# Patient Record
Sex: Female | Born: 1965 | Race: Black or African American | Hispanic: No | Marital: Single | Smoking: Never smoker
Health system: Southern US, Community
[De-identification: ages and names within clinical notes are randomized; demographics above are authoritative.]

## PROBLEM LIST (undated history)

## (undated) DIAGNOSIS — J80 Acute respiratory distress syndrome: Secondary | ICD-10-CM

## (undated) DIAGNOSIS — L8990 Pressure ulcer of unspecified site, unspecified stage: Secondary | ICD-10-CM

## (undated) DIAGNOSIS — Z930 Tracheostomy status: Secondary | ICD-10-CM

## (undated) DIAGNOSIS — Z789 Other specified health status: Secondary | ICD-10-CM

## (undated) DIAGNOSIS — J9601 Acute respiratory failure with hypoxia: Secondary | ICD-10-CM

## (undated) HISTORY — DX: Other specified health status: Z78.9

## (undated) HISTORY — DX: Tracheostomy status: Z93.0

## (undated) HISTORY — DX: Acute respiratory failure with hypoxia: J96.01

## (undated) HISTORY — PX: TRACHEOSTOMY: SUR1362

## (undated) HISTORY — DX: Acute respiratory distress syndrome: J80

---
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
## 2018-10-22 ENCOUNTER — Emergency Department (HOSPITAL_BASED_OUTPATIENT_CLINIC_OR_DEPARTMENT_OTHER): Payer: HRSA Program

## 2018-10-22 ENCOUNTER — Other Ambulatory Visit: Payer: Self-pay

## 2018-10-22 ENCOUNTER — Inpatient Hospital Stay (HOSPITAL_BASED_OUTPATIENT_CLINIC_OR_DEPARTMENT_OTHER)
Admission: EM | Admit: 2018-10-22 | Discharge: 2018-12-09 | DRG: 004 | Disposition: A | Discharge status: Home Health | Payer: HRSA Program | Attending: Internal Medicine | Admitting: Internal Medicine

## 2018-10-22 ENCOUNTER — Encounter (HOSPITAL_BASED_OUTPATIENT_CLINIC_OR_DEPARTMENT_OTHER): Payer: Self-pay

## 2018-10-22 ENCOUNTER — Inpatient Hospital Stay (HOSPITAL_BASED_OUTPATIENT_CLINIC_OR_DEPARTMENT_OTHER): Payer: HRSA Program

## 2018-10-22 DIAGNOSIS — E876 Hypokalemia: Secondary | ICD-10-CM | POA: Diagnosis not present

## 2018-10-22 DIAGNOSIS — E870 Hyperosmolality and hypernatremia: Secondary | ICD-10-CM | POA: Diagnosis not present

## 2018-10-22 DIAGNOSIS — R7989 Other specified abnormal findings of blood chemistry: Secondary | ICD-10-CM | POA: Diagnosis present

## 2018-10-22 DIAGNOSIS — Z95828 Presence of other vascular implants and grafts: Secondary | ICD-10-CM

## 2018-10-22 DIAGNOSIS — R680 Hypothermia, not associated with low environmental temperature: Secondary | ICD-10-CM | POA: Diagnosis not present

## 2018-10-22 DIAGNOSIS — R509 Fever, unspecified: Secondary | ICD-10-CM | POA: Diagnosis not present

## 2018-10-22 DIAGNOSIS — Z930 Tracheostomy status: Secondary | ICD-10-CM | POA: Diagnosis not present

## 2018-10-22 DIAGNOSIS — Z6834 Body mass index (BMI) 34.0-34.9, adult: Secondary | ICD-10-CM

## 2018-10-22 DIAGNOSIS — J80 Acute respiratory distress syndrome: Secondary | ICD-10-CM | POA: Diagnosis not present

## 2018-10-22 DIAGNOSIS — T380X5A Adverse effect of glucocorticoids and synthetic analogues, initial encounter: Secondary | ICD-10-CM | POA: Diagnosis not present

## 2018-10-22 DIAGNOSIS — A419 Sepsis, unspecified organism: Secondary | ICD-10-CM | POA: Diagnosis not present

## 2018-10-22 DIAGNOSIS — G92 Toxic encephalopathy: Secondary | ICD-10-CM | POA: Diagnosis not present

## 2018-10-22 DIAGNOSIS — R451 Restlessness and agitation: Secondary | ICD-10-CM | POA: Diagnosis not present

## 2018-10-22 DIAGNOSIS — Z430 Encounter for attention to tracheostomy: Secondary | ICD-10-CM | POA: Diagnosis not present

## 2018-10-22 DIAGNOSIS — Y92238 Other place in hospital as the place of occurrence of the external cause: Secondary | ICD-10-CM | POA: Diagnosis not present

## 2018-10-22 DIAGNOSIS — D72829 Elevated white blood cell count, unspecified: Secondary | ICD-10-CM | POA: Diagnosis not present

## 2018-10-22 DIAGNOSIS — U071 COVID-19: Secondary | ICD-10-CM

## 2018-10-22 DIAGNOSIS — R140 Abdominal distension (gaseous): Secondary | ICD-10-CM | POA: Diagnosis not present

## 2018-10-22 DIAGNOSIS — Y848 Other medical procedures as the cause of abnormal reaction of the patient, or of later complication, without mention of misadventure at the time of the procedure: Secondary | ICD-10-CM | POA: Diagnosis not present

## 2018-10-22 DIAGNOSIS — J9600 Acute respiratory failure, unspecified whether with hypoxia or hypercapnia: Secondary | ICD-10-CM

## 2018-10-22 DIAGNOSIS — R1110 Vomiting, unspecified: Secondary | ICD-10-CM

## 2018-10-22 DIAGNOSIS — A4189 Other specified sepsis: Secondary | ICD-10-CM | POA: Diagnosis present

## 2018-10-22 DIAGNOSIS — G9340 Encephalopathy, unspecified: Secondary | ICD-10-CM | POA: Diagnosis not present

## 2018-10-22 DIAGNOSIS — K5900 Constipation, unspecified: Secondary | ICD-10-CM | POA: Diagnosis not present

## 2018-10-22 DIAGNOSIS — Z9289 Personal history of other medical treatment: Secondary | ICD-10-CM

## 2018-10-22 DIAGNOSIS — J9601 Acute respiratory failure with hypoxia: Secondary | ICD-10-CM

## 2018-10-22 DIAGNOSIS — R739 Hyperglycemia, unspecified: Secondary | ICD-10-CM | POA: Diagnosis not present

## 2018-10-22 DIAGNOSIS — R1312 Dysphagia, oropharyngeal phase: Secondary | ICD-10-CM | POA: Diagnosis not present

## 2018-10-22 DIAGNOSIS — J069 Acute upper respiratory infection, unspecified: Secondary | ICD-10-CM

## 2018-10-22 DIAGNOSIS — I9589 Other hypotension: Secondary | ICD-10-CM | POA: Diagnosis not present

## 2018-10-22 DIAGNOSIS — I952 Hypotension due to drugs: Secondary | ICD-10-CM | POA: Diagnosis not present

## 2018-10-22 DIAGNOSIS — R5381 Other malaise: Secondary | ICD-10-CM | POA: Diagnosis not present

## 2018-10-22 DIAGNOSIS — Z978 Presence of other specified devices: Secondary | ICD-10-CM

## 2018-10-22 DIAGNOSIS — R1310 Dysphagia, unspecified: Secondary | ICD-10-CM

## 2018-10-22 DIAGNOSIS — I480 Paroxysmal atrial fibrillation: Secondary | ICD-10-CM | POA: Diagnosis not present

## 2018-10-22 DIAGNOSIS — J1289 Other viral pneumonia: Secondary | ICD-10-CM | POA: Diagnosis present

## 2018-10-22 DIAGNOSIS — J9501 Hemorrhage from tracheostomy stoma: Secondary | ICD-10-CM | POA: Diagnosis not present

## 2018-10-22 DIAGNOSIS — D696 Thrombocytopenia, unspecified: Secondary | ICD-10-CM | POA: Diagnosis not present

## 2018-10-22 DIAGNOSIS — I959 Hypotension, unspecified: Secondary | ICD-10-CM | POA: Diagnosis not present

## 2018-10-22 DIAGNOSIS — R0902 Hypoxemia: Secondary | ICD-10-CM

## 2018-10-22 DIAGNOSIS — J1282 Pneumonia due to coronavirus disease 2019: Secondary | ICD-10-CM

## 2018-10-22 DIAGNOSIS — R945 Abnormal results of liver function studies: Secondary | ICD-10-CM | POA: Diagnosis not present

## 2018-10-22 DIAGNOSIS — Z4659 Encounter for fitting and adjustment of other gastrointestinal appliance and device: Secondary | ICD-10-CM

## 2018-10-22 DIAGNOSIS — S01512A Laceration without foreign body of oral cavity, initial encounter: Secondary | ICD-10-CM | POA: Diagnosis not present

## 2018-10-22 DIAGNOSIS — L8990 Pressure ulcer of unspecified site, unspecified stage: Secondary | ICD-10-CM

## 2018-10-22 DIAGNOSIS — G9341 Metabolic encephalopathy: Secondary | ICD-10-CM | POA: Diagnosis not present

## 2018-10-22 DIAGNOSIS — E669 Obesity, unspecified: Secondary | ICD-10-CM | POA: Diagnosis present

## 2018-10-22 DIAGNOSIS — R6520 Severe sepsis without septic shock: Secondary | ICD-10-CM | POA: Diagnosis not present

## 2018-10-22 DIAGNOSIS — E871 Hypo-osmolality and hyponatremia: Secondary | ICD-10-CM | POA: Diagnosis not present

## 2018-10-22 DIAGNOSIS — J988 Other specified respiratory disorders: Secondary | ICD-10-CM | POA: Diagnosis not present

## 2018-10-22 DIAGNOSIS — T4275XA Adverse effect of unspecified antiepileptic and sedative-hypnotic drugs, initial encounter: Secondary | ICD-10-CM | POA: Diagnosis not present

## 2018-10-22 DIAGNOSIS — R9431 Abnormal electrocardiogram [ECG] [EKG]: Secondary | ICD-10-CM | POA: Diagnosis not present

## 2018-10-22 DIAGNOSIS — Z452 Encounter for adjustment and management of vascular access device: Secondary | ICD-10-CM

## 2018-10-22 DIAGNOSIS — J151 Pneumonia due to Pseudomonas: Secondary | ICD-10-CM | POA: Diagnosis not present

## 2018-10-22 DIAGNOSIS — T17908A Unspecified foreign body in respiratory tract, part unspecified causing other injury, initial encounter: Secondary | ICD-10-CM

## 2018-10-22 HISTORY — DX: COVID-19: U07.1

## 2018-10-22 LAB — CBC WITH DIFFERENTIAL/PLATELET
Abs Immature Granulocytes: 0.25 10*3/uL — ABNORMAL HIGH (ref 0.00–0.07)
Basophils Absolute: 0 10*3/uL (ref 0.0–0.1)
Basophils Relative: 0 %
Eosinophils Absolute: 0 10*3/uL (ref 0.0–0.5)
Eosinophils Relative: 0 %
HCT: 45.5 % (ref 36.0–46.0)
Hemoglobin: 13.7 g/dL (ref 12.0–15.0)
Immature Granulocytes: 2 %
Lymphocytes Relative: 7 %
Lymphs Abs: 1.2 10*3/uL (ref 0.7–4.0)
MCH: 27.4 pg (ref 26.0–34.0)
MCHC: 30.1 g/dL (ref 30.0–36.0)
MCV: 91 fL (ref 80.0–100.0)
Monocytes Absolute: 0.8 10*3/uL (ref 0.1–1.0)
Monocytes Relative: 5 %
Neutro Abs: 14.2 10*3/uL — ABNORMAL HIGH (ref 1.7–7.7)
Neutrophils Relative %: 86 %
Platelets: 256 10*3/uL (ref 150–400)
RBC: 5 MIL/uL (ref 3.87–5.11)
RDW: 15.3 % (ref 11.5–15.5)
WBC: 16.4 10*3/uL — ABNORMAL HIGH (ref 4.0–10.5)
nRBC: 0 % (ref 0.0–0.2)

## 2018-10-22 LAB — POCT I-STAT 7, (LYTES, BLD GAS, ICA,H+H)
Acid-Base Excess: 4 mmol/L — ABNORMAL HIGH (ref 0.0–2.0)
Bicarbonate: 27 mmol/L (ref 20.0–28.0)
Calcium, Ion: 1.11 mmol/L — ABNORMAL LOW (ref 1.15–1.40)
HCT: 40 % (ref 36.0–46.0)
Hemoglobin: 13.6 g/dL (ref 12.0–15.0)
O2 Saturation: 100 %
Potassium: 3.5 mmol/L (ref 3.5–5.1)
Sodium: 137 mmol/L (ref 135–145)
TCO2: 28 mmol/L (ref 22–32)
pCO2 arterial: 36.1 mmHg (ref 32.0–48.0)
pH, Arterial: 7.482 — ABNORMAL HIGH (ref 7.350–7.450)
pO2, Arterial: 246 mmHg — ABNORMAL HIGH (ref 83.0–108.0)

## 2018-10-22 LAB — URINALYSIS, ROUTINE W REFLEX MICROSCOPIC
Glucose, UA: NEGATIVE mg/dL
Ketones, ur: NEGATIVE mg/dL
Leukocytes,Ua: NEGATIVE
Nitrite: NEGATIVE
Protein, ur: 100 mg/dL — AB
Specific Gravity, Urine: >1.03 — ABNORMAL HIGH (ref 1.005–1.030)
pH: 5.5 (ref 5.0–8.0)

## 2018-10-22 LAB — URINALYSIS, MICROSCOPIC (REFLEX)

## 2018-10-22 LAB — COMPREHENSIVE METABOLIC PANEL
ALT: 58 U/L — ABNORMAL HIGH (ref 0–44)
AST: 112 U/L — ABNORMAL HIGH (ref 15–41)
Albumin: 3.2 g/dL — ABNORMAL LOW (ref 3.5–5.0)
Alkaline Phosphatase: 203 U/L — ABNORMAL HIGH (ref 38–126)
Anion gap: 11 (ref 5–15)
BUN: 23 mg/dL — ABNORMAL HIGH (ref 6–20)
CO2: 26 mmol/L (ref 22–32)
Calcium: 8.7 mg/dL — ABNORMAL LOW (ref 8.9–10.3)
Chloride: 98 mmol/L (ref 98–111)
Creatinine, Ser: 0.8 mg/dL (ref 0.44–1.00)
GFR calc Af Amer: >60 mL/min (ref >60)
GFR calc non Af Amer: >60 mL/min (ref >60)
Glucose, Bld: 156 mg/dL — ABNORMAL HIGH (ref 70–99)
Potassium: 3.8 mmol/L (ref 3.5–5.1)
Sodium: 135 mmol/L (ref 135–145)
Total Bilirubin: 0.5 mg/dL (ref 0.3–1.2)
Total Protein: 8.4 g/dL — ABNORMAL HIGH (ref 6.5–8.1)

## 2018-10-22 LAB — SARS CORONAVIRUS 2 AG (30 MIN TAT): SARS Coronavirus 2 Ag: POSITIVE — AB

## 2018-10-22 LAB — PREGNANCY, URINE: Preg Test, Ur: NEGATIVE

## 2018-10-22 LAB — TROPONIN I: Troponin I: 0.04 ng/mL (ref <0.03)

## 2018-10-22 LAB — LIPASE, BLOOD: Lipase: 26 U/L (ref 11–51)

## 2018-10-22 MED ORDER — ASPIRIN 300 MG RE SUPP
300.0000 mg | Freq: Every day | RECTAL | Status: DC
  Administered 2018-10-22: 300 mg via RECTAL
  Filled 2018-10-22: qty 1

## 2018-10-22 MED ORDER — MIDAZOLAM HCL 5 MG/5ML IJ SOLN
2.0000 mg | INTRAMUSCULAR | Status: DC | PRN
  Administered 2018-10-22 (×2): 2 mg via INTRAVENOUS

## 2018-10-22 MED ORDER — ETOMIDATE 2 MG/ML IV SOLN
20.0000 mg | Freq: Once | INTRAVENOUS | Status: DC
  Filled 2018-10-22: qty 10

## 2018-10-22 MED ORDER — VITAMIN C 500 MG PO TABS
500.0000 mg | ORAL_TABLET | Freq: Every day | ORAL | Status: DC
  Filled 2018-10-22: qty 1

## 2018-10-22 MED ORDER — FAMOTIDINE 40 MG/5ML PO SUSR
20.0000 mg | Freq: Two times a day (BID) | ORAL | Status: DC
  Administered 2018-10-23 – 2018-11-20 (×58): 20 mg
  Filled 2018-10-22 (×60): qty 2.5

## 2018-10-22 MED ORDER — PROPOFOL 1000 MG/100ML IV EMUL
5.0000 ug/kg/min | INTRAVENOUS | Status: DC
  Administered 2018-10-22: 15 ug/kg/min via INTRAVENOUS
  Administered 2018-10-22: 50 ug/kg/min via INTRAVENOUS
  Administered 2018-10-22: 45 ug/kg/min via INTRAVENOUS
  Filled 2018-10-22: qty 100
  Filled 2018-10-22: qty 200
  Filled 2018-10-22 (×2): qty 100

## 2018-10-22 MED ORDER — SODIUM CHLORIDE 0.9 % IV BOLUS
500.0000 mL | Freq: Once | INTRAVENOUS | Status: AC
  Administered 2018-10-22: 500 mL via INTRAVENOUS

## 2018-10-22 MED ORDER — SODIUM CHLORIDE 0.9% FLUSH
3.0000 mL | Freq: Two times a day (BID) | INTRAVENOUS | Status: DC
  Administered 2018-10-22 – 2018-11-15 (×31): 3 mL via INTRAVENOUS
  Administered 2018-11-15: 10 mL via INTRAVENOUS
  Administered 2018-11-16 – 2018-11-24 (×7): 3 mL via INTRAVENOUS

## 2018-10-22 MED ORDER — ZINC SULFATE 220 (50 ZN) MG PO CAPS
220.0000 mg | ORAL_CAPSULE | Freq: Every day | ORAL | Status: DC
  Filled 2018-10-22: qty 1

## 2018-10-22 MED ORDER — MIDAZOLAM HCL 5 MG/5ML IJ SOLN
INTRAMUSCULAR | Status: AC
  Administered 2018-10-22: 2 mg via INTRAVENOUS
  Filled 2018-10-22: qty 5

## 2018-10-22 MED ORDER — SODIUM CHLORIDE 0.9% FLUSH
3.0000 mL | Freq: Two times a day (BID) | INTRAVENOUS | Status: DC
  Administered 2018-10-22: 3 mL via INTRAVENOUS

## 2018-10-22 MED ORDER — CHLORHEXIDINE GLUCONATE 0.12% ORAL RINSE (MEDLINE KIT)
15.0000 mL | Freq: Two times a day (BID) | OROMUCOSAL | Status: DC
  Administered 2018-10-23 – 2018-10-27 (×10): 15 mL via OROMUCOSAL

## 2018-10-22 MED ORDER — ENOXAPARIN SODIUM 40 MG/0.4ML ~~LOC~~ SOLN
40.0000 mg | Freq: Two times a day (BID) | SUBCUTANEOUS | Status: DC
  Administered 2018-10-22 – 2018-11-12 (×42): 40 mg via SUBCUTANEOUS
  Filled 2018-10-22 (×41): qty 0.4

## 2018-10-22 MED ORDER — ETOMIDATE 2 MG/ML IV SOLN
30.0000 mg | Freq: Once | INTRAVENOUS | Status: AC
  Administered 2018-10-22: 30 mg via INTRAVENOUS

## 2018-10-22 MED ORDER — DOCUSATE SODIUM 50 MG/5ML PO LIQD: 100.0000 mg | Freq: Two times a day (BID) | ORAL | Status: DC | PRN

## 2018-10-22 MED ORDER — SODIUM CHLORIDE 0.9 % IV SOLN
500.0000 mg | INTRAVENOUS | Status: AC
  Administered 2018-10-23 – 2018-10-26 (×5): 500 mg via INTRAVENOUS
  Filled 2018-10-22 (×6): qty 500

## 2018-10-22 MED ORDER — SODIUM CHLORIDE 0.9 % IV SOLN
1.0000 g | INTRAVENOUS | Status: DC
  Administered 2018-10-22 – 2018-10-26 (×5): 1 g via INTRAVENOUS
  Filled 2018-10-22 (×3): qty 1
  Filled 2018-10-22 (×2): qty 10

## 2018-10-22 MED ORDER — BISACODYL 10 MG RE SUPP
10.0000 mg | Freq: Every day | RECTAL | Status: DC | PRN
  Filled 2018-10-22: qty 1

## 2018-10-22 MED ORDER — FENTANYL CITRATE (PF) 100 MCG/2ML IJ SOLN
50.0000 ug | INTRAMUSCULAR | Status: DC | PRN
  Administered 2018-10-26: 10:00:00 50 ug via INTRAVENOUS
  Administered 2018-10-30 – 2018-11-08 (×12): 100 ug via INTRAVENOUS
  Filled 2018-10-22 (×12): qty 2

## 2018-10-22 MED ORDER — PROPOFOL 1000 MG/100ML IV EMUL
0.0000 ug/kg/min | INTRAVENOUS | Status: DC
  Administered 2018-10-23 – 2018-10-24 (×7): 50 ug/kg/min via INTRAVENOUS
  Administered 2018-10-24 – 2018-10-25 (×3): 25 ug/kg/min via INTRAVENOUS
  Administered 2018-10-26: 10 ug/kg/min via INTRAVENOUS
  Administered 2018-10-27: 15 ug/kg/min via INTRAVENOUS
  Administered 2018-10-28: 7.331 ug/kg/min via INTRAVENOUS
  Administered 2018-10-28: 20 ug/kg/min via INTRAVENOUS
  Administered 2018-10-28: 14.663 ug/kg/min via INTRAVENOUS
  Administered 2018-10-29: 5 ug/kg/min via INTRAVENOUS
  Administered 2018-10-29: 23.38 ug/kg/min via INTRAVENOUS
  Administered 2018-10-30: 18:00:00 24.215 ug/kg/min via INTRAVENOUS
  Administered 2018-10-30 – 2018-10-31 (×2): 30 ug/kg/min via INTRAVENOUS
  Filled 2018-10-22 (×22): qty 100

## 2018-10-22 MED ORDER — SODIUM CHLORIDE 0.9% FLUSH: 3.0000 mL | INTRAVENOUS | Status: DC | PRN

## 2018-10-22 MED ORDER — TOCILIZUMAB 400 MG/20ML IV SOLN
800.0000 mg | Freq: Once | INTRAVENOUS | Status: AC
  Administered 2018-10-23: 800 mg via INTRAVENOUS
  Filled 2018-10-22: qty 40

## 2018-10-22 MED ORDER — MIDAZOLAM HCL 2 MG/2ML IJ SOLN: 2.0000 mg | INTRAMUSCULAR | Status: DC | PRN

## 2018-10-22 MED ORDER — ROCURONIUM BROMIDE 50 MG/5ML IV SOLN
100.0000 mg | Freq: Once | INTRAVENOUS | Status: AC
  Administered 2018-10-22: 100 mg via INTRAVENOUS

## 2018-10-22 MED ORDER — ORAL CARE MOUTH RINSE
15.0000 mL | OROMUCOSAL | Status: DC
  Administered 2018-10-23 – 2018-11-10 (×178): 15 mL via OROMUCOSAL

## 2018-10-22 MED ORDER — NOREPINEPHRINE BITARTRATE 1 MG/ML IV SOLN
0.0000 ug/min | INTRAVENOUS | Status: DC
  Filled 2018-10-22: qty 4

## 2018-10-22 MED ORDER — VITAMIN C 500 MG PO TABS
500.0000 mg | ORAL_TABLET | Freq: Every day | ORAL | Status: DC
  Administered 2018-10-23 – 2018-11-03 (×13): 500 mg
  Filled 2018-10-22 (×14): qty 1

## 2018-10-22 MED ORDER — SODIUM CHLORIDE 0.9 % IV SOLN: 250.0000 mL | INTRAVENOUS | Status: DC | PRN

## 2018-10-22 MED ORDER — ORAL CARE MOUTH RINSE
15.0000 mL | OROMUCOSAL | Status: DC
  Administered 2018-10-22: 15 mL via OROMUCOSAL

## 2018-10-22 MED ORDER — MIDAZOLAM HCL 2 MG/2ML IJ SOLN
2.0000 mg | INTRAMUSCULAR | Status: DC | PRN
  Administered 2018-10-23: 2 mg via INTRAVENOUS
  Administered 2018-10-23: 1 mg via INTRAVENOUS
  Administered 2018-10-23 – 2018-11-10 (×18): 2 mg via INTRAVENOUS
  Filled 2018-10-22 (×21): qty 2

## 2018-10-22 MED ORDER — ZINC SULFATE 220 (50 ZN) MG PO CAPS
220.0000 mg | ORAL_CAPSULE | Freq: Every day | ORAL | Status: DC
  Administered 2018-10-23 – 2018-11-03 (×13): 220 mg
  Filled 2018-10-22 (×14): qty 1

## 2018-10-22 MED ORDER — NOREPINEPHRINE 4 MG/250ML-% IV SOLN
0.0000 ug/min | INTRAVENOUS | Status: DC
  Administered 2018-10-24 – 2018-10-30 (×3): 2 ug/min via INTRAVENOUS
  Administered 2018-10-31: 4.5 ug/min via INTRAVENOUS
  Administered 2018-11-01 – 2018-11-02 (×2): 3 ug/min via INTRAVENOUS
  Administered 2018-11-03: 1 ug/min via INTRAVENOUS
  Administered 2018-11-06: 01:00:00 2 ug/min via INTRAVENOUS
  Administered 2018-11-06: 1 ug/min via INTRAVENOUS
  Administered 2018-11-11: 17:00:00 6 ug/min via INTRAVENOUS
  Administered 2018-11-11: 5 ug/min via INTRAVENOUS
  Filled 2018-10-22 (×10): qty 250

## 2018-10-22 MED ORDER — CHLORHEXIDINE GLUCONATE 0.12% ORAL RINSE (MEDLINE KIT)
15.0000 mL | Freq: Two times a day (BID) | OROMUCOSAL | Status: DC
  Administered 2018-10-22: 15 mL via OROMUCOSAL

## 2018-10-22 MED ORDER — FAMOTIDINE IN NACL 20-0.9 MG/50ML-% IV SOLN
20.0000 mg | Freq: Two times a day (BID) | INTRAVENOUS | Status: DC
  Administered 2018-10-23: 20 mg via INTRAVENOUS
  Filled 2018-10-22: qty 50

## 2018-10-22 NOTE — ED Notes (Signed)
Pt 02 sat 60 on RA-started on NRB-RT and EDP at BS-sats up to mid 80s

## 2018-10-22 NOTE — ED Notes (Signed)
A [facility] RN spoke with daughter-she states pt has no hx no allergies no meds-EDP out to ED WR to speak to daughter

## 2018-10-22 NOTE — ED Notes (Signed)
Attempted IV without success. Pt has poor venous access. EDP notified and verbal order received to place EJ.

## 2018-10-22 NOTE — ED Triage Notes (Signed)
Pt with c/o weakness, fever, slight cough, SOB-sx started 5/15-pt appears DOE with any exertion-assisted from car to w/c to tx area

## 2018-10-22 NOTE — H&P (Addendum)
History and Physical    Shannon Meyer AVW:[id number] DOB: [birth year] DOA: 10/22/2018  PCP: Patient, No Pcp Per  Patient coming from: Home  Chief Complaint: Shortness of breath not eating well  HPI: Shannon Meyer is a 53 y.o. female with medical history significant of nothing brought to [facility] urgent care with complaints of fever coughing shortness of breath symptoms since 515 with dyspnea on exertion.  Patient has not been eating well.  Patient was found to be tachypneic and hypoxic and was emergently intubated at the urgent care.  Her COVID-19 testing is positive.  Patient is currently sedated and intubated and is arrived to [facility] campus for acute respiratory failure secondary to COVID infection.  All history obtained from records from ED staff.  Does not appear patient sought any medical attention prior to today.   Review of Systems: Unobtainable secondary to intubation  History reviewed. No pertinent past medical history.  None noted  History reviewed. No pertinent surgical history.  Noted   reports that she has never smoked. She has never used smokeless tobacco. She reports previous alcohol use. She reports previous drug use.  None noted  No Known Allergies  No family history on file.  Unknown secondary to sedation  Prior to Admission medications   Not on File  Unknown  Physical Exam: Vitals:   10/22/18 2000 10/22/18 2005 10/22/18 2010 10/22/18 2015  BP: 109/68 (!) 118/106 121/75 126/78  Pulse: 90 86 89 84  Resp: (!) 24 19 (!) 24 19  Temp:      TempSrc:      SpO2: 100% 98% 100% 100%  Weight:      Height:          Constitutional: NAD, calm, comfortable sedated and intubated Vitals:   10/22/18 2000 10/22/18 2005 10/22/18 2010 10/22/18 2015  BP: 109/68 (!) 118/106 121/75 126/78  Pulse: 90 86 89 84  Resp: (!) 24 19 (!) 24 19  Temp:      TempSrc:      SpO2: 100% 98% 100% 100%  Weight:      Height:       Eyes: PERRL, lids and conjunctivae  normal ENMT: Mucous membranes are moist. Posterior pharynx clear of any exudate or lesions.Normal dentition.  Neck: normal, supple, no masses, no thyromegaly Respiratory: clear to auscultation bilaterally, no wheezing, no crackles. Normal respiratory effort. No accessory muscle use.  Cardiovascular: Regular rate and rhythm, no murmurs / rubs / gallops. No extremity edema. 2+ pedal pulses. No carotid bruits.  Abdomen: no tenderness, no masses palpated. No hepatosplenomegaly. Bowel sounds positive.  Musculoskeletal: no clubbing / cyanosis. No joint deformity upper and lower extremities. Good ROM, no contractures. Normal muscle tone.  Skin: no rashes, lesions, ulcers. No induration Neurologic: Moves all extremities.  Comfortable on ventilator.  Sedated. Psychiatric: Comfortable on vent sedated and intubated   Labs on Admission: I have personally reviewed following labs and imaging studies  CBC: Recent Labs  Lab 10/22/18 1611 10/22/18 1744  WBC 16.4*  --   NEUTROABS 14.2*  --   HGB 13.7 13.6  HCT 45.5 40.0  MCV 91.0  --   PLT 256  --    Basic Metabolic Panel: Recent Labs  Lab 10/22/18 1612 10/22/18 1744  NA 135 137  K 3.8 3.5  CL 98  --   CO2 26  --   GLUCOSE 156*  --   BUN 23*  --   CREATININE 0.80  --   CALCIUM  8.7*  --    GFR: Estimated Creatinine Clearance: 91.2 mL/min (by C-G formula based on SCr of 0.8 mg/dL). Liver Function Tests: Recent Labs  Lab 10/22/18 1612  AST 112*  ALT 58*  ALKPHOS 203*  BILITOT 0.5  PROT 8.4*  ALBUMIN 3.2*   Recent Labs  Lab 10/22/18 1612  LIPASE 26   No results for input(s): AMMONIA in the last 168 hours. Coagulation Profile: No results for input(s): INR, PROTIME in the last 168 hours. Cardiac Enzymes: Recent Labs  Lab 10/22/18 1625  TROPONINI 0.04*   BNP (last 3 results) No results for input(s): PROBNP in the last 8760 hours. HbA1C: No results for input(s): HGBA1C in the last 72 hours. CBG: No results for input(s):  GLUCAP in the last 168 hours. Lipid Profile: No results for input(s): CHOL, HDL, LDLCALC, TRIG, CHOLHDL, LDLDIRECT in the last 72 hours. Thyroid Function Tests: No results for input(s): TSH, T4TOTAL, FREET4, T3FREE, THYROIDAB in the last 72 hours. Anemia Panel: No results for input(s): VITAMINB12, FOLATE, FERRITIN, TIBC, IRON, RETICCTPCT in the last 72 hours. Urine analysis:    Component Value Date/Time   COLORURINE AMBER (A) 10/22/2018 1715   APPEARANCEUR CLEAR 10/22/2018 1715   LABSPEC >1.030 (H) 10/22/2018 1715   PHURINE 5.5 10/22/2018 1715   GLUCOSEU NEGATIVE 10/22/2018 1715   HGBUR LARGE (A) 10/22/2018 1715   BILIRUBINUR SMALL (A) 10/22/2018 1715   KETONESUR NEGATIVE 10/22/2018 1715   PROTEINUR 100 (A) 10/22/2018 1715   NITRITE NEGATIVE 10/22/2018 1715   LEUKOCYTESUR NEGATIVE 10/22/2018 1715   Sepsis Labs: !!!!!!!!!!!!!!!!!!!!!!!!!!!!!!!!!!!!!!!!!!!! @LABRCNTIP (procalcitonin:4,lacticidven:4) ) Recent Results (from the past 240 hour(s))  SARS Coronavirus 2 (Hosp order,Performed in [facility] lab via [employer] ID)     Status: Abnormal   Collection Time: 10/22/18  4:16 PM  Result Value Ref Range Status   SARS Coronavirus 2 ([employer] ID Now) POSITIVE (A) NEGATIVE Final    Comment: RESULT CALLED TO, READ BACK BY AND VERIFIED WITH: CALLED TO S.GOUGE RN AT 1650 BY SROY ON [id number] (NOTE) Interpretive Result Comment(s): COVID 19 Positive SARS CoV 2 target nucleic acids are DETECTED. The SARS CoV 2 RNA is generally detectable in upper and lower respiratory specimens during the acute phase of infection.  Positive results are indicative of active infection with SARS CoV 2.  Clinical correlation with patient history and other diagnostic information is necessary to determine patient infection status.  Positive results do not rule out bacterial infection or coinfection with other viruses. The expected result is Negative. COVID 19 Negative SARS CoV 2 target nucleic acids are NOT DETECTED.  The SARS CoV 2 RNA is generally detectable in upper and lower respiratory specimens during the acute phase of infection.  Negative results do not preclude SARS CoV 2 infection, do not rule out coinfections with other pathogens, and should not be used as the sole basis for  treatment or other patient management decisions.  Negative results must be combined with clinical observations, patient history, and epidemiological information. The expected result is Negative. Invalid Presence or absence of SARS CoV 2 nucleic acids cannot be determined. Repeat testing was performed on the submitted specimen and repeated Invalid results were obtained.  If clinically indicated, additional testing on a new specimen with an alternate test methodology [phone] is advised.  The SARS CoV 2 RNA is generally detectable in upper and lower respiratory specimens during the acute phase of infection. The expected result is Negative. Fact Sheet for Patients:  http://www.graves-ford.org/https://www.fda.gov/media/136524/download Fact Sheet for Healthcare Providers: EnviroConcern.sihttps://www.fda.gov/media/136523/download This  test is not yet approved or cleared by the [place] and has been authorized for detection and/or diagnosis of SARS CoV 2 by FDA under an Emergency Use Authorization (EUA).   This EUA will remain in effect (meaning this test can be used) for the duration of the COVID19 declaration under Section 564(b)(1) of the Act, 21 U.S.C. section 605 068 5002 3(b)(1), unless the authorization is terminated or revoked sooner. Performed at [facility], [address] [place], [place] [address]      Radiological Exams on Admission: Dg Chest Portable 1 View  Result Date: 10/22/2018 CLINICAL DATA:  Tube placement EXAM: PORTABLE CHEST 1 VIEW COMPARISON:  10/22/2018 FINDINGS: The endotracheal tube terminates above the carina by approximately 2.7 cm. The enteric tube extends below the left hemidiaphragm. The cardiac silhouette is  mildly enlarged. There are increasing perihilar airspace opacities and prominent interstitial lung markings. There is a worsening retrocardiac/left basilar airspace opacity. IMPRESSION: 1. Lines and tubes as above. 2. Worsening perihilar airspace opacities and prominent interstitial lung markings can be seen in patients with atypical pneumonia. 3. Worsening retrocardiac/left basilar airspace opacity concerning for aspiration or developing consolidation. Electronically Signed   By: Katherine Mantle M.D.   On: 10/22/2018 19:02   Dg Chest Portable 1 View  Result Date: 10/22/2018 CLINICAL DATA:  Fever, cough. EXAM: PORTABLE CHEST 1 VIEW COMPARISON:  None. FINDINGS: The heart size and mediastinal contours are within normal limits. Both lungs are clear. The visualized skeletal structures are unremarkable. IMPRESSION: No active disease. Electronically Signed   By: Lupita Raider M.D.   On: 10/22/2018 16:46    EKG: Independently reviewed.  Sinus tachycardia with prolonged QTC no acute changes Chart reviewed Chest x-ray reviewed initial shows no active disease repeat chest x-ray after intubation shows beginnings of peri-healer bilateral opacities  Assessment/Plan 53 year old female presents with acute hypoxic respiratory failure secondary to COVID-19 infection Principal Problem:   Acute respiratory disease due to COVID-19 virus-ventilatory support.  Currently on 100% FiO2 with a PEEP of 10 respiratory rate of 24 tidal volume on 330.  Critical care consulted.  Due to leukocytosis will cover empirically with azithromycin and Rocephin obtain blood cultures.  Will check procalcitonin level and other inflammatory markers including lactic acid level.  Also give one-time loading dose of Actemra.  Placed on contact and airborne precautions.  Will alert critical care patient is here for consultation.  Further recommendation being on over hospital course and response to treatment for COVID-19 and pulmonary  recommendations.  Placed on Lovenox prophylactic dosing twice daily until d-dimer back.  Monitor d-dimer closely.  Also placed on vitamin C zinc and aspirin.  Active Problems:   Sepsis (HCC)-patient has some soft blood pressures at urgent care Levophed was ordered but on think it was actually started.  Order has been placed in case she does needed to maintain map above 65.  Again covering empirically for bacterial component until cultures negative.  Likely all secondary to viral infection.    Leukocytosis-as above.  Check pro-Cal.    Elevated LFTs-mildly bumped.  Continue to monitor.  Abdominal exam is soft.    QT prolongation-monitor closely and try to minimize QT prolonging agents.     DVT prophylaxis: Lovenox Code Status: Full Family Communication: None Disposition Plan: Days Consults called: PCCM Admission status: Admission   DAVID,RACHAL A MD [facility]  If 7PM-7AM, please contact night-coverage [email or web address] Password [facility]  10/22/2018, 9:52 PM   Critical care time 45 minutes

## 2018-10-22 NOTE — ED Notes (Signed)
Care turned over to [facility] at this time.  

## 2018-10-22 NOTE — ED Notes (Addendum)
ET tube 7.5 with 23 cm at the R lip. Lung sounds absent over epigastrium, present, clear, and equal over lungs.

## 2018-10-22 NOTE — ED Notes (Signed)
EDP notified of pt's V/S see new orders.

## 2018-10-22 NOTE — Progress Notes (Addendum)
NAME:  Shannon Meyer, MRN:  [id number], DOB:  1965/06/14, LOS: 0 ADMISSION DATE:  10/22/2018, CONSULTATION DATE:  10/22/2018 REFERRING MD:  Dr. Pilar Plate, ER, CHIEF COMPLAINT:  Short of breath   Brief History   53 yo female presented to [facility] ER with weakness, fever, cough, and dyspnea for 5 days prior to admission.  Noted to have hypoxia with b/l perihilar/interstitial pulmonary infiltrates on CXR.  COVID positive.  Required intubation prior to transfer to [facility].  No significant past medical history.  Significant Hospital Events   5/20 Admit, start actemra  Consults:    Procedures:    Significant Diagnostic Tests:    Micro Data:  COVID 5/20 >> Positive Blood 5/20 >>  Antimicrobials:  Zithromax 5/20 >>  Rocephin 5/20 >>   Interim history/subjective:    Objective   Blood pressure 112/69, pulse 81, temperature 98.1 F (36.7 C), temperature source Oral, resp. rate (!) 24, height 5\' 4"  (1.626 m), weight 99.8 kg, SpO2 98 %.    Vent Mode: PRVC FiO2 (%):  [70 %-100 %] 70 % Set Rate:  [20 bmp-24 bmp] 24 bmp Vt Set:  [330 mL-450 mL] 330 mL PEEP:  [10 cmH20] 10 cmH20 Plateau Pressure:  [24 cmH20-30 cmH20] 24 cmH20   Intake/Output Summary (Last 24 hours) at 10/22/2018 2311 Last data filed at 10/22/2018 2018 Gross per 24 hour  Intake 952.43 ml  Output 400 ml  Net 552.43 ml   Filed Weights   10/22/18 1609  Weight: 99.8 kg    Examination:  General - sedated Eyes - pupils reactive ENT - ETT in place Cardiac - regular rate/rhythm, no murmur Chest - b/l rhonchi Abdomen - soft, non tender, + bowel sounds Extremities - no cyanosis, clubbing, or edema Skin - no rashes Neuro - RASS -2 Lymphatics - no lymphadenopathy GU - no lesions noted  CXR (reviewed by me) - perihilar and interstitial infiltrates b/l    Resolved Hospital Problem list     Assessment & Plan:   Acute hypoxic respiratory failure with ARDS from COVID pneumonia. Plan - full vent support - goal SpO2 88 to  95% - continue rocephin, zithromax - continue actemira - vitamin C, zinc  Elevated LFTs. Plan - f/u labs   Best practice:  Diet: NPO DVT prophylaxis: Lovenox GI prophylaxis: Pepcid Mobility: Bed rest Code Status: Full Disposition: ICU  Labs   CBC: Recent Labs  Lab 10/22/18 1611 10/22/18 1744  WBC 16.4*  --   NEUTROABS 14.2*  --   HGB 13.7 13.6  HCT 45.5 40.0  MCV 91.0  --   PLT 256  --     Basic Metabolic Panel: Recent Labs  Lab 10/22/18 1612 10/22/18 1744  NA 135 137  K 3.8 3.5  CL 98  --   CO2 26  --   GLUCOSE 156*  --   BUN 23*  --   CREATININE 0.80  --   CALCIUM 8.7*  --    GFR: Estimated Creatinine Clearance: 91.2 mL/min (by C-G formula based on SCr of 0.8 mg/dL). Recent Labs  Lab 10/22/18 1611  WBC 16.4*    Liver Function Tests: Recent Labs  Lab 10/22/18 1612  AST 112*  ALT 58*  ALKPHOS 203*  BILITOT 0.5  PROT 8.4*  ALBUMIN 3.2*   Recent Labs  Lab 10/22/18 1612  LIPASE 26   No results for input(s): AMMONIA in the last 168 hours.  ABG    Component Value Date/Time   PHART 7.482 (H) 10/22/2018  1744   PCO2ART 36.1 10/22/2018 1744   PO2ART 246.0 (H) 10/22/2018 1744   HCO3 27.0 10/22/2018 1744   TCO2 28 10/22/2018 1744   O2SAT 100.0 10/22/2018 1744     Coagulation Profile: No results for input(s): INR, PROTIME in the last 168 hours.  Cardiac Enzymes: Recent Labs  Lab 10/22/18 1625  TROPONINI 0.04*    HbA1C: No results found for: HGBA1C  CBG: No results for input(s): GLUCAP in the last 168 hours.  Review of Systems:   Unable to obtain.  Past Medical History  She,  has no past medical history on file.   Surgical History   History reviewed. No pertinent surgical history.   Social History   reports that she has never smoked. She has never used smokeless tobacco. She reports previous alcohol use. She reports previous drug use.   Family History   Unable to obtain.   Allergies No Known Allergies   Home  Medications  Prior to Admission medications   Not on File     Critical care time: 33 minutes    Coralyn HellingVineet Ivory Bail, MD [facility] 10/22/2018, 11:53 PM

## 2018-10-22 NOTE — ED Provider Notes (Signed)
[facility] Emergency Department Provider Note MRN:  [id number]  Arrival date & time: 10/22/18     Chief Complaint   Weakness   History of Present Illness   Shannon Meyer is a 53 y.o. year-old female with no pertinent past medical history presenting to the ED with chief complaint of weakness.  5 days of progressive weakness, mild cough, report of fever, decreased energy level, staying in bed, not interested in eating according to family.  Profoundly hypoxic and decreased responsiveness on arrival.  Review of Systems  Positive for weakness, cough, fever, hypoxia.  Patient's Health History   History reviewed. No pertinent past medical history.  History reviewed. No pertinent surgical history.  No family history on file.  Social History   Socioeconomic History  . Marital status: Single    Spouse name: Not on file  . Number of children: Not on file  . Years of education: Not on file  . Highest education level: Not on file  Occupational History  . Not on file  Social Needs  . Financial resource strain: Not on file  . Food insecurity:    Worry: Not on file    Inability: Not on file  . Transportation needs:    Medical: Not on file    Non-medical: Not on file  Tobacco Use  . Smoking status: Never Smoker  . Smokeless tobacco: Never Used  Substance and Sexual Activity  . Alcohol use: Not Currently  . Drug use: Not Currently  . Sexual activity: Not on file  Lifestyle  . Physical activity:    Days per week: Not on file    Minutes per session: Not on file  . Stress: Not on file  Relationships  . Social connections:    Talks on phone: Not on file    Gets together: Not on file    Attends religious service: Not on file    Active member of club or organization: Not on file    Attends meetings of clubs or organizations: Not on file    Relationship status: Not on file  . Intimate partner violence:    Fear of current or ex partner: Not on file   Emotionally abused: Not on file    Physically abused: Not on file    Forced sexual activity: Not on file  Other Topics Concern  . Not on file  Social History Narrative  . Not on file     Physical Exam  Vital Signs and Nursing Notes reviewed Vitals:   10/22/18 1915 10/22/18 1920  BP: 103/65 107/69  Pulse: 88 92  Resp: (!) 24 (!) 24  Temp:    SpO2: 100% 100%    CONSTITUTIONAL: Ill-appearing, in significant respiratory distress NEURO: Somnolent but awakes and converses, oriented to name, moving all extremities EYES:  eyes equal and reactive ENT/NECK:  no LAD, no JVD CARDIO: Tachycardic rate, well-perfused, normal S1 and S2 PULM:  CTAB no wheezing or rhonchi, tachypneic GI/GU:  normal bowel sounds, non-distended, non-tender MSK/SPINE:  No gross deformities, no edema SKIN:  no rash, atraumatic PSYCH:  Appropriate speech and behavior  Diagnostic and Interventional Summary    EKG Interpretation  Date/Time:  Wednesday Oct 22 2018 16:19:34 EDT Ventricular Rate:  103 PR Interval:    QRS Duration: 91 QT Interval:  391 QTC Calculation: 512 R Axis:   72 Text Interpretation:  Sinus tachycardia Probable anteroseptal infarct, old Borderline repolarization abnormality Prolonged QT interval Confirmed by Kennis CarinaBero, Nakeshia Waldeck [id number]) on 10/22/2018 5:24:36  PM      Labs Reviewed  SARS CORONAVIRUS 2 (HOSP ORDER, PERFORMED IN [facility] VIA [employer] ID) - Abnormal; Notable for the following components:      Result Value   SARS Coronavirus 2 ([employer] ID Now) POSITIVE (*)    All other components within normal limits  CBC WITH DIFFERENTIAL/PLATELET - Abnormal; Notable for the following components:   WBC 16.4 (*)    Neutro Abs 14.2 (*)    Abs Immature Granulocytes 0.25 (*)    All other components within normal limits  URINALYSIS, ROUTINE W REFLEX MICROSCOPIC - Abnormal; Notable for the following components:   Color, Urine AMBER (*)    Specific Gravity, Urine >1.030 (*)    Hgb urine dipstick  LARGE (*)    Bilirubin Urine SMALL (*)    Protein, ur 100 (*)    All other components within normal limits  COMPREHENSIVE METABOLIC PANEL - Abnormal; Notable for the following components:   Glucose, Bld 156 (*)    BUN 23 (*)    Calcium 8.7 (*)    Total Protein 8.4 (*)    Albumin 3.2 (*)    AST 112 (*)    ALT 58 (*)    Alkaline Phosphatase 203 (*)    All other components within normal limits  TROPONIN I - Abnormal; Notable for the following components:   Troponin I 0.04 (*)    All other components within normal limits  URINALYSIS, MICROSCOPIC (REFLEX) - Abnormal; Notable for the following components:   Bacteria, UA FEW (*)    All other components within normal limits  POCT I-STAT 7, (LYTES, BLD GAS, ICA,H+H) - Abnormal; Notable for the following components:   pH, Arterial 7.482 (*)    pO2, Arterial 246.0 (*)    Acid-Base Excess 4.0 (*)    Calcium, Ion 1.11 (*)    All other components within normal limits  PREGNANCY, URINE  LIPASE, BLOOD  BLOOD GAS, ARTERIAL  I-STAT ARTERIAL BLOOD GAS, ED    DG Chest Portable 1 View  Final Result    DG Chest Portable 1 View  Final Result      Medications  propofol (DIPRIVAN) 1000 MG/100ML infusion (45 mcg/kg/min  99.8 kg Intravenous Rate/Dose Verify 10/22/18 1923)  midazolam (VERSED) 5 MG/5ML injection 2 mg (has no administration in time range)  rocuronium (ZEMURON) injection 100 mg (100 mg Intravenous Given 10/22/18 1648)  sodium chloride 0.9 % bolus 500 mL (0 mLs Intravenous Stopped 10/22/18 1710)  etomidate (AMIDATE) injection 30 mg (30 mg Intravenous Given 10/22/18 1648)     Procedure Name: Intubation Date/Time: 10/22/2018 7:30 PM Performed by: Sabas Sous, MD Pre-anesthesia Checklist: Patient identified, Patient being monitored, Emergency Drugs available, Timeout performed and Suction available Oxygen Delivery Method: Non-rebreather mask Preoxygenation: Pre-oxygenation with 100% oxygen Induction Type: Rapid sequence  Ventilation: Mask ventilation without difficulty Laryngoscope Size: Glidescope and 3 Grade View: Grade I Tube size: 7.5 mm Number of attempts: 1 Airway Equipment and Method: Rigid stylet Placement Confirmation: ETT inserted through vocal cords under direct vision,  CO2 detector and Breath sounds checked- equal and bilateral Secured at: 23 cm Tube secured with: ETT holder Comments: Uncomplicated intubation using 30 mg etomidate and 100 mg rocuronium for RSI.      Critical Care Critical Care Documentation Critical care time provided by me (excluding procedures): 44 minutes  Condition necessitating critical care: Acute hypoxic respiratory failure in the setting of novel coronavirus infection  Components of critical care management: reviewing of prior  records, laboratory and imaging interpretation, frequent re-examination and reassessment of vital signs, administration of judicious IV fluids, ventilatory management, management of post intubation sedation, discussion with consulting services    ED Course and Medical Decision Making  I have reviewed the triage vital signs and the nursing notes.  Pertinent labs & imaging results that were available during my care of the patient were reviewed by me and considered in my medical decision making (see below for details).  Profound hypoxia on arrival in the 60s on room air, improved to the low 90s on nonrebreather but still breathing 30-40 times per minute, patient's mental status improved with the improved oxygenation.  Initial concern for COVID-19, confirmed on nasal swab.  Discussed severity of situation with patient and daughter, discussed the need for intubation, performed as described above.  No complications, hemodynamically stable, will transfer to [facility].  Elmer Sow. Pilar Plate, MD [facility] Emergency Medicine [facility] [email or web address]  Final Clinical Impressions(s) / ED Diagnoses     ICD-10-CM   1.  COVID-19 U07.1    J98.8   2. Acute respiratory failure with hypoxia [facility]) J96.01     ED Discharge Orders    None         Sabas Sous, MD 10/22/18 1932

## 2018-10-22 NOTE — ED Notes (Signed)
Pt started to bite on tube and breath against vent. EDP notified. See new orders.

## 2018-10-23 DIAGNOSIS — J9601 Acute respiratory failure with hypoxia: Secondary | ICD-10-CM

## 2018-10-23 LAB — COMPREHENSIVE METABOLIC PANEL
ALT: 51 U/L — ABNORMAL HIGH (ref 0–44)
AST: 101 U/L — ABNORMAL HIGH (ref 15–41)
Albumin: 2.6 g/dL — ABNORMAL LOW (ref 3.5–5.0)
Alkaline Phosphatase: 161 U/L — ABNORMAL HIGH (ref 38–126)
Anion gap: 12 (ref 5–15)
BUN: 22 mg/dL — ABNORMAL HIGH (ref 6–20)
CO2: 21 mmol/L — ABNORMAL LOW (ref 22–32)
Calcium: 8.4 mg/dL — ABNORMAL LOW (ref 8.9–10.3)
Chloride: 105 mmol/L (ref 98–111)
Creatinine, Ser: 0.8 mg/dL (ref 0.44–1.00)
GFR calc Af Amer: >60 mL/min (ref >60)
GFR calc non Af Amer: >60 mL/min (ref >60)
Glucose, Bld: 103 mg/dL — ABNORMAL HIGH (ref 70–99)
Potassium: 4.6 mmol/L (ref 3.5–5.1)
Sodium: 138 mmol/L (ref 135–145)
Total Bilirubin: 1.1 mg/dL (ref 0.3–1.2)
Total Protein: 6.8 g/dL (ref 6.5–8.1)

## 2018-10-23 LAB — TRIGLYCERIDES: Triglycerides: 353 mg/dL — ABNORMAL HIGH (ref <150)

## 2018-10-23 LAB — CBC WITH DIFFERENTIAL/PLATELET
Abs Immature Granulocytes: 0.13 10*3/uL — ABNORMAL HIGH (ref 0.00–0.07)
Basophils Absolute: 0 10*3/uL (ref 0.0–0.1)
Basophils Relative: 0 %
Eosinophils Absolute: 0 10*3/uL (ref 0.0–0.5)
Eosinophils Relative: 0 %
HCT: 39.8 % (ref 36.0–46.0)
Hemoglobin: 12 g/dL (ref 12.0–15.0)
Immature Granulocytes: 1 %
Lymphocytes Relative: 12 %
Lymphs Abs: 1.6 10*3/uL (ref 0.7–4.0)
MCH: 27.1 pg (ref 26.0–34.0)
MCHC: 30.2 g/dL (ref 30.0–36.0)
MCV: 90 fL (ref 80.0–100.0)
Monocytes Absolute: 0.4 10*3/uL (ref 0.1–1.0)
Monocytes Relative: 3 %
Neutro Abs: 11.3 10*3/uL — ABNORMAL HIGH (ref 1.7–7.7)
Neutrophils Relative %: 84 %
Platelets: 219 10*3/uL (ref 150–400)
RBC: 4.42 MIL/uL (ref 3.87–5.11)
RDW: 15.1 % (ref 11.5–15.5)
WBC: 13.5 10*3/uL — ABNORMAL HIGH (ref 4.0–10.5)
nRBC: 0 % (ref 0.0–0.2)

## 2018-10-23 LAB — PHOSPHORUS
Phosphorus: 2.9 mg/dL (ref 2.5–4.6)
Phosphorus: 2.6 mg/dL (ref 2.5–4.6)

## 2018-10-23 LAB — TROPONIN I: Troponin I: 0.03 ng/mL (ref <0.03)

## 2018-10-23 LAB — D-DIMER, QUANTITATIVE
D-Dimer, Quant: 2 ug/mL-FEU — ABNORMAL HIGH (ref 0.00–0.50)
D-Dimer, Quant: 1.57 ug/mL-FEU — ABNORMAL HIGH (ref 0.00–0.50)

## 2018-10-23 LAB — ABO/RH: ABO/RH(D): B POS

## 2018-10-23 LAB — GLUCOSE, CAPILLARY
Glucose-Capillary: 137 mg/dL — ABNORMAL HIGH (ref 70–99)
Glucose-Capillary: 118 mg/dL — ABNORMAL HIGH (ref 70–99)

## 2018-10-23 LAB — FERRITIN: Ferritin: 703 ng/mL — ABNORMAL HIGH (ref 11–307)

## 2018-10-23 LAB — BRAIN NATRIURETIC PEPTIDE: B Natriuretic Peptide: 85.9 pg/mL (ref 0.0–100.0)

## 2018-10-23 LAB — C-REACTIVE PROTEIN: CRP: 26.5 mg/dL — ABNORMAL HIGH (ref <1.0)

## 2018-10-23 LAB — PROCALCITONIN: Procalcitonin: 0.37 ng/mL

## 2018-10-23 LAB — LACTIC ACID, PLASMA: Lactic Acid, Venous: 1.8 mmol/L (ref 0.5–1.9)

## 2018-10-23 LAB — MAGNESIUM
Magnesium: 2.7 mg/dL — ABNORMAL HIGH (ref 1.7–2.4)
Magnesium: 2.4 mg/dL (ref 1.7–2.4)

## 2018-10-23 MED ORDER — VITAL HIGH PROTEIN PO LIQD: 1000.0000 mL | ORAL | Status: DC

## 2018-10-23 MED ORDER — TOCILIZUMAB 400 MG/20ML IV SOLN
800.0000 mg | Freq: Once | INTRAVENOUS | Status: AC
  Administered 2018-10-23: 800 mg via INTRAVENOUS
  Filled 2018-10-23: qty 40

## 2018-10-23 MED ORDER — VITAL HIGH PROTEIN PO LIQD
1000.0000 mL | ORAL | Status: DC
  Administered 2018-10-23 – 2018-11-04 (×11): 1000 mL
  Administered 2018-11-04: 07:00:00
  Administered 2018-11-05 – 2018-11-10 (×6): 1000 mL

## 2018-10-23 MED ORDER — SODIUM CHLORIDE 0.9 % IV BOLUS
500.0000 mL | Freq: Once | INTRAVENOUS | Status: AC
  Administered 2018-10-23: 500 mL via INTRAVENOUS

## 2018-10-23 MED ORDER — SODIUM CHLORIDE 0.9 % IV SOLN
INTRAVENOUS | Status: DC
  Administered 2018-10-23 – 2018-10-24 (×2): via INTRAVENOUS

## 2018-10-23 MED ORDER — SODIUM CHLORIDE 0.9 % IV SOLN
100.0000 mg | INTRAVENOUS | Status: DC
  Filled 2018-10-23: qty 20

## 2018-10-23 MED ORDER — PRO-STAT SUGAR FREE PO LIQD
60.0000 mL | Freq: Every day | ORAL | Status: DC
  Administered 2018-10-23 – 2018-11-10 (×19): 60 mL
  Filled 2018-10-23 (×18): qty 60

## 2018-10-23 MED ORDER — ADULT MULTIVITAMIN W/MINERALS CH
1.0000 | ORAL_TABLET | Freq: Every day | ORAL | Status: DC
  Administered 2018-10-23 – 2018-11-05 (×14): 1
  Filled 2018-10-23 (×16): qty 1

## 2018-10-23 MED ORDER — PRO-STAT SUGAR FREE PO LIQD: 60.0000 mL | Freq: Two times a day (BID) | ORAL | Status: DC

## 2018-10-23 MED ORDER — FUROSEMIDE 10 MG/ML IJ SOLN
40.0000 mg | Freq: Once | INTRAMUSCULAR | Status: AC
  Administered 2018-10-23: 40 mg via INTRAVENOUS
  Filled 2018-10-23: qty 4

## 2018-10-23 MED ORDER — CHLORHEXIDINE GLUCONATE CLOTH 2 % EX PADS
6.0000 | MEDICATED_PAD | Freq: Every day | CUTANEOUS | Status: DC
  Administered 2018-10-23 – 2018-10-27 (×4): 6 via TOPICAL

## 2018-10-23 MED ORDER — SODIUM CHLORIDE 0.9 % IV SOLN
200.0000 mg | Freq: Once | INTRAVENOUS | Status: AC
  Administered 2018-10-23: 200 mg via INTRAVENOUS
  Filled 2018-10-23: qty 40

## 2018-10-23 MED ORDER — METHYLPREDNISOLONE SODIUM SUCC 125 MG IJ SOLR
40.0000 mg | Freq: Three times a day (TID) | INTRAMUSCULAR | Status: DC
  Administered 2018-10-23 – 2018-10-24 (×6): 40 mg via INTRAVENOUS
  Filled 2018-10-23 (×5): qty 2

## 2018-10-23 NOTE — Progress Notes (Addendum)
Initial Nutrition Assessment RD working remotely.  DOCUMENTATION CODES:   Obesity unspecified  INTERVENTION:    Vital High Protein at 40 ml/h (960 ml per day)   Pro-stat 60 ml once daily   Provides 1160 kcal (1794 kcal total with lipids from Propofol), 114 gm protein, 803 ml free water daily    Multivitamin daily via tube  NUTRITION DIAGNOSIS:   Inadequate oral intake related to inability to eat as evidenced by NPO status.  GOAL:   Provide needs based on ASPEN/SCCM guidelines  MONITOR:   Vent status, TF tolerance, Labs, Skin  REASON FOR ASSESSMENT:   Ventilator, Consult Enteral/tube feeding initiation and management  ASSESSMENT:   53 yo female with no significant PMH who was admitted with COVID-19.  Received MD Consult for TF initiation and management. OGT in place.  Patient is currently intubated on ventilator support MV: 8.1 L/min Temp (24hrs), Avg:98.1 F (36.7 C), Min:98.1 F (36.7 C), Max:98.3 F (36.8 C)  Propofol: 24 ml/hr providing 634 kcal from lipid.   Labs reviewed. Triglycerides 353 (H)  Medications reviewed and include Solu-medrol, vitamin C, zinc, propofol.    NUTRITION - FOCUSED PHYSICAL EXAM:  unable to complete  Diet Order:   Diet Order            Diet NPO time specified  Diet effective now              EDUCATION NEEDS:   No education needs have been identified at this time  Skin:  Skin Assessment: Reviewed RN Assessment  Last BM:  no BM documented  Height:   Ht Readings from Last 1 Encounters:  10/22/18 5\' 4"  (1.626 m)    Weight:   Wt Readings from Last 1 Encounters:  10/23/18 94.7 kg    Ideal Body Weight:  54.5 kg  BMI:  Body mass index is 35.84 kg/m.  Estimated Nutritional Needs:   Kcal:  5170-0174  Protein:  109 gm  Fluid:  >/= 1.6 L    Joaquin Courts, RD, LDN, CNSC Pager [phone] After Hours Pager [phone]

## 2018-10-23 NOTE — Progress Notes (Signed)
NAME:  Shannon Meyer, MRN:  [id number], DOB:  1965-06-15, LOS: 1 ADMISSION DATE:  10/22/2018, CONSULTATION DATE: Oct 22, 2018 REFERRING MD: Dr. Pilar Plate, ER physician, CHIEF COMPLAINT: Dyspnea  Brief History   53 year old female presented to [facility] emergency room with fever cough and shortness of breath.  Required intubation for severe hypoxemic respiratory failure.  Noted to be COVID positive.   Past Medical History  No known past medical history  Significant Hospital Events   May 20 admission to [facility]  Consults:  Pulmonary critical care  Procedures:  May 20 ET tube  Significant Diagnostic Tests:    Micro Data:  SARS-CoV-2 May 20> positive Blood culture May 20 >  Antimicrobials:  Zithromax 5/20 >>  Rocephin 5/20 >>    Interim history/subjective:  Admitted overnight, hypoxemia slightly improved, no acute events, not on tube feeding, remains on propofol alone  Objective   Blood pressure 94/65, pulse 65, temperature 98.1 F (36.7 C), resp. rate (!) 24, height 5\' 4"  (1.626 m), weight 94.7 kg, SpO2 99 %.    Vent Mode: PRVC FiO2 (%):  [50 %-100 %] 50 % Set Rate:  [20 bmp-24 bmp] 24 bmp Vt Set:  [330 mL-450 mL] 330 mL PEEP:  [10 cmH20] 10 cmH20 Plateau Pressure:  [24 cmH20-30 cmH20] 27 cmH20   Intake/Output Summary (Last 24 hours) at 10/23/2018 1456 Last data filed at 10/23/2018 1020 Gross per 24 hour  Intake 1824.28 ml  Output 695 ml  Net 1129.28 ml   Filed Weights   10/22/18 1609 10/23/18 0500  Weight: 99.8 kg 94.7 kg    Examination:  General:  In bed on vent HENT: NCAT ETT in place PULM: CTA B, vent supported breathing CV: RRR, no mgr GI: BS+, soft, nontender MSK: normal bulk and tone Neuro: sedated on vent   Resolved Hospital Problem list     Assessment & Plan:  Acute respiratory failure with hypoxemia due to COVID 19, fortunately minimal infiltrate, oxygen needs reasonable today Continue Solu-Medrol  Continue Actemra Start using Remdesivir today Continue full mechanical ventilatory support Diurese as able, start dose of Lasix today Ventilator associated pneumonia prevention Maintain tidal volume 6 to 8 cc/kg of ideal body weight, monitoring plateau pressure with goal less than 30 cm of water    Best practice:  Diet: start tube feeding Pain/Anxiety/Delirium protocol (if indicated): yes, RASS goal -1 to -2 VAP protocol (if indicated): yes DVT prophylaxis: lovenox bid GI prophylaxis: famotidine Glucose control: SSI Mobility: bed rest Code Status: full Family Communication: per [facility] Disposition: remain in ICU  Labs   CBC: Recent Labs  Lab 10/22/18 1611 10/22/18 1744 10/23/18 0652  WBC 16.4*  --  13.5*  NEUTROABS 14.2*  --  11.3*  HGB 13.7 13.6 12.0  HCT 45.5 40.0 39.8  MCV 91.0  --  90.0  PLT 256  --  219    Basic Metabolic Panel: Recent Labs  Lab 10/22/18 1612 10/22/18 1744 10/23/18 0652  NA 135 137 138  K 3.8 3.5 4.6  CL 98  --  105  CO2 26  --  21*  GLUCOSE 156*  --  103*  BUN 23*  --  22*  CREATININE 0.80  --  0.80  CALCIUM 8.7*  --  8.4*  MG  --   --  2.7*  PHOS  --   --  2.9   GFR: Estimated Creatinine Clearance: 88.7 mL/min (by C-G formula based on SCr of 0.8  mg/dL). Recent Labs  Lab 10/22/18 1611 10/23/18 0649 10/23/18 0651 10/23/18 0652  PROCALCITON  --  0.37  --   --   WBC 16.4*  --   --  13.5*  LATICACIDVEN  --   --  1.8  --     Liver Function Tests: Recent Labs  Lab 10/22/18 1612 10/23/18 0652  AST 112* 101*  ALT 58* 51*  ALKPHOS 203* 161*  BILITOT 0.5 1.1  PROT 8.4* 6.8  ALBUMIN 3.2* 2.6*   Recent Labs  Lab 10/22/18 1612  LIPASE 26   No results for input(s): AMMONIA in the last 168 hours.  ABG    Component Value Date/Time   PHART 7.482 (H) 10/22/2018 1744   PCO2ART 36.1 10/22/2018 1744   PO2ART 246.0 (H) 10/22/2018 1744   HCO3 27.0 10/22/2018 1744   TCO2 28 10/22/2018 1744   O2SAT 100.0 10/22/2018 1744      Coagulation Profile: No results for input(s): INR, PROTIME in the last 168 hours.  Cardiac Enzymes: Recent Labs  Lab 10/22/18 1625 10/23/18 0652  TROPONINI 0.04* 0.03*    HbA1C: No results found for: HGBA1C  CBG: No results for input(s): GLUCAP in the last 168 hours.   Critical care time: 35 minutes       Heber CarolinaBrent , MD [place] [facility] Pager: [phone] Cell: [phone] If no response, call [phone]

## 2018-10-23 NOTE — Plan of Care (Signed)

## 2018-10-23 NOTE — Progress Notes (Signed)
PROGRESS NOTE  Shannon Meyer DPO:[id number] DOB: 04-Jun-1966 DOA: 10/22/2018  PCP: No primary care provider on file.  Brief History/Interval Summary: 53 year old female with unknown past medical history presented with 5-day history of weakness cough shortness of breath.  She was found to be significantly hypoxic when she was evaluated in the emergency department.  She was positive for COVID-19.  She was intubated and then transferred to the hospital for further management.  Reason for Visit: Acute respiratory failure with hypoxia secondary to COVID-19  Consultants: Pulmonology  Procedures: Intubation  Antibiotics: Anti-infectives (From admission, onward)   Start     Dose/Rate Route Frequency Ordered Stop   10/24/18 1400  remdesivir 100 mg in sodium chloride 0.9 % 250 mL IVPB     100 mg over 30 Minutes Intravenous Every 24 hours 10/23/18 1153 11/02/18 1359   10/23/18 1400  remdesivir 200 mg in sodium chloride 0.9 % 250 mL IVPB     200 mg over 30 Minutes Intravenous Once 10/23/18 1153     10/22/18 2200  azithromycin (ZITHROMAX) 500 mg in sodium chloride 0.9 % 250 mL IVPB     500 mg 250 mL/hr over 60 Minutes Intravenous Every 24 hours 10/22/18 2148     10/22/18 2200  cefTRIAXone (ROCEPHIN) 1 g in sodium chloride 0.9 % 100 mL IVPB     1 g 200 mL/hr over 30 Minutes Intravenous Every 24 hours 10/22/18 2148        Subjective/Interval History: Patient intubated and sedated.    Assessment/Plan:  Acute Hypoxic Resp. Failure due to Acute Covid 19 Viral Illness  Vent Mode: PRVC FiO2 (%):  [50 %-100 %] 50 % Set Rate:  [20 bmp-24 bmp] 24 bmp Vt Set:  [330 mL-450 mL] 330 mL PEEP:  [10 cmH20] 10 cmH20 Plateau Pressure:  [24 cmH20-30 cmH20] 27 cmH20     Component Value Date/Time   PHART 7.482 (H) 10/22/2018 1744   PCO2ART 36.1 10/22/2018 1744   PO2ART 246.0 (H) 10/22/2018 1744   HCO3 27.0 10/22/2018 1744   TCO2 28 10/22/2018 1744   O2SAT 100.0 10/22/2018 1744    COVID-19  Labs  Recent Labs    10/23/18 0100 10/23/18 0650 10/23/18 0651 10/23/18 0652  DDIMER 2.00*  --   --  1.57*  FERRITIN  --  703*  --   --   CRP  --   --  26.5*  --     Lab Results  Component Value Date   SARSCOV2NAA POSITIVE (A) 10/22/2018     Fever: Afebrile since she has been here Oxygen requirements: On mechanical ventilation.  50% FiO2.  Saturating in the 90s. Antibiotics: Ceftriaxone and azithromycin Steroids: Solu-Medrol 40 mg every 8 hours Diuretics: Not on scheduled diuretics Actemra: 1 dose given on 5/21 at 1:26 AM.  Second dose to be given this afternoon Remdesivir: Will be initiated Convalescent Plasma: Not yet Vitamin C and Zinc: Continue  Patient remains on mechanical ventilation.  Pulmonology has been consulted.  D-dimer 1.57.  CRP 26.5.  Ferritin 703.  Patient is on steroids.  Procalcitonin was noted to be 0.37.  Patient had elevated WBC.  Started on antibiotics.  Patient given Actemra earlier this morning.  Another dose will be repeated this afternoon.  Patient has been started on Remdesivir.  Continue to monitor closely.   The treatment plan and use of medications and known side effects were discussed with patient/family. It was clearly explained that there is no proven definitive treatment for COVID-19 infection yet. Any  medications used here are based on case reports/anecdotal data which are not peer-reviewed and has not been studied using randomized control trials.  Complete risks and long-term side effects are unknown, however in the best clinical judgment they seem to be of some clinical benefit rather than medical risks.  Patient/family agree with the treatment plan and want to receive these treatments as indicated.  Patient was given Actemra and steroids.  Sepsis present on admission Soft blood pressures were noted in the emergency department.  Could be due to propofol.  Levophed was ordered but has not required it yet.  Patient empirically on ceftriaxone and  azithromycin.  We will recheck procalcitonin tomorrow.  Lactic acid level 1.8.  Leukocytosis As above.  Follow procalcitonin trends.  Transaminitis Mildly elevated LFTs noted even at admission.  Apparently a previous history of alcohol use is mentioned.  Continue to trend LFTs.  QT prolongation Noted to be 490 ms.  Avoid QT prolonging agents as much as possible.  FEN Due to soft blood pressures we will started on IV fluids with normal saline.  Monitor electrolytes.  Tube feedings to be initiated.   DVT Prophylaxis:  Lovenox 40 mg every 12 hours  Lab Results  Component Value Date   PLT 219 10/23/2018     PUD Prophylaxis: Famotidine Code Status: Full code Family Communication: Will discuss with family members today Disposition Plan: Will remain in ICU   Medications:  Scheduled: . chlorhexidine gluconate (MEDLINE KIT)  15 mL Mouth Rinse BID  . Chlorhexidine Gluconate Cloth  6 each Topical Daily  . enoxaparin (LOVENOX) injection  40 mg Subcutaneous Q12H  . famotidine  20 mg Per Tube Q12H  . feeding supplement (PRO-STAT SUGAR FREE 64)  60 mL Per Tube Daily  . feeding supplement (VITAL HIGH PROTEIN)  1,000 mL Per Tube Q24H  . mouth rinse  15 mL Mouth Rinse 10 times per day  . methylPREDNISolone (SOLU-MEDROL) injection  40 mg Intravenous Q8H  . multivitamin with minerals  1 tablet Per Tube Daily  . sodium chloride flush  3 mL Intravenous Q12H  . vitamin C  500 mg Per Tube Daily  . zinc sulfate  220 mg Per Tube Daily   Continuous: . azithromycin Stopped (10/23/18 1020)  . cefTRIAXone (ROCEPHIN)  IV Stopped (10/23/18 0008)  . norepinephrine (LEVOPHED) Adult infusion Stopped (10/22/18 2115)  . propofol (DIPRIVAN) infusion 50 mcg/kg/min (10/23/18 1020)  . remdesivir 200 mg in NS 250 mL     Followed by  . [START ON 10/24/2018] remdesivir 100 mg in NS 250 mL    . tocilizumab (ACTEMRA) IV     NOB:SJGGEZMOQ, docusate, fentaNYL (SUBLIMAZE) injection,  midazolam   Objective:  Vital Signs  Vitals:   10/23/18 0727 10/23/18 0800 10/23/18 0900 10/23/18 1000  BP: (!) 86/62 (!) 85/61 (!) 137/109 94/65  Pulse: 72 73 81 65  Resp: (!) 24 (!) 24 (!) 23 (!) 24  Temp:  98.1 F (36.7 C)    TempSrc:      SpO2: 99% 99% 98% 99%  Weight:      Height:        Intake/Output Summary (Last 24 hours) at 10/23/2018 1229 Last data filed at 10/23/2018 1020 Gross per 24 hour  Intake 1824.28 ml  Output 695 ml  Net 1129.28 ml   Filed Weights   10/22/18 1609 10/23/18 0500  Weight: 99.8 kg 94.7 kg    General appearance: Patient intubated and sedated Resp: Coarse breath sounds bilaterally.  No wheezing  appreciated.  Coarse crackles. Cardio: S1-S2 is normal regular.  No S3-S4.  No rubs murmurs or bruit GI: Abdomen is soft.  Nontender nondistended.  Bowel sounds are present normal.  No masses organomegaly Extremities: No edema.   Neurologic: Intubated and sedated   Lab Results:  Data Reviewed: I have personally reviewed following labs and imaging studies  CBC: Recent Labs  Lab 10/22/18 1611 10/22/18 1744 10/23/18 0652  WBC 16.4*  --  13.5*  NEUTROABS 14.2*  --  11.3*  HGB 13.7 13.6 12.0  HCT 45.5 40.0 39.8  MCV 91.0  --  90.0  PLT 256  --  852    Basic Metabolic Panel: Recent Labs  Lab 10/22/18 1612 10/22/18 1744 10/23/18 0652  NA 135 137 138  K 3.8 3.5 4.6  CL 98  --  105  CO2 26  --  21*  GLUCOSE 156*  --  103*  BUN 23*  --  22*  CREATININE 0.80  --  0.80  CALCIUM 8.7*  --  8.4*  MG  --   --  2.7*  PHOS  --   --  2.9    GFR: Estimated Creatinine Clearance: 88.7 mL/min (by C-G formula based on SCr of 0.8 mg/dL).  Liver Function Tests: Recent Labs  Lab 10/22/18 1612 10/23/18 0652  AST 112* 101*  ALT 58* 51*  ALKPHOS 203* 161*  BILITOT 0.5 1.1  PROT 8.4* 6.8  ALBUMIN 3.2* 2.6*    Recent Labs  Lab 10/22/18 1612  LIPASE 26    Cardiac Enzymes: Recent Labs  Lab 10/22/18 1625 10/23/18 0652  TROPONINI  0.04* 0.03*     Lipid Profile: Recent Labs    10/23/18 0652  TRIG 353*    Anemia Panel: Recent Labs    10/23/18 0650  FERRITIN 703*    Recent Results (from the past 240 hour(s))  SARS Coronavirus 2 (Hosp order,Performed in [place] lab via [employer] ID)     Status: Abnormal   Collection Time: 10/22/18  4:16 PM  Result Value Ref Range Status   SARS Coronavirus 2 ([employer] Now) POSITIVE (A) NEGATIVE Final    Comment: RESULT CALLED TO, READ BACK BY AND VERIFIED WITH: CALLED TO Brule RN AT 1650 BY SROY ON [phone] (NOTE) Interpretive Result Comment(s): COVID 19 Positive SARS CoV 2 target nucleic acids are DETECTED. The SARS CoV 2 RNA is generally detectable in upper and lower respiratory specimens during the acute phase of infection.  Positive results are indicative of active infection with SARS CoV 2.  Clinical correlation with patient history and other diagnostic information is necessary to determine patient infection status.  Positive results do not rule out bacterial infection or coinfection with other viruses. The expected result is Negative. COVID 19 Negative SARS CoV 2 target nucleic acids are NOT DETECTED. The SARS CoV 2 RNA is generally detectable in upper and lower respiratory specimens during the acute phase of infection.  Negative results do not preclude SARS CoV 2 infection, do not rule out coinfections with other pathogens, and should not be used as the sole basis for  treatment or other patient management decisions.  Negative results must be combined with clinical observations, patient history, and epidemiological information. The expected result is Negative. Invalid Presence or absence of SARS CoV 2 nucleic acids cannot be determined. Repeat testing was performed on the submitted specimen and repeated Invalid results were obtained.  If clinically indicated, additional testing on a new specimen with an alternate test methodology (  [id number]) is advised.   The SARS CoV 2 RNA is generally detectable in upper and lower respiratory specimens during the acute phase of infection. The expected result is Negative. Fact Sheet for Patients:  GolfingFamily.no Fact Sheet for Healthcare Providers: https://www.hernandez-brewer.com/ This test is not yet approved or cleared by the [place] FDA and has been authorized for detection and/or diagnosis of SARS CoV 2 by FDA under an Emergency Use Authorization (EUA).   This EUA will remain in effect (meaning this test can be used) for the duration of the COVID19 declaration under Section 564(b)(1) of the Act, 21 U.S.C. section [phone] 3(b)(1), unless the authorization is terminated or revoked sooner. Performed at [facility], [address] [place], [place] [address]       Radiology Studies: Dg Chest Portable 1 View  Result Date: 10/22/2018 CLINICAL DATA:  Tube placement EXAM: PORTABLE CHEST 1 VIEW COMPARISON:  10/22/2018 FINDINGS: The endotracheal tube terminates above the carina by approximately 2.7 cm. The enteric tube extends below the left hemidiaphragm. The cardiac silhouette is mildly enlarged. There are increasing perihilar airspace opacities and prominent interstitial lung markings. There is a worsening retrocardiac/left basilar airspace opacity. IMPRESSION: 1. Lines and tubes as above. 2. Worsening perihilar airspace opacities and prominent interstitial lung markings can be seen in patients with atypical pneumonia. 3. Worsening retrocardiac/left basilar airspace opacity concerning for aspiration or developing consolidation. Electronically Signed   By: Constance Holster M.D.   On: 10/22/2018 19:02   Dg Chest Portable 1 View  Result Date: 10/22/2018 CLINICAL DATA:  Fever, cough. EXAM: PORTABLE CHEST 1 VIEW COMPARISON:  None. FINDINGS: The heart size and mediastinal contours are within normal limits. Both lungs are clear. The visualized skeletal  structures are unremarkable. IMPRESSION: No active disease. Electronically Signed   By: Marijo Conception M.D.   On: 10/22/2018 16:46       LOS: 1 day   [facility] Pager on [email or web address]  10/23/2018, 12:29 PM

## 2018-10-24 ENCOUNTER — Inpatient Hospital Stay (HOSPITAL_COMMUNITY): Payer: HRSA Program

## 2018-10-24 DIAGNOSIS — U071 COVID-19: Principal | ICD-10-CM

## 2018-10-24 DIAGNOSIS — J9601 Acute respiratory failure with hypoxia: Secondary | ICD-10-CM

## 2018-10-24 DIAGNOSIS — J988 Other specified respiratory disorders: Secondary | ICD-10-CM

## 2018-10-24 LAB — BLOOD CULTURE ID PANEL (REFLEXED)

## 2018-10-24 LAB — CBC WITH DIFFERENTIAL/PLATELET
Abs Immature Granulocytes: 0.29 10*3/uL — ABNORMAL HIGH (ref 0.00–0.07)
Basophils Absolute: 0 10*3/uL (ref 0.0–0.1)
Basophils Relative: 0 %
Eosinophils Absolute: 0 10*3/uL (ref 0.0–0.5)
Eosinophils Relative: 0 %
HCT: 40.7 % (ref 36.0–46.0)
Hemoglobin: 12.5 g/dL (ref 12.0–15.0)
Immature Granulocytes: 1 %
Lymphocytes Relative: 8 %
Lymphs Abs: 1.7 10*3/uL (ref 0.7–4.0)
MCH: 28 pg (ref 26.0–34.0)
MCHC: 30.7 g/dL (ref 30.0–36.0)
MCV: 91.1 fL (ref 80.0–100.0)
Monocytes Absolute: 0.4 10*3/uL (ref 0.1–1.0)
Monocytes Relative: 2 %
Neutro Abs: 19.9 10*3/uL — ABNORMAL HIGH (ref 1.7–7.7)
Neutrophils Relative %: 89 %
Platelets: 299 10*3/uL (ref 150–400)
RBC: 4.47 MIL/uL (ref 3.87–5.11)
RDW: 15.3 % (ref 11.5–15.5)
WBC: 22.4 10*3/uL — ABNORMAL HIGH (ref 4.0–10.5)
nRBC: 0 % (ref 0.0–0.2)

## 2018-10-24 LAB — COMPREHENSIVE METABOLIC PANEL
ALT: 48 U/L — ABNORMAL HIGH (ref 0–44)
AST: 76 U/L — ABNORMAL HIGH (ref 15–41)
Albumin: 2.4 g/dL — ABNORMAL LOW (ref 3.5–5.0)
Alkaline Phosphatase: 162 U/L — ABNORMAL HIGH (ref 38–126)
Anion gap: 11 (ref 5–15)
BUN: 29 mg/dL — ABNORMAL HIGH (ref 6–20)
CO2: 22 mmol/L (ref 22–32)
Calcium: 8.4 mg/dL — ABNORMAL LOW (ref 8.9–10.3)
Chloride: 110 mmol/L (ref 98–111)
Creatinine, Ser: 0.78 mg/dL (ref 0.44–1.00)
GFR calc Af Amer: >60 mL/min (ref >60)
GFR calc non Af Amer: >60 mL/min (ref >60)
Glucose, Bld: 140 mg/dL — ABNORMAL HIGH (ref 70–99)
Potassium: 3 mmol/L — ABNORMAL LOW (ref 3.5–5.1)
Sodium: 143 mmol/L (ref 135–145)
Total Bilirubin: 0.2 mg/dL — ABNORMAL LOW (ref 0.3–1.2)
Total Protein: 6.8 g/dL (ref 6.5–8.1)

## 2018-10-24 LAB — MAGNESIUM
Magnesium: 2.4 mg/dL (ref 1.7–2.4)
Magnesium: 2.4 mg/dL (ref 1.7–2.4)

## 2018-10-24 LAB — POCT I-STAT 7, (LYTES, BLD GAS, ICA,H+H)
Acid-base deficit: 1 mmol/L (ref 0.0–2.0)
Bicarbonate: 23.6 mmol/L (ref 20.0–28.0)
Calcium, Ion: 1.21 mmol/L (ref 1.15–1.40)
HCT: 38 % (ref 36.0–46.0)
Hemoglobin: 12.9 g/dL (ref 12.0–15.0)
O2 Saturation: 92 %
Patient temperature: 97.9
Potassium: 3.2 mmol/L — ABNORMAL LOW (ref 3.5–5.1)
Sodium: 144 mmol/L (ref 135–145)
TCO2: 25 mmol/L (ref 22–32)
pCO2 arterial: 39.2 mmHg (ref 32.0–48.0)
pH, Arterial: 7.386 (ref 7.350–7.450)
pO2, Arterial: 63 mmHg — ABNORMAL LOW (ref 83.0–108.0)

## 2018-10-24 LAB — PHOSPHORUS
Phosphorus: 2.8 mg/dL (ref 2.5–4.6)
Phosphorus: 3.1 mg/dL (ref 2.5–4.6)

## 2018-10-24 LAB — C-REACTIVE PROTEIN: CRP: 21.9 mg/dL — ABNORMAL HIGH (ref <1.0)

## 2018-10-24 LAB — TRIGLYCERIDES: Triglycerides: 317 mg/dL — ABNORMAL HIGH (ref <150)

## 2018-10-24 LAB — GLUCOSE, CAPILLARY
Glucose-Capillary: 122 mg/dL — ABNORMAL HIGH (ref 70–99)
Glucose-Capillary: 144 mg/dL — ABNORMAL HIGH (ref 70–99)
Glucose-Capillary: 127 mg/dL — ABNORMAL HIGH (ref 70–99)
Glucose-Capillary: 142 mg/dL — ABNORMAL HIGH (ref 70–99)
Glucose-Capillary: 152 mg/dL — ABNORMAL HIGH (ref 70–99)

## 2018-10-24 LAB — PROCALCITONIN: Procalcitonin: 0.34 ng/mL

## 2018-10-24 LAB — D-DIMER, QUANTITATIVE: D-Dimer, Quant: 1.13 ug/mL-FEU — ABNORMAL HIGH (ref 0.00–0.50)

## 2018-10-24 MED ORDER — SODIUM CHLORIDE 0.9 % IV SOLN: 100.0000 mg | INTRAVENOUS | Status: DC

## 2018-10-24 MED ORDER — FENTANYL BOLUS VIA INFUSION
50.0000 ug | INTRAVENOUS | Status: DC | PRN
  Administered 2018-10-24 – 2018-10-31 (×9): 50 ug via INTRAVENOUS
  Filled 2018-10-24: qty 50

## 2018-10-24 MED ORDER — FENTANYL 2500MCG IN NS 250ML (10MCG/ML) PREMIX INFUSION
25.0000 ug/h | INTRAVENOUS | Status: DC
  Administered 2018-10-24: 11:00:00 75 ug/h via INTRAVENOUS
  Administered 2018-10-25 – 2018-10-30 (×4): 100 ug/h via INTRAVENOUS
  Administered 2018-11-01: 04:00:00 75 ug/h via INTRAVENOUS
  Filled 2018-10-24 (×8): qty 250

## 2018-10-24 MED ORDER — POTASSIUM CHLORIDE 20 MEQ/15ML (10%) PO SOLN
40.0000 meq | ORAL | Status: AC
  Administered 2018-10-24 (×2): 40 meq
  Filled 2018-10-24 (×2): qty 30

## 2018-10-24 MED ORDER — SODIUM CHLORIDE 0.9 % IV SOLN
INTRAVENOUS | Status: DC
  Administered 2018-10-24 – 2018-11-24 (×11): via INTRAVENOUS

## 2018-10-24 MED ORDER — SODIUM CHLORIDE 0.9 % IV SOLN
100.0000 mg | INTRAVENOUS | Status: DC
  Filled 2018-10-24: qty 20

## 2018-10-24 MED ORDER — ACETAMINOPHEN 160 MG/5ML PO SOLN
650.0000 mg | Freq: Four times a day (QID) | ORAL | Status: DC | PRN
  Administered 2018-11-02 – 2018-11-19 (×3): 650 mg
  Filled 2018-10-24 (×4): qty 20.3

## 2018-10-24 MED ORDER — SODIUM CHLORIDE 0.9 % IV SOLN
100.0000 mg | INTRAVENOUS | Status: AC
  Administered 2018-10-24 – 2018-11-01 (×9): 100 mg via INTRAVENOUS
  Filled 2018-10-24 (×9): qty 20

## 2018-10-24 MED ORDER — ACETAMINOPHEN 160 MG/5ML PO SOLN: 650.0000 mg | Freq: Four times a day (QID) | ORAL | Status: DC | PRN

## 2018-10-24 NOTE — Progress Notes (Signed)
Patient's sister called from [place] to get update on patient. Sister asking questions about chest xray results and lab results concerning patient. Unable to provide sister with any patient info due to confidentiality and policy of only giving updated information to point of contact only. Sister informed to follow-up with primary point of contact (daughter) to get update.

## 2018-10-24 NOTE — Progress Notes (Signed)
NAME:  Shannon BradfordDeborah Gaetano, MRN:  [id number], DOB:  05/26/1966, LOS: 2 ADMISSION DATE:  10/22/2018, CONSULTATION DATE: Oct 22, 2018 REFERRING MD: Dr. Pilar PlateBero, ER physician, CHIEF COMPLAINT: Dyspnea  Brief History   53 [facility] with fever cough and shortness of breath.  Required intubation for severe hypoxemic respiratory failure.  Noted to be COVID positive.   Past Medical History  No known past medical history  Significant Hospital Events   [facility]  Consults:  Pulmonary critical care  Procedures:  May 20 ET tube  Significant Diagnostic Tests:    Micro Data:  SARS-CoV-2 May 20> positive Blood culture May 20 >   Antimicrobials:  Zithromax 5/20 >>  Rocephin 5/20 >> 1 of 2 GPC (staph) >>    Interim history/subjective:  Some progress with PEEP, FiO2 weaning, currently on 0.50+8 PEEP A bit more awake, some vent dyssynchrony  Objective   Blood pressure 111/69, pulse 67, temperature 97.7 F (36.5 C), temperature source Axillary, resp. rate (!) 29, height 5\' 4"  (1.626 m), weight 94.9 kg, SpO2 (!) 88 %.    Vent Mode: PRVC FiO2 (%):  [40 %-50 %] 50 % Set Rate:  [24 bmp] 24 bmp Vt Set:  [330 mL] 330 mL PEEP:  [5 cmH20-8 cmH20] 8 cmH20 Plateau Pressure:  [21 cmH20-23 cmH20] 23 cmH20   Intake/Output Summary (Last 24 hours) at 10/24/2018 1034 Last data filed at 10/24/2018 0800 Gross per 24 hour  Intake 3747.73 ml  Output 2680 ml  Net 1067.73 ml   Filed Weights   10/22/18 1609 10/23/18 0500 10/24/18 0442  Weight: 99.8 kg 94.7 kg 94.9 kg    Examination:  General: Ill-appearing woman, lying in bed, ventilated HENT: ET tube, OG tube in place.  Oropharynx otherwise clear PULM: Coarse bilateral breath sounds.  No wheezing, few inspiratory crackles basal laterally CV: Regular, no murmur GI: Soft, nondistended, hypoactive bowel sounds MSK: No deformities Neuro: Tried to open eyes  with stimulation, did not follow commands, did not tenuously   Resolved Hospital Problem list     Assessment & Plan:  Acute respiratory failure with hypoxemia due to COVID 19, fortunately minimal infiltrate, oxygen needs stable PRVC 6 cc/kg.  Continue to wean FiO2 and PEEP as able.  Suspect that she will need slightly increased sedation to maintain our current gains.  Would favor adding low-dose fentanyl infusion, weaning propofol as able Push for SBT soon Received Actemra, currently on corticosteroids Remdesivir day 2 Received single dose furosemide 5/21, net +2.2 L for the hospitalization.  Await labs before committing to more diuresis 5/22 VAP prevention order set   Best practice:  Diet: TF Pain/Anxiety/Delirium protocol (if indicated): yes, RASS goal -1 to -2 VAP protocol (if indicated): yes DVT prophylaxis: lovenox bid GI prophylaxis: famotidine Glucose control: SSI Mobility: bed rest Code Status: full Family Communication: by Dr Rito EhrlichKrishnan 5/22 Disposition: ICU  Labs   CBC: Recent Labs  Lab 10/22/18 1611 10/22/18 1744 10/23/18 0652 10/24/18 0419  WBC 16.4*  --  13.5*  --   NEUTROABS 14.2*  --  11.3*  --   HGB 13.7 13.6 12.0 12.9  HCT 45.5 40.0 39.8 38.0  MCV 91.0  --  90.0  --   PLT 256  --  219  --     Basic Metabolic Panel: Recent Labs  Lab 10/22/18 1612 10/22/18 1744 10/23/18 0652 10/23/18 1443 10/24/18 0419  NA 135 137 138  --  144  K 3.8 3.5 4.6  --  3.2*  CL 98  --  105  --   --   CO2 26  --  21*  --   --   GLUCOSE 156*  --  103*  --   --   BUN 23*  --  22*  --   --   CREATININE 0.80  --  0.80  --   --   CALCIUM 8.7*  --  8.4*  --   --   MG  --   --  2.7* 2.4  --   PHOS  --   --  2.9 2.6  --    GFR: Estimated Creatinine Clearance: 88.8 mL/min (by C-G formula based on SCr of 0.8 mg/dL). Recent Labs  Lab 10/22/18 1611 10/23/18 0649 10/23/18 0651 10/23/18 0652  PROCALCITON  --  0.37  --   --   WBC 16.4*  --   --  13.5*  LATICACIDVEN  --    --  1.8  --     Liver Function Tests: Recent Labs  Lab 10/22/18 1612 10/23/18 0652  AST 112* 101*  ALT 58* 51*  ALKPHOS 203* 161*  BILITOT 0.5 1.1  PROT 8.4* 6.8  ALBUMIN 3.2* 2.6*   Recent Labs  Lab 10/22/18 1612  LIPASE 26   No results for input(s): AMMONIA in the last 168 hours.  ABG    Component Value Date/Time   PHART 7.386 10/24/2018 0419   PCO2ART 39.2 10/24/2018 0419   PO2ART 63.0 (L) 10/24/2018 0419   HCO3 23.6 10/24/2018 0419   TCO2 25 10/24/2018 0419   ACIDBASEDEF 1.0 10/24/2018 0419   O2SAT 92.0 10/24/2018 0419     Coagulation Profile: No results for input(s): INR, PROTIME in the last 168 hours.  Cardiac Enzymes: Recent Labs  Lab 10/22/18 1625 10/23/18 0652  TROPONINI 0.04* 0.03*    HbA1C: No results found for: HGBA1C  CBG: Recent Labs  Lab 10/23/18 2006 10/23/18 2315 10/24/18 0347 10/24/18 0823  GLUCAP 137* 118* 122* 144*     Critical care time: 33 minutes    Levy Pupa, MD, PhD 10/24/2018, 10:46 AM [facility] [phone] or if no answer [phone]

## 2018-10-24 NOTE — Progress Notes (Signed)
Made contact with patient daughter Shannon Meyer to provide support via telephone call.  Shannon Meyer shared that the staff has been doing a great job keeping her updated and that she has been calling frequently because of nerves not because she did not feel supported.  She shared challenges in her home with COVID testing and many people living in the home. We discussed commitment from our team to touch base with her once a shift to provide update and that is she needed additional support she could request a call back from myself.    Jacqulyn Cane RN, BSN, CCRN

## 2018-10-24 NOTE — Progress Notes (Signed)
Patient axillary temp 96.1 so bear hugger was applied.

## 2018-10-24 NOTE — Progress Notes (Signed)
PROGRESS NOTE  Shannon Meyer ZCH:[id number] DOB: 1965/06/09 DOA: 10/22/2018  PCP: No primary care provider on file.  Brief History/Interval Summary: 53 year old female with unknown past medical history presented with 5-day history of weakness cough shortness of breath.  She was found to be significantly hypoxic when she was evaluated in the emergency department.  She was positive for COVID-19.  She was intubated and then transferred to the hospital for further management.  Reason for Visit: Acute respiratory failure with hypoxia secondary to COVID-19  Consultants: Pulmonology  Procedures: Intubation  Antibiotics: Anti-infectives (From admission, onward)   Start     Dose/Rate Route Frequency Ordered Stop   10/24/18 1400  remdesivir 100 mg in sodium chloride 0.9 % 250 mL IVPB     100 mg over 30 Minutes Intravenous Every 24 hours 10/23/18 1153 11/02/18 1359   10/23/18 1400  remdesivir 200 mg in sodium chloride 0.9 % 250 mL IVPB     200 mg over 30 Minutes Intravenous Once 10/23/18 1153 10/23/18 1738   10/22/18 2200  azithromycin (ZITHROMAX) 500 mg in sodium chloride 0.9 % 250 mL IVPB     500 mg 250 mL/hr over 60 Minutes Intravenous Every 24 hours 10/22/18 2148     10/22/18 2200  cefTRIAXone (ROCEPHIN) 1 g in sodium chloride 0.9 % 100 mL IVPB     1 g 200 mL/hr over 30 Minutes Intravenous Every 24 hours 10/22/18 2148        Subjective/Interval History: Patient remains intubated sedated.    Assessment/Plan:  Acute Hypoxic Resp. Failure due to Acute Covid 19 Viral Illness  Vent Mode: PRVC FiO2 (%):  [40 %-50 %] 50 % Set Rate:  [24 bmp] 24 bmp Vt Set:  [330 mL] 330 mL PEEP:  [5 cmH20-8 cmH20] 8 cmH20 Plateau Pressure:  [21 cmH20-23 cmH20] 23 cmH20     Component Value Date/Time   PHART 7.386 10/24/2018 0419   PCO2ART 39.2 10/24/2018 0419   PO2ART 63.0 (L) 10/24/2018 0419   HCO3 23.6 10/24/2018 0419   TCO2 25 10/24/2018 0419   ACIDBASEDEF 1.0 10/24/2018 0419   O2SAT 92.0  10/24/2018 0419    COVID-19 Labs  Recent Labs    10/23/18 0100 10/23/18 0650 10/23/18 0651 10/23/18 0652  DDIMER 2.00*  --   --  1.57*  FERRITIN  --  703*  --   --   CRP  --   --  26.5*  --     Lab Results  Component Value Date   SARSCOV2NAA POSITIVE (A) 10/22/2018     Fever: Afebrile since she has been in the hospital. Oxygen requirements: Remains on mechanical ventilation.  50% FiO2.  Saturating in the 90s for the most part.   Antibiotics: Ceftriaxone and azithromycin Steroids: Solu-Medrol 40 mg every 8 hours Diuretics: Not on scheduled diuretics Actemra: 2 doses have been given, on 5/21. Remdesivir: Initiated on 5/21 Convalescent Plasma: Not yet Vitamin C and Zinc: Continue  Patient remains on mechanical ventilation.  Pulmonology is following.  WBC noted to be significantly elevated today probably due to steroids.  Other labs are still pending.  Inflammatory markers as above.  Patient has received 2 doses of Actemra.  She is on Solu-Medrol.  Continue Remdesivir.  Chest x-ray to be repeated this morning.   The treatment plan and use of medications and known side effects were discussed with patient/family. It was clearly explained that there is no proven definitive treatment for COVID-19 infection yet. Any medications used here are based on case  reports/anecdotal data which are not peer-reviewed and has not been studied using randomized control trials.  Complete risks and long-term side effects are unknown, however in the best clinical judgment they seem to be of some clinical benefit rather than medical risks.  Patient/family agree with the treatment plan and want to receive these treatments as indicated.  Patient was given Actemra and steroids.  Sepsis present on admission Soft blood pressures were noted in the emergency department.  Could be due to propofol.  Levophed was ordered but has not required it.  Continue empiric ceftriaxone and azithromycin.  Follow-up cultures.   Follow-up procalcitonin level.  Blood pressures have improved.  Lactic acid level 1.8.  Leukocytosis Patient noted to have leukocytosis even at admission.  Increases most likely due to steroids.  Procalcitonin level was 0.37 yesterday.  Follow-up level pending.    Transaminitis Mildly elevated LFTs noted.  Monitor on a daily basis.  Apparently a previous history of alcohol use is mentioned.  Continue to trend LFTs.  QT prolongation Noted to be 490 ms.  Avoid QT prolonging agents as much as possible.  FEN Currently on maintenance IV fluids.  Continue tube feedings.  Monitor electrolytes.   DVT Prophylaxis:  Lovenox 40 mg every 12 hours  Lab Results  Component Value Date   PLT 299 10/24/2018     PUD Prophylaxis: Famotidine Code Status: Full code Family Communication: Discussed with her daughter yesterday Disposition Plan: Will remain in ICU   Medications:  Scheduled: . chlorhexidine gluconate (MEDLINE KIT)  15 mL Mouth Rinse BID  . Chlorhexidine Gluconate Cloth  6 each Topical Daily  . enoxaparin (LOVENOX) injection  40 mg Subcutaneous Q12H  . famotidine  20 mg Per Tube Q12H  . feeding supplement (PRO-STAT SUGAR FREE 64)  60 mL Per Tube Daily  . feeding supplement (VITAL HIGH PROTEIN)  1,000 mL Per Tube Q24H  . mouth rinse  15 mL Mouth Rinse 10 times per day  . methylPREDNISolone (SOLU-MEDROL) injection  40 mg Intravenous Q8H  . multivitamin with minerals  1 tablet Per Tube Daily  . sodium chloride flush  3 mL Intravenous Q12H  . vitamin C  500 mg Per Tube Daily  . zinc sulfate  220 mg Per Tube Daily   Continuous: . sodium chloride 75 mL/hr at 10/24/18 0700  . azithromycin Stopped (10/23/18 2222)  . cefTRIAXone (ROCEPHIN)  IV Stopped (10/23/18 2323)  . fentaNYL infusion INTRAVENOUS    . norepinephrine (LEVOPHED) Adult infusion Stopped (10/22/18 2115)  . propofol (DIPRIVAN) infusion 50 mcg/kg/min (10/24/18 0819)  . remdesivir 100 mg in NS 250 mL     WLK:HVFMBBUYZ,  docusate, fentaNYL, fentaNYL (SUBLIMAZE) injection, midazolam   Objective:  Vital Signs  Vitals:   10/24/18 0725 10/24/18 0751 10/24/18 0800 10/24/18 0820  BP: 101/65  111/69   Pulse: 70  67   Resp: (!) 23  (!) 29   Temp:  97.7 F (36.5 C)    TempSrc:  Axillary    SpO2: 91%  (!) 88% (!) 88%  Weight:      Height:        Intake/Output Summary (Last 24 hours) at 10/24/2018 1122 Last data filed at 10/24/2018 0800 Gross per 24 hour  Intake 3747.73 ml  Output 2680 ml  Net 1067.73 ml   Filed Weights   10/22/18 1609 10/23/18 0500 10/24/18 0442  Weight: 99.8 kg 94.7 kg 94.9 kg   General appearance: Patient intubated and sedated. Resp: Coarse breath sounds bilaterally.  No  wheezing rales or rhonchi.   Cardio: S1-S2 is normal regular.  No S3-S4.  No rubs murmurs or bruit GI: Abdomen is soft.  Nontender nondistended.  Bowel sounds are present normal.  No masses organomegaly Extremities: No edema.   Neurologic: Intubated and sedated   Lab Results:  Data Reviewed: I have personally reviewed following labs and imaging studies  CBC: Recent Labs  Lab 10/22/18 1611 10/22/18 1744 10/23/18 0652 10/24/18 0419 10/24/18 0920  WBC 16.4*  --  13.5*  --  22.4*  NEUTROABS 14.2*  --  11.3*  --  19.9*  HGB 13.7 13.6 12.0 12.9 12.5  HCT 45.5 40.0 39.8 38.0 40.7  MCV 91.0  --  90.0  --  91.1  PLT 256  --  219  --  983    Basic Metabolic Panel: Recent Labs  Lab 10/22/18 1612 10/22/18 1744 10/23/18 0652 10/23/18 1443 10/24/18 0419  NA 135 137 138  --  144  K 3.8 3.5 4.6  --  3.2*  CL 98  --  105  --   --   CO2 26  --  21*  --   --   GLUCOSE 156*  --  103*  --   --   BUN 23*  --  22*  --   --   CREATININE 0.80  --  0.80  --   --   CALCIUM 8.7*  --  8.4*  --   --   MG  --   --  2.7* 2.4  --   PHOS  --   --  2.9 2.6  --     GFR: Estimated Creatinine Clearance: 88.8 mL/min (by C-G formula based on SCr of 0.8 mg/dL).  Liver Function Tests: Recent Labs  Lab 10/22/18 1612  10/23/18 0652  AST 112* 101*  ALT 58* 51*  ALKPHOS 203* 161*  BILITOT 0.5 1.1  PROT 8.4* 6.8  ALBUMIN 3.2* 2.6*    Recent Labs  Lab 10/22/18 1612  LIPASE 26    Cardiac Enzymes: Recent Labs  Lab 10/22/18 1625 10/23/18 0652  TROPONINI 0.04* 0.03*     Lipid Profile: Recent Labs    10/23/18 0652  TRIG 353*    Anemia Panel: Recent Labs    10/23/18 0650  FERRITIN 703*    Recent Results (from the past 240 hour(s))  SARS Coronavirus 2 (Hosp order,Performed in [place] lab via [employer] [facility])     Status: Abnormal   Collection Time: 10/22/18  4:16 PM  Result Value Ref Range Status   SARS Coronavirus 2 ([facility] [facility] Now) POSITIVE (A) NEGATIVE Final    Comment: RESULT CALLED TO, READ BACK BY AND VERIFIED WITH: CALLED TO Gordon RN AT 1650 BY SROY ON [id number] (NOTE) Interpretive Result Comment(s): COVID 19 Positive SARS CoV 2 target nucleic acids are DETECTED. The SARS CoV 2 RNA is generally detectable in upper and lower respiratory specimens during the acute phase of infection.  Positive results are indicative of active infection with SARS CoV 2.  Clinical correlation with patient history and other diagnostic information is necessary to determine patient infection status.  Positive results do not rule out bacterial infection or coinfection with other viruses. The expected result is Negative. COVID 19 Negative SARS CoV 2 target nucleic acids are NOT DETECTED. The SARS CoV 2 RNA is generally detectable in upper and lower respiratory specimens during the acute phase of infection.  Negative results do not preclude SARS CoV 2 infection, do not rule out  coinfections with other pathogens, and should not be used as the sole basis for  treatment or other patient management decisions.  Negative results must be combined with clinical observations, patient history, and epidemiological information. The expected result is Negative. Invalid Presence or absence of SARS CoV 2 nucleic  acids cannot be determined. Repeat testing was performed on the submitted specimen and repeated Invalid results were obtained.  If clinically indicated, additional testing on a new specimen with an alternate test methodology [phone]) is advised.  The SARS CoV 2 RNA is generally detectable in upper and lower respiratory specimens during the acute phase of infection. The expected result is Negative. Fact Sheet for Patients:  GolfingFamily.no Fact Sheet for Healthcare Providers: https://www.hernandez-brewer.com/ This test is not yet approved or cleared by the [place] FDA and has been authorized for detection and/or diagnosis of SARS CoV 2 by FDA under an Emergency Use Authorization (EUA).   This EUA will remain in effect (meaning this test can be used) for the duration of the COVID19 declaration under Section 564(b)(1) of the Act, 21 U.S.C. section [phone] 3(b)(1), unless the authorization is terminated or revoked sooner. Performed at [facility], [address] [place], [place] [address]   Culture, blood (routine x 2)     Status: None (Preliminary result)   Collection Time: 10/22/18  9:49 PM  Result Value Ref Range Status   Specimen Description BLOOD LEFT HAND  Final   Special Requests   Final    BOTTLES DRAWN AEROBIC ONLY Blood Culture adequate volume   Culture   Final    NO GROWTH 1 DAY Performed at [facility], [place] [address], [place], [place] [address]    Report Status PENDING  Incomplete  Culture, blood (routine x 2)     Status: None (Preliminary result)   Collection Time: 10/22/18  9:54 PM  Result Value Ref Range Status   Specimen Description   Final    BLOOD Performed at [facility] [address], [place], [place] [address]    Special Requests   Final    NONE Performed at [facility], [place] [address], [place], [place] [address]    Culture  Setup Time   Final    GRAM  POSITIVE COCCI IN CLUSTERS AEROBIC BOTTLE ONLY CRITICAL RESULT CALLED TO, READ BACK BY AND VERIFIED WITH: Sandria Bales [id number] 10/24/2018 Mena Goes Performed at [facility], [place] [address], [place], [place] [address]    Culture GRAM POSITIVE COCCI  Final   Report Status PENDING  Incomplete  Blood Culture ID Panel (Reflexed)     Status: Abnormal   Collection Time: 10/22/18  9:54 PM  Result Value Ref Range Status   Enterococcus species NOT DETECTED NOT DETECTED Final   Listeria monocytogenes NOT DETECTED NOT DETECTED Final   Staphylococcus species DETECTED (A) NOT DETECTED Final    Comment: Methicillin (oxacillin) susceptible coagulase negative staphylococcus. Possible blood culture contaminant (unless isolated from more than one blood culture draw or clinical case suggests pathogenicity). No antibiotic treatment is indicated for blood  culture contaminants. CRITICAL RESULT CALLED TO, READ BACK BY AND VERIFIED WITH: N. BATCHELDER,PHARMD [id number] 10/24/2018 T. TYSOR    Staphylococcus aureus (BCID) NOT DETECTED NOT DETECTED Final   Methicillin resistance NOT DETECTED NOT DETECTED Final   Streptococcus species NOT DETECTED NOT DETECTED Final   Streptococcus agalactiae NOT DETECTED NOT DETECTED Final   Streptococcus pneumoniae NOT DETECTED NOT DETECTED Final  Streptococcus pyogenes NOT DETECTED NOT DETECTED Final   Acinetobacter baumannii NOT DETECTED NOT DETECTED Final   Enterobacteriaceae species NOT DETECTED NOT DETECTED Final   Enterobacter cloacae complex NOT DETECTED NOT DETECTED Final   Escherichia coli NOT DETECTED NOT DETECTED Final   Klebsiella oxytoca NOT DETECTED NOT DETECTED Final   Klebsiella pneumoniae NOT DETECTED NOT DETECTED Final   Proteus species NOT DETECTED NOT DETECTED Final   Serratia marcescens NOT DETECTED NOT DETECTED Final   Haemophilus influenzae NOT DETECTED NOT DETECTED Final   Neisseria meningitidis NOT DETECTED NOT DETECTED Final   Pseudomonas  aeruginosa NOT DETECTED NOT DETECTED Final   Candida albicans NOT DETECTED NOT DETECTED Final   Candida glabrata NOT DETECTED NOT DETECTED Final   Candida krusei NOT DETECTED NOT DETECTED Final   Candida parapsilosis NOT DETECTED NOT DETECTED Final   Candida tropicalis NOT DETECTED NOT DETECTED Final    Comment: Performed at [facility], [place] [address], [place], [place] [address]      Radiology Studies: Dg Chest Portable 1 View  Result Date: 10/22/2018 CLINICAL DATA:  Tube placement EXAM: PORTABLE CHEST 1 VIEW COMPARISON:  10/22/2018 FINDINGS: The endotracheal tube terminates above the carina by approximately 2.7 cm. The enteric tube extends below the left hemidiaphragm. The cardiac silhouette is mildly enlarged. There are increasing perihilar airspace opacities and prominent interstitial lung markings. There is a worsening retrocardiac/left basilar airspace opacity. IMPRESSION: 1. Lines and tubes as above. 2. Worsening perihilar airspace opacities and prominent interstitial lung markings can be seen in patients with atypical pneumonia. 3. Worsening retrocardiac/left basilar airspace opacity concerning for aspiration or developing consolidation. Electronically Signed   By: Constance Holster M.D.   On: 10/22/2018 19:02   Dg Chest Portable 1 View  Result Date: 10/22/2018 CLINICAL DATA:  Fever, cough. EXAM: PORTABLE CHEST 1 VIEW COMPARISON:  None. FINDINGS: The heart size and mediastinal contours are within normal limits. Both lungs are clear. The visualized skeletal structures are unremarkable. IMPRESSION: No active disease. Electronically Signed   By: Marijo Conception M.D.   On: 10/22/2018 16:46       LOS: 2 days   [facility] Pager on [email or web address]  10/24/2018, 11:22 AM

## 2018-10-24 NOTE — Progress Notes (Signed)
PHARMACY - PHYSICIAN COMMUNICATION CRITICAL VALUE ALERT - BLOOD CULTURE IDENTIFICATION (BCID)  Shannon Meyer is an 53 y.o. female who presented to [facility] on 10/22/2018 with a chief complaint of SOB and not eating well.  Assessment:   53 yo F found to be COVID-19 positive. 1/2 showing staph species. Probable contaminant.  Name of physician (or Provider) Contacted: Vania Rea  Current antibiotics: Rocephin and azithromycin  Changes to prescribed antibiotics recommended:  No changes needed Continue Rocephin and azithromycin Monitor clinical progress  PHARMACY - PHYSICIAN COMMUNICATION CRITICAL VALUE ALERT - BLOOD CULTURE IDENTIFICATION (BCID)  Results for orders placed or performed during the hospital encounter of 10/22/18  Blood Culture ID Panel (Reflexed) (Collected: 10/22/2018  9:54 PM)  Result Value Ref Range   Enterococcus species NOT DETECTED NOT DETECTED   Listeria monocytogenes NOT DETECTED NOT DETECTED   Staphylococcus species DETECTED (A) NOT DETECTED   Staphylococcus aureus (BCID) NOT DETECTED NOT DETECTED   Methicillin resistance NOT DETECTED NOT DETECTED   Streptococcus species NOT DETECTED NOT DETECTED   Streptococcus agalactiae NOT DETECTED NOT DETECTED   Streptococcus pneumoniae NOT DETECTED NOT DETECTED   Streptococcus pyogenes NOT DETECTED NOT DETECTED   Acinetobacter baumannii NOT DETECTED NOT DETECTED   Enterobacteriaceae species NOT DETECTED NOT DETECTED   Enterobacter cloacae complex NOT DETECTED NOT DETECTED   Escherichia coli NOT DETECTED NOT DETECTED   Klebsiella oxytoca NOT DETECTED NOT DETECTED   Klebsiella pneumoniae NOT DETECTED NOT DETECTED   Proteus species NOT DETECTED NOT DETECTED   Serratia marcescens NOT DETECTED NOT DETECTED   Haemophilus influenzae NOT DETECTED NOT DETECTED   Neisseria meningitidis NOT DETECTED NOT DETECTED   Pseudomonas aeruginosa NOT DETECTED NOT DETECTED   Candida albicans NOT DETECTED NOT DETECTED   Candida glabrata  NOT DETECTED NOT DETECTED   Candida krusei NOT DETECTED NOT DETECTED   Candida parapsilosis NOT DETECTED NOT DETECTED   Candida tropicalis NOT DETECTED NOT DETECTED   Enzo Bi, PharmD, BCPS, BCIDP Clinical Pharmacist 10/24/2018 5:48 AM

## 2018-10-24 NOTE — Progress Notes (Signed)
Patient's daughter [name]) called to get update on patient. Update given and daughter also reminded about policy of only speaking to designated point of contact daily for patient status. Daughter wants to setup facetime tomorrow morning at 0930 to see patient. All questions were answered.

## 2018-10-24 NOTE — Progress Notes (Addendum)
PROGRESS NOTE  Shannon Meyer LDJ:[id number] DOB: March 27, 1966 DOA: 10/22/2018  PCP: No primary care provider on file.  Brief History/Interval Summary: 53 year old female with unknown past medical history presented with 5-day history of weakness cough shortness of breath.  She was found to be significantly hypoxic when she was evaluated in the emergency department.  She was positive for COVID-19.  She was intubated and then transferred to the hospital for further management.  Reason for Visit: Acute respiratory failure with hypoxia secondary to COVID-19  Consultants: Pulmonology  Procedures: Intubation  Antibiotics: Anti-infectives (From admission, onward)   Start     Dose/Rate Route Frequency Ordered Stop   10/24/18 1400  remdesivir 100 mg in sodium chloride 0.9 % 250 mL IVPB     100 mg over 30 Minutes Intravenous Every 24 hours 10/23/18 1153 11/02/18 1359   10/23/18 1400  remdesivir 200 mg in sodium chloride 0.9 % 250 mL IVPB     200 mg over 30 Minutes Intravenous Once 10/23/18 1153 10/23/18 1738   10/22/18 2200  azithromycin (ZITHROMAX) 500 mg in sodium chloride 0.9 % 250 mL IVPB     500 mg 250 mL/hr over 60 Minutes Intravenous Every 24 hours 10/22/18 2148     10/22/18 2200  cefTRIAXone (ROCEPHIN) 1 g in sodium chloride 0.9 % 100 mL IVPB     1 g 200 mL/hr over 30 Minutes Intravenous Every 24 hours 10/22/18 2148        Subjective/Interval History: Patient remains intubated sedated.    Assessment/Plan:  Acute Hypoxic Resp. Failure due to Acute Covid 19 Viral Illness  Vent Mode: PRVC FiO2 (%):  [40 %-50 %] 50 % Set Rate:  [24 bmp] 24 bmp Vt Set:  [330 mL] 330 mL PEEP:  [5 cmH20-8 cmH20] 8 cmH20 Plateau Pressure:  [21 cmH20-23 cmH20] 21 cmH20     Component Value Date/Time   PHART 7.386 10/24/2018 0419   PCO2ART 39.2 10/24/2018 0419   PO2ART 63.0 (L) 10/24/2018 0419   HCO3 23.6 10/24/2018 0419   TCO2 25 10/24/2018 0419   ACIDBASEDEF 1.0 10/24/2018 0419   O2SAT 92.0  10/24/2018 0419    COVID-19 Labs  Recent Labs    10/23/18 0100 10/23/18 0650 10/23/18 0651 10/23/18 0652 10/24/18 0920  DDIMER 2.00*  --   --  1.57* 1.13*  FERRITIN  --  703*  --   --   --   CRP  --   --  26.5*  --  21.9*    Lab Results  Component Value Date   SARSCOV2NAA POSITIVE (A) 10/22/2018     Fever: Afebrile since she has been in the hospital. Oxygen requirements: Remains on mechanical ventilation.  50% FiO2.  Saturating in the 90s for the most part.   Antibiotics: Ceftriaxone and azithromycin Steroids: Solu-Medrol 40 mg every 8 hours Diuretics: Lasix 68m x 1 on 5/21 Actemra: 2 doses have been given, on 5/21. Remdesivir: Initiated on 5/21 Convalescent Plasma: Not yet Vitamin C and Zinc: Continue  Patient remains on mechanical ventilation.  Pulmonology is following.  Received Lasix x1 yesterday.  WBC noted to be significantly elevated today probably due to steroids.  Other labs are still pending.  Inflammatory markers as above.  Patient has received 2 doses of Actemra.  She is on Solu-Medrol.  Continue Remdesivir.  Chest x-ray to be repeated this morning.  One set of blood cultures growing coag negative staph.  This is most likely contaminant.   The treatment plan and use of medications and  known side effects were discussed with patient/family. It was clearly explained that there is no proven definitive treatment for COVID-19 infection yet. Any medications used here are based on case reports/anecdotal data which are not peer-reviewed and has not been studied using randomized control trials.  Complete risks and long-term side effects are unknown, however in the best clinical judgment they seem to be of some clinical benefit rather than medical risks.  Patient/family agree with the treatment plan and want to receive these treatments as indicated.  Patient was given Actemra and steroids.  Sepsis present on admission Soft blood pressures were noted in the emergency  department.  Could be due to propofol.  Levophed was ordered but has not required it.  Continue empiric ceftriaxone and azithromycin.  Follow-up cultures.  Follow-up procalcitonin level.  Blood pressures have improved.  Lactic acid level 1.8.  Leukocytosis Patient noted to have leukocytosis even at admission.  Increases most likely due to steroids.  Procalcitonin level was 0.37 yesterday.  Follow-up level pending.    Transaminitis Mildly elevated LFTs noted.  Monitor on a daily basis.  Apparently a previous history of alcohol use is mentioned.  Continue to trend LFTs.  QT prolongation Noted to be 490 ms.  Avoid QT prolonging agents as much as possible.  FEN Currently on maintenance IV fluids.  Continue tube feedings.  Monitor electrolytes.   DVT Prophylaxis:  Lovenox 40 mg every 12 hours  Lab Results  Component Value Date   PLT 299 10/24/2018     PUD Prophylaxis: Famotidine Code Status: Full code Family Communication: Discussed with her daughter. Disposition Plan: Will remain in ICU   Medications:  Scheduled: . chlorhexidine gluconate (MEDLINE KIT)  15 mL Mouth Rinse BID  . Chlorhexidine Gluconate Cloth  6 each Topical Daily  . enoxaparin (LOVENOX) injection  40 mg Subcutaneous Q12H  . famotidine  20 mg Per Tube Q12H  . feeding supplement (PRO-STAT SUGAR FREE 64)  60 mL Per Tube Daily  . feeding supplement (VITAL HIGH PROTEIN)  1,000 mL Per Tube Q24H  . mouth rinse  15 mL Mouth Rinse 10 times per day  . methylPREDNISolone (SOLU-MEDROL) injection  40 mg Intravenous Q8H  . multivitamin with minerals  1 tablet Per Tube Daily  . sodium chloride flush  3 mL Intravenous Q12H  . vitamin C  500 mg Per Tube Daily  . zinc sulfate  220 mg Per Tube Daily   Continuous: . sodium chloride 10 mL/hr at 10/24/18 1306  . azithromycin Stopped (10/23/18 2222)  . cefTRIAXone (ROCEPHIN)  IV Stopped (10/23/18 2323)  . fentaNYL infusion INTRAVENOUS 75 mcg/hr (10/24/18 1127)  . norepinephrine  (LEVOPHED) Adult infusion Stopped (10/22/18 2115)  . propofol (DIPRIVAN) infusion 20 mcg/kg/min (10/24/18 1133)  . remdesivir 100 mg in NS 250 mL     YSH:UOHFGBMSXJDBZ (TYLENOL) oral liquid 160 mg/5 mL, bisacodyl, docusate, fentaNYL, fentaNYL (SUBLIMAZE) injection, midazolam   Objective:  Vital Signs  Vitals:   10/24/18 0751 10/24/18 0800 10/24/18 0820 10/24/18 1157  BP:  111/69  (!) 96/57  Pulse:  67  80  Resp:  (!) 29  (!) 31  Temp: 97.7 F (36.5 C)     TempSrc: Axillary     SpO2:  (!) 88% (!) 88% (!) 89%  Weight:      Height:        Intake/Output Summary (Last 24 hours) at 10/24/2018 1326 Last data filed at 10/24/2018 0800 Gross per 24 hour  Intake 3747.73 ml  Output 2680  ml  Net 1067.73 ml   Filed Weights   10/22/18 1609 10/23/18 0500 10/24/18 0442  Weight: 99.8 kg 94.7 kg 94.9 kg   General appearance: Patient intubated and sedated. Resp: Coarse breath sounds bilaterally.  No wheezing rales or rhonchi.   Cardio: S1-S2 is normal regular.  No S3-S4.  No rubs murmurs or bruit GI: Abdomen is soft.  Nontender nondistended.  Bowel sounds are present normal.  No masses organomegaly Extremities: No edema.   Neurologic: Intubated and sedated   Lab Results:  Data Reviewed: I have personally reviewed following labs and imaging studies  CBC: Recent Labs  Lab 10/22/18 1611 10/22/18 1744 10/23/18 0652 10/24/18 0419 10/24/18 0920  WBC 16.4*  --  13.5*  --  22.4*  NEUTROABS 14.2*  --  11.3*  --  19.9*  HGB 13.7 13.6 12.0 12.9 12.5  HCT 45.5 40.0 39.8 38.0 40.7  MCV 91.0  --  90.0  --  91.1  PLT 256  --  219  --  767    Basic Metabolic Panel: Recent Labs  Lab 10/22/18 1612 10/22/18 1744 10/23/18 0652 10/23/18 1443 10/24/18 0419 10/24/18 0920  NA 135 137 138  --  144 143  K 3.8 3.5 4.6  --  3.2* 3.0*  CL 98  --  105  --   --  110  CO2 26  --  21*  --   --  22  GLUCOSE 156*  --  103*  --   --  140*  BUN 23*  --  22*  --   --  29*  CREATININE 0.80  --  0.80   --   --  0.78  CALCIUM 8.7*  --  8.4*  --   --  8.4*  MG  --   --  2.7* 2.4  --  2.4  PHOS  --   --  2.9 2.6  --  2.8    GFR: Estimated Creatinine Clearance: 88.8 mL/min (by C-G formula based on SCr of 0.78 mg/dL).  Liver Function Tests: Recent Labs  Lab 10/22/18 1612 10/23/18 0652 10/24/18 0920  AST 112* 101* 76*  ALT 58* 51* 48*  ALKPHOS 203* 161* 162*  BILITOT 0.5 1.1 0.2*  PROT 8.4* 6.8 6.8  ALBUMIN 3.2* 2.6* 2.4*    Recent Labs  Lab 10/22/18 1612  LIPASE 26    Cardiac Enzymes: Recent Labs  Lab 10/22/18 1625 10/23/18 0652  TROPONINI 0.04* 0.03*     Lipid Profile: Recent Labs    10/23/18 0652 10/24/18 0920  TRIG 353* 317*    Anemia Panel: Recent Labs    10/23/18 0650  FERRITIN 703*    Recent Results (from the past 240 hour(s))  SARS Coronavirus 2 (Hosp order,Performed in [facility] lab via [employer] [facility])     Status: Abnormal   Collection Time: 10/22/18  4:16 PM  Result Value Ref Range Status   SARS Coronavirus 2 ([employer] [facility] Now) POSITIVE (A) NEGATIVE Final    Comment: RESULT CALLED TO, READ BACK BY AND VERIFIED WITH: CALLED TO [place] RN AT 1650 BY SROY ON [id number] (NOTE) Interpretive Result Comment(s): COVID 19 Positive SARS CoV 2 target nucleic acids are DETECTED. The SARS CoV 2 RNA is generally detectable in upper and lower respiratory specimens during the acute phase of infection.  Positive results are indicative of active infection with SARS CoV 2.  Clinical correlation with patient history and other diagnostic information is necessary to determine patient infection status.  Positive  results do not rule out bacterial infection or coinfection with other viruses. The expected result is Negative. COVID 19 Negative SARS CoV 2 target nucleic acids are NOT DETECTED. The SARS CoV 2 RNA is generally detectable in upper and lower respiratory specimens during the acute phase of infection.  Negative results do not preclude SARS CoV 2 infection, do  not rule out coinfections with other pathogens, and should not be used as the sole basis for  treatment or other patient management decisions.  Negative results must be combined with clinical observations, patient history, and epidemiological information. The expected result is Negative. Invalid Presence or absence of SARS CoV 2 nucleic acids cannot be determined. Repeat testing was performed on the submitted specimen and repeated Invalid results were obtained.  If clinically indicated, additional testing on a new specimen with an alternate test methodology [phone]) is advised.  The SARS CoV 2 RNA is generally detectable in upper and lower respiratory specimens during the acute phase of infection. The expected result is Negative. Fact Sheet for Patients:  GolfingFamily.no Fact Sheet for Healthcare Providers: https://www.hernandez-brewer.com/ This test is not yet approved or cleared by the [place] FDA and has been authorized for detection and/or diagnosis of SARS CoV 2 by FDA under an Emergency Use Authorization (EUA).   This EUA will remain in effect (meaning this test can be used) for the duration of the COVID19 declaration under Section 564(b)(1) of the Act, 21 U.S.C. section [phone] 3(b)(1), unless the authorization is terminated or revoked sooner. Performed at [facility], [address] [place], [place] [address]   Culture, blood (routine x 2)     Status: None (Preliminary result)   Collection Time: 10/22/18  9:49 PM  Result Value Ref Range Status   Specimen Description BLOOD LEFT HAND  Final   Special Requests   Final    BOTTLES DRAWN AEROBIC ONLY Blood Culture adequate volume   Culture   Final    NO GROWTH 1 DAY Performed at [facility], [place] [address], [place], [place] [address]    Report Status PENDING  Incomplete  Culture, blood (routine x 2)     Status: None (Preliminary result)   Collection Time:  10/22/18  9:54 PM  Result Value Ref Range Status   Specimen Description   Final    BLOOD Performed at [place] [address] [place], [place] [address]    Special Requests   Final    NONE Performed at [facility], [place] [address] [place], [place] [address]    Culture  Setup Time   Final    GRAM POSITIVE COCCI IN CLUSTERS AEROBIC BOTTLE ONLY CRITICAL RESULT CALLED TO, READ BACK BY AND VERIFIED WITH: Sandria Bales 1950 10/24/2018 Mena Goes Performed at [facility], [place] [address], [place], [place] [address]    Culture GRAM POSITIVE COCCI  Final   Report Status PENDING  Incomplete  Blood Culture ID Panel (Reflexed)     Status: Abnormal   Collection Time: 10/22/18  9:54 PM  Result Value Ref Range Status   Enterococcus species NOT DETECTED NOT DETECTED Final   Listeria monocytogenes NOT DETECTED NOT DETECTED Final   Staphylococcus species DETECTED (A) NOT DETECTED Final    Comment: Methicillin (oxacillin) susceptible coagulase negative staphylococcus. Possible blood culture contaminant (unless isolated from more than one blood culture draw or clinical case suggests pathogenicity). No antibiotic treatment is indicated for blood  culture contaminants. CRITICAL  RESULT CALLED TO, READ BACK BY AND VERIFIED WITH: N. BATCHELDER,PHARMD 0543 10/24/2018 T. TYSOR    Staphylococcus aureus (BCID) NOT DETECTED NOT DETECTED Final   Methicillin resistance NOT DETECTED NOT DETECTED Final   Streptococcus species NOT DETECTED NOT DETECTED Final   Streptococcus agalactiae NOT DETECTED NOT DETECTED Final   Streptococcus pneumoniae NOT DETECTED NOT DETECTED Final   Streptococcus pyogenes NOT DETECTED NOT DETECTED Final   Acinetobacter baumannii NOT DETECTED NOT DETECTED Final   Enterobacteriaceae species NOT DETECTED NOT DETECTED Final   Enterobacter cloacae complex NOT DETECTED NOT DETECTED Final   Escherichia coli NOT DETECTED NOT DETECTED Final    Klebsiella oxytoca NOT DETECTED NOT DETECTED Final   Klebsiella pneumoniae NOT DETECTED NOT DETECTED Final   Proteus species NOT DETECTED NOT DETECTED Final   Serratia marcescens NOT DETECTED NOT DETECTED Final   Haemophilus influenzae NOT DETECTED NOT DETECTED Final   Neisseria meningitidis NOT DETECTED NOT DETECTED Final   Pseudomonas aeruginosa NOT DETECTED NOT DETECTED Final   Candida albicans NOT DETECTED NOT DETECTED Final   Candida glabrata NOT DETECTED NOT DETECTED Final   Candida krusei NOT DETECTED NOT DETECTED Final   Candida parapsilosis NOT DETECTED NOT DETECTED Final   Candida tropicalis NOT DETECTED NOT DETECTED Final    Comment: Performed at [facility], [address] [place], [place] [address]      Radiology Studies: Dg Chest Port 1 View  Result Date: 10/24/2018 CLINICAL DATA:  Pneumonia.  COVID-19 infection. EXAM: PORTABLE CHEST 1 VIEW COMPARISON:  Radiographs 10/22/2018. FINDINGS: 1032 hours. The endotracheal tube is in stable position. Nasogastric tube projects below the diaphragm, likely looped in the stomach with the tip projecting superiorly into the gastric fundus. The heart size and mediastinal contours are stable. There are diffuse ground-glass opacities in both lungs with stable asymmetric left lower lobe consolidation. There are possible small bilateral pleural effusions. No pneumothorax. The bones appear unchanged. IMPRESSION: Overall similar appearance of the lungs with diffuse ground-glass opacities and left lower lobe consolidation. Stable support system. Electronically Signed   By: Richardean Sale M.D.   On: 10/24/2018 11:55   Dg Chest Portable 1 View  Result Date: 10/22/2018 CLINICAL DATA:  Tube placement EXAM: PORTABLE CHEST 1 VIEW COMPARISON:  10/22/2018 FINDINGS: The endotracheal tube terminates above the carina by approximately 2.7 cm. The enteric tube extends below the left hemidiaphragm. The cardiac silhouette is mildly enlarged. There are  increasing perihilar airspace opacities and prominent interstitial lung markings. There is a worsening retrocardiac/left basilar airspace opacity. IMPRESSION: 1. Lines and tubes as above. 2. Worsening perihilar airspace opacities and prominent interstitial lung markings can be seen in patients with atypical pneumonia. 3. Worsening retrocardiac/left basilar airspace opacity concerning for aspiration or developing consolidation. Electronically Signed   By: Constance Holster M.D.   On: 10/22/2018 19:02   Dg Chest Portable 1 View  Result Date: 10/22/2018 CLINICAL DATA:  Fever, cough. EXAM: PORTABLE CHEST 1 VIEW COMPARISON:  None. FINDINGS: The heart size and mediastinal contours are within normal limits. Both lungs are clear. The visualized skeletal structures are unremarkable. IMPRESSION: No active disease. Electronically Signed   By: Marijo Conception M.D.   On: 10/22/2018 16:46       LOS: 2 days   [facility] Pager on [email or web address]  10/24/2018, 1:26 PM

## 2018-10-24 NOTE — Progress Notes (Signed)
Patient's daughter called to get an update. All questions were answered.

## 2018-10-24 NOTE — Progress Notes (Signed)
Spoke with sister from [place] and updated her regarding pt's status. She was aware we were drawing labs and doing a repeat chest x-ray so she called back later in the day inquiring about the results. Family member was notified about protocols regarding multiple phone calls and was very understanding.

## 2018-10-24 NOTE — Plan of Care (Signed)

## 2018-10-25 ENCOUNTER — Inpatient Hospital Stay (HOSPITAL_COMMUNITY): Payer: HRSA Program

## 2018-10-25 ENCOUNTER — Inpatient Hospital Stay: Payer: Self-pay

## 2018-10-25 LAB — COMPREHENSIVE METABOLIC PANEL
ALT: 39 U/L (ref 0–44)
AST: 47 U/L — ABNORMAL HIGH (ref 15–41)
Albumin: 2.2 g/dL — ABNORMAL LOW (ref 3.5–5.0)
Alkaline Phosphatase: 139 U/L — ABNORMAL HIGH (ref 38–126)
Anion gap: 8 (ref 5–15)
BUN: 48 mg/dL — ABNORMAL HIGH (ref 6–20)
CO2: 22 mmol/L (ref 22–32)
Calcium: 8.3 mg/dL — ABNORMAL LOW (ref 8.9–10.3)
Chloride: 116 mmol/L — ABNORMAL HIGH (ref 98–111)
Creatinine, Ser: 0.81 mg/dL (ref 0.44–1.00)
GFR calc Af Amer: >60 mL/min (ref >60)
GFR calc non Af Amer: >60 mL/min (ref >60)
Glucose, Bld: 124 mg/dL — ABNORMAL HIGH (ref 70–99)
Potassium: 4.3 mmol/L (ref 3.5–5.1)
Sodium: 146 mmol/L — ABNORMAL HIGH (ref 135–145)
Total Bilirubin: <0.1 mg/dL — ABNORMAL LOW (ref 0.3–1.2)
Total Protein: 6.2 g/dL — ABNORMAL LOW (ref 6.5–8.1)

## 2018-10-25 LAB — C-REACTIVE PROTEIN: CRP: 13.8 mg/dL — ABNORMAL HIGH (ref <1.0)

## 2018-10-25 LAB — GLUCOSE, CAPILLARY
Glucose-Capillary: 92 mg/dL (ref 70–99)
Glucose-Capillary: 108 mg/dL — ABNORMAL HIGH (ref 70–99)
Glucose-Capillary: 113 mg/dL — ABNORMAL HIGH (ref 70–99)
Glucose-Capillary: 136 mg/dL — ABNORMAL HIGH (ref 70–99)
Glucose-Capillary: 120 mg/dL — ABNORMAL HIGH (ref 70–99)
Glucose-Capillary: 104 mg/dL — ABNORMAL HIGH (ref 70–99)

## 2018-10-25 LAB — CBC WITH DIFFERENTIAL/PLATELET
Abs Immature Granulocytes: 0.55 10*3/uL — ABNORMAL HIGH (ref 0.00–0.07)
Basophils Absolute: 0 10*3/uL (ref 0.0–0.1)
Basophils Relative: 0 %
Eosinophils Absolute: 0 10*3/uL (ref 0.0–0.5)
Eosinophils Relative: 0 %
HCT: 38 % (ref 36.0–46.0)
Hemoglobin: 11.2 g/dL — ABNORMAL LOW (ref 12.0–15.0)
Immature Granulocytes: 2 %
Lymphocytes Relative: 7 %
Lymphs Abs: 2 10*3/uL (ref 0.7–4.0)
MCH: 27.3 pg (ref 26.0–34.0)
MCHC: 29.5 g/dL — ABNORMAL LOW (ref 30.0–36.0)
MCV: 92.7 fL (ref 80.0–100.0)
Monocytes Absolute: 0.9 10*3/uL (ref 0.1–1.0)
Monocytes Relative: 3 %
Neutro Abs: 24.2 10*3/uL — ABNORMAL HIGH (ref 1.7–7.7)
Neutrophils Relative %: 88 %
Platelets: 348 10*3/uL (ref 150–400)
RBC: 4.1 MIL/uL (ref 3.87–5.11)
RDW: 15.9 % — ABNORMAL HIGH (ref 11.5–15.5)
WBC: 27.7 10*3/uL — ABNORMAL HIGH (ref 4.0–10.5)
nRBC: 0.1 % (ref 0.0–0.2)

## 2018-10-25 LAB — D-DIMER, QUANTITATIVE: D-Dimer, Quant: 1 ug/mL-FEU — ABNORMAL HIGH (ref 0.00–0.50)

## 2018-10-25 LAB — TRIGLYCERIDES: Triglycerides: 139 mg/dL (ref <150)

## 2018-10-25 LAB — MRSA PCR SCREENING: MRSA by PCR: NEGATIVE

## 2018-10-25 LAB — PROCALCITONIN: Procalcitonin: 0.32 ng/mL

## 2018-10-25 MED ORDER — CHLORHEXIDINE GLUCONATE CLOTH 2 % EX PADS
6.0000 | MEDICATED_PAD | Freq: Every day | CUTANEOUS | Status: DC
  Administered 2018-10-25 – 2018-10-27 (×3): 6 via TOPICAL

## 2018-10-25 MED ORDER — FUROSEMIDE 10 MG/ML IJ SOLN
40.0000 mg | Freq: Once | INTRAMUSCULAR | Status: AC
  Administered 2018-10-25: 40 mg via INTRAVENOUS
  Filled 2018-10-25: qty 4

## 2018-10-25 MED ORDER — SODIUM CHLORIDE 0.9% FLUSH: 10.0000 mL | INTRAVENOUS | Status: DC | PRN

## 2018-10-25 MED ORDER — SODIUM CHLORIDE 0.9% FLUSH
10.0000 mL | Freq: Two times a day (BID) | INTRAVENOUS | Status: DC
  Administered 2018-10-25 – 2018-11-01 (×12): 10 mL
  Administered 2018-11-01: 20 mL
  Administered 2018-11-02: 10 mL
  Administered 2018-11-02 – 2018-11-03 (×2): 30 mL
  Administered 2018-11-04 – 2018-11-24 (×27): 10 mL
  Administered 2018-11-25: 30 mL
  Administered 2018-11-26 – 2018-12-08 (×20): 10 mL

## 2018-10-25 MED ORDER — METHYLPREDNISOLONE SODIUM SUCC 40 MG IJ SOLR
40.0000 mg | Freq: Three times a day (TID) | INTRAMUSCULAR | Status: DC
  Administered 2018-10-25 – 2018-10-27 (×7): 40 mg via INTRAVENOUS
  Filled 2018-10-25 (×7): qty 1

## 2018-10-25 NOTE — Progress Notes (Signed)
Patient daughter facetimed with patient. Family updated about status. No major changes to care were made. Family appeared very emotional. Teary, and sad. Support was provided to family member.

## 2018-10-25 NOTE — Progress Notes (Signed)
PROGRESS NOTE  Shannon Meyer IEP:[id number] DOB: April 18, 1966 DOA: 10/22/2018  PCP: No primary care provider on file.  Brief History/Interval Summary:  - 53 year old female with unknown past medical history presented with 5-day history of weakness cough shortness of breath.  She was found to be significantly hypoxic when she was evaluated in the emergency department.  She was positive for COVID-19.  She was intubated and then transferred to the hospital for further management.  Reason for Visit: Acute respiratory failure with hypoxia secondary to COVID-19   Subjective/Interval History: Patient remains intubated sedated.  Slightly hypothermic overnight requiring warming blanket, did require brief low-dose pressor support overnight as well.  Assessment/Plan:  Acute Hypoxic Resp. Failure due to Acute Covid 19 Viral Illness  Vent Mode: PRVC FiO2 (%):  [45 %-50 %] 45 % Set Rate:  [24 bmp] 24 bmp Vt Set:  [330 mL-333 mL] 333 mL PEEP:  [8 cmH20] 8 cmH20 Plateau Pressure:  [21 cmH20-26 cmH20] 26 cmH20     Component Value Date/Time   PHART 7.386 10/24/2018 0419   PCO2ART 39.2 10/24/2018 0419   PO2ART 63.0 (L) 10/24/2018 0419   HCO3 23.6 10/24/2018 0419   TCO2 25 10/24/2018 0419   ACIDBASEDEF 1.0 10/24/2018 0419   O2SAT 92.0 10/24/2018 0419    COVID-19 Labs  Recent Labs    10/23/18 0100 10/23/18 0650 10/23/18 0651 10/23/18 0652 10/24/18 0920  DDIMER 2.00*  --   --  1.57* 1.13*  FERRITIN  --  703*  --   --   --   CRP  --   --  26.5*  --  21.9*    Lab Results  Component Value Date   SARSCOV2NAA POSITIVE (A) 10/22/2018     Fever: Afebrile since she has been hospitalized Oxygen requirements: She remains on mechanical ventilation, 45% FiO2, saturating in the 90s, no significant change in vent requirement over last 24 hours . Antibiotics: Ceftriaxone and azithromycin Steroids: Solu-Medrol 40 mg every 8 hours Diuretics: Lasix 90m x 1 on 5/21 Actemra: 2 doses have been given,  on 5/21. Remdesivir: Initiated on 5/21 Convalescent Plasma: Not yet Vitamin C and Zinc: Continue  Patient remains on mechanical ventilation, vent management per pulmonary, white blood cell count remains elevated, but this is most likely in the setting of steroids, this morning labs are still pending, and received Actemra x2, she remains on IV Solu-Medrol, she is on IV Remdesiver as well .continue to follow inflammatory markers ,  One set of blood cultures growing coag negative staph.  This is most likely contaminant.  The treatment plan and use of medications and known side effects were discussed with patient/family. It was clearly explained that there is no proven definitive treatment for COVID-19 infection yet. Any medications used here are based on case reports/anecdotal data which are not peer-reviewed and has not been studied using randomized control trials.  Complete risks and long-term side effects are unknown, however in the best clinical judgment they seem to be of some clinical benefit rather than medical risks.  Patient/family agree with the treatment plan and want to receive these treatments as indicated.  Patient was given Actemra and steroids. -Repeat chest x-ray this morning still showing severe multilobar pneumonia.  Sepsis present on admission Pressure remains soft, he did require brief pressor support overnight, has been turned off currently, 5 no requirement of pressors today, per discussion with PCCM, they will do some weaning of sedation today, which should improve blood pressure . -Procalcitonin mildly elevated, but stable, continue  empiric ceftriaxone and azithromycin.  Follow-up cultures.  Follow-up procalcitonin level.  Blood pressures have improved.  Lactic acid level 1.8.  Leukocytosis Patient noted to have leukocytosis even at admission.  Trending up, but this is most likely in the setting of steroids, procalcitonin is trending down, continue to monitor closely  .  Transaminitis Mildly elevated LFTs noted.  Monitor on a daily basis.  Apparently a previous history of alcohol use is mentioned.  LFTs trending down, continue to monitor closely especially she received Actemra.  QT prolongation Noted to be 490 ms.  Avoid QT prolonging agents as much as possible.  FEN Currently on maintenance IV fluids.  Continue tube feedings.  Monitor electrolytes.   DVT Prophylaxis:  Lovenox 40 mg every 12 hours  Lab Results  Component Value Date   PLT 299 10/24/2018     PUD Prophylaxis: Famotidine Code Status: Full code Family Communication: Will discuss with daughter today Disposition Plan: Will remain in ICU   Consultants: Pulmonology  Procedures: Intubation  Antibiotics: Anti-infectives (From admission, onward)   Start     Dose/Rate Route Frequency Ordered Stop   10/24/18 1400  remdesivir 100 mg in sodium chloride 0.9 % 250 mL IVPB  Status:  Discontinued     100 mg over 30 Minutes Intravenous Every 24 hours 10/23/18 1153 10/24/18 1335   10/24/18 1400  remdesivir 100 mg in sodium chloride 0.9 % 220 mL IVPB  Status:  Discontinued     100 mg over 30 Minutes Intravenous Every 24 hours 10/24/18 1334 10/24/18 1336   10/24/18 1400  remdesivir 100 mg in sodium chloride 0.9 % 230 mL IVPB     100 mg over 30 Minutes Intravenous Every 24 hours 10/24/18 1335 11/02/18 1359   10/24/18 1400  remdesivir 100 mg in sodium chloride 0.9 % 230 mL IVPB  Status:  Discontinued     100 mg over 30 Minutes Intravenous Every 24 hours 10/24/18 1337 10/24/18 1339   10/23/18 1400  remdesivir 200 mg in sodium chloride 0.9 % 250 mL IVPB     200 mg over 30 Minutes Intravenous Once 10/23/18 1153 10/23/18 1738   10/22/18 2200  azithromycin (ZITHROMAX) 500 mg in sodium chloride 0.9 % 250 mL IVPB     500 mg 250 mL/hr over 60 Minutes Intravenous Every 24 hours 10/22/18 2148     10/22/18 2200  cefTRIAXone (ROCEPHIN) 1 g in sodium chloride 0.9 % 100 mL IVPB     1 g 200 mL/hr over  30 Minutes Intravenous Every 24 hours 10/22/18 2148        Medications:  Scheduled: . chlorhexidine gluconate (MEDLINE KIT)  15 mL Mouth Rinse BID  . Chlorhexidine Gluconate Cloth  6 each Topical Daily  . enoxaparin (LOVENOX) injection  40 mg Subcutaneous Q12H  . famotidine  20 mg Per Tube Q12H  . feeding supplement (PRO-STAT SUGAR FREE 64)  60 mL Per Tube Daily  . feeding supplement (VITAL HIGH PROTEIN)  1,000 mL Per Tube Q24H  . mouth rinse  15 mL Mouth Rinse 10 times per day  . methylPREDNISolone (SOLU-MEDROL) injection  40 mg Intravenous Q8H  . multivitamin with minerals  1 tablet Per Tube Daily  . sodium chloride flush  3 mL Intravenous Q12H  . vitamin C  500 mg Per Tube Daily  . zinc sulfate  220 mg Per Tube Daily   Continuous: . sodium chloride 10 mL/hr at 10/25/18 0700  . azithromycin Stopped (10/24/18 2237)  . cefTRIAXone (ROCEPHIN)  IV 200 mL/hr at 10/24/18 2125  . fentaNYL infusion INTRAVENOUS 100 mcg/hr (10/25/18 0815)  . norepinephrine (LEVOPHED) Adult infusion Stopped (10/24/18 2045)  . propofol (DIPRIVAN) infusion 25 mcg/kg/min (10/25/18 0816)  . remdesivir 100 mg in NS 250 mL     ZGY:FVCBSWHQPRFFM (TYLENOL) oral liquid 160 mg/5 mL, bisacodyl, docusate, fentaNYL, fentaNYL (SUBLIMAZE) injection, midazolam   Objective:  Vital Signs  Vitals:   10/25/18 0600 10/25/18 0630 10/25/18 0700 10/25/18 0807  BP: (!) 85/44 (!) 93/57 (!) 92/53 (!) 88/62  Pulse: 67 64 72 67  Resp: (!) 24 (!) 21 (!) 24 (!) 24  Temp:      TempSrc:      SpO2: 94% 92% 91% 92%  Weight:      Height:        Intake/Output Summary (Last 24 hours) at 10/25/2018 0838 Last data filed at 10/25/2018 0700 Gross per 24 hour  Intake 2503.58 ml  Output 785 ml  Net 1718.58 ml   Filed Weights   10/23/18 0500 10/24/18 0442 10/25/18 0500  Weight: 94.7 kg 94.9 kg 96.4 kg   Sedated, intubated, in no apparent distress Symmetrical Chest wall movement, coarse, no wheezing RRR,No Gallops,Rubs or new  Murmurs, No Parasternal Heave +ve B.Sounds, Abd Soft, No tenderness, No rebound - guarding or rigidity. No Cyanosis, Clubbing or edema, No new Rash or bruise     Lab Results:  Data Reviewed: I have personally reviewed following labs and imaging studies  CBC: Recent Labs  Lab 10/22/18 1611 10/22/18 1744 10/23/18 0652 10/24/18 0419 10/24/18 0920  WBC 16.4*  --  13.5*  --  22.4*  NEUTROABS 14.2*  --  11.3*  --  19.9*  HGB 13.7 13.6 12.0 12.9 12.5  HCT 45.5 40.0 39.8 38.0 40.7  MCV 91.0  --  90.0  --  91.1  PLT 256  --  219  --  384    Basic Metabolic Panel: Recent Labs  Lab 10/22/18 1612 10/22/18 1744 10/23/18 0652 10/23/18 1443 10/24/18 0419 10/24/18 0920 10/24/18 1703  NA 135 137 138  --  144 143  --   K 3.8 3.5 4.6  --  3.2* 3.0*  --   CL 98  --  105  --   --  110  --   CO2 26  --  21*  --   --  22  --   GLUCOSE 156*  --  103*  --   --  140*  --   BUN 23*  --  22*  --   --  29*  --   CREATININE 0.80  --  0.80  --   --  0.78  --   CALCIUM 8.7*  --  8.4*  --   --  8.4*  --   MG  --   --  2.7* 2.4  --  2.4 2.4  PHOS  --   --  2.9 2.6  --  2.8 3.1    GFR: Estimated Creatinine Clearance: 89.6 mL/min (by C-G formula based on SCr of 0.78 mg/dL).  Liver Function Tests: Recent Labs  Lab 10/22/18 1612 10/23/18 0652 10/24/18 0920  AST 112* 101* 76*  ALT 58* 51* 48*  ALKPHOS 203* 161* 162*  BILITOT 0.5 1.1 0.2*  PROT 8.4* 6.8 6.8  ALBUMIN 3.2* 2.6* 2.4*    Recent Labs  Lab 10/22/18 1612  LIPASE 26    Cardiac Enzymes: Recent Labs  Lab 10/22/18 1625 10/23/18 0652  TROPONINI 0.04* 0.03*  Lipid Profile: Recent Labs    10/23/18 0652 10/24/18 0920  TRIG 353* 317*    Anemia Panel: Recent Labs    10/23/18 0650  FERRITIN 703*    Recent Results (from the past 240 hour(s))  SARS Coronavirus 2 (Hosp order,Performed in [facility] lab via [employer] ID)     Status: Abnormal   Collection Time: 10/22/18  4:16 PM  Result Value Ref Range Status    SARS Coronavirus 2 ([employer] ID Now) POSITIVE (A) NEGATIVE Final    Comment: RESULT CALLED TO, READ BACK BY AND VERIFIED WITH: CALLED TO S.GOUGE RN AT 1650 BY SROY ON [id number] (NOTE) Interpretive Result Comment(s): COVID 19 Positive SARS CoV 2 target nucleic acids are DETECTED. The SARS CoV 2 RNA is generally detectable in upper and lower respiratory specimens during the acute phase of infection.  Positive results are indicative of active infection with SARS CoV 2.  Clinical correlation with patient history and other diagnostic information is necessary to determine patient infection status.  Positive results do not rule out bacterial infection or coinfection with other viruses. The expected result is Negative. COVID 19 Negative SARS CoV 2 target nucleic acids are NOT DETECTED. The SARS CoV 2 RNA is generally detectable in upper and lower respiratory specimens during the acute phase of infection.  Negative results do not preclude SARS CoV 2 infection, do not rule out coinfections with other pathogens, and should not be used as the sole basis for  treatment or other patient management decisions.  Negative results must be combined with clinical observations, patient history, and epidemiological information. The expected result is Negative. Invalid Presence or absence of SARS CoV 2 nucleic acids cannot be determined. Repeat testing was performed on the submitted specimen and repeated Invalid results were obtained.  If clinically indicated, additional testing on a new specimen with an alternate test methodology [id number] is advised.  The SARS CoV 2 RNA is generally detectable in upper and lower respiratory specimens during the acute phase of infection. The expected result is Negative. Fact Sheet for Patients:  GolfingFamily.no Fact Sheet for Healthcare Providers: https://www.hernandez-brewer.com/ This test is not yet approved or cleared by the [place] FDA and has been authorized for detection and/or diagnosis of SARS CoV 2 by FDA under an Emergency Use Authorization (EUA).   This EUA will remain in effect (meaning this test can be used) for the duration of the COVID19 declaration under Section 564(b)(1) of the Act, 21 U.S.C. section [phone] 3(b)(1), unless the authorization is terminated or revoked sooner. Performed at [facility], [address] [place], [place] [address]   Culture, blood (routine x 2)     Status: None (Preliminary result)   Collection Time: 10/22/18  9:49 PM  Result Value Ref Range Status   Specimen Description BLOOD LEFT HAND  Final   Special Requests   Final    BOTTLES DRAWN AEROBIC ONLY Blood Culture adequate volume   Culture   Final    NO GROWTH 1 DAY Performed at [facility], [place] [address] [place], [place] [address]    Report Status PENDING  Incomplete  Culture, blood (routine x 2)     Status: None (Preliminary result)   Collection Time: 10/22/18  9:54 PM  Result Value Ref Range Status   Specimen Description   Final    BLOOD Performed at [facility] [address], [place], [place] [address]    Special Requests   Final  NONE Performed at [facility], [place] [address] [place], [place] [address]    Culture  Setup Time   Final    GRAM POSITIVE COCCI IN CLUSTERS AEROBIC BOTTLE ONLY CRITICAL RESULT CALLED TO, READ BACK BY AND VERIFIED WITH: Sandria Bales 0488 10/24/2018 Mena Goes Performed at [facility], [place] [address], [place], [place] [address]    Culture GRAM POSITIVE COCCI  Final   Report Status PENDING  Incomplete  Blood Culture ID Panel (Reflexed)     Status: Abnormal   Collection Time: 10/22/18  9:54 PM  Result Value Ref Range Status   Enterococcus species NOT DETECTED NOT DETECTED Final   Listeria monocytogenes NOT DETECTED NOT DETECTED Final   Staphylococcus species DETECTED (A) NOT DETECTED Final     Comment: Methicillin (oxacillin) susceptible coagulase negative staphylococcus. Possible blood culture contaminant (unless isolated from more than one blood culture draw or clinical case suggests pathogenicity). No antibiotic treatment is indicated for blood  culture contaminants. CRITICAL RESULT CALLED TO, READ BACK BY AND VERIFIED WITH: N. BATCHELDER,PHARMD 0543 10/24/2018 T. TYSOR    Staphylococcus aureus (BCID) NOT DETECTED NOT DETECTED Final   Methicillin resistance NOT DETECTED NOT DETECTED Final   Streptococcus species NOT DETECTED NOT DETECTED Final   Streptococcus agalactiae NOT DETECTED NOT DETECTED Final   Streptococcus pneumoniae NOT DETECTED NOT DETECTED Final   Streptococcus pyogenes NOT DETECTED NOT DETECTED Final   Acinetobacter baumannii NOT DETECTED NOT DETECTED Final   Enterobacteriaceae species NOT DETECTED NOT DETECTED Final   Enterobacter cloacae complex NOT DETECTED NOT DETECTED Final   Escherichia coli NOT DETECTED NOT DETECTED Final   Klebsiella oxytoca NOT DETECTED NOT DETECTED Final   Klebsiella pneumoniae NOT DETECTED NOT DETECTED Final   Proteus species NOT DETECTED NOT DETECTED Final   Serratia marcescens NOT DETECTED NOT DETECTED Final   Haemophilus influenzae NOT DETECTED NOT DETECTED Final   Neisseria meningitidis NOT DETECTED NOT DETECTED Final   Pseudomonas aeruginosa NOT DETECTED NOT DETECTED Final   Candida albicans NOT DETECTED NOT DETECTED Final   Candida glabrata NOT DETECTED NOT DETECTED Final   Candida krusei NOT DETECTED NOT DETECTED Final   Candida parapsilosis NOT DETECTED NOT DETECTED Final   Candida tropicalis NOT DETECTED NOT DETECTED Final    Comment: Performed at [facility], [address] [place],  [address]  MRSA PCR Screening     Status: None   Collection Time: 10/25/18  2:25 AM  Result Value Ref Range Status   MRSA by PCR NEGATIVE NEGATIVE Final    Comment:        The GeneXpert MRSA Assay (FDA approved for NASAL  specimens only), is one component of a comprehensive MRSA colonization surveillance program. It is not intended to diagnose MRSA infection nor to guide or monitor treatment for MRSA infections. Performed at [facility], [place] [address], [place],  [address]       Radiology Studies: Dg Chest Port 1 View  Result Date: 10/25/2018 CLINICAL DATA:  53 year old female with history of acute respiratory failure. EXAM: PORTABLE CHEST 1 VIEW COMPARISON:  Chest x-ray 10/24/2018. FINDINGS: An endotracheal tube is in place with tip 2.2 cm above the carina. Nasogastric tube extending into the stomach. Lung volumes are low. Patchy multifocal ill-defined airspace disease throughout the lungs bilaterally. Air bronchograms in the left lower lobe. Probable small left pleural effusion. No definite evidence of pulmonary edema. Heart size is normal. The patient is rotated to the left  on today's exam, resulting in distortion of the mediastinal contours and reduced diagnostic sensitivity and specificity for mediastinal pathology. IMPRESSION: 1. Support apparatus, as above. 2. Severe multilobar pneumonia redemonstrated. Electronically Signed   By: Vinnie Langton M.D.   On: 10/25/2018 05:46   Dg Chest Port 1 View  Result Date: 10/24/2018 CLINICAL DATA:  Pneumonia.  COVID-19 infection. EXAM: PORTABLE CHEST 1 VIEW COMPARISON:  Radiographs 10/22/2018. FINDINGS: 1032 hours. The endotracheal tube is in stable position. Nasogastric tube projects below the diaphragm, likely looped in the stomach with the tip projecting superiorly into the gastric fundus. The heart size and mediastinal contours are stable. There are diffuse ground-glass opacities in both lungs with stable asymmetric left lower lobe consolidation. There are possible small bilateral pleural effusions. No pneumothorax. The bones appear unchanged. IMPRESSION: Overall similar appearance of the lungs with diffuse ground-glass opacities and  left lower lobe consolidation. Stable support system. Electronically Signed   By: Richardean Sale M.D.   On: 10/24/2018 11:55       LOS: 3 days   Phillips Climes MD  [facility] Pager on [email or web address]  10/25/2018, 8:38 AM

## 2018-10-25 NOTE — Progress Notes (Signed)
NAME:  Shannon BradfordDeborah Schwartzman, MRN:  [id number], DOB:  10/02/1965, LOS: 3 ADMISSION DATE:  10/22/2018, CONSULTATION DATE: Oct 22, 2018 REFERRING MD: Dr. Pilar PlateBero, ER physician, CHIEF COMPLAINT: Dyspnea  Brief History   53 year old [facility] emergency room with fever cough and shortness of breath.  Required intubation for severe hypoxemic respiratory failure.  Noted to be COVID positive.   Past Medical History  No known past medical history  Significant Hospital Events   May 20 admission to [facility]  Consults:  Pulmonary critical care  Procedures:  May 20 ET tube  Significant Diagnostic Tests:    Micro Data:  SARS-CoV-2 May 20> positive Blood culture May 20 > 1 of 2 GPC (staph)>>   Antimicrobials:  Zithromax 5/20 >>  Rocephin 5/20 >>   Interim history/subjective:  More sedated.  FiO2 40%, PEEP 8   Objective   Blood pressure 125/71, pulse 98, temperature 97.9 F (36.6 C), temperature source Axillary, resp. rate (!) 30, height 5\' 4"  (1.626 m), weight 96.4 kg, SpO2 94 %.    Vent Mode: PRVC FiO2 (%):  [40 %-50 %] 40 % Set Rate:  [24 bmp] 24 bmp Vt Set:  [330 mL] 330 mL PEEP:  [8 cmH20] 8 cmH20 Plateau Pressure:  [21 cmH20-26 cmH20] 26 cmH20   Intake/Output Summary (Last 24 hours) at 10/25/2018 1151 Last data filed at 10/25/2018 1100 Gross per 24 hour  Intake 2745.61 ml  Output 870 ml  Net 1875.61 ml   Filed Weights   10/23/18 0500 10/24/18 0442 10/25/18 0500  Weight: 94.7 kg 94.9 kg 96.4 kg    Examination:  General: Ill-appearing woman, lying in bed, ventilated HENT: ET tube, OG tube in place.  Oropharynx otherwise clear PULM: Coarse bilateral breath sounds, no wheezes CV: Regular, no murmur GI: Soft, nondistended with positive bowel sounds, hypoactive MSK: No deformities Neuro: Very sedated, did not open eyes to stimulation, pain.  She does have a spontaneous drive to breathe   Resolved Hospital Problem list      Assessment & Plan:  Acute respiratory failure with hypoxemia due to COVID 19, fortunately minimal infiltrate, oxygen needs stable Continue PRVC 6/kg, wean FiO2 as able, possibly wean PEEP to 5 soon. Start to lighten sedation so that we can assess her for SBT Follow chest x-ray Received Actemra, currently on steroids Empiric antibiotics for possible left lower lobe CAP Remdisivir day 3 Renal function normal, diuresis 5/23, dosing daily.  Will likely need to add free water on 5/24 VAP prevention order set   Best practice:  Diet: TF Pain/Anxiety/Delirium protocol (if indicated): yes, RASS goal -1 to -2 VAP protocol (if indicated): yes DVT prophylaxis: lovenox bid GI prophylaxis: famotidine Glucose control: SSI Mobility: bed rest Code Status: full Family Communication: by Dr Randol KernElgergawy  5/23 Disposition: ICU  Labs   CBC: Recent Labs  Lab 10/22/18 1611 10/22/18 1744 10/23/18 0652 10/24/18 0419 10/24/18 0920 10/25/18 0705  WBC 16.4*  --  13.5*  --  22.4* 27.7*  NEUTROABS 14.2*  --  11.3*  --  19.9* 24.2*  HGB 13.7 13.6 12.0 12.9 12.5 11.2*  HCT 45.5 40.0 39.8 38.0 40.7 38.0  MCV 91.0  --  90.0  --  91.1 92.7  PLT 256  --  219  --  299 348    Basic Metabolic Panel: Recent Labs  Lab 10/22/18 1612 10/22/18 1744 10/23/18 [id number] 10/23/18 1443 10/24/18 0419 10/24/18 0920 10/24/18 1703 10/25/18 0705  NA  135 137 138  --  144 143  --  146*  K 3.8 3.5 4.6  --  3.2* 3.0*  --  4.3  CL 98  --  105  --   --  110  --  116*  CO2 26  --  21*  --   --  22  --  22  GLUCOSE 156*  --  103*  --   --  140*  --  124*  BUN 23*  --  22*  --   --  29*  --  48*  CREATININE 0.80  --  0.80  --   --  0.78  --  0.81  CALCIUM 8.7*  --  8.4*  --   --  8.4*  --  8.3*  MG  --   --  2.7* 2.4  --  2.4 2.4  --   PHOS  --   --  2.9 2.6  --  2.8 3.1  --    GFR: Estimated Creatinine Clearance: 88.5 mL/min (by C-G formula based on SCr of 0.81 mg/dL). Recent Labs  Lab 10/22/18 1611 10/23/18  0649 10/23/18 0651 10/23/18 0652 10/24/18 0920 10/25/18 0705  PROCALCITON  --  0.37  --   --  0.34 0.32  WBC 16.4*  --   --  13.5* 22.4* 27.7*  LATICACIDVEN  --   --  1.8  --   --   --     Liver Function Tests: Recent Labs  Lab 10/22/18 1612 10/23/18 0652 10/24/18 0920 10/25/18 0705  AST 112* 101* 76* 47*  ALT 58* 51* 48* 39  ALKPHOS 203* 161* 162* 139*  BILITOT 0.5 1.1 0.2* <0.1*  PROT 8.4* 6.8 6.8 6.2*  ALBUMIN 3.2* 2.6* 2.4* 2.2*   Recent Labs  Lab 10/22/18 1612  LIPASE 26   No results for input(s): AMMONIA in the last 168 hours.  ABG    Component Value Date/Time   PHART 7.386 10/24/2018 0419   PCO2ART 39.2 10/24/2018 0419   PO2ART 63.0 (L) 10/24/2018 0419   HCO3 23.6 10/24/2018 0419   TCO2 25 10/24/2018 0419   ACIDBASEDEF 1.0 10/24/2018 0419   O2SAT 92.0 10/24/2018 0419     Coagulation Profile: No results for input(s): INR, PROTIME in the last 168 hours.  Cardiac Enzymes: Recent Labs  Lab 10/22/18 1625 10/23/18 0652  TROPONINI 0.04* 0.03*    HbA1C: No results found for: HGBA1C  CBG: Recent Labs  Lab 10/24/18 1532 10/24/18 1951 10/25/18 0049 10/25/18 0356 10/25/18 0823  GLUCAP 142* 152* 92 108* 113*     Critical care time: 32 minutes    Levy Pupa, MD, PhD 10/25/2018, 11:51 AM [facility] [phone] or if no answer [phone]

## 2018-10-25 NOTE — Progress Notes (Signed)
Peripherally Inserted Central Catheter/Midline Placement  The IV Nurse has discussed with the patient and/or persons authorized to consent for the patient, the purpose of this procedure and the potential benefits and risks involved with this procedure.  The benefits include less needle sticks, lab draws from the catheter, and the patient may be discharged home with the catheter. Risks include, but not limited to, infection, bleeding, blood clot (thrombus formation), and puncture of an artery; nerve damage and irregular heartbeat and possibility to perform a PICC exchange if needed/ordered by physician.  Alternatives to this procedure were also discussed.  Bard Power PICC patient education guide, fact sheet on infection prevention and patient information card has been provided to patient /or left at bedside.    PICC/Midline Placement Documentation  PICC Triple Lumen 10/25/18 PICC Right Cephalic 42 cm 2 cm (Active)   Tell consent signed by Daughter    Shannon Meyer 10/25/2018, 3:27 PM

## 2018-10-25 NOTE — Progress Notes (Signed)
RT note: unable to perform SBT on patient this AM due to FIO2 of 50% and PEEP of 8 with sats of 93%.  Attempted to wean FIO2 to 45%.  Sats currently stable at 91%.  Will continue to monitor.

## 2018-10-26 DIAGNOSIS — E870 Hyperosmolality and hypernatremia: Secondary | ICD-10-CM

## 2018-10-26 LAB — COMPREHENSIVE METABOLIC PANEL
ALT: 43 U/L (ref 0–44)
AST: 49 U/L — ABNORMAL HIGH (ref 15–41)
Albumin: 2.4 g/dL — ABNORMAL LOW (ref 3.5–5.0)
Alkaline Phosphatase: 153 U/L — ABNORMAL HIGH (ref 38–126)
Anion gap: 4 — ABNORMAL LOW (ref 5–15)
BUN: 43 mg/dL — ABNORMAL HIGH (ref 6–20)
CO2: 27 mmol/L (ref 22–32)
Calcium: 8.1 mg/dL — ABNORMAL LOW (ref 8.9–10.3)
Chloride: 116 mmol/L — ABNORMAL HIGH (ref 98–111)
Creatinine, Ser: 0.65 mg/dL (ref 0.44–1.00)
GFR calc Af Amer: >60 mL/min (ref >60)
GFR calc non Af Amer: >60 mL/min (ref >60)
Glucose, Bld: 149 mg/dL — ABNORMAL HIGH (ref 70–99)
Potassium: 3.8 mmol/L (ref 3.5–5.1)
Sodium: 147 mmol/L — ABNORMAL HIGH (ref 135–145)
Total Bilirubin: <0.1 mg/dL — ABNORMAL LOW (ref 0.3–1.2)
Total Protein: 6.2 g/dL — ABNORMAL LOW (ref 6.5–8.1)

## 2018-10-26 LAB — GLUCOSE, CAPILLARY
Glucose-Capillary: 105 mg/dL — ABNORMAL HIGH (ref 70–99)
Glucose-Capillary: 113 mg/dL — ABNORMAL HIGH (ref 70–99)
Glucose-Capillary: 116 mg/dL — ABNORMAL HIGH (ref 70–99)
Glucose-Capillary: 122 mg/dL — ABNORMAL HIGH (ref 70–99)
Glucose-Capillary: 128 mg/dL — ABNORMAL HIGH (ref 70–99)
Glucose-Capillary: 138 mg/dL — ABNORMAL HIGH (ref 70–99)
Glucose-Capillary: 138 mg/dL — ABNORMAL HIGH (ref 70–99)

## 2018-10-26 LAB — TROPONIN I
Troponin I: 0.08 ng/mL (ref <0.03)
Troponin I: 0.05 ng/mL (ref <0.03)

## 2018-10-26 LAB — FERRITIN: Ferritin: 447 ng/mL — ABNORMAL HIGH (ref 11–307)

## 2018-10-26 LAB — CULTURE, BLOOD (ROUTINE X 2)

## 2018-10-26 LAB — C-REACTIVE PROTEIN: CRP: 10.1 mg/dL — ABNORMAL HIGH (ref <1.0)

## 2018-10-26 LAB — D-DIMER, QUANTITATIVE: D-Dimer, Quant: 1.35 ug/mL-FEU — ABNORMAL HIGH (ref 0.00–0.50)

## 2018-10-26 LAB — PROCALCITONIN: Procalcitonin: 0.22 ng/mL

## 2018-10-26 MED ORDER — FUROSEMIDE 10 MG/ML IJ SOLN
40.0000 mg | Freq: Two times a day (BID) | INTRAMUSCULAR | Status: AC
  Administered 2018-10-26 (×2): 40 mg via INTRAVENOUS
  Filled 2018-10-26 (×2): qty 4

## 2018-10-26 MED ORDER — FREE WATER
200.0000 mL | Status: DC
  Administered 2018-10-26 – 2018-10-27 (×5): 200 mL

## 2018-10-26 MED ORDER — POTASSIUM CHLORIDE 20 MEQ/15ML (10%) PO SOLN
40.0000 meq | Freq: Once | ORAL | Status: AC
  Administered 2018-10-26: 40 meq
  Filled 2018-10-26: qty 30

## 2018-10-26 MED ORDER — FREE WATER: 200.0000 mL | Freq: Four times a day (QID) | Status: DC

## 2018-10-26 NOTE — Progress Notes (Signed)
Daughter facetimed with patient. Received updates.

## 2018-10-26 NOTE — Significant Event (Signed)
PCCM interval Note  ST depressions noted on telemetry.   Check ECG and serial troponin now/   Levy Pupa, MD, PhD 10/26/2018, 1:29 PM [facility] [phone] or if no answer [phone]

## 2018-10-26 NOTE — Progress Notes (Signed)
Pt sister called from [place] for updates. Advised sister that I was unable to give her updates due to confidentiality and not being on the pt list of contacts. She states her niece is young and doesn't know the proper questions to be asking and that she had been added to the list of contacts. I advised that she had not been placed on contacts yet and that all I could tell her was that Mrs. Basquez was doing fine and resting well. She continued to ask questions about sedation and if we had any attempts at getting her off the ventilator, etc. I avoided the questions and advised her that she was more than welcome to call back during the day to check on her sister. She was very polite and thanks the staff for caring for her sister.

## 2018-10-26 NOTE — Progress Notes (Addendum)
CRITICAL VALUE ALERT  Critical Value:  Trop 0.08  Date & Time Notied:  5/24/ 20 1644  Provider Notified: Dr. Randol Kern  Orders Received/Actions taken: No new orders 10/26/18 1651

## 2018-10-26 NOTE — Progress Notes (Addendum)
PROGRESS NOTE  Shannon Meyer XJD:[id number] DOB: [birth year] DOA: 10/22/2018  PCP: No primary care provider on file.  Brief History/Interval Summary:  - 53 year old female with unknown past medical history presented with 5-day history of weakness cough shortness of breath.  She was found to be significantly hypoxic when she was evaluated in the emergency department.  She was positive for COVID-19.  She was intubated and then transferred to the hospital for further management.  Reason for Visit: Acute respiratory failure with hypoxia secondary to COVID-19   Subjective/Interval History: Patient remains intubated sedated.  No significant events overnight, not require any pressors overnight.  Assessment/Plan:  Acute Hypoxic Resp. Failure due to Acute Covid 19 Viral Illness  Vent Mode: PRVC FiO2 (%):  [45 %-50 %] 45 % Set Rate:  [24 bmp] 24 bmp Vt Set:  [330 mL-333 mL] 333 mL PEEP:  [8 cmH20] 8 cmH20 Plateau Pressure:  [21 cmH20-26 cmH20] 26 cmH20     Component Value Date/Time   PHART 7.386 10/24/2018 0419   PCO2ART 39.2 10/24/2018 0419   PO2ART 63.0 (L) 10/24/2018 0419   HCO3 23.6 10/24/2018 0419   TCO2 25 10/24/2018 0419   ACIDBASEDEF 1.0 10/24/2018 0419   O2SAT 92.0 10/24/2018 0419    COVID-19 Labs  Recent Labs    10/23/18 0100 10/23/18 0650 10/23/18 0651 10/23/18 0652 10/24/18 0920  DDIMER 2.00*  --   --  1.57* 1.13*  FERRITIN  --  703*  --   --   --   CRP  --   --  26.5*  --  21.9*    Lab Results  Component Value Date   SARSCOV2NAA POSITIVE (A) 10/22/2018     Fever: Afebrile since she has been hospitalized Oxygen requirements: She remains on mechanical ventilation, 40% FiO2, saturating in the 90s, no significant change in vent requirement over last 24 hours . Antibiotics: Ceftriaxone and azithromycin Steroids: Solu-Medrol 40 mg every 8 hours Diuretics: Lasix 79m x 1 on 5/21, Lasix 40 mg x 2 doses 5/24 Actemra: 2 doses have been given, on 5/21. Remdesivir:  Initiated on 5/21 Convalescent Plasma: Not yet Vitamin C and Zinc: Continue  Patient remains on mechanical ventilation, vent management per pulmonary, she remains on ARDS protocol, 6 cc/kg, Foley will be able to start SBT soon .white blood cell count remains elevated, but this is most likely in the setting of steroids, she received Actemra x2, she remains on IV Solu-Medrol, as well she remains on IV from the severe started 10/23/2018, continue to trend inflammatory markers closely, CRP trending down, D-dimers remains mildly elevated but stable, will trend ferritin as well.  - One set of blood cultures growing coag negative staph.  This is most likely contaminant. -Start on Lasix, will receive total of 80 mg of IV Lasix today, monitor BMP closely. -To finish azithromycin yesterday(5 days), and Rocephin total of 7 days  The treatment plan and use of medications and known side effects were discussed with patient/family. It was clearly explained that there is no proven definitive treatment for COVID-19 infection yet. Any medications used here are based on case reports/anecdotal data which are not peer-reviewed and has not been studied using randomized control trials.  Complete risks and long-term side effects are unknown, however in the best clinical judgment they seem to be of some clinical benefit rather than medical risks.  Patient/family agree with the treatment plan and want to receive these treatments as indicated.  Patient was given Actemra and steroids.  COVID-19 Labs  Recent Labs    10/24/18 0920 10/25/18 0705 10/26/18 0300  DDIMER 1.13* 1.00* 1.35*  FERRITIN  --   --  447*  CRP 21.9* 13.8* 10.1*    Lab Results  Component Value Date   SARSCOV2NAA POSITIVE (A) 10/22/2018     Sepsis present on admission Pressure remains soft, he did require brief pressor support overnight, has been turned off currently, 5 no requirement of pressors today, per discussion with PCCM, they will do some  weaning of sedation today, which should improve blood pressure . -Procalcitonin mildly elevated, but stable, continue empiric ceftriaxone and azithromycin.  Follow-up cultures.  Follow-up procalcitonin level.  Blood pressures have improved.  Lactic acid level 1.8.  Leukocytosis Patient noted to have leukocytosis even at admission.  Trending up, but this is most likely in the setting of steroids, procalcitonin is trending down, continue to monitor closely .  Transaminitis Mildly elevated LFTs noted.  Monitor on a daily basis.  Apparently a previous history of alcohol use is mentioned.  LFTs trending down, continue to monitor closely especially she received Actemra.  QT prolongation Noted to be 490 ms.  Avoid QT prolonging agents as much as possible.  Hypernatremia -147 today, start on free water via NGT, especially she is being started on IV Lasix.   DVT Prophylaxis:  Lovenox 40 mg every 12 hours  Lab Results  Component Value Date   PLT 299 10/24/2018     PUD Prophylaxis: Famotidine Code Status: Full code Family Communication: Discussed with daughter yesterday, will call again today Disposition Plan: Will remain in ICU   Consultants: Pulmonology  Procedures: Intubation  Antibiotics: Anti-infectives (From admission, onward)   Start     Dose/Rate Route Frequency Ordered Stop   10/24/18 1400  remdesivir 100 mg in sodium chloride 0.9 % 250 mL IVPB  Status:  Discontinued     100 mg over 30 Minutes Intravenous Every 24 hours 10/23/18 1153 10/24/18 1335   10/24/18 1400  remdesivir 100 mg in sodium chloride 0.9 % 220 mL IVPB  Status:  Discontinued     100 mg over 30 Minutes Intravenous Every 24 hours 10/24/18 1334 10/24/18 1336   10/24/18 1400  remdesivir 100 mg in sodium chloride 0.9 % 230 mL IVPB     100 mg over 30 Minutes Intravenous Every 24 hours 10/24/18 1335 11/02/18 1359   10/24/18 1400  remdesivir 100 mg in sodium chloride 0.9 % 230 mL IVPB  Status:  Discontinued      100 mg over 30 Minutes Intravenous Every 24 hours 10/24/18 1337 10/24/18 1339   10/23/18 1400  remdesivir 200 mg in sodium chloride 0.9 % 250 mL IVPB     200 mg over 30 Minutes Intravenous Once 10/23/18 1153 10/23/18 1738   10/22/18 2200  azithromycin (ZITHROMAX) 500 mg in sodium chloride 0.9 % 250 mL IVPB     500 mg 250 mL/hr over 60 Minutes Intravenous Every 24 hours 10/22/18 2148 10/27/18 2159   10/22/18 2200  cefTRIAXone (ROCEPHIN) 1 g in sodium chloride 0.9 % 100 mL IVPB     1 g 200 mL/hr over 30 Minutes Intravenous Every 24 hours 10/22/18 2148 10/29/18 2159      Medications:  Scheduled: . chlorhexidine gluconate (MEDLINE KIT)  15 mL Mouth Rinse BID  . Chlorhexidine Gluconate Cloth  6 each Topical Daily  . Chlorhexidine Gluconate Cloth  6 each Topical Daily  . enoxaparin (LOVENOX) injection  40 mg Subcutaneous Q12H  . famotidine  20 mg Per  Tube Q12H  . feeding supplement (PRO-STAT SUGAR FREE 64)  60 mL Per Tube Daily  . feeding supplement (VITAL HIGH PROTEIN)  1,000 mL Per Tube Q24H  . free water  200 mL Per Tube Q6H  . furosemide  40 mg Intravenous BID  . mouth rinse  15 mL Mouth Rinse 10 times per day  . methylPREDNISolone (SOLU-MEDROL) injection  40 mg Intravenous Q8H  . multivitamin with minerals  1 tablet Per Tube Daily  . sodium chloride flush  10-40 mL Intracatheter Q12H  . sodium chloride flush  3 mL Intravenous Q12H  . vitamin C  500 mg Per Tube Daily  . zinc sulfate  220 mg Per Tube Daily   Continuous: . sodium chloride 10 mL/hr at 10/26/18 1000  . azithromycin 500 mg (10/25/18 2209)  . cefTRIAXone (ROCEPHIN)  IV 200 mL/hr at 10/25/18 2142  . fentaNYL infusion INTRAVENOUS 75 mcg/hr (10/26/18 1000)  . norepinephrine (LEVOPHED) Adult infusion Stopped (10/24/18 2045)  . propofol (DIPRIVAN) infusion 20 mcg/kg/min (10/26/18 1006)  . remdesivir 100 mg in NS 250 mL     UYQ:IHKVQQVZDGLOV (TYLENOL) oral liquid 160 mg/5 mL, bisacodyl, docusate, fentaNYL, fentaNYL  (SUBLIMAZE) injection, midazolam, sodium chloride flush   Objective:  Vital Signs  Vitals:   10/26/18 1000 10/26/18 1030 10/26/18 1100 10/26/18 1130  BP: (!) 154/91 133/79 107/65 130/87  Pulse: (!) 119     Resp: 20     Temp:      TempSrc:      SpO2: 93%     Weight:      Height:        Intake/Output Summary (Last 24 hours) at 10/26/2018 1140 Last data filed at 10/26/2018 1000 Gross per 24 hour  Intake 1220.15 ml  Output 2050 ml  Net -829.85 ml   Filed Weights   10/24/18 0442 10/25/18 0500 10/26/18 0400  Weight: 94.9 kg 96.4 kg 96.4 kg   Sedated, intubated, in no apparent distress  ET tube, OG tube in place  Coarse respiratory sounds bilaterally, no wheezing, few scattered crackles  Regular rate and rhythm, no rubs or gallops  Abdomen soft, nondistended, positive bowel sounds  Extremities with no edema, clubbing or cyanosis  N open eyes to voice, follow commands on moderate sedation .      Lab Results:  Data Reviewed: I have personally reviewed following labs and imaging studies  CBC: Recent Labs  Lab 10/22/18 1611 10/22/18 1744 10/23/18 0652 10/24/18 0419 10/24/18 0920 10/25/18 0705  WBC 16.4*  --  13.5*  --  22.4* 27.7*  NEUTROABS 14.2*  --  11.3*  --  19.9* 24.2*  HGB 13.7 13.6 12.0 12.9 12.5 11.2*  HCT 45.5 40.0 39.8 38.0 40.7 38.0  MCV 91.0  --  90.0  --  91.1 92.7  PLT 256  --  219  --  299 564    Basic Metabolic Panel: Recent Labs  Lab 10/22/18 1612  10/23/18 0652 10/23/18 1443 10/24/18 0419 10/24/18 0920 10/24/18 1703 10/25/18 0705 10/26/18 0300  NA 135   < > 138  --  144 143  --  146* 147*  K 3.8   < > 4.6  --  3.2* 3.0*  --  4.3 3.8  CL 98  --  105  --   --  110  --  116* 116*  CO2 26  --  21*  --   --  22  --  22 27  GLUCOSE 156*  --  103*  --   --  140*  --  124* 149*  BUN 23*  --  22*  --   --  29*  --  48* 43*  CREATININE 0.80  --  0.80  --   --  0.78  --  0.81 0.65  CALCIUM 8.7*  --  8.4*  --   --  8.4*  --  8.3* 8.1*  MG  --    --  2.7* 2.4  --  2.4 2.4  --   --   PHOS  --   --  2.9 2.6  --  2.8 3.1  --   --    < > = values in this interval not displayed.    GFR: Estimated Creatinine Clearance: 89.6 mL/min (by C-G formula based on SCr of 0.65 mg/dL).  Liver Function Tests: Recent Labs  Lab 10/22/18 1612 10/23/18 0652 10/24/18 0920 10/25/18 0705 10/26/18 0300  AST 112* 101* 76* 47* 49*  ALT 58* 51* 48* 39 43  ALKPHOS 203* 161* 162* 139* 153*  BILITOT 0.5 1.1 0.2* <0.1* <0.1*  PROT 8.4* 6.8 6.8 6.2* 6.2*  ALBUMIN 3.2* 2.6* 2.4* 2.2* 2.4*    Recent Labs  Lab 10/22/18 1612  LIPASE 26    Cardiac Enzymes: Recent Labs  Lab 10/22/18 1625 10/23/18 0652  TROPONINI 0.04* 0.03*     Lipid Profile: Recent Labs    10/24/18 0920 10/25/18 0705  TRIG 317* 139    Anemia Panel: Recent Labs    10/26/18 0300  FERRITIN 447*    Recent Results (from the past 240 hour(s))  SARS Coronavirus 2 (Hosp order,Performed in [facility] lab via [employer] ID)     Status: Abnormal   Collection Time: 10/22/18  4:16 PM  Result Value Ref Range Status   SARS Coronavirus 2 ([employer] Now) POSITIVE (A) NEGATIVE Final    Comment: RESULT CALLED TO, READ BACK BY AND VERIFIED WITH: CALLED TO Berthold RN AT 1650 BY SROY ON [id number] (NOTE) Interpretive Result Comment(s): COVID 19 Positive SARS CoV 2 target nucleic acids are DETECTED. The SARS CoV 2 RNA is generally detectable in upper and lower respiratory specimens during the acute phase of infection.  Positive results are indicative of active infection with SARS CoV 2.  Clinical correlation with patient history and other diagnostic information is necessary to determine patient infection status.  Positive results do not rule out bacterial infection or coinfection with other viruses. The expected result is Negative. COVID 19 Negative SARS CoV 2 target nucleic acids are NOT DETECTED. The SARS CoV 2 RNA is generally detectable in upper and lower respiratory specimens  during the acute phase of infection.  Negative results do not preclude SARS CoV 2 infection, do not rule out coinfections with other pathogens, and should not be used as the sole basis for  treatment or other patient management decisions.  Negative results must be combined with clinical observations, patient history, and epidemiological information. The expected result is Negative. Invalid Presence or absence of SARS CoV 2 nucleic acids cannot be determined. Repeat testing was performed on the submitted specimen and repeated Invalid results were obtained.  If clinically indicated, additional testing on a new specimen with an alternate test methodology [phone]) is advised.  The SARS CoV 2 RNA is generally detectable in upper and lower respiratory specimens during the acute phase of infection. The expected result is Negative. Fact Sheet for Patients:  GolfingFamily.no Fact Sheet for Healthcare Providers: https://www.hernandez-brewer.com/ This test is not yet approved or cleared by the [place]  States FDA and has been authorized for detection and/or diagnosis of SARS CoV 2 by FDA under an Emergency Use Authorization (EUA).   This EUA will remain in effect (meaning this test can be used) for the duration of the COVID19 declaration under Section 564(b)(1) of the Act, 21 U.S.C. section [phone] 3(b)(1), unless the authorization is terminated or revoked sooner. Performed at [facility], [address] [place], [place] [address]   Culture, blood (routine x 2)     Status: None (Preliminary result)   Collection Time: 10/22/18  9:49 PM  Result Value Ref Range Status   Specimen Description BLOOD LEFT HAND  Final   Special Requests   Final    BOTTLES DRAWN AEROBIC ONLY Blood Culture adequate volume   Culture   Final    NO GROWTH 3 DAYS Performed at [facility], [place] [address] [place], [place] [address]    Report Status PENDING   Incomplete  Culture, blood (routine x 2)     Status: Abnormal   Collection Time: 10/22/18  9:54 PM  Result Value Ref Range Status   Specimen Description   Final    BLOOD Performed at [place] [address] [place], [place] [address]    Special Requests   Final    NONE Performed at [facility], [place] [address] [place], [place] [address]    Culture  Setup Time   Final    GRAM POSITIVE COCCI IN CLUSTERS AEROBIC BOTTLE ONLY CRITICAL RESULT CALLED TO, READ BACK BY AND VERIFIED WITH: N. BATCHELDER,PHARMD 0543 10/24/2018 T. TYSOR    Culture (A)  Final    STAPHYLOCOCCUS SPECIES (COAGULASE NEGATIVE) THE SIGNIFICANCE OF ISOLATING THIS ORGANISM FROM A SINGLE SET OF BLOOD CULTURES WHEN MULTIPLE SETS ARE DRAWN IS UNCERTAIN. PLEASE NOTIFY THE MICROBIOLOGY DEPARTMENT WITHIN ONE WEEK IF SPECIATION AND SENSITIVITIES ARE REQUIRED. Performed at [facility], [place] [address], [place], [place] [address]    Report Status 10/26/2018 FINAL  Final  Blood Culture ID Panel (Reflexed)     Status: Abnormal   Collection Time: 10/22/18  9:54 PM  Result Value Ref Range Status   Enterococcus species NOT DETECTED NOT DETECTED Final   Listeria monocytogenes NOT DETECTED NOT DETECTED Final   Staphylococcus species DETECTED (A) NOT DETECTED Final    Comment: Methicillin (oxacillin) susceptible coagulase negative staphylococcus. Possible blood culture contaminant (unless isolated from more than one blood culture draw or clinical case suggests pathogenicity). No antibiotic treatment is indicated for blood  culture contaminants. CRITICAL RESULT CALLED TO, READ BACK BY AND VERIFIED WITH: N. BATCHELDER,PHARMD 0543 10/24/2018 T. TYSOR    Staphylococcus aureus (BCID) NOT DETECTED NOT DETECTED Final   Methicillin resistance NOT DETECTED NOT DETECTED Final   Streptococcus species NOT DETECTED NOT DETECTED Final   Streptococcus agalactiae NOT DETECTED NOT DETECTED Final    Streptococcus pneumoniae NOT DETECTED NOT DETECTED Final   Streptococcus pyogenes NOT DETECTED NOT DETECTED Final   Acinetobacter baumannii NOT DETECTED NOT DETECTED Final   Enterobacteriaceae species NOT DETECTED NOT DETECTED Final   Enterobacter cloacae complex NOT DETECTED NOT DETECTED Final   Escherichia coli NOT DETECTED NOT DETECTED Final   Klebsiella oxytoca NOT DETECTED NOT DETECTED Final   Klebsiella pneumoniae NOT DETECTED NOT DETECTED Final   Proteus species NOT DETECTED NOT DETECTED Final   Serratia marcescens NOT DETECTED NOT DETECTED Final   Haemophilus influenzae NOT DETECTED NOT DETECTED Final   Neisseria meningitidis NOT  DETECTED NOT DETECTED Final   Pseudomonas aeruginosa NOT DETECTED NOT DETECTED Final   Candida albicans NOT DETECTED NOT DETECTED Final   Candida glabrata NOT DETECTED NOT DETECTED Final   Candida krusei NOT DETECTED NOT DETECTED Final   Candida parapsilosis NOT DETECTED NOT DETECTED Final   Candida tropicalis NOT DETECTED NOT DETECTED Final    Comment: Performed at [facility], [place] [address] [place], [place] [address]  MRSA PCR Screening     Status: None   Collection Time: 10/25/18  2:25 AM  Result Value Ref Range Status   MRSA by PCR NEGATIVE NEGATIVE Final    Comment:        The GeneXpert MRSA Assay (FDA approved for NASAL specimens only), is one component of a comprehensive MRSA colonization surveillance program. It is not intended to diagnose MRSA infection nor to guide or monitor treatment for MRSA infections. Performed at [facility], [place] [address], [place], [place] [address]       Radiology Studies: Dg Chest Port 1 View  Result Date: 10/25/2018 CLINICAL DATA:  PICC line placement.  COVID-19 infection. EXAM: PORTABLE CHEST 1 VIEW COMPARISON:  Radiographs 10/25/2018 and 10/24/2018. FINDINGS: 1547 hours. The endotracheal and nasogastric tubes appear unchanged. There is a new right arm PICC, the tip of which  is not well visualized. This appears to extend beyond the superior cavoatrial junction into the right atrium. Left-greater-than-right bibasilar airspace opacities and a possible small left pleural effusion are stable. There is no pneumothorax. The bones appear unchanged. IMPRESSION: 1. Tip of the new PICC line is not well visualized, but likely extends into the right atrium. This could be pulled back 2-3 cm for more optimal positioning. 2. The additional components of the support system are stable. 3. Stable findings of multilobar pneumonia. Electronically Signed   By: Richardean Sale M.D.   On: 10/25/2018 16:27   Dg Chest Port 1 View  Result Date: 10/25/2018 CLINICAL DATA:  53 year old female with history of acute respiratory failure. EXAM: PORTABLE CHEST 1 VIEW COMPARISON:  Chest x-ray 10/24/2018. FINDINGS: An endotracheal tube is in place with tip 2.2 cm above the carina. Nasogastric tube extending into the stomach. Lung volumes are low. Patchy multifocal ill-defined airspace disease throughout the lungs bilaterally. Air bronchograms in the left lower lobe. Probable small left pleural effusion. No definite evidence of pulmonary edema. Heart size is normal. The patient is rotated to the left on today's exam, resulting in distortion of the mediastinal contours and reduced diagnostic sensitivity and specificity for mediastinal pathology. IMPRESSION: 1. Support apparatus, as above. 2. Severe multilobar pneumonia redemonstrated. Electronically Signed   By: Vinnie Langton M.D.   On: 10/25/2018 05:46   [place] Ekg Site Rite  Result Date: 10/25/2018 If Site Rite image not attached, placement could not be confirmed due to current cardiac rhythm.      LOS: 4 days   Phillips Climes MD  [facility] Pager on [email or web address]  10/26/2018, 11:40 AM

## 2018-10-26 NOTE — Progress Notes (Signed)
NAME:  Shannon Meyer, MRN:  [id number], DOB:  09-24-1965, LOS: 4 ADMISSION DATE:  10/22/2018, CONSULTATION DATE: Oct 22, 2018 REFERRING MD: Dr. Pilar Plate, ER physician, CHIEF COMPLAINT: Dyspnea  Brief History   53 year old female presented to the [facility] emergency room with fever cough and shortness of breath.  Required intubation for severe hypoxemic respiratory failure.  Noted to be COVID positive.   Past Medical History  No known past medical history  Significant Hospital Events   May 20 admission to [facility]  Consults:  Pulmonary critical care  Procedures:  May 20 ET tube  Significant Diagnostic Tests:    Micro Data:  SARS-CoV-2 May 20> positive Blood culture May 20 > 1 of 2 GPC (staph)>>   Antimicrobials:  Zithromax 5/20 >>  5/24 Rocephin 5/20 >>   Interim history/subjective:  Remains on PEEP 8, fiO2 0.40 I > O, + 3.5L total Adequate sedation No CXR today    Objective   Blood pressure (!) 154/91, pulse (!) 119, temperature 98 F (36.7 C), temperature source Axillary, resp. rate 20, height 5\' 4"  (1.626 m), weight 96.4 kg, SpO2 93 %.    Vent Mode: PRVC FiO2 (%):  [40 %-93 %] 93 % Set Rate:  [24 bmp] 24 bmp Vt Set:  [330 mL] 330 mL PEEP:  [8 cmH20] 8 cmH20 Plateau Pressure:  [22 cmH20-26 cmH20] 25 cmH20   Intake/Output Summary (Last 24 hours) at 10/26/2018 1122 Last data filed at 10/26/2018 1000 Gross per 24 hour  Intake 1220.15 ml  Output 2050 ml  Net -829.85 ml   Filed Weights   10/24/18 0442 10/25/18 0500 10/26/18 0400  Weight: 94.9 kg 96.4 kg 96.4 kg    Examination:  General: Ill-appearing woman, comfortable, ventilated HENT: ET tube, OG tube in place, oropharynx otherwise clear PULM: Coarse bilateral breath sounds, no wheezing, few scattered crackles CV: Regular, no murmur GI: Soft, nondistended with positive bowel sounds MSK: No deformity Neuro: Opens eyes to voice, nodded to questions, follows commands on  moderate sedation   Resolved Hospital Problem list     Assessment & Plan:  Acute respiratory failure with hypoxemia due to COVID 19, fortunately minimal infiltrate, oxygen needs stable Continue PRVC 6 cc/kg, hopefully transition to SBT soon.  Likely attempt to decrease PEEP to five 5/24 Sedation has been lightened and she is tolerating well. Chest x-ray 5/25 Received Actemra, currently on steroids.  Consider weaning soon Empiric antibiotics for left lower lobe possible CAP.  Plan to stop azithromycin today, complete a 7-day course of ceftriaxone Remdisivir day 4 Renal function normal, sodium 147.  Add free water and continue diuresis as she can tolerate VAP prevention order set   Best practice:  Diet: TF Pain/Anxiety/Delirium protocol (if indicated): yes, RASS goal -1 to -2 VAP protocol (if indicated): yes DVT prophylaxis: lovenox bid GI prophylaxis: famotidine Glucose control: SSI Mobility: bed rest Code Status: full Family Communication: by Dr Randol Kern 5/24 Disposition: ICU  Labs   CBC: Recent Labs  Lab 10/22/18 1611 10/22/18 1744 10/23/18 7847 10/24/18 0419 10/24/18 0920 10/25/18 0705  WBC 16.4*  --  13.5*  --  22.4* 27.7*  NEUTROABS 14.2*  --  11.3*  --  19.9* 24.2*  HGB 13.7 13.6 12.0 12.9 12.5 11.2*  HCT 45.5 40.0 39.8 38.0 40.7 38.0  MCV 91.0  --  90.0  --  91.1 92.7  PLT 256  --  219  --  299 348    Basic Metabolic Panel:  Recent Labs  Lab 10/22/18 1612  10/23/18 [id number] 10/23/18 1443 10/24/18 0419 10/24/18 0920 10/24/18 1703 10/25/18 0705 10/26/18 0300  NA 135   < > 138  --  144 143  --  146* 147*  K 3.8   < > 4.6  --  3.2* 3.0*  --  4.3 3.8  CL 98  --  105  --   --  110  --  116* 116*  CO2 26  --  21*  --   --  22  --  22 27  GLUCOSE 156*  --  103*  --   --  140*  --  124* 149*  BUN 23*  --  22*  --   --  29*  --  48* 43*  CREATININE 0.80  --  0.80  --   --  0.78  --  0.81 0.65  CALCIUM 8.7*  --  8.4*  --   --  8.4*  --  8.3* 8.1*  MG  --   --   2.7* 2.4  --  2.4 2.4  --   --   PHOS  --   --  2.9 2.6  --  2.8 3.1  --   --    < > = values in this interval not displayed.   GFR: Estimated Creatinine Clearance: 89.6 mL/min (by C-G formula based on SCr of 0.65 mg/dL). Recent Labs  Lab 10/22/18 1611 10/23/18 0649 10/23/18 0651 10/23/18 0652 10/24/18 0920 10/25/18 0705 10/26/18 0300  PROCALCITON  --  0.37  --   --  0.34 0.32 0.22  WBC 16.4*  --   --  13.5* 22.4* 27.7*  --   LATICACIDVEN  --   --  1.8  --   --   --   --     Liver Function Tests: Recent Labs  Lab 10/22/18 1612 10/23/18 0652 10/24/18 0920 10/25/18 0705 10/26/18 0300  AST 112* 101* 76* 47* 49*  ALT 58* 51* 48* 39 43  ALKPHOS 203* 161* 162* 139* 153*  BILITOT 0.5 1.1 0.2* <0.1* <0.1*  PROT 8.4* 6.8 6.8 6.2* 6.2*  ALBUMIN 3.2* 2.6* 2.4* 2.2* 2.4*   Recent Labs  Lab 10/22/18 1612  LIPASE 26   No results for input(s): AMMONIA in the last 168 hours.  ABG    Component Value Date/Time   PHART 7.386 10/24/2018 0419   PCO2ART 39.2 10/24/2018 0419   PO2ART 63.0 (L) 10/24/2018 0419   HCO3 23.6 10/24/2018 0419   TCO2 25 10/24/2018 0419   ACIDBASEDEF 1.0 10/24/2018 0419   O2SAT 92.0 10/24/2018 0419     Coagulation Profile: No results for input(s): INR, PROTIME in the last 168 hours.  Cardiac Enzymes: Recent Labs  Lab 10/22/18 1625 10/23/18 0652  TROPONINI 0.04* 0.03*    HbA1C: No results found for: HGBA1C  CBG: Recent Labs  Lab 10/25/18 1638 10/25/18 2007 10/26/18 0029 10/26/18 0443 10/26/18 0819  GLUCAP 120* 104* 138* 113* 105*     Critical care time: 33 minutes    Levy Pupaobert Jeanmarie Mccowen, MD, PhD 10/26/2018, 11:22 AM [facility] [phone] or if no answer [phone]

## 2018-10-26 NOTE — Progress Notes (Signed)
Spoke with patient's daughter. She inquired about improvements with her mom and went on discuss that her brother had since tested positive and she herself was awaiting results. She asked to thank everyone for all their work and the care they were providing for her mother.

## 2018-10-27 ENCOUNTER — Inpatient Hospital Stay (HOSPITAL_COMMUNITY): Payer: HRSA Program

## 2018-10-27 LAB — COMPREHENSIVE METABOLIC PANEL
ALT: 53 U/L — ABNORMAL HIGH (ref 0–44)
AST: 62 U/L — ABNORMAL HIGH (ref 15–41)
Albumin: 2.7 g/dL — ABNORMAL LOW (ref 3.5–5.0)
Alkaline Phosphatase: 169 U/L — ABNORMAL HIGH (ref 38–126)
Anion gap: 11 (ref 5–15)
BUN: 40 mg/dL — ABNORMAL HIGH (ref 6–20)
CO2: 28 mmol/L (ref 22–32)
Calcium: 8.5 mg/dL — ABNORMAL LOW (ref 8.9–10.3)
Chloride: 107 mmol/L (ref 98–111)
Creatinine, Ser: 0.75 mg/dL (ref 0.44–1.00)
GFR calc Af Amer: >60 mL/min (ref >60)
GFR calc non Af Amer: >60 mL/min (ref >60)
Glucose, Bld: 140 mg/dL — ABNORMAL HIGH (ref 70–99)
Potassium: 4.1 mmol/L (ref 3.5–5.1)
Sodium: 146 mmol/L — ABNORMAL HIGH (ref 135–145)
Total Bilirubin: <0.1 mg/dL — ABNORMAL LOW (ref 0.3–1.2)
Total Protein: 6.9 g/dL (ref 6.5–8.1)

## 2018-10-27 LAB — CBC
HCT: 40.2 % (ref 36.0–46.0)
Hemoglobin: 12.2 g/dL (ref 12.0–15.0)
MCH: 27.9 pg (ref 26.0–34.0)
MCHC: 30.3 g/dL (ref 30.0–36.0)
MCV: 92 fL (ref 80.0–100.0)
Platelets: 443 10*3/uL — ABNORMAL HIGH (ref 150–400)
RBC: 4.37 MIL/uL (ref 3.87–5.11)
RDW: 16.1 % — ABNORMAL HIGH (ref 11.5–15.5)
WBC: 31.3 10*3/uL — ABNORMAL HIGH (ref 4.0–10.5)
nRBC: 0.2 % (ref 0.0–0.2)

## 2018-10-27 LAB — D-DIMER, QUANTITATIVE: D-Dimer, Quant: 1.51 ug/mL-FEU — ABNORMAL HIGH (ref 0.00–0.50)

## 2018-10-27 LAB — FERRITIN: Ferritin: 401 ng/mL — ABNORMAL HIGH (ref 11–307)

## 2018-10-27 LAB — C-REACTIVE PROTEIN: CRP: 7.8 mg/dL — ABNORMAL HIGH (ref <1.0)

## 2018-10-27 LAB — PROCALCITONIN: Procalcitonin: 0.25 ng/mL

## 2018-10-27 LAB — TROPONIN I: Troponin I: 0.04 ng/mL (ref <0.03)

## 2018-10-27 LAB — GLUCOSE, CAPILLARY
Glucose-Capillary: 113 mg/dL — ABNORMAL HIGH (ref 70–99)
Glucose-Capillary: 109 mg/dL — ABNORMAL HIGH (ref 70–99)

## 2018-10-27 MED ORDER — FUROSEMIDE 10 MG/ML IJ SOLN
40.0000 mg | Freq: Two times a day (BID) | INTRAMUSCULAR | Status: AC
  Administered 2018-10-27 (×2): 40 mg via INTRAVENOUS
  Filled 2018-10-27 (×2): qty 4

## 2018-10-27 MED ORDER — HYDRALAZINE HCL 20 MG/ML IJ SOLN
5.0000 mg | INTRAMUSCULAR | Status: DC | PRN
  Administered 2018-11-23 – 2018-12-04 (×5): 5 mg via INTRAVENOUS
  Filled 2018-10-27 (×6): qty 1

## 2018-10-27 MED ORDER — METHYLPREDNISOLONE SODIUM SUCC 40 MG IJ SOLR
40.0000 mg | Freq: Every day | INTRAMUSCULAR | Status: DC
  Administered 2018-10-28: 40 mg via INTRAVENOUS
  Filled 2018-10-27 (×2): qty 1

## 2018-10-27 MED ORDER — POTASSIUM CHLORIDE 20 MEQ/15ML (10%) PO SOLN
40.0000 meq | Freq: Once | ORAL | Status: AC
  Administered 2018-10-27: 12:00:00 40 meq
  Filled 2018-10-27: qty 30

## 2018-10-27 MED ORDER — FREE WATER
300.0000 mL | Status: DC
  Administered 2018-10-27 – 2018-10-31 (×23): 300 mL

## 2018-10-27 MED ORDER — SODIUM CHLORIDE 0.9 % IV SOLN
2.0000 g | INTRAVENOUS | Status: AC
  Administered 2018-10-27 – 2018-10-28 (×2): 2 g via INTRAVENOUS
  Filled 2018-10-27 (×2): qty 20

## 2018-10-27 MED ORDER — CHLORHEXIDINE GLUCONATE 0.12 % MT SOLN
15.0000 mL | Freq: Two times a day (BID) | OROMUCOSAL | Status: DC
  Administered 2018-10-27 – 2018-11-11 (×32): 15 mL via OROMUCOSAL
  Filled 2018-10-27 (×26): qty 15

## 2018-10-27 NOTE — Progress Notes (Addendum)
PROGRESS NOTE  Shannon Meyer WUJ:[id number] DOB: 1965/09/22 DOA: 10/22/2018  PCP: No primary care provider on file.  Brief History/Interval Summary:   - 53 year old female with unknown past medical history presented with 5-day history of weakness cough shortness of breath.  She was found to be significantly hypoxic when she was evaluated in the emergency department.  She was positive for COVID-19.  She was intubated and then transferred to the hospital for further management.  Reason for Visit:  Acute respiratory failure with hypoxia secondary to COVID-19   Subjective/Interval History: Patient remains intubated sedated.  No significant events overnight.,  Pressure support earlier today 20/5.  Assessment/Plan:  Acute Hypoxic Resp. Failure due to Acute Covid 19 Viral Illness     Component Value Date/Time   PHART 7.386 10/24/2018 0419   PCO2ART 39.2 10/24/2018 0419   PO2ART 63.0 (L) 10/24/2018 0419   HCO3 23.6 10/24/2018 0419   TCO2 25 10/24/2018 0419   ACIDBASEDEF 1.0 10/24/2018 0419   O2SAT 92.0 10/24/2018 0419    COVID-19 Labs  Recent Labs    10/23/18 0100 10/23/18 0650 10/23/18 0651 10/23/18 0652 10/24/18 0920  DDIMER 2.00*  --   --  1.57* 1.13*  FERRITIN  --  703*  --   --   --   CRP  --   --  26.5*  --  21.9*    Lab Results  Component Value Date   SARSCOV2NAA POSITIVE (A) 10/22/2018    Patient remains on mechanical ventilation, management per pulmonary,, she is on ARDS protocol 6 cc/Kg, SBT per PCCm to start today. -on Remdesivir : initiated 5/21 -IV Solu-Medrol, started weaning today -Received Actemra x2 on 5/21, continue to monitor LFTs -New with vitamin C and zinc -On IV diuresis, dosed as needed, Lasix 40 mg IV BID X2 doses today. -Continue to follow inflammatory markers closely, ending down. -To finish azithromycin yesterday(5 days), and Rocephin total of 7 days -On DVT prophylaxis per COVID 19 ICU protocol - on sedation , D/C staff will minimize for  Target A SS score of -1 -OG tube was repositioned given it was looping in distal esophagus, will repeat imaging  The treatment plan and use of medications and known side effects were discussed with patient/family. It was clearly explained that there is no proven definitive treatment for COVID-19 infection yet. Any medications used here are based on case reports/anecdotal data which are not peer-reviewed and has not been studied using randomized control trials.  Complete risks and long-term side effects are unknown, however in the best clinical judgment they seem to be of some clinical benefit rather than medical risks.  Patient/family agree with the treatment plan and want to receive these treatments as indicated.  Patient was given Actemra and steroids.  COVID-19 Labs  Recent Labs    10/25/18 0705 10/26/18 0300 10/27/18 0115  DDIMER 1.00* 1.35* 1.51*  FERRITIN  --  447* 401*  CRP 13.8* 10.1* 7.8*    Lab Results  Component Value Date   SARSCOV2NAA POSITIVE (A) 10/22/2018     Sepsis present on admission -Pressure has been soft initially, she did require brief pressors support, pressure has been stable since -Procalcitonin mildly elevated, but stable, treated empiric ceftriaxone and azithromycin.  Leukocytosis Patient noted to have leukocytosis even at admission.  Trending up, but this is most likely in the setting of steroids, procalcitonin is trending down, continue to monitor closely .  Transaminitis - Mildly elevated LFTs noted.  Trending up slightly, she did receive Actemra .  QT prolongation Noted to be 490 ms.  Avoid QT prolonging agents as much as possible.  Hypernatremia - improving, it is 146 today, monitor closely as on IV diuresis, increase her free water via OGT .  DVT Prophylaxis:  Lovenox 40 mg every 12 hours  Lab Results  Component Value Date   PLT 299 10/24/2018     PUD Prophylaxis: Famotidine Code Status: Full code Family Communication: Discussed with  daughter yesterday, will call again today Disposition Plan: Will remain in ICU   Consultants: Pulmonology  Procedures: Intubation  Antibiotics: Anti-infectives (From admission, onward)   Start     Dose/Rate Route Frequency Ordered Stop   10/27/18 2200  cefTRIAXone (ROCEPHIN) 2 g in sodium chloride 0.9 % 100 mL IVPB     2 g 200 mL/hr over 30 Minutes Intravenous Every 24 hours 10/27/18 0835 10/29/18 2159   10/24/18 1400  remdesivir 100 mg in sodium chloride 0.9 % 250 mL IVPB  Status:  Discontinued     100 mg over 30 Minutes Intravenous Every 24 hours 10/23/18 1153 10/24/18 1335   10/24/18 1400  remdesivir 100 mg in sodium chloride 0.9 % 220 mL IVPB  Status:  Discontinued     100 mg over 30 Minutes Intravenous Every 24 hours 10/24/18 1334 10/24/18 1336   10/24/18 1400  remdesivir 100 mg in sodium chloride 0.9 % 230 mL IVPB     100 mg over 30 Minutes Intravenous Every 24 hours 10/24/18 1335 11/02/18 1359   10/24/18 1400  remdesivir 100 mg in sodium chloride 0.9 % 230 mL IVPB  Status:  Discontinued     100 mg over 30 Minutes Intravenous Every 24 hours 10/24/18 1337 10/24/18 1339   10/23/18 1400  remdesivir 200 mg in sodium chloride 0.9 % 250 mL IVPB     200 mg over 30 Minutes Intravenous Once 10/23/18 1153 10/23/18 1738   10/22/18 2200  azithromycin (ZITHROMAX) 500 mg in sodium chloride 0.9 % 250 mL IVPB     500 mg 250 mL/hr over 60 Minutes Intravenous Every 24 hours 10/22/18 2148 10/26/18 2255   10/22/18 2200  cefTRIAXone (ROCEPHIN) 1 g in sodium chloride 0.9 % 100 mL IVPB  Status:  Discontinued     1 g 200 mL/hr over 30 Minutes Intravenous Every 24 hours 10/22/18 2148 10/27/18 0835      Medications:  Scheduled: . chlorhexidine  15 mL Mouth/Throat BID  . Chlorhexidine Gluconate Cloth  6 each Topical Daily  . Chlorhexidine Gluconate Cloth  6 each Topical Daily  . enoxaparin (LOVENOX) injection  40 mg Subcutaneous Q12H  . famotidine  20 mg Per Tube Q12H  . feeding supplement  (PRO-STAT SUGAR FREE 64)  60 mL Per Tube Daily  . feeding supplement (VITAL HIGH PROTEIN)  1,000 mL Per Tube Q24H  . free water  200 mL Per Tube Q4H  . mouth rinse  15 mL Mouth Rinse 10 times per day  . [START ON 10/28/2018] methylPREDNISolone (SOLU-MEDROL) injection  40 mg Intravenous Daily  . multivitamin with minerals  1 tablet Per Tube Daily  . sodium chloride flush  10-40 mL Intracatheter Q12H  . sodium chloride flush  3 mL Intravenous Q12H  . vitamin C  500 mg Per Tube Daily  . zinc sulfate  220 mg Per Tube Daily   Continuous: . sodium chloride Stopped (10/26/18 1110)  . cefTRIAXone (ROCEPHIN)  IV    . fentaNYL infusion INTRAVENOUS 75 mcg/hr (10/27/18 0600)  . norepinephrine (LEVOPHED) Adult infusion  Stopped (10/24/18 2045)  . propofol (DIPRIVAN) infusion 15 mcg/kg/min (10/27/18 0600)  . remdesivir 100 mg in NS 250 mL     WUJ:WJXBJYNWGNFAO (TYLENOL) oral liquid 160 mg/5 mL, bisacodyl, docusate, fentaNYL, fentaNYL (SUBLIMAZE) injection, hydrALAZINE, midazolam, sodium chloride flush   Objective:  Vital Signs  Vitals:   10/27/18 0800 10/27/18 0845 10/27/18 0900 10/27/18 1000  BP: 128/84  (!) 142/95 (!) 151/96  Pulse: 97 98 (!) 106 (!) 113  Resp: 16 19 (!) 23 (!) 22  Temp:      TempSrc:      SpO2: 94% 93% 91% (!) 89%  Weight:      Height:        Intake/Output Summary (Last 24 hours) at 10/27/2018 1125 Last data filed at 10/27/2018 0600 Gross per 24 hour  Intake 1349.85 ml  Output 3775 ml  Net -2425.15 ml   Filed Weights   10/25/18 0500 10/26/18 0400 10/27/18 0435  Weight: 96.4 kg 96.4 kg 92 kg   Sedated, intubated, in no apparent distress ET tube, OG tube in place  Supported breathing, clear to auscultation bilaterally, no wheezing  Good rate and rhythm, no rubs murmurs gallops  Abd soft, nontender, bowel sounds present  Extremities with no edema, clubbing or cyanosis     Lab Results:  Data Reviewed: I have personally reviewed following labs and imaging  studies  CBC: Recent Labs  Lab 10/22/18 1611  10/23/18 0652 10/24/18 0419 10/24/18 0920 10/25/18 0705 10/27/18 0115  WBC 16.4*  --  13.5*  --  22.4* 27.7* 31.3*  NEUTROABS 14.2*  --  11.3*  --  19.9* 24.2*  --   HGB 13.7   < > 12.0 12.9 12.5 11.2* 12.2  HCT 45.5   < > 39.8 38.0 40.7 38.0 40.2  MCV 91.0  --  90.0  --  91.1 92.7 92.0  PLT 256  --  219  --  299 348 443*   < > = values in this interval not displayed.    Basic Metabolic Panel: Recent Labs  Lab 10/23/18 0652 10/23/18 1443 10/24/18 0419 10/24/18 0920 10/24/18 1703 10/25/18 0705 10/26/18 0300 10/27/18 0115  NA 138  --  144 143  --  146* 147* 146*  K 4.6  --  3.2* 3.0*  --  4.3 3.8 4.1  CL 105  --   --  110  --  116* 116* 107  CO2 21*  --   --  22  --  GLUCOSE 103*  --   --  140*  --  124* 149* 140*  BUN 22*  --   --  29*  --  48* 43* 40*  CREATININE 0.80  --   --  0.78  --  0.81 0.65 0.75  CALCIUM 8.4*  --   --  8.4*  --  8.3* 8.1* 8.5*  MG 2.7* 2.4  --  2.4 2.4  --   --   --   PHOS 2.9 2.6  --  2.8 3.1  --   --   --     GFR: Estimated Creatinine Clearance: 87.3 mL/min (by C-G formula based on SCr of 0.75 mg/dL).  Liver Function Tests: Recent Labs  Lab 10/23/18 0652 10/24/18 0920 10/25/18 0705 10/26/18 0300 10/27/18 0115  AST 101* 76* 47* 49* 62*  ALT 51* 48* 39 43 53*  ALKPHOS 161* 162* 139* 153* 169*  BILITOT 1.1 0.2* <0.1* <0.1* <0.1*  PROT 6.8 6.8 6.2* 6.2* 6.9  ALBUMIN 2.6* 2.4* 2.2* 2.4* 2.7*    Recent Labs  Lab 10/22/18 1612  LIPASE 26    Cardiac Enzymes: Recent Labs  Lab 10/22/18 1625 10/23/18 0652 10/26/18 1346 10/26/18 1845 10/27/18 0115  TROPONINI 0.04* 0.03* 0.08* 0.05* 0.04*     Lipid Profile: Recent Labs    10/25/18 0705  TRIG 139    Anemia Panel: Recent Labs    10/26/18 0300 10/27/18 0115  FERRITIN 447* 401*    Recent Results (from the past 240 hour(s))  SARS Coronavirus 2 (Hosp order,Performed in [facility] lab via [employer])      Status: Abnormal   Collection Time: 10/22/18  4:16 PM  Result Value Ref Range Status   SARS Coronavirus 2 ([employer] Now) POSITIVE (A) NEGATIVE Final    Comment: RESULT CALLED TO, READ BACK BY AND VERIFIED WITH: CALLED TO S.GOUGE RN AT 1650 BY SROY ON [id number] (NOTE) Interpretive Result Comment(s): COVID 19 Positive SARS CoV 2 target nucleic acids are DETECTED. The SARS CoV 2 RNA is generally detectable in upper and lower respiratory specimens during the acute phase of infection.  Positive results are indicative of active infection with SARS CoV 2.  Clinical correlation with patient history and other diagnostic information is necessary to determine patient infection status.  Positive results do not rule out bacterial infection or coinfection with other viruses. The expected result is Negative. COVID 19 Negative SARS CoV 2 target nucleic acids are NOT DETECTED. The SARS CoV 2 RNA is generally detectable in upper and lower respiratory specimens during the acute phase of infection.  Negative results do not preclude SARS CoV 2 infection, do not rule out coinfections with other pathogens, and should not be used as the sole basis for  treatment or other patient management decisions.  Negative results must be combined with clinical observations, patient history, and epidemiological information. The expected result is Negative. Invalid Presence or absence of SARS CoV 2 nucleic acids cannot be determined. Repeat testing was performed on the submitted specimen and repeated Invalid results were obtained.  If clinically indicated, additional testing on a new specimen with an alternate test methodology [phone]) is advised.  The SARS CoV 2 RNA is generally detectable in upper and lower respiratory specimens during the acute phase of infection. The expected result is Negative. Fact Sheet for Patients:  http://www.graves-ford.org/ Fact Sheet for Healthcare  Providers: [employer] This test is not yet approved or cleared by the [place] FDA and has been authorized for detection and/or diagnosis of SARS CoV 2 by FDA under an Emergency Use Authorization (EUA).   This EUA will remain in effect (meaning this test can be used) for the duration of the COVID19 declaration under Section 564(b)(1) of the Act, 21 U.S.C. section [phone] 3(b)(1), unless the authorization is terminated or revoked sooner. Performed at [facility], [address] [place], [place] [address]   Culture, blood (routine x 2)     Status: None (Preliminary result)   Collection Time: 10/22/18  9:49 PM  Result Value Ref Range Status   Specimen Description BLOOD LEFT HAND  Final   Special Requests   Final    BOTTLES DRAWN AEROBIC ONLY Blood Culture adequate volume   Culture   Final    NO GROWTH 4 DAYS Performed at [facility], [address] [place], [place] [address]    Report Status PENDING  Incomplete  Culture, blood (routine x 2)     Status: Abnormal  Collection Time: 10/22/18  9:54 PM  Result Value Ref Range Status   Specimen Description   Final    BLOOD Performed at [facility], [address], [place], [place] [address]    Special Requests   Final    NONE Performed at [facility], [address], [place], [place] [address]    Culture  Setup Time   Final    GRAM POSITIVE COCCI IN CLUSTERS AEROBIC BOTTLE ONLY CRITICAL RESULT CALLED TO, READ BACK BY AND VERIFIED WITH: N. BATCHELDER,PHARMD 0543 10/24/2018 T. TYSOR    Culture (A)  Final    STAPHYLOCOCCUS SPECIES (COAGULASE NEGATIVE) THE SIGNIFICANCE OF ISOLATING THIS ORGANISM FROM A SINGLE SET OF BLOOD CULTURES WHEN MULTIPLE SETS ARE DRAWN IS UNCERTAIN. PLEASE NOTIFY THE MICROBIOLOGY DEPARTMENT WITHIN ONE WEEK IF SPECIATION AND SENSITIVITIES ARE REQUIRED. Performed at [facility], [address], [place],  [place] [address]    Report Status 10/26/2018 FINAL  Final  Blood Culture ID Panel (Reflexed)     Status: Abnormal   Collection Time: 10/22/18  9:54 PM  Result Value Ref Range Status   Enterococcus species NOT DETECTED NOT DETECTED Final   Listeria monocytogenes NOT DETECTED NOT DETECTED Final   Staphylococcus species DETECTED (A) NOT DETECTED Final    Comment: Methicillin (oxacillin) susceptible coagulase negative staphylococcus. Possible blood culture contaminant (unless isolated from more than one blood culture draw or clinical case suggests pathogenicity). No antibiotic treatment is indicated for blood  culture contaminants. CRITICAL RESULT CALLED TO, READ BACK BY AND VERIFIED WITH: N. BATCHELDER,PHARMD 0543 10/24/2018 T. TYSOR    Staphylococcus aureus (BCID) NOT DETECTED NOT DETECTED Final   Methicillin resistance NOT DETECTED NOT DETECTED Final   Streptococcus species NOT DETECTED NOT DETECTED Final   Streptococcus agalactiae NOT DETECTED NOT DETECTED Final   Streptococcus pneumoniae NOT DETECTED NOT DETECTED Final   Streptococcus pyogenes NOT DETECTED NOT DETECTED Final   Acinetobacter baumannii NOT DETECTED NOT DETECTED Final   Enterobacteriaceae species NOT DETECTED NOT DETECTED Final   Enterobacter cloacae complex NOT DETECTED NOT DETECTED Final   Escherichia coli NOT DETECTED NOT DETECTED Final   Klebsiella oxytoca NOT DETECTED NOT DETECTED Final   Klebsiella pneumoniae NOT DETECTED NOT DETECTED Final   Proteus species NOT DETECTED NOT DETECTED Final   Serratia marcescens NOT DETECTED NOT DETECTED Final   Haemophilus influenzae NOT DETECTED NOT DETECTED Final   Neisseria meningitidis NOT DETECTED NOT DETECTED Final   Pseudomonas aeruginosa NOT DETECTED NOT DETECTED Final   Candida albicans NOT DETECTED NOT DETECTED Final   Candida glabrata NOT DETECTED NOT DETECTED Final   Candida krusei NOT DETECTED NOT DETECTED Final   Candida parapsilosis NOT DETECTED NOT DETECTED Final    Candida tropicalis NOT DETECTED NOT DETECTED Final    Comment: Performed at [facility], [address] [place], [place] [address]  MRSA PCR Screening     Status: None   Collection Time: 10/25/18  2:25 AM  Result Value Ref Range Status   MRSA by PCR NEGATIVE NEGATIVE Final    Comment:        The GeneXpert MRSA Assay (FDA approved for NASAL specimens only), is one component of a comprehensive MRSA colonization surveillance program. It is not intended to diagnose MRSA infection nor to guide or monitor treatment for MRSA infections. Performed at [facility], [address], [place], [place] [address]       Radiology Studies: Dg Chest [place] 1  View  Result Date: 10/27/2018 CLINICAL DATA:  53 year old female with history of acute respiratory failure. Covered positive patient. EXAM: PORTABLE CHEST 1 VIEW COMPARISON:  Chest x-ray 10/25/2018. FINDINGS: There is a right upper extremity PICC with tip terminating in the right atrium. An endotracheal tube is in place with tip 2.1 cm above the carina. A nasogastric tube is seen extending into the stomach, however the tube appears coiled in the stomach with the tip of the tube extending cephalad located in the distal third of the esophagus. Lung volumes are low. Patchy multifocal interstitial and airspace disease is again noted throughout the lungs bilaterally, asymmetrically distributed, most confluent in the left lung base. Overall, there has been little change in aeration compared to the prior study. Possible small left pleural effusion (unchanged). No evidence of pulmonary edema. Heart size is normal. The patient is rotated to the left on today's exam, resulting in distortion of the mediastinal contours and reduced diagnostic sensitivity and specificity for mediastinal pathology. IMPRESSION: 1. Support apparatus, as above. Please take note of the fact that the nasogastric tube appears coiled upon itself with tip redirected  cephalad into the distal third of the esophagus. 2. Otherwise, unchanged radiographic appearance of the chest, as above. Electronically Signed   By: Trudie Reedaniel  Entrikin M.D.   On: 10/27/2018 06:20   Dg Chest Port 1 View  Result Date: 10/25/2018 CLINICAL DATA:  PICC line placement.  COVID-19 infection. EXAM: PORTABLE CHEST 1 VIEW COMPARISON:  Radiographs 10/25/2018 and 10/24/2018. FINDINGS: 1547 hours. The endotracheal and nasogastric tubes appear unchanged. There is a new right arm PICC, the tip of which is not well visualized. This appears to extend beyond the superior cavoatrial junction into the right atrium. Left-greater-than-right bibasilar airspace opacities and a possible small left pleural effusion are stable. There is no pneumothorax. The bones appear unchanged. IMPRESSION: 1. Tip of the new PICC line is not well visualized, but likely extends into the right atrium. This could be pulled back 2-3 cm for more optimal positioning. 2. The additional components of the support system are stable. 3. Stable findings of multilobar pneumonia. Electronically Signed   By: Carey BullocksWilliam  Veazey M.D.   On: 10/25/2018 16:27   The patient is critically ill with multi-organ failure.  Critical care was necessary to treat or prevent imminent or life-threatening deterioration of respiratory failure, and was exclusive of separately billable procedures and treating other patients. Total critical care time spent by me: 32 minutes Time spent personally by me on obtaining history from patient or surrogate, evaluation of the patient, evaluation of patient's response to treatment, ordering and review of laboratory studies, ordering and review of radiographic studies, ordering and performing treatments and interventions, and re-evaluation of the patient's condition.    LOS: 5 days   Huey Bienenstockawood Neviah Braud MD  [facility] Pager on [email or web address]  10/27/2018, 11:25 AM

## 2018-10-27 NOTE — Progress Notes (Signed)
Spoke with patients sister Dondra Spry. Advised that patient was resting and doing well and would nod her head when asked yes or no questions. She thanks [place] for everything that we're doing.

## 2018-10-27 NOTE — Progress Notes (Signed)
NAME:  Shannon Meyer, MRN:  [id number], DOB:  1965/06/29, LOS: 5 ADMISSION DATE:  10/22/2018, CONSULTATION DATE: Oct 22, 2018 REFERRING MD: Dr. Orland Dec, CHIEF COMPLAINT: Dyspnea  Brief History   53 year old female with no past medical history admitted from med [facility] on Oct 22, 2018 for acute respiratory failure with hypoxemia secondary to COVID-19 pneumonia  Past Medical History  No known past medical history  Significant Hospital Events   May 20 admission to [facility]  Consults:  Pulmonary critical care  Procedures:  May 20 ET tube  Significant Diagnostic Tests:    Micro Data:  SARS-CoV-2 May 20> positive Blood culture May 20 > 1 of 2 GPC (staph)>>   Antimicrobials/COVID Treatment:  Zithromax 5/20 >> 5/24 Rocephin 5/20 >> Remdesivir 5/21 >  Actemra 5/20> 5/21  Interim history/subjective:  Diuresing Tolerated pressure support 20/5 this morning, not 14/5  Objective   Blood pressure 112/77, pulse (!) 108, temperature 97.7 F (36.5 C), temperature source Oral, resp. rate (!) 24, height 5\' 4"  (1.626 m), weight 92 kg, SpO2 91 %.    Vent Mode: PRVC FiO2 (%):  [40 %-50 %] 40 % Set Rate:  [24 bmp] 24 bmp Vt Set:  [330 mL] 330 mL PEEP:  [8 cmH20] 8 cmH20 Plateau Pressure:  [21 cmH20-25 cmH20] 21 cmH20   Intake/Output Summary (Last 24 hours) at 10/27/2018 0724 Last data filed at 10/27/2018 0600 Gross per 24 hour  Intake 1470.22 ml  Output 4000 ml  Net -2529.78 ml   Filed Weights   10/25/18 0500 10/26/18 0400 10/27/18 0435  Weight: 96.4 kg 96.4 kg 92 kg    Examination:  General:  In bed on vent HENT: NCAT ETT in place PULM: CTA B, vent supported breathing CV: RRR, no mgr GI: BS+, soft, nontender MSK: normal bulk and tone Neuro: sedated on vent  Oct 27, 2018 chest x-ray images independently reviewed showing essentially no change in mild to moderate bilateral infiltrates, tip of OG tube is back up in esophagus  Resolved Hospital  Problem list     Assessment & Plan:  Acute respiratory failure with hypoxemia due to COVID-19: Oxygenation improving Start pressure support weaning attempts today Change sedation protocol to target RA SS score of -1 Continue diuresis with Lasix Ventilator associated pneumonia prevention protocol Wean Solu-Medrol Continue Remdesivir per protocol Attempt spontaneous breathing trial again this afternoon Daily wake-up assessment  Gen: Remove foley Reposition OG tube   Best practice:  Diet: Tube feeding Pain/Anxiety/Delirium protocol (if indicated): Yes, titrated to RA SS score of -1, fentanyl infusion, propofol infusion VAP protocol (if indicated): Yes DVT prophylaxis: Lovenox GI prophylaxis: Famotidine Glucose control: Sliding scale insulin Mobility: range of motion movements in bed Code Status: full Family Communication: will update today Disposition: remain in ICU  Labs   CBC: Recent Labs  Lab 10/22/18 1611  10/23/18 0652 10/24/18 0419 10/24/18 0920 10/25/18 0705 10/27/18 0115  WBC 16.4*  --  13.5*  --  22.4* 27.7* 31.3*  NEUTROABS 14.2*  --  11.3*  --  19.9* 24.2*  --   HGB 13.7   < > 12.0 12.9 12.5 11.2* 12.2  HCT 45.5   < > 39.8 38.0 40.7 38.0 40.2  MCV 91.0  --  90.0  --  91.1 92.7 92.0  PLT 256  --  219  --  299 348 443*   < > = values in this interval not displayed.    Basic Metabolic Panel: Recent Labs  Lab 10/23/18  [id number] 10/23/18 1443 10/24/18 0419 10/24/18 0920 10/24/18 1703 10/25/18 0705 10/26/18 0300 10/27/18 0115  NA 138  --  144 143  --  146* 147* 146*  K 4.6  --  3.2* 3.0*  --  4.3 3.8 4.1  CL 105  --   --  110  --  116* 116* 107  CO2 21*  --   --  22  --  22 27 28   GLUCOSE 103*  --   --  140*  --  124* 149* 140*  BUN 22*  --   --  29*  --  48* 43* 40*  CREATININE 0.80  --   --  0.78  --  0.81 0.65 0.75  CALCIUM 8.4*  --   --  8.4*  --  8.3* 8.1* 8.5*  MG 2.7* 2.4  --  2.4 2.4  --   --   --   PHOS 2.9 2.6  --  2.8 3.1  --   --   --     GFR: Estimated Creatinine Clearance: 87.3 mL/min (by C-G formula based on SCr of 0.75 mg/dL). Recent Labs  Lab 10/23/18 0651 10/23/18 0652 10/24/18 0920 10/25/18 0705 10/26/18 0300 10/27/18 0115  PROCALCITON  --   --  0.34 0.32 0.22 0.25  WBC  --  13.5* 22.4* 27.7*  --  31.3*  LATICACIDVEN 1.8  --   --   --   --   --     Liver Function Tests: Recent Labs  Lab 10/23/18 0652 10/24/18 0920 10/25/18 0705 10/26/18 0300 10/27/18 0115  AST 101* 76* 47* 49* 62*  ALT 51* 48* 39 43 53*  ALKPHOS 161* 162* 139* 153* 169*  BILITOT 1.1 0.2* <0.1* <0.1* <0.1*  PROT 6.8 6.8 6.2* 6.2* 6.9  ALBUMIN 2.6* 2.4* 2.2* 2.4* 2.7*   Recent Labs  Lab 10/22/18 1612  LIPASE 26   No results for input(s): AMMONIA in the last 168 hours.  ABG    Component Value Date/Time   PHART 7.386 10/24/2018 0419   PCO2ART 39.2 10/24/2018 0419   PO2ART 63.0 (L) 10/24/2018 0419   HCO3 23.6 10/24/2018 0419   TCO2 25 10/24/2018 0419   ACIDBASEDEF 1.0 10/24/2018 0419   O2SAT 92.0 10/24/2018 0419     Coagulation Profile: No results for input(s): INR, PROTIME in the last 168 hours.  Cardiac Enzymes: Recent Labs  Lab 10/22/18 1625 10/23/18 0652 10/26/18 1346 10/26/18 1845 10/27/18 0115  TROPONINI 0.04* 0.03* 0.08* 0.05* 0.04*    HbA1C: No results found for: HGBA1C  CBG: Recent Labs  Lab 10/26/18 1235 10/26/18 1528 10/26/18 1929 10/26/18 2329 10/27/18 0358  GLUCAP 122* 138* 128* 116* 113*       Critical care time: 35 minutes      Heber CarolinaBrent Leilana Mcquire, MD [place] [facility] Pager: [phone] Cell: [phone] If no response, call [phone]

## 2018-10-27 NOTE — Progress Notes (Signed)
Spoke with patients daughter. Advised her of any updates and encouraged her that her mom was doing well. She mentioned that her aunt Dondra Spry would be calling from [place] and it was ok to give Dondra Spry updates. She asked the process of weaning and asked to have day shift please call with updates once the process starts.

## 2018-10-28 ENCOUNTER — Inpatient Hospital Stay (HOSPITAL_COMMUNITY): Payer: HRSA Program

## 2018-10-28 LAB — COMPREHENSIVE METABOLIC PANEL
ALT: 61 U/L — ABNORMAL HIGH (ref 0–44)
AST: 75 U/L — ABNORMAL HIGH (ref 15–41)
Albumin: 2.8 g/dL — ABNORMAL LOW (ref 3.5–5.0)
Alkaline Phosphatase: 174 U/L — ABNORMAL HIGH (ref 38–126)
Anion gap: 10 (ref 5–15)
BUN: 39 mg/dL — ABNORMAL HIGH (ref 6–20)
CO2: 33 mmol/L — ABNORMAL HIGH (ref 22–32)
Calcium: 8.7 mg/dL — ABNORMAL LOW (ref 8.9–10.3)
Chloride: 100 mmol/L (ref 98–111)
Creatinine, Ser: 0.74 mg/dL (ref 0.44–1.00)
GFR calc Af Amer: >60 mL/min (ref >60)
GFR calc non Af Amer: >60 mL/min (ref >60)
Glucose, Bld: 104 mg/dL — ABNORMAL HIGH (ref 70–99)
Potassium: 3.9 mmol/L (ref 3.5–5.1)
Sodium: 143 mmol/L (ref 135–145)
Total Bilirubin: 0.3 mg/dL (ref 0.3–1.2)
Total Protein: 6.7 g/dL (ref 6.5–8.1)

## 2018-10-28 LAB — GLUCOSE, CAPILLARY
Glucose-Capillary: 95 mg/dL (ref 70–99)
Glucose-Capillary: 97 mg/dL (ref 70–99)
Glucose-Capillary: 94 mg/dL (ref 70–99)
Glucose-Capillary: 128 mg/dL — ABNORMAL HIGH (ref 70–99)
Glucose-Capillary: 144 mg/dL — ABNORMAL HIGH (ref 70–99)
Glucose-Capillary: 122 mg/dL — ABNORMAL HIGH (ref 70–99)

## 2018-10-28 LAB — CULTURE, BLOOD (ROUTINE X 2)
Culture: NO GROWTH
Special Requests: ADEQUATE

## 2018-10-28 LAB — CBC
HCT: 40.3 % (ref 36.0–46.0)
Hemoglobin: 12.4 g/dL (ref 12.0–15.0)
MCH: 28 pg (ref 26.0–34.0)
MCHC: 30.8 g/dL (ref 30.0–36.0)
MCV: 91 fL (ref 80.0–100.0)
Platelets: 449 10*3/uL — ABNORMAL HIGH (ref 150–400)
RBC: 4.43 MIL/uL (ref 3.87–5.11)
RDW: 15.9 % — ABNORMAL HIGH (ref 11.5–15.5)
WBC: 25.2 10*3/uL — ABNORMAL HIGH (ref 4.0–10.5)
nRBC: 0.6 % — ABNORMAL HIGH (ref 0.0–0.2)

## 2018-10-28 LAB — FERRITIN: Ferritin: 380 ng/mL — ABNORMAL HIGH (ref 11–307)

## 2018-10-28 LAB — C-REACTIVE PROTEIN: CRP: 5.8 mg/dL — ABNORMAL HIGH (ref <1.0)

## 2018-10-28 MED ORDER — SODIUM CHLORIDE 0.9 % IV BOLUS
500.0000 mL | Freq: Once | INTRAVENOUS | Status: AC
  Administered 2018-10-28: 500 mL via INTRAVENOUS

## 2018-10-28 MED ORDER — IOHEXOL 350 MG/ML SOLN
100.0000 mL | Freq: Once | INTRAVENOUS | Status: AC | PRN
  Administered 2018-10-28: 100 mL via INTRAVENOUS

## 2018-10-28 MED ORDER — FUROSEMIDE 10 MG/ML IJ SOLN
40.0000 mg | Freq: Two times a day (BID) | INTRAMUSCULAR | Status: DC
  Administered 2018-10-28: 40 mg via INTRAVENOUS
  Filled 2018-10-28: qty 4

## 2018-10-28 MED ORDER — CHLORHEXIDINE GLUCONATE CLOTH 2 % EX PADS
6.0000 | MEDICATED_PAD | Freq: Every day | CUTANEOUS | Status: DC
  Administered 2018-10-29 – 2018-12-09 (×38): 6 via TOPICAL

## 2018-10-28 MED ORDER — POLYETHYLENE GLYCOL 3350 17 G PO PACK
17.0000 g | PACK | Freq: Two times a day (BID) | ORAL | Status: DC
  Administered 2018-10-28 (×2): 17 g via ORAL
  Filled 2018-10-28: qty 1

## 2018-10-28 MED ORDER — POLYETHYLENE GLYCOL 3350 17 G PO PACK
17.0000 g | PACK | Freq: Every day | ORAL | Status: DC | PRN
  Administered 2018-11-12: 07:00:00 17 g via ORAL
  Filled 2018-10-28 (×3): qty 1

## 2018-10-28 MED ORDER — BISACODYL 5 MG PO TBEC
5.0000 mg | DELAYED_RELEASE_TABLET | Freq: Once | ORAL | Status: AC
  Administered 2018-10-28: 5 mg via ORAL
  Filled 2018-10-28: qty 1

## 2018-10-28 NOTE — Progress Notes (Signed)
NAME:  Shannon Meyer, MRN:  [id number], DOB:  Sep 19, 1965, LOS: 6 ADMISSION DATE:  10/22/2018, CONSULTATION DATE: Oct 22, 2018 REFERRING MD: Dr. Orland Dec, CHIEF COMPLAINT: Dyspnea  Brief History   53 year old female with no past medical history admitted from med [facility] on Oct 22, 2018 for acute respiratory failure with hypoxemia secondary to COVID-19 pneumonia  Past Medical History  No known past medical history  Significant Hospital Events   May 20 admission to [facility]  Consults:  Pulmonary critical care  Procedures:  May 20 ET tube  Significant Diagnostic Tests:    Micro Data:  SARS-CoV-2 May 20> positive Blood culture May 20 > 1 of 2 GPC (staph)>>   Antimicrobials/COVID Treatment:  Zithromax 5/20 >> 5/24 Rocephin 5/20 >> Remdesivir 5/21 >  Actemra 5/20> 5/21  Interim history/subjective:   More hypoxemic this morning despite diuresis overnight and overall improved ventilator settings.  Objective   Blood pressure 100/69, pulse 86, temperature 98.4 F (36.9 C), temperature source Oral, resp. rate (!) 21, height 5\' 4"  (1.626 m), weight 91.1 kg, SpO2 93 %.    Vent Mode: PRVC FiO2 (%):  [40 %-50 %] 40 % Set Rate:  [24 bmp] 24 bmp Vt Set:  [330 mL] 330 mL PEEP:  [5 cmH20-8 cmH20] 5 cmH20 Pressure Support:  [10 cmH20-20 cmH20] 10 cmH20 Plateau Pressure:  [20 cmH20-25 cmH20] 21 cmH20   Intake/Output Summary (Last 24 hours) at 10/28/2018 8768 Last data filed at 10/28/2018 0600 Gross per 24 hour  Intake 416.69 ml  Output 3980 ml  Net -3563.31 ml   Filed Weights   10/27/18 0435 10/28/18 0000 10/28/18 0423  Weight: 92 kg 91.1 kg 91.1 kg    Examination:  General:  In bed on vent HENT: NCAT ETT in place PULM: CTA B, vent supported breathing CV: RRR, no mgr GI: BS+, soft, nontender MSK: normal bulk and tone Neuro: drowsy, but will follow commands  Oct 27, 2018 chest x-ray images independently reviewed showing essentially no change  in mild to moderate bilateral infiltrates, tip of OG tube is back up in esophagus  Resolved Hospital Problem list     Assessment & Plan:  Acute respiratory failure with hypoxemia due to COVID-19: Oxygenation worse May 26 without obvious change in infiltrate on chest x-ray, concern for pulmonary embolism with associated tachycardia CT angiogram chest now Saline bolus to protect kidneys from IV contrast Continue full mechanical ventilatory support Hold off on diuresis for now Ventilator associated pneumonia prevention protocol Continue plans for spontaneous breathing trial  Constipation, abdominal distention (contributing to inability to wean?) Bowel regimen per [facility] hospitalists    Best practice:  Diet: Tube feeding Pain/Anxiety/Delirium protocol (if indicated): Yes, titrated to RA SS score of -1, fentanyl infusion, propofol infusion VAP protocol (if indicated): Yes DVT prophylaxis: Lovenox GI prophylaxis: Famotidine Glucose control: Sliding scale insulin Mobility: range of motion movements in bed Code Status: full Family Communication: will update today Disposition: remain in ICU  Labs   CBC: Recent Labs  Lab 10/22/18 1611  10/23/18 0652 10/24/18 0419 10/24/18 0920 10/25/18 0705 10/27/18 0115 10/28/18 0150  WBC 16.4*  --  13.5*  --  22.4* 27.7* 31.3* 25.2*  NEUTROABS 14.2*  --  11.3*  --  19.9* 24.2*  --   --   HGB 13.7   < > 12.0 12.9 12.5 11.2* 12.2 12.4  HCT 45.5   < > 39.8 38.0 40.7 38.0 40.2 40.3  MCV 91.0  --  90.0  --  91.1 92.7 92.0 91.0  PLT 256  --  219  --  299 348 443* 449*   < > = values in this interval not displayed.    Basic Metabolic Panel: Recent Labs  Lab 10/23/18 0652 10/23/18 1443  10/24/18 0920 10/24/18 1703 10/25/18 0705 10/26/18 0300 10/27/18 0115 10/28/18 0150  NA 138  --    < > 143  --  146* 147* 146* 143  K 4.6  --    < > 3.0*  --  4.3 3.8 4.1 3.9  CL 105  --   --  110  --  116* 116* 107 100  CO2 21*  --   --  22  --  22 27  28  33*  GLUCOSE 103*  --   --  140*  --  124* 149* 140* 104*  BUN 22*  --   --  29*  --  48* 43* 40* 39*  CREATININE 0.80  --   --  0.78  --  0.81 0.65 0.75 0.74  CALCIUM 8.4*  --   --  8.4*  --  8.3* 8.1* 8.5* 8.7*  MG 2.7* 2.4  --  2.4 2.4  --   --   --   --   PHOS 2.9 2.6  --  2.8 3.1  --   --   --   --    < > = values in this interval not displayed.   GFR: Estimated Creatinine Clearance: 86.9 mL/min (by C-G formula based on SCr of 0.74 mg/dL). Recent Labs  Lab 10/23/18 0651  10/24/18 0920 10/25/18 0705 10/26/18 0300 10/27/18 0115 10/28/18 0150  PROCALCITON  --   --  0.34 0.32 0.22 0.25  --   WBC  --    < > 22.4* 27.7*  --  31.3* 25.2*  LATICACIDVEN 1.8  --   --   --   --   --   --    < > = values in this interval not displayed.    Liver Function Tests: Recent Labs  Lab 10/24/18 0920 10/25/18 0705 10/26/18 0300 10/27/18 0115 10/28/18 0150  AST 76* 47* 49* 62* 75*  ALT 48* 39 43 53* 61*  ALKPHOS 162* 139* 153* 169* 174*  BILITOT 0.2* <0.1* <0.1* <0.1* 0.3  PROT 6.8 6.2* 6.2* 6.9 6.7  ALBUMIN 2.4* 2.2* 2.4* 2.7* 2.8*   Recent Labs  Lab 10/22/18 1612  LIPASE 26   No results for input(s): AMMONIA in the last 168 hours.  ABG    Component Value Date/Time   PHART 7.386 10/24/2018 0419   PCO2ART 39.2 10/24/2018 0419   PO2ART 63.0 (L) 10/24/2018 0419   HCO3 23.6 10/24/2018 0419   TCO2 25 10/24/2018 0419   ACIDBASEDEF 1.0 10/24/2018 0419   O2SAT 92.0 10/24/2018 0419     Coagulation Profile: No results for input(s): INR, PROTIME in the last 168 hours.  Cardiac Enzymes: Recent Labs  Lab 10/22/18 1625 10/23/18 0652 10/26/18 1346 10/26/18 1845 10/27/18 0115  TROPONINI 0.04* 0.03* 0.08* 0.05* 0.04*    HbA1C: No results found for: HGBA1C  CBG: Recent Labs  Lab 10/26/18 1929 10/26/18 2329 10/27/18 0358 10/27/18 1941 10/28/18 0353  GLUCAP 128* 116* 113* 109* 97       Critical care time: 31 minutes      Heber CarolinaBrent McQuaid, MD [place] [facility]  Pager: [phone] Cell: [phone] If no response, call [phone]

## 2018-10-28 NOTE — Progress Notes (Signed)
Received a call from CCM regarding 13 beat run of Vtach. Pt in NSR currently.

## 2018-10-28 NOTE — Progress Notes (Signed)
PICC catheter was pulled 3 cm more , that makes 7 cm mark out due to latest CXR being tip is in the RA. 3 ports with GBR. RN aware.

## 2018-10-28 NOTE — Progress Notes (Signed)
Spoke to [employer] she will reorder the CXR to confirm PICC tip after the PICC was pulled back to the 4cm mark. I spoke to [facility] in the radiology reading room and she agreed that another xray needed to be obtained due to the many overlying wires. Shannon Meyer

## 2018-10-28 NOTE — Progress Notes (Addendum)
Stacey from reading room called VAST RN, Burnett Harry and informed that Right arm PICC tip remains unchanged in right atrium and is stable.  Reported findings to supervisor, Dewain Penning for further follow-up.

## 2018-10-28 NOTE — Progress Notes (Signed)
VAST RN called radiology at [facility] to ask for re-reading of am chest x-ray to include PICC tip placement. VAST RN instructed to call reading room at [phone].  VAST RN called reading room and spoke with [facility]. She stated she would have someone re-read x-ray and she would call back with results.

## 2018-10-28 NOTE — Progress Notes (Addendum)
PROGRESS NOTE  Shannon Meyer WUJ:[id number] DOB: 1966-02-05 DOA: 10/22/2018  PCP: No primary care provider on file.  Brief History/Interval Summary:   - 53 year old female with unknown past medical history presented with 5-day history of weakness cough shortness of breath.  She was found to be significantly hypoxic when she was evaluated in the emergency department.  She was positive for COVID-19.  She was intubated and then transferred to the hospital for further management.  Reason for Visit:  Acute respiratory failure with hypoxia secondary to COVID-19   Subjective/Interval History: Patient remains intubated sedated.  No significant events overnight.,  Could  not tolerate SBT earlier today.  Assessment/Plan:  Acute Hypoxic Resp. Failure due to Acute Covid 19 Viral Illness  Vent Mode: PRVC FiO2 (%):  [40 %-50 %] 40 % Set Rate:  [24 bmp] 24 bmp Vt Set:  [330 mL] 330 mL PEEP:  [5 cmH20-8 cmH20] 5 cmH20 Pressure Support:  [10 cmH20-20 cmH20] 10 cmH20 Plateau Pressure:  [20 cmH20-25 cmH20] 21 cmH20    - Patient remains on mechanical ventilation, she is on ARDS protocol,6 cc/Kg , not tolerating SBT well this morning, but lowering her sedation, return for pulmonary embolism with associated tachycardia, plan for CT angiogram chest PE protocol per PCCM, I will hold her Lasix today, and she will receive fluid bolus to protect her kidneys.  Continue SBT trials later today.-on Remdesivir : initiated 5/21 -Treated with IV Solu-Medrol ,stopped 10/28/2018 -Received Actemra x2 on 5/21, LFTs mildly elevated, continue to monitor. -Continue with vitamin C and zinc -She is on IV diuresis (I will hold today given she will receive IV contrast), diuresed well yesterday, -3.5 L over last 24 hours. -Continue to follow inflammatory markers closely, trending down. -Treated with azithromycin (5 days), and Rocephin (7 days) . - On DVT prophylaxis per COVID 19 ICU protoco - on sedation , D/C staff will  minimize for Target A SS score of -1  The treatment plan and use of medications and known side effects were discussed with patient/family. It was clearly explained that there is no proven definitive treatment for COVID-19 infection yet. Any medications used here are based on case reports/anecdotal data which are not peer-reviewed and has not been studied using randomized control trials.  Complete risks and long-term side effects are unknown, however in the best clinical judgment they seem to be of some clinical benefit rather than medical risks.  Patient/family agree with the treatment plan and want to receive these treatments as indicated.  Patient was given Actemra and steroids.  COVID-19 Labs  Recent Labs    10/26/18 0300 10/27/18 0115 10/28/18 0150  DDIMER 1.35* 1.51*  --   FERRITIN 447* 401* 380*  CRP 10.1* 7.8* 5.8*    Lab Results  Component Value Date   SARSCOV2NAA POSITIVE (A) 10/22/2018     Sepsis present on admission -Blood Pressure has been soft initially, she did require brief pressors support Levophed initially, but now pressor requirement since. -Procalcitonin mildly elevated, trending down, treated empiric ceftriaxone and azithromycin.  Leukocytosis Patient noted to have leukocytosis even at admission.  Trending up, but this is most likely in the setting of steroids, procalcitonin is trending down, continue to monitor closely .  Transaminitis -Mildly elevated, trending up, slowly, most likely as she received Actemra, continue to monitor closely   QT prolongation Noted to be 490 ms.  Avoid QT prolonging agents as much as possible.  Hypernatremia -Resolved, she is on free water via PEG  Patient  started  on bowel regimen given constipation  DVT Prophylaxis:  Lovenox 40 mg every 12 hours  Lab Results  Component Value Date   PLT 299 10/24/2018     PUD Prophylaxis: Famotidine Code Status: Full code Family Communication: Updating her daughter daily, will call  again today Disposition Plan: Will remain in ICU   Consultants: Pulmonology  Procedures: Intubation  Antibiotics: Anti-infectives (From admission, onward)   Start     Dose/Rate Route Frequency Ordered Stop   10/27/18 2200  cefTRIAXone (ROCEPHIN) 2 g in sodium chloride 0.9 % 100 mL IVPB     2 g 200 mL/hr over 30 Minutes Intravenous Every 24 hours 10/27/18 0835 10/29/18 2159   10/24/18 1400  remdesivir 100 mg in sodium chloride 0.9 % 250 mL IVPB  Status:  Discontinued     100 mg over 30 Minutes Intravenous Every 24 hours 10/23/18 1153 10/24/18 1335   10/24/18 1400  remdesivir 100 mg in sodium chloride 0.9 % 220 mL IVPB  Status:  Discontinued     100 mg over 30 Minutes Intravenous Every 24 hours 10/24/18 1334 10/24/18 1336   10/24/18 1400  remdesivir 100 mg in sodium chloride 0.9 % 230 mL IVPB     100 mg over 30 Minutes Intravenous Every 24 hours 10/24/18 1335 11/02/18 1359   10/24/18 1400  remdesivir 100 mg in sodium chloride 0.9 % 230 mL IVPB  Status:  Discontinued     100 mg over 30 Minutes Intravenous Every 24 hours 10/24/18 1337 10/24/18 1339   10/23/18 1400  remdesivir 200 mg in sodium chloride 0.9 % 250 mL IVPB     200 mg over 30 Minutes Intravenous Once 10/23/18 1153 10/23/18 1738   10/22/18 2200  azithromycin (ZITHROMAX) 500 mg in sodium chloride 0.9 % 250 mL IVPB     500 mg 250 mL/hr over 60 Minutes Intravenous Every 24 hours 10/22/18 2148 10/26/18 2255   10/22/18 2200  cefTRIAXone (ROCEPHIN) 1 g in sodium chloride 0.9 % 100 mL IVPB  Status:  Discontinued     1 g 200 mL/hr over 30 Minutes Intravenous Every 24 hours 10/22/18 2148 10/27/18 0835      Medications:  Scheduled: . chlorhexidine  15 mL Mouth/Throat BID  . Chlorhexidine Gluconate Cloth  6 each Topical Daily  . enoxaparin (LOVENOX) injection  40 mg Subcutaneous Q12H  . famotidine  20 mg Per Tube Q12H  . feeding supplement (PRO-STAT SUGAR FREE 64)  60 mL Per Tube Daily  . feeding supplement (VITAL HIGH  PROTEIN)  1,000 mL Per Tube Q24H  . free water  300 mL Per Tube Q4H  . mouth rinse  15 mL Mouth Rinse 10 times per day  . methylPREDNISolone (SOLU-MEDROL) injection  40 mg Intravenous Daily  . multivitamin with minerals  1 tablet Per Tube Daily  . sodium chloride flush  10-40 mL Intracatheter Q12H  . sodium chloride flush  3 mL Intravenous Q12H  . vitamin C  500 mg Per Tube Daily  . zinc sulfate  220 mg Per Tube Daily   Continuous: . sodium chloride 10 mL/hr at 10/28/18 0644  . cefTRIAXone (ROCEPHIN)  IV Stopped (10/27/18 2205)  . fentaNYL infusion INTRAVENOUS 50 mcg/hr (10/28/18 0600)  . norepinephrine (LEVOPHED) Adult infusion Stopped (10/24/18 2045)  . propofol (DIPRIVAN) infusion 20 mcg/kg/min (10/28/18 0957)  . remdesivir 100 mg in NS 250 mL    . sodium chloride     ZOX:WRUEAVWUJWJXB (TYLENOL) oral liquid 160 mg/5 mL, bisacodyl, docusate, fentaNYL, fentaNYL (  SUBLIMAZE) injection, hydrALAZINE, midazolam, polyethylene glycol, sodium chloride flush   Objective:  Vital Signs  Vitals:   10/28/18 0530 10/28/18 0600 10/28/18 0630 10/28/18 0743  BP: 107/69 116/74 100/69 96/62  Pulse: 99 91 86 95  Resp: (!) 24 20 (!) 21 (!) 24  Temp:      TempSrc:      SpO2: 92% 94% 93% 95%  Weight:      Height:        Intake/Output Summary (Last 24 hours) at 10/28/2018 1144 Last data filed at 10/28/2018 0600 Gross per 24 hour  Intake 416.69 ml  Output 3630 ml  Net -3213.31 ml   Filed Weights   10/27/18 0435 10/28/18 0000 10/28/18 0423  Weight: 92 kg 91.1 kg 91.1 kg   Sedated, intubated, in no apparent distress, follows simple commands and interactive once minimize her sedation . ET tube, OG tube in place  Reported breathing, clear to auscultation bilaterally, no wheezing  Regular rate, mildly tachycardic once minimize her sedation, no rubs or gallops Abdomen soft, nontender, bowel sounds present Extremities with no edema, clubbing or cyanosis     Lab Results:  Data Reviewed: I  have personally reviewed following labs and imaging studies  CBC: Recent Labs  Lab 10/22/18 1611  10/23/18 0652 10/24/18 0419 10/24/18 0920 10/25/18 0705 10/27/18 0115 10/28/18 0150  WBC 16.4*  --  13.5*  --  22.4* 27.7* 31.3* 25.2*  NEUTROABS 14.2*  --  11.3*  --  19.9* 24.2*  --   --   HGB 13.7   < > 12.0 12.9 12.5 11.2* 12.2 12.4  HCT 45.5   < > 39.8 38.0 40.7 38.0 40.2 40.3  MCV 91.0  --  90.0  --  91.1 92.7 92.0 91.0  PLT 256  --  219  --  299 348 443* 449*   < > = values in this interval not displayed.    Basic Metabolic Panel: Recent Labs  Lab 10/23/18 0652 10/23/18 1443  10/24/18 0920 10/24/18 1703 10/25/18 0705 10/26/18 0300 10/27/18 0115 10/28/18 0150  NA 138  --    < > 143  --  146* 147* 146* 143  K 4.6  --    < > 3.0*  --  4.3 3.8 4.1 3.9  CL 105  --   --  110  --  116* 116* 107 100  CO2 21*  --   --  22  --  22 27 28  33*  GLUCOSE 103*  --   --  140*  --  124* 149* 140* 104*  BUN 22*  --   --  29*  --  48* 43* 40* 39*  CREATININE 0.80  --   --  0.78  --  0.81 0.65 0.75 0.74  CALCIUM 8.4*  --   --  8.4*  --  8.3* 8.1* 8.5* 8.7*  MG 2.7* 2.4  --  2.4 2.4  --   --   --   --   PHOS 2.9 2.6  --  2.8 3.1  --   --   --   --    < > = values in this interval not displayed.    GFR: Estimated Creatinine Clearance: 86.9 mL/min (by C-G formula based on SCr of 0.74 mg/dL).  Liver Function Tests: Recent Labs  Lab 10/24/18 0920 10/25/18 0705 10/26/18 0300 10/27/18 0115 10/28/18 0150  AST 76* 47* 49* 62* 75*  ALT 48* 39 43 53* 61*  ALKPHOS 162* 139* 153*  169* 174*  BILITOT 0.2* <0.1* <0.1* <0.1* 0.3  PROT 6.8 6.2* 6.2* 6.9 6.7  ALBUMIN 2.4* 2.2* 2.4* 2.7* 2.8*    Recent Labs  Lab 10/22/18 1612  LIPASE 26    Cardiac Enzymes: Recent Labs  Lab 10/22/18 1625 10/23/18 0652 10/26/18 1346 10/26/18 1845 10/27/18 0115  TROPONINI 0.04* 0.03* 0.08* 0.05* 0.04*     Lipid Profile: No results for input(s): CHOL, HDL, LDLCALC, TRIG, CHOLHDL, LDLDIRECT in  the last 72 hours.  Anemia Panel: Recent Labs    10/27/18 0115 10/28/18 0150  FERRITIN 401* 380*    Recent Results (from the past 240 hour(s))  SARS Coronavirus 2 (Hosp order,Performed in [facility] lab via [employer] ID)     Status: Abnormal   Collection Time: 10/22/18  4:16 PM  Result Value Ref Range Status   SARS Coronavirus 2 ([employer] Now) POSITIVE (A) NEGATIVE Final    Comment: RESULT CALLED TO, READ BACK BY AND VERIFIED WITH: CALLED TO S.GOUGE RN AT 1650 BY SROY ON [id number] (NOTE) Interpretive Result Comment(s): COVID 19 Positive SARS CoV 2 target nucleic acids are DETECTED. The SARS CoV 2 RNA is generally detectable in upper and lower respiratory specimens during the acute phase of infection.  Positive results are indicative of active infection with SARS CoV 2.  Clinical correlation with patient history and other diagnostic information is necessary to determine patient infection status.  Positive results do not rule out bacterial infection or coinfection with other viruses. The expected result is Negative. COVID 19 Negative SARS CoV 2 target nucleic acids are NOT DETECTED. The SARS CoV 2 RNA is generally detectable in upper and lower respiratory specimens during the acute phase of infection.  Negative results do not preclude SARS CoV 2 infection, do not rule out coinfections with other pathogens, and should not be used as the sole basis for  treatment or other patient management decisions.  Negative results must be combined with clinical observations, patient history, and epidemiological information. The expected result is Negative. Invalid Presence or absence of SARS CoV 2 nucleic acids cannot be determined. Repeat testing was performed on the submitted specimen and repeated Invalid results were obtained.  If clinically indicated, additional testing on a new specimen with an alternate test methodology [phone]) is advised.  The SARS CoV 2 RNA is generally detectable  in upper and lower respiratory specimens during the acute phase of infection. The expected result is Negative. Fact Sheet for Patients:  http://www.graves-ford.org/ Fact Sheet for Healthcare Providers: EnviroConcern.si This test is not yet approved or cleared by the [place] FDA and has been authorized for detection and/or diagnosis of SARS CoV 2 by FDA under an Emergency Use Authorization (EUA).   This EUA will remain in effect (meaning this test can be used) for the duration of the COVID19 declaration under Section 564(b)(1) of the Act, 21 U.S.C. section [phone] 3(b)(1), unless the authorization is terminated or revoked sooner. Performed at [facility], [address] [place], [place] [address]   Culture, blood (routine x 2)     Status: None   Collection Time: 10/22/18  9:49 PM  Result Value Ref Range Status   Specimen Description BLOOD LEFT HAND  Final   Special Requests   Final    BOTTLES DRAWN AEROBIC ONLY Blood Culture adequate volume   Culture   Final    NO GROWTH 5 DAYS Performed at [facility], [address], [place], [place] [address]  Report Status 10/28/2018 FINAL  Final  Culture, blood (routine x 2)     Status: Abnormal   Collection Time: 10/22/18  9:54 PM  Result Value Ref Range Status   Specimen Description   Final    BLOOD Performed at [facility], [address] [place], [place] [address]    Special Requests   Final    NONE Performed at [facility], [address], [place], [place] [address]    Culture  Setup Time   Final    GRAM POSITIVE COCCI IN CLUSTERS AEROBIC BOTTLE ONLY CRITICAL RESULT CALLED TO, READ BACK BY AND VERIFIED WITH: N. BATCHELDER,PHARMD 0543 10/24/2018 T. TYSOR    Culture (A)  Final    STAPHYLOCOCCUS SPECIES (COAGULASE NEGATIVE) THE SIGNIFICANCE OF ISOLATING THIS ORGANISM FROM A SINGLE SET OF BLOOD CULTURES WHEN MULTIPLE SETS ARE  DRAWN IS UNCERTAIN. PLEASE NOTIFY THE MICROBIOLOGY DEPARTMENT WITHIN ONE WEEK IF SPECIATION AND SENSITIVITIES ARE REQUIRED. Performed at [facility], [address], [place], [place] [address]    Report Status 10/26/2018 FINAL  Final  Blood Culture ID Panel (Reflexed)     Status: Abnormal   Collection Time: 10/22/18  9:54 PM  Result Value Ref Range Status   Enterococcus species NOT DETECTED NOT DETECTED Final   Listeria monocytogenes NOT DETECTED NOT DETECTED Final   Staphylococcus species DETECTED (A) NOT DETECTED Final    Comment: Methicillin (oxacillin) susceptible coagulase negative staphylococcus. Possible blood culture contaminant (unless isolated from more than one blood culture draw or clinical case suggests pathogenicity). No antibiotic treatment is indicated for blood  culture contaminants. CRITICAL RESULT CALLED TO, READ BACK BY AND VERIFIED WITH: N. BATCHELDER,PHARMD 0543 10/24/2018 T. TYSOR    Staphylococcus aureus (BCID) NOT DETECTED NOT DETECTED Final   Methicillin resistance NOT DETECTED NOT DETECTED Final   Streptococcus species NOT DETECTED NOT DETECTED Final   Streptococcus agalactiae NOT DETECTED NOT DETECTED Final   Streptococcus pneumoniae NOT DETECTED NOT DETECTED Final   Streptococcus pyogenes NOT DETECTED NOT DETECTED Final   Acinetobacter baumannii NOT DETECTED NOT DETECTED Final   Enterobacteriaceae species NOT DETECTED NOT DETECTED Final   Enterobacter cloacae complex NOT DETECTED NOT DETECTED Final   Escherichia coli NOT DETECTED NOT DETECTED Final   Klebsiella oxytoca NOT DETECTED NOT DETECTED Final   Klebsiella pneumoniae NOT DETECTED NOT DETECTED Final   Proteus species NOT DETECTED NOT DETECTED Final   Serratia marcescens NOT DETECTED NOT DETECTED Final   Haemophilus influenzae NOT DETECTED NOT DETECTED Final   Neisseria meningitidis NOT DETECTED NOT DETECTED Final   Pseudomonas aeruginosa NOT DETECTED NOT DETECTED Final   Candida albicans NOT  DETECTED NOT DETECTED Final   Candida glabrata NOT DETECTED NOT DETECTED Final   Candida krusei NOT DETECTED NOT DETECTED Final   Candida parapsilosis NOT DETECTED NOT DETECTED Final   Candida tropicalis NOT DETECTED NOT DETECTED Final    Comment: Performed at [facility], [address], [place], [place] [address]  MRSA PCR Screening     Status: None   Collection Time: 10/25/18  2:25 AM  Result Value Ref Range Status   MRSA by PCR NEGATIVE NEGATIVE Final    Comment:        The GeneXpert MRSA Assay (FDA approved for NASAL specimens only), is one component of a comprehensive MRSA colonization surveillance program. It is not intended to diagnose MRSA infection nor to guide or monitor treatment for MRSA infections. Performed at [facility], [address], [place], [place] [address]       Radiology Studies: Dg Abd 1 View  Result Date: 10/27/2018 CLINICAL DATA:  Feeding tube adjusted EXAM: ABDOMEN - 1 VIEW COMPARISON:  10/27/2018 FINDINGS: Interval repositioning of a non weighted esophagogastric tube, with the tip in the vicinity of the the duodenal bulb and the side-port in the vicinity of the pylorus. Nonobstructive pattern of bowel gas with gas present to the rectum. IMPRESSION: Interval repositioning of a non weighted esophagogastric tube, with the tip in the vicinity of the the duodenal bulb and the side-port in the vicinity of the pylorus. Nonobstructive pattern of bowel gas with gas present to the rectum. Electronically Signed   By: Lauralyn Primes M.D.   On: 10/27/2018 15:22   Dg Abd 1 View  Result Date: 10/27/2018 CLINICAL DATA:  Feeding tube. EXAM: ABDOMEN - 1 VIEW COMPARISON:  Chest radiograph 10/27/2018 at 0444 hours FINDINGS: An enteric tube loops back upon itself in the gastric body and courses superiorly into the esophagus with tip not imaged, however this was included on today's earlier chest radiograph where the tube had a similar configuration and  terminated over the lower half of the thoracic esophagus. The side hole is near the GE junction. Gas is present in nondilated bowel loops without evidence of obstruction. No acute osseous abnormality is seen. IMPRESSION: Enteric tube looped upon itself in the stomach with tip in the esophagus, incompletely imaged. Recommend retracting the tube and reimaging. Electronically Signed   By: Sebastian Ache M.D.   On: 10/27/2018 11:51   Dg Chest Port 1 View  Result Date: 10/27/2018 CLINICAL DATA:  53 year old female with history of acute respiratory failure. Covered positive patient. EXAM: PORTABLE CHEST 1 VIEW COMPARISON:  Chest x-ray 10/25/2018. FINDINGS: There is a right upper extremity PICC with tip terminating in the right atrium. An endotracheal tube is in place with tip 2.1 cm above the carina. A nasogastric tube is seen extending into the stomach, however the tube appears coiled in the stomach with the tip of the tube extending cephalad located in the distal third of the esophagus. Lung volumes are low. Patchy multifocal interstitial and airspace disease is again noted throughout the lungs bilaterally, asymmetrically distributed, most confluent in the left lung base. Overall, there has been little change in aeration compared to the prior study. Possible small left pleural effusion (unchanged). No evidence of pulmonary edema. Heart size is normal. The patient is rotated to the left on today's exam, resulting in distortion of the mediastinal contours and reduced diagnostic sensitivity and specificity for mediastinal pathology. IMPRESSION: 1. Support apparatus, as above. Please take note of the fact that the nasogastric tube appears coiled upon itself with tip redirected cephalad into the distal third of the esophagus. 2. Otherwise, unchanged radiographic appearance of the chest, as above. Electronically Signed   By: Trudie Reed M.D.   On: 10/27/2018 06:20   The patient is critically ill with multi-organ  failure.  Critical care was necessary to treat or prevent imminent or life-threatening deterioration of respiratory failure, and was exclusive of separately billable procedures and treating other patients. Total critical care time spent by me: 35 minutes Time spent personally by me on obtaining history from patient or surrogate, evaluation of the patient, evaluation of patient's response to treatment, ordering and review of laboratory studies, ordering and review of radiographic studies, ordering and performing treatments and interventions, and re-evaluation of the patient's condition.    LOS: 6 days  Huey Bienenstock MD  [facility] Pager on [email or web address]  10/28/2018, 11:44 AM

## 2018-10-29 DIAGNOSIS — R945 Abnormal results of liver function studies: Secondary | ICD-10-CM

## 2018-10-29 DIAGNOSIS — R9431 Abnormal electrocardiogram [ECG] [EKG]: Secondary | ICD-10-CM

## 2018-10-29 LAB — GLUCOSE, CAPILLARY
Glucose-Capillary: 107 mg/dL — ABNORMAL HIGH (ref 70–99)
Glucose-Capillary: 108 mg/dL — ABNORMAL HIGH (ref 70–99)
Glucose-Capillary: 81 mg/dL (ref 70–99)
Glucose-Capillary: 84 mg/dL (ref 70–99)
Glucose-Capillary: 88 mg/dL (ref 70–99)
Glucose-Capillary: 96 mg/dL (ref 70–99)

## 2018-10-29 LAB — C-REACTIVE PROTEIN: CRP: 6 mg/dL — ABNORMAL HIGH (ref <1.0)

## 2018-10-29 LAB — COMPREHENSIVE METABOLIC PANEL
ALT: 45 U/L — ABNORMAL HIGH (ref 0–44)
AST: 44 U/L — ABNORMAL HIGH (ref 15–41)
Albumin: 2.4 g/dL — ABNORMAL LOW (ref 3.5–5.0)
Alkaline Phosphatase: 135 U/L — ABNORMAL HIGH (ref 38–126)
Anion gap: 13 (ref 5–15)
BUN: 38 mg/dL — ABNORMAL HIGH (ref 6–20)
CO2: 29 mmol/L (ref 22–32)
Calcium: 7.9 mg/dL — ABNORMAL LOW (ref 8.9–10.3)
Chloride: 95 mmol/L — ABNORMAL LOW (ref 98–111)
Creatinine, Ser: 0.63 mg/dL (ref 0.44–1.00)
GFR calc Af Amer: >60 mL/min (ref >60)
GFR calc non Af Amer: >60 mL/min (ref >60)
Glucose, Bld: 102 mg/dL — ABNORMAL HIGH (ref 70–99)
Potassium: 3.3 mmol/L — ABNORMAL LOW (ref 3.5–5.1)
Sodium: 137 mmol/L (ref 135–145)
Total Bilirubin: 0.1 mg/dL — ABNORMAL LOW (ref 0.3–1.2)
Total Protein: 5.7 g/dL — ABNORMAL LOW (ref 6.5–8.1)

## 2018-10-29 LAB — CBC
HCT: 36.5 % (ref 36.0–46.0)
Hemoglobin: 11.3 g/dL — ABNORMAL LOW (ref 12.0–15.0)
MCH: 27.6 pg (ref 26.0–34.0)
MCHC: 31 g/dL (ref 30.0–36.0)
MCV: 89 fL (ref 80.0–100.0)
Platelets: 392 10*3/uL (ref 150–400)
RBC: 4.1 MIL/uL (ref 3.87–5.11)
RDW: 15.8 % — ABNORMAL HIGH (ref 11.5–15.5)
WBC: 17.9 10*3/uL — ABNORMAL HIGH (ref 4.0–10.5)
nRBC: 0.3 % — ABNORMAL HIGH (ref 0.0–0.2)

## 2018-10-29 MED ORDER — POLYETHYLENE GLYCOL 3350 17 G PO PACK
34.0000 g | PACK | Freq: Two times a day (BID) | ORAL | Status: AC
  Administered 2018-10-29: 21:00:00 34 g via ORAL
  Filled 2018-10-29: qty 2

## 2018-10-29 MED ORDER — POTASSIUM CHLORIDE 20 MEQ/15ML (10%) PO SOLN
20.0000 meq | ORAL | Status: AC
  Administered 2018-10-29 (×2): 20 meq
  Filled 2018-10-29 (×2): qty 15

## 2018-10-29 MED ORDER — POTASSIUM CHLORIDE 20 MEQ/15ML (10%) PO SOLN
20.0000 meq | Freq: Once | ORAL | Status: AC
  Administered 2018-10-29: 18:00:00 20 meq via ORAL
  Filled 2018-10-29: qty 15

## 2018-10-29 MED ORDER — BISACODYL 5 MG PO TBEC
10.0000 mg | DELAYED_RELEASE_TABLET | Freq: Once | ORAL | Status: DC
  Filled 2018-10-29: qty 2

## 2018-10-29 NOTE — Progress Notes (Signed)
[facility] ADULT ICU REPLACEMENT PROTOCOL FOR AM LAB REPLACEMENT ONLY  The patient does apply for the [facility] Adult ICU Electrolyte Replacment Protocol based on the criteria listed below:   1. Is GFR >/= 40 ml/min? Yes.    Patient's GFR today is >60 2. Is urine output >/= 0.5 ml/kg/hr for the last 6 hours? Yes.   Patient's UOP is .7 ml/kg/hr 3. Is BUN < 60 mg/dL? Yes.    Patient's BUN today is 38 4. Abnormal electrolyte(s): K-3.3 5. Ordered repletion with: per protocol 6. If a panic level lab has been reported, has the CCM MD in charge been notified? Yes.  .   Physician:  Dr. Janne Lab, Gan Boos 10/29/2018 6:20 AM

## 2018-10-29 NOTE — Progress Notes (Signed)
Spoke with patients sister, Dondra Spry. She again inquired about any changes and updates. Thanks [place] all for her continued care

## 2018-10-29 NOTE — Progress Notes (Signed)
I spoke with patients sister and daughter several times today. I updated the family on Patients progress

## 2018-10-29 NOTE — Progress Notes (Signed)
Nutrition Follow-up RD working remotely.  DOCUMENTATION CODES:   Obesity unspecified  INTERVENTION:   Continue TF via OGT:  Vital High Protein at 40 ml/h (960 ml per day)  Pro-stat 60 ml once daily  Provides 1160 kcal (1345 kcal total with current propofol), 114 gm protein, 803 ml free water daily (2.6 L total free water including flushes)  NUTRITION DIAGNOSIS:   Inadequate oral intake related to inability to eat as evidenced by NPO status.  Ongoing   GOAL:   Provide needs based on ASPEN/SCCM guidelines  Met with TF  MONITOR:   Vent status, TF tolerance, Labs, Skin  ASSESSMENT:   53 yo female with no significant PMH who was admitted with COVID-19.  Patient remains intubated on ventilator support MV: 8.4 L/min Temp (24hrs), Avg:98.2 F (36.8 C), Min:98 F (36.7 C), Max:98.6 F (37 C)  Propofol at 7 ml/h providing 185 kcal from lipid.  Labs reviewed. Potassium 3.3 (L), BUN 38 (H) CBG's: 81-96 this morning  Medications reviewed and include MVI, KCl, vitamin C, zinc. Bowel regimen has been started for constipation.   Patient is currently receiving Vital High Protein via OGT at 40 ml/h (960 ml/day) with Prostat 60 ml once daily to provide 1160 kcals, 114 gm protein, 803 ml free water daily.  Free water flushes 300 ml every 4 hours. Total intake with propofol is 1345 kcal.  I/O -3 L since admission   Diet Order:   Diet Order            Diet NPO time specified  Diet effective now              EDUCATION NEEDS:   No education needs have been identified at this time  Skin:  Skin Assessment: Reviewed RN Assessment  Last BM:  5/23 (type 7)   Height:   Ht Readings from Last 1 Encounters:  10/22/18 '5\' 4"'  (1.626 m)    Weight:   Wt Readings from Last 1 Encounters:  10/29/18 94 kg   10/23/18 94.7 kg    Ideal Body Weight:  54.5 kg  BMI:  Body mass index is 35.57 kg/m.  Estimated Nutritional Needs:   Kcal:  7989-2119  Protein:  109  gm  Fluid:  >/= 1.6 L    Molli Barrows, RD, LDN, [place] Pager [phone] After Hours Pager [phone]

## 2018-10-29 NOTE — Progress Notes (Signed)
PROGRESS NOTE  Shannon Meyer ZOX:[id number] DOB: 1965/10/14 DOA: 10/22/2018  PCP: No primary care provider on file.  Brief History/Interval Summary:  53 year old female with unknown past medical history presented with 5-day history of weakness cough shortness of breath.  She was found to be significantly hypoxic when she was evaluated in the emergency department.  She was positive for COVID-19.  She was intubated and then transferred to [facility] hospital for further management.  Reason for Visit:  Acute respiratory failure with hypoxia secondary to COVID-19   Subjective/Interval History: Patient remains intubated sedated.  No significant events overnight.,  No BM over last couple days per staff, tolerating SBT so far this am.  Assessment/Plan:  Acute Hypoxic Resp. Failure due to Acute Covid 19 Viral Illness  - Patient remains on mechanical ventilation, she is on ARDS protocol,6 cc/Kg , did not tolerate SBT yesterday, but this morning so far she has been tolerating her weaning ,Continue  to lighten sedation as well. not tolerating SBT well this morning, but lowering her sedation, return for pulmonary embolism with -  -CT chest PE protocol with no evidence of PE large vessel, but significant for groundglass opacities in both lungs related to COVID-19. -on Remdesivir : initiated 5/21 -Treated with IV Solu-Medrol ,stopped 10/28/2018 -Received Actemra x2 on 5/21, due to trend LFTs closely, mildly elevated  -Continue with vitamin C and zinc -She received IV contrast, I will hold again today as well can be resumed tomorrow, so far she is -3.1 L since admission . -Continue to follow inflammatory markers closely, CRP remains with no significant change. -Treated with azithromycin (5 days), and Rocephin (7 days) . - On DVT prophylaxis per COVID 19 ICU protoco - on sedation , D/W staff will minimize for Target A SS score of -1  The treatment plan and use of medications and known side effects were  discussed with patient/family. It was clearly explained that there is no proven definitive treatment for COVID-19 infection yet. Any medications used here are based on case reports/anecdotal data which are not peer-reviewed and has not been studied using randomized control trials.  Complete risks and long-term side effects are unknown, however in the best clinical judgment they seem to be of some clinical benefit rather than medical risks.  Patient/family agree with the treatment plan and want to receive these treatments as indicated.  Patient was given Actemra and steroids.  COVID-19 Labs  Recent Labs    10/27/18 0115 10/28/18 0150 10/29/18 0150  DDIMER 1.51*  --   --   FERRITIN 401* 380*  --   CRP 7.8* 5.8* 6.0*    Lab Results  Component Value Date   SARSCOV2NAA POSITIVE (A) 10/22/2018    Sepsis present on admission -Blood Pressure has been soft initially, she did require brief pressors support Levophed initially, but now pressor requirement since. -Procalcitonin mildly elevated, trending down, treated empiric ceftriaxone and azithromycin.  Leukocytosis Patient noted to have leukocytosis even at admission.  Trending up, but this is most likely in the setting of steroids, procalcitonin is trending down, continue to monitor closely .  Transaminitis -Mildly elevated, trending up, slowly, most likely as she received Actemra, continue to monitor closely   QT prolongation Noted to be 490 ms.  Avoid QT prolonging agents as much as possible.  Hypernatremia -Resolved, she is on free water via PEG  Hypokalemia - repleted.  Patient  started on bowel regimen given constipation  DVT Prophylaxis:  Lovenox 40 mg every 12 hours  Lab Results  Component Value Date   PLT 299 10/24/2018     PUD Prophylaxis: Famotidine Code Status: Full code Family Communication: Updating her daughter daily, will call again today Disposition Plan: Will remain in ICU   Consultants:  Pulmonology  Procedures: Intubation  Antibiotics: Anti-infectives (From admission, onward)   Start     Dose/Rate Route Frequency Ordered Stop   10/27/18 2200  cefTRIAXone (ROCEPHIN) 2 g in sodium chloride 0.9 % 100 mL IVPB     2 g 200 mL/hr over 30 Minutes Intravenous Every 24 hours 10/27/18 0835 10/28/18 2243   10/24/18 1400  remdesivir 100 mg in sodium chloride 0.9 % 250 mL IVPB  Status:  Discontinued     100 mg over 30 Minutes Intravenous Every 24 hours 10/23/18 1153 10/24/18 1335   10/24/18 1400  remdesivir 100 mg in sodium chloride 0.9 % 220 mL IVPB  Status:  Discontinued     100 mg over 30 Minutes Intravenous Every 24 hours 10/24/18 1334 10/24/18 1336   10/24/18 1400  remdesivir 100 mg in sodium chloride 0.9 % 230 mL IVPB     100 mg over 30 Minutes Intravenous Every 24 hours 10/24/18 1335 11/02/18 1359   10/24/18 1400  remdesivir 100 mg in sodium chloride 0.9 % 230 mL IVPB  Status:  Discontinued     100 mg over 30 Minutes Intravenous Every 24 hours 10/24/18 1337 10/24/18 1339   10/23/18 1400  remdesivir 200 mg in sodium chloride 0.9 % 250 mL IVPB     200 mg over 30 Minutes Intravenous Once 10/23/18 1153 10/23/18 1738   10/22/18 2200  azithromycin (ZITHROMAX) 500 mg in sodium chloride 0.9 % 250 mL IVPB     500 mg 250 mL/hr over 60 Minutes Intravenous Every 24 hours 10/22/18 2148 10/26/18 2255   10/22/18 2200  cefTRIAXone (ROCEPHIN) 1 g in sodium chloride 0.9 % 100 mL IVPB  Status:  Discontinued     1 g 200 mL/hr over 30 Minutes Intravenous Every 24 hours 10/22/18 2148 10/27/18 0835      Medications:  Scheduled: . chlorhexidine  15 mL Mouth/Throat BID  . Chlorhexidine Gluconate Cloth  6 each Topical Daily  . enoxaparin (LOVENOX) injection  40 mg Subcutaneous Q12H  . famotidine  20 mg Per Tube Q12H  . feeding supplement (PRO-STAT SUGAR FREE 64)  60 mL Per Tube Daily  . feeding supplement (VITAL HIGH PROTEIN)  1,000 mL Per Tube Q24H  . free water  300 mL Per Tube Q4H  .  mouth rinse  15 mL Mouth Rinse 10 times per day  . multivitamin with minerals  1 tablet Per Tube Daily  . polyethylene glycol  17 g Oral BID  . potassium chloride  20 mEq Oral Once  . sodium chloride flush  10-40 mL Intracatheter Q12H  . sodium chloride flush  3 mL Intravenous Q12H  . vitamin C  500 mg Per Tube Daily  . zinc sulfate  220 mg Per Tube Daily   Continuous: . sodium chloride 10 mL/hr at 10/29/18 0000  . fentaNYL infusion INTRAVENOUS 100 mcg/hr (10/29/18 0000)  . norepinephrine (LEVOPHED) Adult infusion Stopped (10/24/18 2045)  . propofol (DIPRIVAN) infusion 10 mcg/kg/min (10/29/18 0000)  . remdesivir 100 mg in NS 250 mL     UXL:KGMWNUUVOZDGU (TYLENOL) oral liquid 160 mg/5 mL, bisacodyl, docusate, fentaNYL, fentaNYL (SUBLIMAZE) injection, hydrALAZINE, midazolam, polyethylene glycol, sodium chloride flush   Objective:  Vital Signs  Vitals:   10/29/18 0615 10/29/18 0630 10/29/18 0645 10/29/18  0707  BP: 109/67 93/64 (!) 87/54 120/74  Pulse: (!) 102 (!) 101 (!) 108 (!) 116  Resp: (!) 24 (!) 24 19 (!) 26  Temp:      TempSrc:      SpO2: 94% 95% 95% 92%  Weight:      Height:        Intake/Output Summary (Last 24 hours) at 10/29/2018 1041 Last data filed at 10/29/2018 0539 Gross per 24 hour  Intake 1022.05 ml  Output 1350 ml  Net -327.95 ml   Filed Weights   10/28/18 0000 10/28/18 0423 10/29/18 0445  Weight: 91.1 kg 91.1 kg 94 kg   Sedated, intubated, in no apparent distress, in no apparent distress  ET tube, OG tube in place  Vent supported breathing, clear to auscultation bilaterally, no wheezing Good rate and rhythm, no rubs or gallops Abdomen soft, nontender, bowel sounds present Extremities with no edema, clubbing or cyanosis     Lab Results:  Data Reviewed: I have personally reviewed following labs and imaging studies  CBC: Recent Labs  Lab 10/22/18 1611  10/23/18 0652  10/24/18 0920 10/25/18 0705 10/27/18 0115 10/28/18 0150 10/29/18 0150   WBC 16.4*  --  13.5*  --  22.4* 27.7* 31.3* 25.2* 17.9*  NEUTROABS 14.2*  --  11.3*  --  19.9* 24.2*  --   --   --   HGB 13.7   < > 12.0   < > 12.5 11.2* 12.2 12.4 11.3*  HCT 45.5   < > 39.8   < > 40.7 38.0 40.2 40.3 36.5  MCV 91.0  --  90.0  --  91.1 92.7 92.0 91.0 89.0  PLT 256  --  219  --  299 348 443* 449* 392   < > = values in this interval not displayed.    Basic Metabolic Panel: Recent Labs  Lab 10/23/18 0652 10/23/18 1443  10/24/18 0920 10/24/18 1703 10/25/18 0705 10/26/18 0300 10/27/18 0115 10/28/18 0150 10/29/18 0150  NA 138  --    < > 143  --  146* 147* 146* 143 137  K 4.6  --    < > 3.0*  --  4.3 3.8 4.1 3.9 3.3*  CL 105  --   --  110  --  116* 116* 107 100 95*  CO2 21*  --   --  22  --  33* 29  GLUCOSE 103*  --   --  140*  --  124* 149* 140* 104* 102*  BUN 22*  --   --  29*  --  48* 43* 40* 39* 38*  CREATININE 0.80  --   --  0.78  --  0.81 0.65 0.75 0.74 0.63  CALCIUM 8.4*  --   --  8.4*  --  8.3* 8.1* 8.5* 8.7* 7.9*  MG 2.7* 2.4  --  2.4 2.4  --   --   --   --   --   PHOS 2.9 2.6  --  2.8 3.1  --   --   --   --   --    < > = values in this interval not displayed.    GFR: Estimated Creatinine Clearance: 88.3 mL/min (by C-G formula based on SCr of 0.63 mg/dL).  Liver Function Tests: Recent Labs  Lab 10/25/18 0705 10/26/18 0300 10/27/18 0115 10/28/18 0150 10/29/18 0150  AST 47* 49* 62* 75* 44*  ALT 39 43 53* 61* 45*  ALKPHOS 139* 153*  169* 174* 135*  BILITOT <0.1* <0.1* <0.1* 0.3 0.1*  PROT 6.2* 6.2* 6.9 6.7 5.7*  ALBUMIN 2.2* 2.4* 2.7* 2.8* 2.4*    Recent Labs  Lab 10/22/18 1612  LIPASE 26    Cardiac Enzymes: Recent Labs  Lab 10/22/18 1625 10/23/18 0652 10/26/18 1346 10/26/18 1845 10/27/18 0115  TROPONINI 0.04* 0.03* 0.08* 0.05* 0.04*     Lipid Profile: No results for input(s): CHOL, HDL, LDLCALC, TRIG, CHOLHDL, LDLDIRECT in the last 72 hours.  Anemia Panel: Recent Labs    10/27/18 0115 10/28/18 0150  FERRITIN 401*  380*    Recent Results (from the past 240 hour(s))  SARS Coronavirus 2 (Hosp order,Performed in [facility] lab via [employer])     Status: Abnormal   Collection Time: 10/22/18  4:16 PM  Result Value Ref Range Status   SARS Coronavirus 2 ([employer] Now) POSITIVE (A) NEGATIVE Final    Comment: RESULT CALLED TO, READ BACK BY AND VERIFIED WITH: CALLED TO S.GOUGE RN AT 1650 BY SROY ON [id number] (NOTE) Interpretive Result Comment(s): COVID 19 Positive SARS CoV 2 target nucleic acids are DETECTED. The SARS CoV 2 RNA is generally detectable in upper and lower respiratory specimens during the acute phase of infection.  Positive results are indicative of active infection with SARS CoV 2.  Clinical correlation with patient history and other diagnostic information is necessary to determine patient infection status.  Positive results do not rule out bacterial infection or coinfection with other viruses. The expected result is Negative. COVID 19 Negative SARS CoV 2 target nucleic acids are NOT DETECTED. The SARS CoV 2 RNA is generally detectable in upper and lower respiratory specimens during the acute phase of infection.  Negative results do not preclude SARS CoV 2 infection, do not rule out coinfections with other pathogens, and should not be used as the sole basis for  treatment or other patient management decisions.  Negative results must be combined with clinical observations, patient history, and epidemiological information. The expected result is Negative. Invalid Presence or absence of SARS CoV 2 nucleic acids cannot be determined. Repeat testing was performed on the submitted specimen and repeated Invalid results were obtained.  If clinically indicated, additional testing on a new specimen with an alternate test methodology [phone]) is advised.  The SARS CoV 2 RNA is generally detectable in upper and lower respiratory specimens during the acute phase of infection. The expected  result is Negative. Fact Sheet for Patients:  http://www.graves-ford.org/ Fact Sheet for Healthcare Providers: EnviroConcern.si This test is not yet approved or cleared by the [place] FDA and has been authorized for detection and/or diagnosis of SARS CoV 2 by FDA under an Emergency Use Authorization (EUA).   This EUA will remain in effect (meaning this test can be used) for the duration of the COVID19 declaration under Section 564(b)(1) of the Act, 21 U.S.C. section [phone] 3(b)(1), unless the authorization is terminated or revoked sooner. Performed at [facility], [address] [place], [place] [address]   Culture, blood (routine x 2)     Status: None   Collection Time: 10/22/18  9:49 PM  Result Value Ref Range Status   Specimen Description BLOOD LEFT HAND  Final   Special Requests   Final    BOTTLES DRAWN AEROBIC ONLY Blood Culture adequate volume   Culture   Final    NO GROWTH 5 DAYS Performed at [facility], [address] [place], [place] [address]  Report Status 10/28/2018 FINAL  Final  Culture, blood (routine x 2)     Status: Abnormal   Collection Time: 10/22/18  9:54 PM  Result Value Ref Range Status   Specimen Description   Final    BLOOD Performed at [facility], [address], [place], [place] [address]    Special Requests   Final    NONE Performed at [facility], [address], [place], [place] [address]    Culture  Setup Time   Final    GRAM POSITIVE COCCI IN CLUSTERS AEROBIC BOTTLE ONLY CRITICAL RESULT CALLED TO, READ BACK BY AND VERIFIED WITH: N. BATCHELDER,PHARMD 0543 10/24/2018 T. TYSOR    Culture (A)  Final    STAPHYLOCOCCUS SPECIES (COAGULASE NEGATIVE) THE SIGNIFICANCE OF ISOLATING THIS ORGANISM FROM A SINGLE SET OF BLOOD CULTURES WHEN MULTIPLE SETS ARE DRAWN IS UNCERTAIN. PLEASE NOTIFY THE MICROBIOLOGY DEPARTMENT WITHIN ONE WEEK IF SPECIATION  AND SENSITIVITIES ARE REQUIRED. Performed at [facility], [address] [place], [place] [address]    Report Status 10/26/2018 FINAL  Final  Blood Culture ID Panel (Reflexed)     Status: Abnormal   Collection Time: 10/22/18  9:54 PM  Result Value Ref Range Status   Enterococcus species NOT DETECTED NOT DETECTED Final   Listeria monocytogenes NOT DETECTED NOT DETECTED Final   Staphylococcus species DETECTED (A) NOT DETECTED Final    Comment: Methicillin (oxacillin) susceptible coagulase negative staphylococcus. Possible blood culture contaminant (unless isolated from more than one blood culture draw or clinical case suggests pathogenicity). No antibiotic treatment is indicated for blood  culture contaminants. CRITICAL RESULT CALLED TO, READ BACK BY AND VERIFIED WITH: N. BATCHELDER,PHARMD 0543 10/24/2018 T. TYSOR    Staphylococcus aureus (BCID) NOT DETECTED NOT DETECTED Final   Methicillin resistance NOT DETECTED NOT DETECTED Final   Streptococcus species NOT DETECTED NOT DETECTED Final   Streptococcus agalactiae NOT DETECTED NOT DETECTED Final   Streptococcus pneumoniae NOT DETECTED NOT DETECTED Final   Streptococcus pyogenes NOT DETECTED NOT DETECTED Final   Acinetobacter baumannii NOT DETECTED NOT DETECTED Final   Enterobacteriaceae species NOT DETECTED NOT DETECTED Final   Enterobacter cloacae complex NOT DETECTED NOT DETECTED Final   Escherichia coli NOT DETECTED NOT DETECTED Final   Klebsiella oxytoca NOT DETECTED NOT DETECTED Final   Klebsiella pneumoniae NOT DETECTED NOT DETECTED Final   Proteus species NOT DETECTED NOT DETECTED Final   Serratia marcescens NOT DETECTED NOT DETECTED Final   Haemophilus influenzae NOT DETECTED NOT DETECTED Final   Neisseria meningitidis NOT DETECTED NOT DETECTED Final   Pseudomonas aeruginosa NOT DETECTED NOT DETECTED Final   Candida albicans NOT DETECTED NOT DETECTED Final   Candida glabrata NOT DETECTED NOT DETECTED Final   Candida  krusei NOT DETECTED NOT DETECTED Final   Candida parapsilosis NOT DETECTED NOT DETECTED Final   Candida tropicalis NOT DETECTED NOT DETECTED Final    Comment: Performed at [facility], [address] [place], [place] [address]  MRSA PCR Screening     Status: None   Collection Time: 10/25/18  2:25 AM  Result Value Ref Range Status   MRSA by PCR NEGATIVE NEGATIVE Final    Comment:        The GeneXpert MRSA Assay (FDA approved for NASAL specimens only), is one component of a comprehensive MRSA colonization surveillance program. It is not intended to diagnose MRSA infection nor to guide or monitor treatment for MRSA infections. Performed at [facility], [address], [place], [place] [address]       Radiology Studies: Dg Abd 1 View  Result Date: 10/27/2018 CLINICAL DATA:  Feeding tube adjusted EXAM: ABDOMEN - 1 VIEW COMPARISON:  10/27/2018 FINDINGS: Interval repositioning of a non weighted esophagogastric tube, with the tip in the vicinity of the the duodenal bulb and the side-port in the vicinity of the pylorus. Nonobstructive pattern of bowel gas with gas present to the rectum. IMPRESSION: Interval repositioning of a non weighted esophagogastric tube, with the tip in the vicinity of the the duodenal bulb and the side-port in the vicinity of the pylorus. Nonobstructive pattern of bowel gas with gas present to the rectum. Electronically Signed   By: Lauralyn Primes M.D.   On: 10/27/2018 15:22   Dg Abd 1 View  Result Date: 10/27/2018 CLINICAL DATA:  Feeding tube. EXAM: ABDOMEN - 1 VIEW COMPARISON:  Chest radiograph 10/27/2018 at 0444 hours FINDINGS: An enteric tube loops back upon itself in the gastric body and courses superiorly into the esophagus with tip not imaged, however this was included on today's earlier chest radiograph where the tube had a similar configuration and terminated over the lower half of the thoracic esophagus. The side hole is near the GE  junction. Gas is present in nondilated bowel loops without evidence of obstruction. No acute osseous abnormality is seen. IMPRESSION: Enteric tube looped upon itself in the stomach with tip in the esophagus, incompletely imaged. Recommend retracting the tube and reimaging. Electronically Signed   By: Sebastian Ache M.D.   On: 10/27/2018 11:51   Ct Angio Chest Pe W Or Wo Contrast  Result Date: 10/28/2018 CLINICAL DATA:  Increasing hypoxemia EXAM: CT ANGIOGRAPHY CHEST WITH CONTRAST TECHNIQUE: Multidetector CT imaging of the chest was performed using the standard protocol during bolus administration of intravenous contrast. Multiplanar CT image reconstructions and MIPs were obtained to evaluate the vascular anatomy. CONTRAST:  OMNIPAQUE IOHEXOL 350 MG/ML SOLN COMPARISON:  None. FINDINGS: Cardiovascular: Examination of the pulmonary arteries is limited by breath motion artifact. Within this limitation, no evidence of embolism through the proximal segmental pulmonary arterial level. Normal heart size. No pericardial effusion. Mediastinum/Nodes: No enlarged mediastinal, hilar, or axillary lymph nodes. Thyroid gland, trachea, and esophagus demonstrate no significant findings. Lungs/Pleura: Endotracheal tube is positioned with tip above the carina. Diffuse, extensive bilateral ground-glass pulmonary opacity and mild bibasilar consolidation and/or atelectasis. No pleural effusion or pneumothorax. Upper Abdomen: No acute abnormality. Esophagogastric tube with tip and side port below the diaphragm. Musculoskeletal: No chest wall abnormality. No acute or significant osseous findings. Review of the MIP images confirms the above findings. IMPRESSION: 1. Examination of the pulmonary arteries is limited by breath motion artifact. Within this limitation, no evidence of embolism through the proximal segmental pulmonary arterial level. 2. Diffuse, extensive bilateral ground-glass pulmonary opacity and mild bibasilar  consolidation and/or atelectasis, consistent with known diagnosis of COVID-19 infection. 3. Endotracheal tube and esophagogastric tube in satisfactory position. Electronically Signed   By: Lauralyn Primes M.D.   On: 10/28/2018 12:38   Dg Chest Port 1 View  Result Date: 10/28/2018 CLINICAL DATA:  Follow-up PICC line placement EXAM: PORTABLE CHEST 1 VIEW COMPARISON:  Film from earlier in the same day. FINDINGS: Endotracheal tube and gastric catheter are again noted in satisfactory position. Right-sided PICC line is noted with the catheter tip in the mid right atrium stable from prior study. Cardiac shadows within normal limits. Patchy bibasilar infiltrates are seen slightly more prominent than that noted  on the prior exam. Mild central vascular congestion is seen. IMPRESSION: Mild edema and bibasilar infiltrates. Tubes and lines as described above. Electronically Signed   By: Alcide CleverMark  Lukens M.D.   On: 10/28/2018 19:07   Dg Chest Port 1 View  Result Date: 10/28/2018 CLINICAL DATA:  53 year old female with a history of hypoxia EXAM: PORTABLE CHEST 1 VIEW COMPARISON:  Multiple prior most recent 10/27/2018 FINDINGS: Cardiomediastinal silhouette unchanged in size and contour. Low lung volumes with endotracheal tube terminating 2.1 cm above the carina. Repositioning of the gastric tube now terminating in the stomach near the pylorus. Patchy interstitial and airspace opacities bilateral lungs, worst at the left lung base with obscuration of left hemidiaphragm in the left heart border. No pneumothorax. Reticular pattern of opacities bilateral lungs. IMPRESSION: Similar appearance of the chest x-ray with mixed interstitial and airspace opacities, worst at the left lung base. Unchanged endotracheal tube. Gastric tube has been repositioned, now terminating in the stomach, near the pylorus. Electronically Signed   By: Gilmer MorJaime  Wagner D.O.   On: 10/28/2018 11:53   The patient is critically ill with multi-organ failure.   Critical care was necessary to treat or prevent imminent or life-threatening deterioration of respiratory failure, and was exclusive of separately billable procedures and treating other patients. Total critical care time spent by me: 32 minutes Time spent personally by me on obtaining history from patient or surrogate, evaluation of the patient, evaluation of patient's response to treatment, ordering and review of laboratory studies, ordering and review of radiographic studies, ordering and performing treatments and interventions, and re-evaluation of the patient's condition.    LOS: 7 days   Huey Bienenstockawood Dreydon Cardenas MD  [facility] Pager on [email or web address]  10/29/2018, 10:41 AM

## 2018-10-29 NOTE — Progress Notes (Signed)
NAME:  Shannon Meyer, MRN:  [id number], DOB:  08/23/1965, LOS: 7 ADMISSION DATE:  10/22/2018, CONSULTATION DATE: Oct 22, 2018 REFERRING MD: Dr. Orland Dec, CHIEF COMPLAINT: Dyspnea  Brief History   53 year old female with no past medical history admitted from med [facility] on Oct 22, 2018 for acute respiratory failure with hypoxemia secondary to COVID-19 pneumonia  Past Medical History  No known past medical history  Significant Hospital Events   May 20 admission to [facility]  Consults:  Pulmonary critical care  Procedures:  May 20 ET tube May 24 PICC R Arm>   Significant Diagnostic Tests:  May 26 CT angiogram chest showed no pulmonary embolism but there were diffuse bilateral airspace disease in a patchy distribution consistent with atypical pneumonia from COVID-19  Micro Data:  SARS-CoV-2 May 20> positive Blood culture May 20 > 1 of 2 GPC (staph)>>   Antimicrobials/COVID Treatment:  Zithromax 5/20 >> 5/24 Rocephin 5/20 >> Remdesivir 5/21 >  Actemra 5/20> 5/21  Interim history/subjective:   Weaned for 3-1/2 hours this morning on minimal pressure support settings Became tachypneic, hypoxemic when she had to be moved for a bath Now back to full support  Objective   Blood pressure 120/74, pulse (!) 116, temperature 98 F (36.7 C), temperature source Oral, resp. rate (!) 26, height  (1.626 m), weight 94 kg, SpO2 92 %.    Vent Mode: PSV;CPAP FiO2 (%):  [40 %-80 %] 40 % Set Rate:  [24 bmp] 24 bmp Vt Set:  [330 mL] 330 mL PEEP:  [5 cmH20] 5 cmH20 Pressure Support:  [5 cmH20] 5 cmH20 Plateau Pressure:  [19 cmH20-25 cmH20] 19 cmH20   Intake/Output Summary (Last 24 hours) at 10/29/2018 0824 Last data filed at 10/29/2018 0539 Gross per 24 hour  Intake 1078.59 ml  Output 1550 ml  Net -471.41 ml   Filed Weights   10/28/18 0000 10/28/18 0423 10/29/18 0445  Weight: 91.1 kg 91.1 kg 94 kg    Examination:  General:  In bed on vent HENT: NCAT  ETT in place PULM: CTA B, vent supported breathing CV: RRR, no mgr GI: BS+, soft, nontender MSK: normal bulk and tone Neuro: sedated on vent    Oct 27, 2018 chest x-ray images independently reviewed showing essentially no change in mild to moderate bilateral infiltrates, tip of OG tube is back up in esophagus  Resolved Hospital Problem list     Assessment & Plan:  Acute respiratory failure with hypoxemia due to COVID-19: CT angiogram of chest on May 26 showed atypical pneumonia consistent with COVID-19 but no evidence of pulmonary embolism  continue full mechanical ventilatory support, Target tidal volume 6 to 8 cc/kg ideal body weight Continue attempts at pressure support wean as long as tolerated Hopeful for extubation in next 1 to 2 days based on clinical progress Ventilator associated pneumonia prevention protocol Spontaneous breathing trial daily RA SS goal 0 to -1, low-dose fentanyl, propofol infusions for now Continue diuresis with Lasix  Constipation, abdominal distention (contributing to inability to wean?) Continue bowel regimen per hospitalist   Best practice:  Diet: Tube feeding Pain/Anxiety/Delirium protocol (if indicated): Yes, titrated to RASS score of -1, fentanyl infusion, propofol infusion VAP protocol (if indicated): Yes DVT prophylaxis: Lovenox GI prophylaxis: Famotidine Glucose control: Sliding scale insulin Mobility: range of motion movements in bed Code Status: full Family Communication: will update today Disposition: remain in ICU  Labs   CBC: Recent Labs  Lab 10/22/18 1611  10/23/18 0652  10/24/18  0920 10/25/18 0705 10/27/18 0115 10/28/18 0150 10/29/18 0150  WBC 16.4*  --  13.5*  --  22.4* 27.7* 31.3* 25.2* 17.9*  NEUTROABS 14.2*  --  11.3*  --  19.9* 24.2*  --   --   --   HGB 13.7   < > 12.0   < > 12.5 11.2* 12.2 12.4 11.3*  HCT 45.5   < > 39.8   < > 40.7 38.0 40.2 40.3 36.5  MCV 91.0  --  90.0  --  91.1 92.7 92.0 91.0 89.0  PLT 256   --  219  --  299 348 443* 449* 392   < > = values in this interval not displayed.    Basic Metabolic Panel: Recent Labs  Lab 10/23/18 0652 10/23/18 1443  10/24/18 0920 10/24/18 1703 10/25/18 0705 10/26/18 0300 10/27/18 0115 10/28/18 0150 10/29/18 0150  NA 138  --    < > 143  --  146* 147* 146* 143 137  K 4.6  --    < > 3.0*  --  4.3 3.8 4.1 3.9 3.3*  CL 105  --   --  110  --  116* 116* 107 100 95*  CO2 21*  --   --  22  --  22 27 28  33* 29  GLUCOSE 103*  --   --  140*  --  124* 149* 140* 104* 102*  BUN 22*  --   --  29*  --  48* 43* 40* 39* 38*  CREATININE 0.80  --   --  0.78  --  0.81 0.65 0.75 0.74 0.63  CALCIUM 8.4*  --   --  8.4*  --  8.3* 8.1* 8.5* 8.7* 7.9*  MG 2.7* 2.4  --  2.4 2.4  --   --   --   --   --   PHOS 2.9 2.6  --  2.8 3.1  --   --   --   --   --    < > = values in this interval not displayed.   GFR: Estimated Creatinine Clearance: 88.3 mL/min (by C-G formula based on SCr of 0.63 mg/dL). Recent Labs  Lab 10/23/18 0651  10/24/18 0920 10/25/18 0705 10/26/18 0300 10/27/18 0115 10/28/18 0150 10/29/18 0150  PROCALCITON  --   --  0.34 0.32 0.22 0.25  --   --   WBC  --    < > 22.4* 27.7*  --  31.3* 25.2* 17.9*  LATICACIDVEN 1.8  --   --   --   --   --   --   --    < > = values in this interval not displayed.    Liver Function Tests: Recent Labs  Lab 10/25/18 0705 10/26/18 0300 10/27/18 0115 10/28/18 0150 10/29/18 0150  AST 47* 49* 62* 75* 44*  ALT 39 43 53* 61* 45*  ALKPHOS 139* 153* 169* 174* 135*  BILITOT <0.1* <0.1* <0.1* 0.3 0.1*  PROT 6.2* 6.2* 6.9 6.7 5.7*  ALBUMIN 2.2* 2.4* 2.7* 2.8* 2.4*   Recent Labs  Lab 10/22/18 1612  LIPASE 26   No results for input(s): AMMONIA in the last 168 hours.  ABG    Component Value Date/Time   PHART 7.386 10/24/2018 0419   PCO2ART 39.2 10/24/2018 0419   PO2ART 63.0 (L) 10/24/2018 0419   HCO3 23.6 10/24/2018 0419   TCO2 25 10/24/2018 0419   ACIDBASEDEF 1.0 10/24/2018 0419   O2SAT 92.0 10/24/2018  0419  Coagulation Profile: No results for input(s): INR, PROTIME in the last 168 hours.  Cardiac Enzymes: Recent Labs  Lab 10/22/18 1625 10/23/18 0652 10/26/18 1346 10/26/18 1845 10/27/18 0115  TROPONINI 0.04* 0.03* 0.08* 0.05* 0.04*    HbA1C: No results found for: HGBA1C  CBG: Recent Labs  Lab 10/28/18 1258 10/28/18 1612 10/28/18 1927 10/28/18 2351 10/29/18 0448  GLUCAP 128* 144* 122* 108* 81       Critical care time: 35 minutes      Heber [place], MD [place] Pager: [phone] Cell: [phone] If no response, call [phone]

## 2018-10-30 DIAGNOSIS — J1289 Other viral pneumonia: Secondary | ICD-10-CM

## 2018-10-30 DIAGNOSIS — A419 Sepsis, unspecified organism: Secondary | ICD-10-CM

## 2018-10-30 DIAGNOSIS — R6520 Severe sepsis without septic shock: Secondary | ICD-10-CM

## 2018-10-30 DIAGNOSIS — D72829 Elevated white blood cell count, unspecified: Secondary | ICD-10-CM

## 2018-10-30 LAB — COMPREHENSIVE METABOLIC PANEL
ALT: 47 U/L — ABNORMAL HIGH (ref 0–44)
AST: 52 U/L — ABNORMAL HIGH (ref 15–41)
Albumin: 2.5 g/dL — ABNORMAL LOW (ref 3.5–5.0)
Alkaline Phosphatase: 133 U/L — ABNORMAL HIGH (ref 38–126)
Anion gap: 9 (ref 5–15)
BUN: 31 mg/dL — ABNORMAL HIGH (ref 6–20)
CO2: 29 mmol/L (ref 22–32)
Calcium: 8.2 mg/dL — ABNORMAL LOW (ref 8.9–10.3)
Chloride: 100 mmol/L (ref 98–111)
Creatinine, Ser: 0.62 mg/dL (ref 0.44–1.00)
GFR calc Af Amer: >60 mL/min (ref >60)
GFR calc non Af Amer: >60 mL/min (ref >60)
Glucose, Bld: 96 mg/dL (ref 70–99)
Potassium: 4 mmol/L (ref 3.5–5.1)
Sodium: 138 mmol/L (ref 135–145)
Total Bilirubin: 0.3 mg/dL (ref 0.3–1.2)
Total Protein: 5.4 g/dL — ABNORMAL LOW (ref 6.5–8.1)

## 2018-10-30 LAB — D-DIMER, QUANTITATIVE: D-Dimer, Quant: 2.89 ug/mL-FEU — ABNORMAL HIGH (ref 0.00–0.50)

## 2018-10-30 LAB — CBC
HCT: 37.3 % (ref 36.0–46.0)
Hemoglobin: 11.6 g/dL — ABNORMAL LOW (ref 12.0–15.0)
MCH: 28.5 pg (ref 26.0–34.0)
MCHC: 31.1 g/dL (ref 30.0–36.0)
MCV: 91.6 fL (ref 80.0–100.0)
Platelets: 378 10*3/uL (ref 150–400)
RBC: 4.07 MIL/uL (ref 3.87–5.11)
RDW: 16.5 % — ABNORMAL HIGH (ref 11.5–15.5)
WBC: 13.1 10*3/uL — ABNORMAL HIGH (ref 4.0–10.5)
nRBC: 0.2 % (ref 0.0–0.2)

## 2018-10-30 LAB — FERRITIN: Ferritin: 223 ng/mL (ref 11–307)

## 2018-10-30 LAB — GLUCOSE, CAPILLARY
Glucose-Capillary: 34 mg/dL — CL (ref 70–99)
Glucose-Capillary: 101 mg/dL — ABNORMAL HIGH (ref 70–99)

## 2018-10-30 LAB — C-REACTIVE PROTEIN: CRP: 3.9 mg/dL — ABNORMAL HIGH (ref <1.0)

## 2018-10-30 MED ORDER — FUROSEMIDE 10 MG/ML IJ SOLN
40.0000 mg | Freq: Four times a day (QID) | INTRAMUSCULAR | Status: AC
  Administered 2018-10-30 (×3): 40 mg via INTRAVENOUS
  Filled 2018-10-30 (×3): qty 4

## 2018-10-30 MED ORDER — POTASSIUM CHLORIDE 20 MEQ/15ML (10%) PO SOLN
40.0000 meq | Freq: Three times a day (TID) | ORAL | Status: AC
  Administered 2018-10-30 (×2): 40 meq
  Filled 2018-10-30 (×2): qty 30

## 2018-10-30 MED ORDER — METOLAZONE 5 MG PO TABS
10.0000 mg | ORAL_TABLET | Freq: Once | ORAL | Status: AC
  Administered 2018-10-30: 10 mg via ORAL
  Filled 2018-10-30: qty 2

## 2018-10-30 NOTE — Progress Notes (Signed)
   10/30/18 0900  Clinical Encounter Type  Visited With Family  Visit Type Initial;Psychological support;Spiritual support  Referral From Nurse  Consult/Referral To Chaplain  Spiritual Encounters  Spiritual Needs Emotional;Prayer;Other (Comment) (Spiritual Care Conversation/Support)  Stress Factors  Family Stress Factors Health changes;Major life changes   I spoke with the patient's daughter, Shannon Meyer, per referral from patient experience. Shannon Meyer states that she has good support from friends and family. She has been the patient's primary care-giver and finds it hard to decide what information to share with others and what information to "keep to" herself. Shannon Meyer is happy with the care that her mother is receiving. At this time, prayer is her main form of comfort.   Please, contact Spiritual Care for further assistance.   Chaplain Clint Bolder M.Div., [facility]

## 2018-10-30 NOTE — Progress Notes (Signed)
NAME:  Shannon Meyer, MRN:  [id number], DOB:  [birth year], LOS: 8 ADMISSION DATE:  10/22/2018, CONSULTATION DATE: Oct 22, 2018 REFERRING MD: Dr. Orland Dec, CHIEF COMPLAINT: Dyspnea  Brief History   53 year old female with no past medical history admitted from med [facility] on Oct 22, 2018 for acute respiratory failure with hypoxemia secondary to COVID-19 pneumonia  Past Medical History  No known past medical history  Significant Hospital Events   May 20 admission to [facility]  Consults:  Pulmonary critical care  Procedures:  May 20 ET tube>>> May 24 PICC R Arm>>>  Significant Diagnostic Tests:  May 26 CT angiogram chest showed no pulmonary embolism but there were diffuse bilateral airspace disease in a patchy distribution consistent with atypical pneumonia from COVID-19  Micro Data:  SARS-CoV-2 May 20> positive Blood culture May 20 > 1 of 2 GPC (staph)>>   Antimicrobials/COVID Treatment:  Zithromax 5/20 >> 5/24 Rocephin 5/20 >>  Remdesivir 5/21 >   Actemra 5/20> 5/21  Interim history/subjective:  No events overnight, no new complaints, weaning this AM  Objective   Blood pressure 102/60, pulse 84, temperature 98.9 F (37.2 C), temperature source Axillary, resp. rate (!) 21, height 5\' 4"  (1.626 m), weight 94 kg, SpO2 97 %.    Vent Mode: PRVC FiO2 (%):  [40 %-45 %] 40 % Set Rate:  [24 bmp] 24 bmp Vt Set:  [330 mL] 330 mL PEEP:  [5 cmH20] 5 cmH20 Plateau Pressure:  [19 cmH20-25 cmH20] 25 cmH20   Intake/Output Summary (Last 24 hours) at 10/30/2018 0739 Last data filed at 10/30/2018 0600 Gross per 24 hour  Intake 3282.96 ml  Output 1515 ml  Net 1767.96 ml   Filed Weights   10/28/18 0000 10/28/18 0423 10/29/18 0445  Weight: 91.1 kg 91.1 kg 94 kg   Examination:  General:  No events overnight, weaning this AM HENT: [place]/AT, PERRL, EOM-I and MMM, ETT in place PULM: Coarse BS diffusely CV: RRR, Nl S1/S2 and -M/R/G GI: Soft, NT, ND and +BS MSK:  -edema and -tenderness Neuro: sedated on vent  I reviewed CXR myself, ETT is in a good position and infiltrate noted  Resolved Hospital Problem list     Assessment & Plan:  Acute respiratory failure with hypoxemia due to COVID-19: CT angiogram of chest on May 26 showed atypical pneumonia consistent with COVID-19 but no evidence of pulmonary embolism  Maintain on PS as able Active diureses today Titrate FiO2 for sat of 88-92% Ventilator associated pneumonia prevention protocol RASS goal 0 to -1, low-dose fentanyl, propofol infusions for now  Constipation, abdominal distention (contributing to inability to wean?) Continue bowel regimen per hospitalist  Best practice:  Diet: Tube feeding Pain/Anxiety/Delirium protocol (if indicated): Yes, titrated to RASS score of -1, fentanyl infusion, propofol infusion VAP protocol (if indicated): Yes DVT prophylaxis: Lovenox GI prophylaxis: Famotidine Glucose control: Sliding scale insulin Mobility: range of motion movements in bed Code Status: full Family Communication: will update today Disposition: remain in ICU  Labs   CBC: Recent Labs  Lab 10/24/18 0920 10/25/18 0705 10/27/18 0115 10/28/18 0150 10/29/18 0150 10/30/18 0300  WBC 22.4* 27.7* 31.3* 25.2* 17.9* 13.1*  NEUTROABS 19.9* 24.2*  --   --   --   --   HGB 12.5 11.2* 12.2 12.4 11.3* 11.6*  HCT 40.7 38.0 40.2 40.3 36.5 37.3  MCV 91.1 92.7 92.0 91.0 89.0 91.6  PLT 299 348 443* 449* 392 378    Basic Metabolic Panel: Recent Labs  Lab  10/23/18 1443  10/24/18 0920 10/24/18 1703  10/26/18 0300 10/27/18 0115 10/28/18 0150 10/29/18 0150 10/30/18 0300  NA  --    < > 143  --    < > 147* 146* 143 137 138  K  --    < > 3.0*  --    < > 3.8 4.1 3.9 3.3* 4.0  CL  --   --  110  --    < > 116* 107 100 95* 100  CO2  --   --  22  --    < > 27 28 33* 29 29  GLUCOSE  --   --  140*  --    < > 149* 140* 104* 102* 96  BUN  --   --  29*  --    < > 43* 40* 39* 38* 31*  CREATININE  --    --  0.78  --    < > 0.65 0.75 0.74 0.63 0.62  CALCIUM  --   --  8.4*  --    < > 8.1* 8.5* 8.7* 7.9* 8.2*  MG 2.4  --  2.4 2.4  --   --   --   --   --   --   PHOS 2.6  --  2.8 3.1  --   --   --   --   --   --    < > = values in this interval not displayed.   GFR: Estimated Creatinine Clearance: 88.3 mL/min (by C-G formula based on SCr of 0.62 mg/dL). Recent Labs  Lab 10/24/18 0920 10/25/18 0705 10/26/18 0300 10/27/18 0115 10/28/18 0150 10/29/18 0150 10/30/18 0300  PROCALCITON 0.34 0.32 0.22 0.25  --   --   --   WBC 22.4* 27.7*  --  31.3* 25.2* 17.9* 13.1*    Liver Function Tests: Recent Labs  Lab 10/26/18 0300 10/27/18 0115 10/28/18 0150 10/29/18 0150 10/30/18 0300  AST 49* 62* 75* 44* 52*  ALT 43 53* 61* 45* 47*  ALKPHOS 153* 169* 174* 135* 133*  BILITOT <0.1* <0.1* 0.3 0.1* 0.3  PROT 6.2* 6.9 6.7 5.7* 5.4*  ALBUMIN 2.4* 2.7* 2.8* 2.4* 2.5*   No results for input(s): LIPASE, AMYLASE in the last 168 hours. No results for input(s): AMMONIA in the last 168 hours.  ABG    Component Value Date/Time   PHART 7.386 10/24/2018 0419   PCO2ART 39.2 10/24/2018 0419   PO2ART 63.0 (L) 10/24/2018 0419   HCO3 23.6 10/24/2018 0419   TCO2 25 10/24/2018 0419   ACIDBASEDEF 1.0 10/24/2018 0419   O2SAT 92.0 10/24/2018 0419     Coagulation Profile: No results for input(s): INR, PROTIME in the last 168 hours.  Cardiac Enzymes: Recent Labs  Lab 10/26/18 1346 10/26/18 1845 10/27/18 0115  TROPONINI 0.08* 0.05* 0.04*    HbA1C: No results found for: HGBA1C  CBG: Recent Labs  Lab 10/29/18 0448 10/29/18 0831 10/29/18 1255 10/29/18 1556 10/29/18 1944  GLUCAP 81 96 107* 84 88   The patient is critically ill with multiple organ systems failure and requires high complexity decision making for assessment and support, frequent evaluation and titration of therapies, application of advanced monitoring technologies and extensive interpretation of multiple databases.   Critical  Care Time devoted to patient care services described in this note is  32  Minutes. This time reflects time of care of this signee Dr Koren BoundWesam [facility]. This critical care time does not reflect procedure time, or teaching time or  supervisory time of PA/NP/Med student/Med Resident etc but could involve care discussion time.  Rush Farmer, M.D. [facility] Pulmonary/Critical Care Medicine. Pager: [phone]. After hours pager: [phone].

## 2018-10-30 NOTE — Progress Notes (Signed)
PROGRESS NOTE  Shannon Meyer ZOX:[id number] DOB: [birth year] DOA: 10/22/2018 PCP: No primary care provider on file.   LOS: 8 days   Brief Narrative / Interim history: 53 year old female without significant past medical history admitted to the hospital on 10/22/2018 with 5-day history of weakness, cough, shortness of breath, she was found to be profoundly hypoxic in the emergency room and intubated.  She was found to be Covid 19+.  Subjective: -Sedated on the vent.  No significant events overnight.  Assessment & Plan: Principal Problem:   COVID-19 Active Problems:   Leukocytosis   Sepsis (HCC)   Elevated LFTs   QT prolongation   Acute respiratory failure with hypoxia (HCC)   Principal Problem Acute Hypoxic Respiratory Failure due to Covid-19 Viral Illness -Remains on mechanical ventilation, ARDS protocol, critical care following.  Weaning per pulmonary -CT scan on admission without evidence of PE but with significant groundglass opacities bilaterally -Received Actemra 5/20, 5/21 -Status post Remdesivir as well as IV steroids, stopped 5/26 -Diuresis today -Status post treatment with ceftriaxone and azithromycin as well.   Vent Mode: PRVC FiO2 (%):  [40 %-45 %] 40 % Set Rate:  [24 bmp] 24 bmp Vt Set:  [330 mL] 330 mL PEEP:  [5 cmH20] 5 cmH20 Plateau Pressure:  [19 cmH20-25 cmH20] 25 cmH20    COVID-19 Labs  Recent Labs    10/28/18 0150 10/29/18 0150 10/30/18 0300  DDIMER  --   --  2.89*  FERRITIN 380*  --  223  CRP 5.8* 6.0* 3.9*    Lab Results  Component Value Date   SARSCOV2NAA POSITIVE (A) 10/22/2018   Active Problems Sepsis, present on admission -Briefly requiring Levophed but now off of pressors  Leukocytosis -In the setting of sepsis, steroids.  Trending down, white count 13 today.  Afebrile.  Cultures negative other than coag negative staph likely to be contaminant  Transaminitis -Minimally elevated, overall stable  QT prolongation -Noted to be 490  ms.  Avoid QT prolonging agents as much as possible.  Hypernatremia -Resolved, she is on free water via PEG  Hypokalemia -repleted  Scheduled Meds: . bisacodyl  10 mg Oral Once  . chlorhexidine  15 mL Mouth/Throat BID  . Chlorhexidine Gluconate Cloth  6 each Topical Daily  . enoxaparin (LOVENOX) injection  40 mg Subcutaneous Q12H  . famotidine  20 mg Per Tube Q12H  . feeding supplement (PRO-STAT SUGAR FREE 64)  60 mL Per Tube Daily  . feeding supplement (VITAL HIGH PROTEIN)  1,000 mL Per Tube Q24H  . free water  300 mL Per Tube Q4H  . furosemide  40 mg Intravenous Q6H  . mouth rinse  15 mL Mouth Rinse 10 times per day  . multivitamin with minerals  1 tablet Per Tube Daily  . polyethylene glycol  34 g Oral BID  . potassium chloride  40 mEq Per Tube TID  . sodium chloride flush  10-40 mL Intracatheter Q12H  . sodium chloride flush  3 mL Intravenous Q12H  . vitamin C  500 mg Per Tube Daily  . zinc sulfate  220 mg Per Tube Daily   Continuous Infusions: . sodium chloride Stopped (10/30/18 1412)  . fentaNYL infusion INTRAVENOUS 100 mcg/hr (10/30/18 1420)  . norepinephrine (LEVOPHED) Adult infusion 3 mcg/min (10/30/18 1420)  . propofol (DIPRIVAN) infusion 25 mcg/kg/min (10/30/18 1420)  . remdesivir 100 mg in NS 250 mL     PRN Meds:.acetaminophen (TYLENOL) oral liquid 160 mg/5 mL, bisacodyl, docusate, fentaNYL, fentaNYL (SUBLIMAZE) injection, hydrALAZINE, midazolam,  polyethylene glycol, sodium chloride flush  DVT prophylaxis: Lovenox Code Status: Full code Family Communication: none Disposition Plan: TBD  Consultants:   PCCM  Procedures:   5/20 >> ETT  5/24 >> R PICC  Antimicrobials: Zithromax 5/20 >>5/24 Rocephin 5/20 >>  Remdesivir 5/21 >   Actemra 5/20> 5/21   Objective: Vitals:   10/30/18 1139 10/30/18 1200 10/30/18 1300 10/30/18 1400  BP:  106/78 101/65 91/70  Pulse:  (!) 103    Resp: (!) 24 (!) 24 (!) 25 (!) 25  Temp: 98.6 F (37 C)     TempSrc:  Axillary     SpO2:  94%    Weight:      Height:        Intake/Output Summary (Last 24 hours) at 10/30/2018 1448 Last data filed at 10/30/2018 1420 Gross per 24 hour  Intake 3274.29 ml  Output 4415 ml  Net -1140.71 ml   Filed Weights   10/28/18 0000 10/28/18 0423 10/29/18 0445  Weight: 91.1 kg 91.1 kg 94 kg    Examination:  Constitutional: NAD, sedated on vent Eyes: PERRL, lids and conjunctivae normal ENMT: Mucous membranes are moist. Respiratory: clear to auscultation bilaterally, no wheezing, no crackles. Normal respiratory effort. Cardiovascular: Regular rate and rhythm, no murmurs / rubs / gallops. No LE edema.  Abdomen: no tenderness. Bowel sounds positive.  Skin: no rashes Neurologic: Intubated and sedated   Data Reviewed: I have independently reviewed following labs and imaging studies   CBC: Recent Labs  Lab 10/24/18 0920 10/25/18 0705 10/27/18 0115 10/28/18 0150 10/29/18 0150 10/30/18 0300  WBC 22.4* 27.7* 31.3* 25.2* 17.9* 13.1*  NEUTROABS 19.9* 24.2*  --   --   --   --   HGB 12.5 11.2* 12.2 12.4 11.3* 11.6*  HCT 40.7 38.0 40.2 40.3 36.5 37.3  MCV 91.1 92.7 92.0 91.0 89.0 91.6  PLT 299 348 443* 449* 392 378   Basic Metabolic Panel: Recent Labs  Lab 10/24/18 0920 10/24/18 1703  10/26/18 0300 10/27/18 0115 10/28/18 0150 10/29/18 0150 10/30/18 0300  NA 143  --    < > 147* 146* 143 137 138  K 3.0*  --    < > 3.8 4.1 3.9 3.3* 4.0  CL 110  --    < > 116* 107 100 95* 100  CO2 22  --    < > 27 28 33* 29 29  GLUCOSE 140*  --    < > 149* 140* 104* 102* 96  BUN 29*  --    < > 43* 40* 39* 38* 31*  CREATININE 0.78  --    < > 0.65 0.75 0.74 0.63 0.62  CALCIUM 8.4*  --    < > 8.1* 8.5* 8.7* 7.9* 8.2*  MG 2.4 2.4  --   --   --   --   --   --   PHOS 2.8 3.1  --   --   --   --   --   --    < > = values in this interval not displayed.   GFR: Estimated Creatinine Clearance: 88.3 mL/min (by C-G formula based on SCr of 0.62 mg/dL). Liver Function Tests:  Recent Labs  Lab 10/26/18 0300 10/27/18 0115 10/28/18 0150 10/29/18 0150 10/30/18 0300  AST 49* 62* 75* 44* 52*  ALT 43 53* 61* 45* 47*  ALKPHOS 153* 169* 174* 135* 133*  BILITOT <0.1* <0.1* 0.3 0.1* 0.3  PROT 6.2* 6.9 6.7 5.7* 5.4*  ALBUMIN 2.4* 2.7*  2.8* 2.4* 2.5*   No results for input(s): LIPASE, AMYLASE in the last 168 hours. No results for input(s): AMMONIA in the last 168 hours. Coagulation Profile: No results for input(s): INR, PROTIME in the last 168 hours. Cardiac Enzymes: Recent Labs  Lab 10/26/18 1346 10/26/18 1845 10/27/18 0115  TROPONINI 0.08* 0.05* 0.04*   BNP (last 3 results) No results for input(s): PROBNP in the last 8760 hours. HbA1C: No results for input(s): HGBA1C in the last 72 hours. CBG: Recent Labs  Lab 10/29/18 0831 10/29/18 1255 10/29/18 1556 10/29/18 1944 10/30/18 1136  GLUCAP 96 107* 84 88 101*   Lipid Profile: No results for input(s): CHOL, HDL, LDLCALC, TRIG, CHOLHDL, LDLDIRECT in the last 72 hours. Thyroid Function Tests: No results for input(s): TSH, T4TOTAL, FREET4, T3FREE, THYROIDAB in the last 72 hours. Anemia Panel: Recent Labs    10/28/18 0150 10/30/18 0300  FERRITIN 380* 223   Urine analysis:    Component Value Date/Time   COLORURINE AMBER (A) 10/22/2018 1715   APPEARANCEUR CLEAR 10/22/2018 1715   LABSPEC >1.030 (H) 10/22/2018 1715   PHURINE 5.5 10/22/2018 1715   GLUCOSEU NEGATIVE 10/22/2018 1715   HGBUR LARGE (A) 10/22/2018 1715   BILIRUBINUR SMALL (A) 10/22/2018 1715   KETONESUR NEGATIVE 10/22/2018 1715   PROTEINUR 100 (A) 10/22/2018 1715   NITRITE NEGATIVE 10/22/2018 1715   LEUKOCYTESUR NEGATIVE 10/22/2018 1715   Sepsis Labs: Invalid input(s): PROCALCITONIN, LACTICIDVEN  Recent Results (from the past 240 hour(s))  SARS Coronavirus 2 (Hosp order,Performed in [facility] lab via [employer] [facility])     Status: Abnormal   Collection Time: 10/22/18  4:16 PM  Result Value Ref Range Status   SARS Coronavirus 2  ([employer] Now) POSITIVE (A) NEGATIVE Final    Comment: RESULT CALLED TO, READ BACK BY AND VERIFIED WITH: CALLED TO S.GOUGE RN AT 1650 BY SROY ON [id number] (NOTE) Interpretive Result Comment(s): COVID 19 Positive SARS CoV 2 target nucleic acids are DETECTED. The SARS CoV 2 RNA is generally detectable in upper and lower respiratory specimens during the acute phase of infection.  Positive results are indicative of active infection with SARS CoV 2.  Clinical correlation with patient history and other diagnostic information is necessary to determine patient infection status.  Positive results do not rule out bacterial infection or coinfection with other viruses. The expected result is Negative. COVID 19 Negative SARS CoV 2 target nucleic acids are NOT DETECTED. The SARS CoV 2 RNA is generally detectable in upper and lower respiratory specimens during the acute phase of infection.  Negative results do not preclude SARS CoV 2 infection, do not rule out coinfections with other pathogens, and should not be used as the sole basis for  treatment or other patient management decisions.  Negative results must be combined with clinical observations, patient history, and epidemiological information. The expected result is Negative. Invalid Presence or absence of SARS CoV 2 nucleic acids cannot be determined. Repeat testing was performed on the submitted specimen and repeated Invalid results were obtained.  If clinically indicated, additional testing on a new specimen with an alternate test methodology [id number] is advised.  The SARS CoV 2 RNA is generally detectable in upper and lower respiratory specimens during the acute phase of infection. The expected result is Negative. Fact Sheet for Patients:  http://www.graves-ford.org/ Fact Sheet for Healthcare Providers: EnviroConcern.si This test is not yet approved or cleared by the [place] FDA and has  been authorized for detection and/or diagnosis of SARS CoV 2  by FDA under an Emergency Use Authorization (EUA).   This EUA will remain in effect (meaning this test can be used) for the duration of the COVID19 declaration under Section 564(b)(1) of the Act, 21 U.S.C. section [id number] 3(b)(1), unless the authorization is terminated or revoked sooner. Performed at [facility] [place], [address] [place], [place] [address]   Culture, blood (routine x 2)     Status: None   Collection Time: 10/22/18  9:49 PM  Result Value Ref Range Status   Specimen Description BLOOD LEFT HAND  Final   Special Requests   Final    BOTTLES DRAWN AEROBIC ONLY Blood Culture adequate volume   Culture   Final    NO GROWTH 5 DAYS Performed at [facility], [address] [place], [place] [address]    Report Status 10/28/2018 FINAL  Final  Culture, blood (routine x 2)     Status: Abnormal   Collection Time: 10/22/18  9:54 PM  Result Value Ref Range Status   Specimen Description   Final    BLOOD Performed at [facility], [address] [place], [place] [address]    Special Requests   Final    NONE Performed at [facility], [address] [place], [place] [address]    Culture  Setup Time   Final    GRAM POSITIVE COCCI IN CLUSTERS AEROBIC BOTTLE ONLY CRITICAL RESULT CALLED TO, READ BACK BY AND VERIFIED WITH: N. BATCHELDER,PHARMD 0543 10/24/2018 T. TYSOR    Culture (A)  Final    STAPHYLOCOCCUS SPECIES (COAGULASE NEGATIVE) THE SIGNIFICANCE OF ISOLATING THIS ORGANISM FROM A SINGLE SET OF BLOOD CULTURES WHEN MULTIPLE SETS ARE DRAWN IS UNCERTAIN. PLEASE NOTIFY THE MICROBIOLOGY DEPARTMENT WITHIN ONE WEEK IF SPECIATION AND SENSITIVITIES ARE REQUIRED. Performed at [facility], [address] [place], [place] [address]    Report Status 10/26/2018 FINAL  Final  Blood Culture ID Panel (Reflexed)     Status: Abnormal   Collection Time: 10/22/18  9:54 PM   Result Value Ref Range Status   Enterococcus species NOT DETECTED NOT DETECTED Final   Listeria monocytogenes NOT DETECTED NOT DETECTED Final   Staphylococcus species DETECTED (A) NOT DETECTED Final    Comment: Methicillin (oxacillin) susceptible coagulase negative staphylococcus. Possible blood culture contaminant (unless isolated from more than one blood culture draw or clinical case suggests pathogenicity). No antibiotic treatment is indicated for blood  culture contaminants. CRITICAL RESULT CALLED TO, READ BACK BY AND VERIFIED WITH: N. BATCHELDER,PHARMD 0543 10/24/2018 T. TYSOR    Staphylococcus aureus (BCID) NOT DETECTED NOT DETECTED Final   Methicillin resistance NOT DETECTED NOT DETECTED Final   Streptococcus species NOT DETECTED NOT DETECTED Final   Streptococcus agalactiae NOT DETECTED NOT DETECTED Final   Streptococcus pneumoniae NOT DETECTED NOT DETECTED Final   Streptococcus pyogenes NOT DETECTED NOT DETECTED Final   Acinetobacter baumannii NOT DETECTED NOT DETECTED Final   Enterobacteriaceae species NOT DETECTED NOT DETECTED Final   Enterobacter cloacae complex NOT DETECTED NOT DETECTED Final   Escherichia coli NOT DETECTED NOT DETECTED Final   Klebsiella oxytoca NOT DETECTED NOT DETECTED Final   Klebsiella pneumoniae NOT DETECTED NOT DETECTED Final   Proteus species NOT DETECTED NOT DETECTED Final   Serratia marcescens NOT DETECTED NOT DETECTED Final   Haemophilus influenzae NOT DETECTED NOT DETECTED Final   Neisseria meningitidis NOT DETECTED NOT DETECTED Final   Pseudomonas aeruginosa NOT DETECTED NOT DETECTED Final   Candida  albicans NOT DETECTED NOT DETECTED Final   Candida glabrata NOT DETECTED NOT DETECTED Final   Candida krusei NOT DETECTED NOT DETECTED Final   Candida parapsilosis NOT DETECTED NOT DETECTED Final   Candida tropicalis NOT DETECTED NOT DETECTED Final    Comment: Performed at [facility], [address], [place], [place] [address]  MRSA PCR  Screening     Status: None   Collection Time: 10/25/18  2:25 AM  Result Value Ref Range Status   MRSA by PCR NEGATIVE NEGATIVE Final    Comment:        The GeneXpert MRSA Assay (FDA approved for NASAL specimens only), is one component of a comprehensive MRSA colonization surveillance program. It is not intended to diagnose MRSA infection nor to guide or monitor treatment for MRSA infections. Performed at [facility], [address], [place], [place] [address]       Radiology Studies: Dg Chest Port 1 View  Result Date: 10/28/2018 CLINICAL DATA:  Follow-up PICC line placement EXAM: PORTABLE CHEST 1 VIEW COMPARISON:  Film from earlier in the same day. FINDINGS: Endotracheal tube and gastric catheter are again noted in satisfactory position. Right-sided PICC line is noted with the catheter tip in the mid right atrium stable from prior study. Cardiac shadows within normal limits. Patchy bibasilar infiltrates are seen slightly more prominent than that noted on the prior exam. Mild central vascular congestion is seen. IMPRESSION: Mild edema and bibasilar infiltrates. Tubes and lines as described above. Electronically Signed   By: Alcide Clever M.D.   On: 10/28/2018 19:07   Pamella Pert, MD, PhD [facility]  Contact via  [email or web address]  [facility] Office Info P: [phone]  F: [phone]

## 2018-10-30 NOTE — Progress Notes (Signed)
Spoke with patient's daughter at 30.42 and gave update. Spent time discussing patient's night/day and current status. Time allowed for questions/concerns. Encouraged daughter to call again later today if she wanted another update.  - Alwyn Ren RN

## 2018-10-31 ENCOUNTER — Inpatient Hospital Stay (HOSPITAL_COMMUNITY): Payer: HRSA Program

## 2018-10-31 DIAGNOSIS — G9340 Encephalopathy, unspecified: Secondary | ICD-10-CM

## 2018-10-31 LAB — CBC
HCT: 38.2 % (ref 36.0–46.0)
Hemoglobin: 12.1 g/dL (ref 12.0–15.0)
MCH: 28.9 pg (ref 26.0–34.0)
MCHC: 31.7 g/dL (ref 30.0–36.0)
MCV: 91.4 fL (ref 80.0–100.0)
Platelets: 346 10*3/uL (ref 150–400)
RBC: 4.18 MIL/uL (ref 3.87–5.11)
RDW: 16.4 % — ABNORMAL HIGH (ref 11.5–15.5)
WBC: 10.6 10*3/uL — ABNORMAL HIGH (ref 4.0–10.5)
nRBC: 0.4 % — ABNORMAL HIGH (ref 0.0–0.2)

## 2018-10-31 LAB — GLUCOSE, CAPILLARY
Glucose-Capillary: 124 mg/dL — ABNORMAL HIGH (ref 70–99)
Glucose-Capillary: 131 mg/dL — ABNORMAL HIGH (ref 70–99)
Glucose-Capillary: 99 mg/dL (ref 70–99)

## 2018-10-31 LAB — COMPREHENSIVE METABOLIC PANEL
ALT: 41 U/L (ref 0–44)
AST: 54 U/L — ABNORMAL HIGH (ref 15–41)
Albumin: 2.6 g/dL — ABNORMAL LOW (ref 3.5–5.0)
Alkaline Phosphatase: 128 U/L — ABNORMAL HIGH (ref 38–126)
Anion gap: 13 (ref 5–15)
BUN: 37 mg/dL — ABNORMAL HIGH (ref 6–20)
CO2: 30 mmol/L (ref 22–32)
Calcium: 8 mg/dL — ABNORMAL LOW (ref 8.9–10.3)
Chloride: 89 mmol/L — ABNORMAL LOW (ref 98–111)
Creatinine, Ser: 0.79 mg/dL (ref 0.44–1.00)
GFR calc Af Amer: >60 mL/min (ref >60)
GFR calc non Af Amer: >60 mL/min (ref >60)
Glucose, Bld: 106 mg/dL — ABNORMAL HIGH (ref 70–99)
Potassium: 3.2 mmol/L — ABNORMAL LOW (ref 3.5–5.1)
Sodium: 132 mmol/L — ABNORMAL LOW (ref 135–145)
Total Bilirubin: 0.5 mg/dL (ref 0.3–1.2)
Total Protein: 5.4 g/dL — ABNORMAL LOW (ref 6.5–8.1)

## 2018-10-31 LAB — POCT I-STAT 7, (LYTES, BLD GAS, ICA,H+H)
Acid-Base Excess: 10 mmol/L — ABNORMAL HIGH (ref 0.0–2.0)
Bicarbonate: 35.2 mmol/L — ABNORMAL HIGH (ref 20.0–28.0)
Calcium, Ion: 1.15 mmol/L (ref 1.15–1.40)
HCT: 39 % (ref 36.0–46.0)
Hemoglobin: 13.3 g/dL (ref 12.0–15.0)
O2 Saturation: 87 %
Patient temperature: 98.5
Potassium: 3.2 mmol/L — ABNORMAL LOW (ref 3.5–5.1)
Sodium: 132 mmol/L — ABNORMAL LOW (ref 135–145)
TCO2: 37 mmol/L — ABNORMAL HIGH (ref 22–32)
pCO2 arterial: 49.9 mmHg — ABNORMAL HIGH (ref 32.0–48.0)
pH, Arterial: 7.457 — ABNORMAL HIGH (ref 7.350–7.450)
pO2, Arterial: 52 mmHg — ABNORMAL LOW (ref 83.0–108.0)

## 2018-10-31 LAB — PHOSPHORUS: Phosphorus: 4 mg/dL (ref 2.5–4.6)

## 2018-10-31 LAB — MAGNESIUM: Magnesium: 2 mg/dL (ref 1.7–2.4)

## 2018-10-31 MED ORDER — FREE WATER
200.0000 mL | Status: DC
  Administered 2018-10-31 – 2018-11-01 (×5): 200 mL

## 2018-10-31 MED ORDER — DEXMEDETOMIDINE HCL IN NACL 400 MCG/100ML IV SOLN
0.2000 ug/kg/h | INTRAVENOUS | Status: DC
  Administered 2018-10-31 – 2018-11-02 (×6): 0.4 ug/kg/h via INTRAVENOUS
  Administered 2018-11-02: 0.6 ug/kg/h via INTRAVENOUS
  Administered 2018-11-03 (×3): 0.4 ug/kg/h via INTRAVENOUS
  Administered 2018-11-04: 18:00:00 0.2 ug/kg/h via INTRAVENOUS
  Administered 2018-11-05: 05:00:00 0.4 ug/kg/h via INTRAVENOUS
  Administered 2018-11-05 – 2018-11-06 (×3): 0.8 ug/kg/h via INTRAVENOUS
  Administered 2018-11-06: 15:00:00 1.2 ug/kg/h via INTRAVENOUS
  Administered 2018-11-06: 01:00:00 0.7 ug/kg/h via INTRAVENOUS
  Administered 2018-11-06: 10:00:00 0.2 ug/kg/h via INTRAVENOUS
  Administered 2018-11-07: 1 ug/kg/h via INTRAVENOUS
  Administered 2018-11-07: 17:00:00 0.7 ug/kg/h via INTRAVENOUS
  Administered 2018-11-07: 12:00:00 0.9 ug/kg/h via INTRAVENOUS
  Administered 2018-11-07: 01:00:00 0.8 ug/kg/h via INTRAVENOUS
  Administered 2018-11-08: 12:00:00 0.906 ug/kg/h via INTRAVENOUS
  Administered 2018-11-08: 02:00:00 0.9 ug/kg/h via INTRAVENOUS
  Administered 2018-11-08: 21:00:00 0.906 ug/kg/h via INTRAVENOUS
  Administered 2018-11-08: 07:00:00 0.9 ug/kg/h via INTRAVENOUS
  Administered 2018-11-09: 1.2 ug/kg/h via INTRAVENOUS
  Administered 2018-11-09: 1 ug/kg/h via INTRAVENOUS
  Administered 2018-11-09: 0.9 ug/kg/h via INTRAVENOUS
  Administered 2018-11-09: 11:00:00 1 ug/kg/h via INTRAVENOUS
  Administered 2018-11-09: 1.2 ug/kg/h via INTRAVENOUS
  Administered 2018-11-10: 0.4 ug/kg/h via INTRAVENOUS
  Administered 2018-11-10: 0.9 ug/kg/h via INTRAVENOUS
  Administered 2018-11-10: 1 ug/kg/h via INTRAVENOUS
  Administered 2018-11-11: 0.6 ug/kg/h via INTRAVENOUS
  Administered 2018-11-11 (×2): 0.7 ug/kg/h via INTRAVENOUS
  Filled 2018-10-31 (×39): qty 100

## 2018-10-31 MED ORDER — FUROSEMIDE 10 MG/ML IJ SOLN
40.0000 mg | Freq: Four times a day (QID) | INTRAMUSCULAR | Status: AC
  Administered 2018-10-31 (×3): 40 mg via INTRAVENOUS
  Filled 2018-10-31 (×3): qty 4

## 2018-10-31 MED ORDER — POTASSIUM CHLORIDE 20 MEQ/15ML (10%) PO SOLN
40.0000 meq | Freq: Three times a day (TID) | ORAL | Status: AC
  Administered 2018-10-31 (×2): 40 meq
  Filled 2018-10-31 (×2): qty 30

## 2018-10-31 NOTE — Progress Notes (Signed)
PROGRESS NOTE  Shannon BradfordDeborah Meyer XBJ:[id number] DOB: 03/29/1966 DOA: 10/22/2018 PCP: No primary care provider on file.   LOS: 9 days   Brief Narrative / Interim history: 53 year old female without significant past medical history admitted to the hospital on 10/22/2018 with 5-day history of weakness, cough, shortness of breath, she was found to be profoundly hypoxic in the emergency room and intubated.  She was found to be Covid 19+.  Subjective: -Sedated on the vent, no significant events overnight  Assessment & Plan: Principal Problem:   COVID-19 Active Problems:   Leukocytosis   Sepsis (HCC)   Elevated LFTs   QT prolongation   Acute respiratory failure with hypoxia (HCC)   Principal Problem Acute Hypoxic Respiratory Failure due to Covid-19 Viral Illness -Remains on mechanical ventilation, ARDS protocol, critical care following.  Remains vent dependent -CT scan on admission without evidence of PE but with significant groundglass opacities bilaterally -Received Actemra 5/20, 5/21 -Status post Remdesivir as well as IV steroids, stopped 5/26 -Continue diuresis, excellent urine output over the last 24 hours, renal function stable -Status post treatment with ceftriaxone and azithromycin as well.  Vent Mode: PRVC FiO2 (%):  [40 %-80 %] 80 % Set Rate:  [24 bmp] 24 bmp Vt Set:  [330 mL] 330 mL PEEP:  [5 cmH20] 5 cmH20 Plateau Pressure:  [16 cmH20-24 cmH20] 24 cmH20  COVID-19 Labs  Recent Labs    10/29/18 0150 10/30/18 0300  DDIMER  --  2.89*  FERRITIN  --  223  CRP 6.0* 3.9*    Lab Results  Component Value Date   SARSCOV2NAA POSITIVE (A) 10/22/2018   Active Problems Sepsis, present on admission -Briefly requiring Levophed but now off of pressors  Leukocytosis -In the setting of sepsis, steroids.  Trending down, white count 10.6 today, remains afebrile.  Cultures negative other than coag negative staph likely to be contaminant  Transaminitis -Minimally elevated,  overall improvement  QT prolongation -Noted to be 490 ms.  Avoid QT prolonging agents as much as possible.  Hypernatremia -Resolved, she is on free water via PEG, decrease free water today due to hyponatremia  Hypokalemia -repleted  Scheduled Meds: . bisacodyl  10 mg Oral Once  . chlorhexidine  15 mL Mouth/Throat BID  . Chlorhexidine Gluconate Cloth  6 each Topical Daily  . enoxaparin (LOVENOX) injection  40 mg Subcutaneous Q12H  . famotidine  20 mg Per Tube Q12H  . feeding supplement (PRO-STAT SUGAR FREE 64)  60 mL Per Tube Daily  . feeding supplement (VITAL HIGH PROTEIN)  1,000 mL Per Tube Q24H  . free water  200 mL Per Tube Q4H  . furosemide  40 mg Intravenous Q6H  . mouth rinse  15 mL Mouth Rinse 10 times per day  . multivitamin with minerals  1 tablet Per Tube Daily  . potassium chloride  40 mEq Per Tube TID  . sodium chloride flush  10-40 mL Intracatheter Q12H  . sodium chloride flush  3 mL Intravenous Q12H  . vitamin C  500 mg Per Tube Daily  . zinc sulfate  220 mg Per Tube Daily   Continuous Infusions: . sodium chloride Stopped (10/30/18 1412)  . dexmedetomidine (PRECEDEX) IV infusion 0.4 mcg/kg/hr (10/31/18 1100)  . fentaNYL infusion INTRAVENOUS 100 mcg/hr (10/31/18 1100)  . norepinephrine (LEVOPHED) Adult infusion 4.5 mcg/min (10/31/18 1100)  . remdesivir 100 mg in NS 250 mL Stopped (10/30/18 1749)   PRN Meds:.acetaminophen (TYLENOL) oral liquid 160 mg/5 mL, bisacodyl, docusate, fentaNYL, fentaNYL (SUBLIMAZE) injection, hydrALAZINE, midazolam,  polyethylene glycol, sodium chloride flush  DVT prophylaxis: Lovenox Code Status: Full code Family Communication: Discussed with daughter over the phone Disposition Plan: TBD  Consultants:   PCCM  Procedures:   5/20 >> ETT  5/24 >> R PICC  Antimicrobials: Zithromax 5/20 >>5/24 Rocephin 5/20 >>  Remdesivir 5/21 >   Actemra 5/20> 5/21   Objective: Vitals:   10/31/18 0900 10/31/18 0930 10/31/18 1000  10/31/18 1100  BP: (!) 186/97  93/70 102/71  Pulse: 67 (!) 103 85 75  Resp: 19 16 (!) 24 (!) 24  Temp:      TempSrc:      SpO2: (!) 87% 94% 99% 98%  Weight:      Height:        Intake/Output Summary (Last 24 hours) at 10/31/2018 1140 Last data filed at 10/31/2018 1100 Gross per 24 hour  Intake 2148.67 ml  Output 6950 ml  Net -4801.33 ml   Filed Weights   10/28/18 0000 10/28/18 0423 10/29/18 0445  Weight: 91.1 kg 91.1 kg 94 kg    Examination:  Constitutional: No distress, sedated on vent Eyes: No scleral icterus ENMT: Moist membranes Respiratory: Diminished breath sounds at the bases, no wheezing is heard Cardiovascular: Regular rate and rhythm, no murmurs.  No peripheral edema Abdomen: Soft, bowel sounds positive Skin: No rashes seen Neurologic: Intubated and sedated   Data Reviewed: I have independently reviewed following labs and imaging studies   CBC: Recent Labs  Lab 10/25/18 0705 10/27/18 0115 10/28/18 0150 10/29/18 0150 10/30/18 0300 10/31/18 0330 10/31/18 0405  WBC 27.7* 31.3* 25.2* 17.9* 13.1* 10.6*  --   NEUTROABS 24.2*  --   --   --   --   --   --   HGB 11.2* 12.2 12.4 11.3* 11.6* 12.1 13.3  HCT 38.0 40.2 40.3 36.5 37.3 38.2 39.0  MCV 92.7 92.0 91.0 89.0 91.6 91.4  --   PLT 348 443* 449* 392 378 346  --    Basic Metabolic Panel: Recent Labs  Lab 10/24/18 1703  10/27/18 0115 10/28/18 0150 10/29/18 0150 10/30/18 0300 10/31/18 0330 10/31/18 0405  NA  --    < > 146* 143 137 138 132* 132*  K  --    < > 4.1 3.9 3.3* 4.0 3.2* 3.2*  CL  --    < > 107 100 95* 100 89*  --   CO2  --    < > 28 33* --   GLUCOSE  --    < > 140* 104* 102* 96 106*  --   BUN  --    < > 40* 39* 38* 31* 37*  --   CREATININE  --    < > 0.75 0.74 0.63 0.62 0.79  --   CALCIUM  --    < > 8.5* 8.7* 7.9* 8.2* 8.0*  --   MG 2.4  --   --   --   --   --  2.0  --   PHOS 3.1  --   --   --   --   --  4.0  --    < > = values in this interval not displayed.   GFR:  Estimated Creatinine Clearance: 88.3 mL/min (by C-G formula based on SCr of 0.79 mg/dL). Liver Function Tests: Recent Labs  Lab 10/27/18 0115 10/28/18 0150 10/29/18 0150 10/30/18 0300 10/31/18 0330  AST 62* 75* 44* 52* 54*  ALT 53* 61* 45* 47* 41  ALKPHOS  169* 174* 135* 133* 128*  BILITOT <0.1* 0.3 0.1* 0.3 0.5  PROT 6.9 6.7 5.7* 5.4* 5.4*  ALBUMIN 2.7* 2.8* 2.4* 2.5* 2.6*   No results for input(s): LIPASE, AMYLASE in the last 168 hours. No results for input(s): AMMONIA in the last 168 hours. Coagulation Profile: No results for input(s): INR, PROTIME in the last 168 hours. Cardiac Enzymes: Recent Labs  Lab 10/26/18 1346 10/26/18 1845 10/27/18 0115  TROPONINI 0.08* 0.05* 0.04*   BNP (last 3 results) No results for input(s): PROBNP in the last 8760 hours. HbA1C: No results for input(s): HGBA1C in the last 72 hours. CBG: Recent Labs  Lab 10/29/18 1255 10/29/18 1556 10/29/18 1944 10/30/18 1136 10/30/18 1559  GLUCAP 107* 84 88 101* 99   Lipid Profile: No results for input(s): CHOL, HDL, LDLCALC, TRIG, CHOLHDL, LDLDIRECT in the last 72 hours. Thyroid Function Tests: No results for input(s): TSH, T4TOTAL, FREET4, T3FREE, THYROIDAB in the last 72 hours. Anemia Panel: Recent Labs    10/30/18 0300  FERRITIN 223   Urine analysis:    Component Value Date/Time   COLORURINE AMBER (A) 10/22/2018 1715   APPEARANCEUR CLEAR 10/22/2018 1715   LABSPEC >1.030 (H) 10/22/2018 1715   PHURINE 5.5 10/22/2018 1715   GLUCOSEU NEGATIVE 10/22/2018 1715   HGBUR LARGE (A) 10/22/2018 1715   BILIRUBINUR SMALL (A) 10/22/2018 1715   KETONESUR NEGATIVE 10/22/2018 1715   PROTEINUR 100 (A) 10/22/2018 1715   NITRITE NEGATIVE 10/22/2018 1715   LEUKOCYTESUR NEGATIVE 10/22/2018 1715   Sepsis Labs: Invalid input(s): PROCALCITONIN, LACTICIDVEN  Recent Results (from the past 240 hour(s))  SARS Coronavirus 2 (Hosp order,Performed in [facility] lab via [employer] ID)     Status: Abnormal    Collection Time: 10/22/18  4:16 PM  Result Value Ref Range Status   SARS Coronavirus 2 ([employer] ID Now) POSITIVE (A) NEGATIVE Final    Comment: RESULT CALLED TO, READ BACK BY AND VERIFIED WITH: CALLED TO S.GOUGE RN AT 1650 BY SROY ON [id number] (NOTE) Interpretive Result Comment(s): COVID 19 Positive SARS CoV 2 target nucleic acids are DETECTED. The SARS CoV 2 RNA is generally detectable in upper and lower respiratory specimens during the acute phase of infection.  Positive results are indicative of active infection with SARS CoV 2.  Clinical correlation with patient history and other diagnostic information is necessary to determine patient infection status.  Positive results do not rule out bacterial infection or coinfection with other viruses. The expected result is Negative. COVID 19 Negative SARS CoV 2 target nucleic acids are NOT DETECTED. The SARS CoV 2 RNA is generally detectable in upper and lower respiratory specimens during the acute phase of infection.  Negative results do not preclude SARS CoV 2 infection, do not rule out coinfections with other pathogens, and should not be used as the sole basis for  treatment or other patient management decisions.  Negative results must be combined with clinical observations, patient history, and epidemiological information. The expected result is Negative. Invalid Presence or absence of SARS CoV 2 nucleic acids cannot be determined. Repeat testing was performed on the submitted specimen and repeated Invalid results were obtained.  If clinically indicated, additional testing on a new specimen with an alternate test methodology [phone]) is advised.  The SARS CoV 2 RNA is generally detectable in upper and lower respiratory specimens during the acute phase of infection. The expected result is Negative. Fact Sheet for Patients:  http://www.graves-ford.org/ Fact Sheet for Healthcare Providers:  EnviroConcern.si This test is not yet  approved or cleared by the [place] and has been authorized for detection and/or diagnosis of SARS CoV 2 by FDA under an Emergency Use Authorization (EUA).   This EUA will remain in effect (meaning this test can be used) for the duration of the COVID19 declaration under Section 564(b)(1) of the Act, 21 U.S.C. section [phone] 3(b)(1), unless the authorization is terminated or revoked sooner. Performed at [facility], [address] [place], [place] [address]   Culture, blood (routine x 2)     Status: None   Collection Time: 10/22/18  9:49 PM  Result Value Ref Range Status   Specimen Description BLOOD LEFT HAND  Final   Special Requests   Final    BOTTLES DRAWN AEROBIC ONLY Blood Culture adequate volume   Culture   Final    NO GROWTH 5 DAYS Performed at [facility], [address], [place], [place] [address]    Report Status 10/28/2018 FINAL  Final  Culture, blood (routine x 2)     Status: Abnormal   Collection Time: 10/22/18  9:54 PM  Result Value Ref Range Status   Specimen Description   Final    BLOOD Performed at [facility], [address], [place], [place] [address]    Special Requests   Final    NONE Performed at [facility] [address], [place], [place] [address]    Culture  Setup Time   Final    GRAM POSITIVE COCCI IN CLUSTERS AEROBIC BOTTLE ONLY CRITICAL RESULT CALLED TO, READ BACK BY AND VERIFIED WITH: N. BATCHELDER,PHARMD 0543 10/24/2018 T. TYSOR    Culture (A)  Final    STAPHYLOCOCCUS SPECIES (COAGULASE NEGATIVE) THE SIGNIFICANCE OF ISOLATING THIS ORGANISM FROM A SINGLE SET OF BLOOD CULTURES WHEN MULTIPLE SETS ARE DRAWN IS UNCERTAIN. PLEASE NOTIFY THE MICROBIOLOGY DEPARTMENT WITHIN ONE WEEK IF SPECIATION AND SENSITIVITIES ARE REQUIRED. Performed at [facility], [address], [place], [place] [address]    Report Status  10/26/2018 FINAL  Final  Blood Culture ID Panel (Reflexed)     Status: Abnormal   Collection Time: 10/22/18  9:54 PM  Result Value Ref Range Status   Enterococcus species NOT DETECTED NOT DETECTED Final   Listeria monocytogenes NOT DETECTED NOT DETECTED Final   Staphylococcus species DETECTED (A) NOT DETECTED Final    Comment: Methicillin (oxacillin) susceptible coagulase negative staphylococcus. Possible blood culture contaminant (unless isolated from more than one blood culture draw or clinical case suggests pathogenicity). No antibiotic treatment is indicated for blood  culture contaminants. CRITICAL RESULT CALLED TO, READ BACK BY AND VERIFIED WITH: N. BATCHELDER,PHARMD 0543 10/24/2018 T. TYSOR    Staphylococcus aureus (BCID) NOT DETECTED NOT DETECTED Final   Methicillin resistance NOT DETECTED NOT DETECTED Final   Streptococcus species NOT DETECTED NOT DETECTED Final   Streptococcus agalactiae NOT DETECTED NOT DETECTED Final   Streptococcus pneumoniae NOT DETECTED NOT DETECTED Final   Streptococcus pyogenes NOT DETECTED NOT DETECTED Final   Acinetobacter baumannii NOT DETECTED NOT DETECTED Final   Enterobacteriaceae species NOT DETECTED NOT DETECTED Final   Enterobacter cloacae complex NOT DETECTED NOT DETECTED Final   Escherichia coli NOT DETECTED NOT DETECTED Final   Klebsiella oxytoca NOT DETECTED NOT DETECTED Final   Klebsiella pneumoniae NOT DETECTED NOT DETECTED Final   Proteus species NOT DETECTED NOT DETECTED Final   Serratia marcescens NOT DETECTED NOT DETECTED Final   Haemophilus influenzae NOT DETECTED NOT DETECTED Final  Neisseria meningitidis NOT DETECTED NOT DETECTED Final   Pseudomonas aeruginosa NOT DETECTED NOT DETECTED Final   Candida albicans NOT DETECTED NOT DETECTED Final   Candida glabrata NOT DETECTED NOT DETECTED Final   Candida krusei NOT DETECTED NOT DETECTED Final   Candida parapsilosis NOT DETECTED NOT DETECTED Final   Candida tropicalis NOT DETECTED  NOT DETECTED Final    Comment: Performed at [facility], [address], [place], [place] [address]  MRSA PCR Screening     Status: None   Collection Time: 10/25/18  2:25 AM  Result Value Ref Range Status   MRSA by PCR NEGATIVE NEGATIVE Final    Comment:        The GeneXpert MRSA Assay (FDA approved for NASAL specimens only), is one component of a comprehensive MRSA colonization surveillance program. It is not intended to diagnose MRSA infection nor to guide or monitor treatment for MRSA infections. Performed at [facility], [address] [place], [place] [address]       Radiology Studies: Dg Chest Port 1 View  Result Date: 10/31/2018 CLINICAL DATA:  Respiratory difficulty EXAM: PORTABLE CHEST 1 VIEW COMPARISON:  10/28/2018 FINDINGS: Endotracheal and NG tubes stable. Right upper extremity PICC stable. Normal heart size. Bilateral diffuse airspace disease left greater than right is stable. No pneumothorax. IMPRESSION: Stable bilateral airspace disease left greater than right. Electronically Signed   By: Jolaine Click M.D.   On: 10/31/2018 07:22   Pamella Pert, MD, PhD [facility]  Contact via  [email or web address]  [facility] Office Info P: [phone]  F: [phone]

## 2018-10-31 NOTE — Progress Notes (Signed)
Recruitment maneuver completed for 2 minutes due to sustained desat. PS 25 Peep 5 RR 12 100%  Sats improved to 94%. Patient placed back on previous settings with 80% FIO2.

## 2018-10-31 NOTE — Progress Notes (Signed)
NAME:  Shannon BradfordDeborah Meyer, MRN:  [id number], DOB:  07/21/1965, LOS: 9 ADMISSION DATE:  10/22/2018, CONSULTATION DATE: Oct 22, 2018 REFERRING MD: Dr. Orland DecBarrow, CHIEF COMPLAINT: Dyspnea  Brief History   53 year old female with no past medical history admitted [facility] Oct 22, 2018 for acute respiratory failure with hypoxemia secondary to COVID-19 pneumonia  Past Medical History  No known past medical history  Significant Hospital Events   [facility]  Consults:  Pulmonary critical care  Procedures:  May 20 ET tube>>> May 24 PICC R Arm>>>  Significant Diagnostic Tests:  May 26 CT angiogram chest showed no pulmonary embolism but there were diffuse bilateral airspace disease in a patchy distribution consistent with atypical pneumonia from COVID-19  Micro Data:  SARS-CoV-2 May 20> positive Blood culture May 20 > 1 of 2 GPC (staph)>>   Antimicrobials/COVID Treatment:  Zithromax 5/20 >> 5/24 Rocephin 5/20 >>  Remdesivir 5/21 >   Actemra 5/20> 5/21  Interim history/subjective:  Failed weaning yesterday but diuresed well  Objective   Blood pressure 93/68, pulse (!) 102, temperature 98.5 F (36.9 C), temperature source Axillary, resp. rate (!) 24, height 5\' 4"  (1.626 m), weight 94 kg, SpO2 93 %.    Vent Mode: PRVC FiO2 (%):  [40 %] 40 % Set Rate:  [24 bmp] 24 bmp Vt Set:  [330 mL] 330 mL PEEP:  [5 cmH20] 5 cmH20 Plateau Pressure:  [16 cmH20-24 cmH20] 24 cmH20   Intake/Output Summary (Last 24 hours) at 10/31/2018 0735 Last data filed at 10/31/2018 0600 Gross per 24 hour  Intake 2583.41 ml  Output 7150 ml  Net -4566.59 ml   Filed Weights   10/28/18 0000 10/28/18 0423 10/29/18 0445  Weight: 91.1 kg 91.1 kg 94 kg   Examination:  General:  No events overnight, weaning on PS this AM HENT: [place]/AT, PERRL, EOM-I and MMM, ETT is midline PULM: Decreased BS diffusely CV: RRR, Nl S1/S2 and -M/R/G GI: Soft, NT, ND and +BS MSK:  -edema and -tenderness Neuro: Sedated but withdraws all ext to pain  I reviewed CXR myself, ETT is in a good position, R>L infiltrate noted  Resolved Hospital Problem list     Assessment & Plan:  Acute respiratory failure with hypoxemia due to COVID-19: CT angiogram of chest on May 26 showed atypical pneumonia consistent with COVID-19 but no evidence of pulmonary embolism  Maintain on PS as able Diureses again today Replace K Decrease free water to 200 q4 BMET in AM Titrate FiO2 for sat of 88-92% Ventilator associated pneumonia prevention protocol RASS goal 0 to -1, low-dose fentanyl D/C propofol Precedex if unable to extubate today  Constipation, abdominal distention (contributing to inability to wean?) Continue bowel regiment  Best practice:  Diet: Tube feeding Pain/Anxiety/Delirium protocol (if indicated): Yes, titrated to RASS score of -1, fentanyl infusion, propofol infusion VAP protocol (if indicated): Yes DVT prophylaxis: Lovenox GI prophylaxis: Famotidine Glucose control: Sliding scale insulin Mobility: range of motion movements in bed Code Status: full Family Communication: will update today Disposition: remain in ICU  Labs   CBC: Recent Labs  Lab 10/24/18 0920 10/25/18 0705 10/27/18 0115 10/28/18 0150 10/29/18 0150 10/30/18 0300 10/31/18 0330 10/31/18 0405  WBC 22.4* 27.7* 31.3* 25.2* 17.9* 13.1* 10.6*  --   NEUTROABS 19.9* 24.2*  --   --   --   --   --   --   HGB 12.5 11.2* 12.2 12.4 11.3* 11.6* 12.1 13.3  HCT  40.7 38.0 40.2 40.3 36.5 37.3 38.2 39.0  MCV 91.1 92.7 92.0 91.0 89.0 91.6 91.4  --   PLT 299 348 443* 449* 392 378 346  --    Basic Metabolic Panel: Recent Labs  Lab 10/24/18 0920 10/24/18 1703  10/27/18 0115 10/28/18 0150 10/29/18 0150 10/30/18 0300 10/31/18 0330 10/31/18 0405  NA 143  --    < > 146* 143 137 138 132* 132*  K 3.0*  --    < > 4.1 3.9 3.3* 4.0 3.2* 3.2*  CL 110  --    < > 107 100 95* 100 89*  --   CO2 22  --    < >  28 33* 29 29 30   --   GLUCOSE 140*  --    < > 140* 104* 102* 96 106*  --   BUN 29*  --    < > 40* 39* 38* 31* 37*  --   CREATININE 0.78  --    < > 0.75 0.74 0.63 0.62 0.79  --   CALCIUM 8.4*  --    < > 8.5* 8.7* 7.9* 8.2* 8.0*  --   MG 2.4 2.4  --   --   --   --   --  2.0  --   PHOS 2.8 3.1  --   --   --   --   --  4.0  --    < > = values in this interval not displayed.   GFR: Estimated Creatinine Clearance: 88.3 mL/min (by C-G formula based on SCr of 0.79 mg/dL). Recent Labs  Lab 10/24/18 0920 10/25/18 0705 10/26/18 0300 10/27/18 0115 10/28/18 0150 10/29/18 0150 10/30/18 0300 10/31/18 0330  PROCALCITON 0.34 0.32 0.22 0.25  --   --   --   --   WBC 22.4* 27.7*  --  31.3* 25.2* 17.9* 13.1* 10.6*    Liver Function Tests: Recent Labs  Lab 10/27/18 0115 10/28/18 0150 10/29/18 0150 10/30/18 0300 10/31/18 0330  AST 62* 75* 44* 52* 54*  ALT 53* 61* 45* 47* 41  ALKPHOS 169* 174* 135* 133* 128*  BILITOT <0.1* 0.3 0.1* 0.3 0.5  PROT 6.9 6.7 5.7* 5.4* 5.4*  ALBUMIN 2.7* 2.8* 2.4* 2.5* 2.6*   No results for input(s): LIPASE, AMYLASE in the last 168 hours. No results for input(s): AMMONIA in the last 168 hours.  ABG    Component Value Date/Time   PHART 7.457 (H) 10/31/2018 0405   PCO2ART 49.9 (H) 10/31/2018 0405   PO2ART 52.0 (L) 10/31/2018 0405   HCO3 35.2 (H) 10/31/2018 0405   TCO2 37 (H) 10/31/2018 0405   ACIDBASEDEF 1.0 10/24/2018 0419   O2SAT 87.0 10/31/2018 0405   Coagulation Profile: No results for input(s): INR, PROTIME in the last 168 hours.  Cardiac Enzymes: Recent Labs  Lab 10/26/18 1346 10/26/18 1845 10/27/18 0115  TROPONINI 0.08* 0.05* 0.04*   HbA1C: No results found for: HGBA1C  CBG: Recent Labs  Lab 10/29/18 0831 10/29/18 1255 10/29/18 1556 10/29/18 1944 10/30/18 1136  GLUCAP 96 107* 84 88 101*   The patient is critically ill with multiple organ systems failure and requires high complexity decision making for assessment and support,  frequent evaluation and titration of therapies, application of advanced monitoring technologies and extensive interpretation of multiple databases.   Critical Care Time devoted to patient care services described in this note is  32  Minutes. This time reflects time of care of this signee Dr Koren Bound. This critical  care time does not reflect procedure time, or teaching time or supervisory time of PA/NP/Med student/Med Resident etc but could involve care discussion time.  Rush Farmer, M.D. [facility] Pulmonary/Critical Care Medicine. Pager: [phone]. After hours pager: [phone].

## 2018-10-31 NOTE — Progress Notes (Signed)
Spoke with daughter this morning and updated her on the plan of care for her mother. All questions and concerns were addressed.

## 2018-11-01 ENCOUNTER — Encounter (HOSPITAL_COMMUNITY): Payer: Self-pay | Admitting: *Deleted

## 2018-11-01 LAB — CBC
HCT: 43.3 % (ref 36.0–46.0)
Hemoglobin: 13.6 g/dL (ref 12.0–15.0)
MCH: 28.1 pg (ref 26.0–34.0)
MCHC: 31.4 g/dL (ref 30.0–36.0)
MCV: 89.5 fL (ref 80.0–100.0)
Platelets: 337 10*3/uL (ref 150–400)
RBC: 4.84 MIL/uL (ref 3.87–5.11)
RDW: 16.5 % — ABNORMAL HIGH (ref 11.5–15.5)
WBC: 11.4 10*3/uL — ABNORMAL HIGH (ref 4.0–10.5)
nRBC: 0.3 % — ABNORMAL HIGH (ref 0.0–0.2)

## 2018-11-01 LAB — COMPREHENSIVE METABOLIC PANEL
ALT: 64 U/L — ABNORMAL HIGH (ref 0–44)
AST: 88 U/L — ABNORMAL HIGH (ref 15–41)
Albumin: 2.9 g/dL — ABNORMAL LOW (ref 3.5–5.0)
Alkaline Phosphatase: 150 U/L — ABNORMAL HIGH (ref 38–126)
Anion gap: 16 — ABNORMAL HIGH (ref 5–15)
BUN: 47 mg/dL — ABNORMAL HIGH (ref 6–20)
CO2: 30 mmol/L (ref 22–32)
Calcium: 8.5 mg/dL — ABNORMAL LOW (ref 8.9–10.3)
Chloride: 88 mmol/L — ABNORMAL LOW (ref 98–111)
Creatinine, Ser: 0.81 mg/dL (ref 0.44–1.00)
GFR calc Af Amer: >60 mL/min (ref >60)
GFR calc non Af Amer: >60 mL/min (ref >60)
Glucose, Bld: 93 mg/dL (ref 70–99)
Potassium: 3.4 mmol/L — ABNORMAL LOW (ref 3.5–5.1)
Sodium: 134 mmol/L — ABNORMAL LOW (ref 135–145)
Total Bilirubin: 0.4 mg/dL (ref 0.3–1.2)
Total Protein: 6.1 g/dL — ABNORMAL LOW (ref 6.5–8.1)

## 2018-11-01 LAB — URINALYSIS, ROUTINE W REFLEX MICROSCOPIC
Bilirubin Urine: NEGATIVE
Glucose, UA: NEGATIVE mg/dL
Hgb urine dipstick: NEGATIVE
Ketones, ur: NEGATIVE mg/dL
Leukocytes,Ua: NEGATIVE
Nitrite: NEGATIVE
Protein, ur: NEGATIVE mg/dL
Specific Gravity, Urine: 1.01 (ref 1.005–1.030)
pH: 5 (ref 5.0–8.0)

## 2018-11-01 LAB — TRIGLYCERIDES: Triglycerides: 207 mg/dL — ABNORMAL HIGH (ref <150)

## 2018-11-01 LAB — PHOSPHORUS: Phosphorus: 4.5 mg/dL (ref 2.5–4.6)

## 2018-11-01 LAB — GLUCOSE, CAPILLARY
Glucose-Capillary: 110 mg/dL — ABNORMAL HIGH (ref 70–99)
Glucose-Capillary: 129 mg/dL — ABNORMAL HIGH (ref 70–99)
Glucose-Capillary: 139 mg/dL — ABNORMAL HIGH (ref 70–99)
Glucose-Capillary: 144 mg/dL — ABNORMAL HIGH (ref 70–99)
Glucose-Capillary: 173 mg/dL — ABNORMAL HIGH (ref 70–99)

## 2018-11-01 LAB — MAGNESIUM: Magnesium: 2 mg/dL (ref 1.7–2.4)

## 2018-11-01 LAB — CORTISOL: Cortisol, Plasma: 24.6 ug/dL

## 2018-11-01 MED ORDER — FUROSEMIDE 10 MG/ML IJ SOLN
40.0000 mg | Freq: Four times a day (QID) | INTRAMUSCULAR | Status: AC
  Administered 2018-11-01 (×3): 40 mg via INTRAVENOUS
  Filled 2018-11-01 (×4): qty 4

## 2018-11-01 MED ORDER — OXYCODONE HCL 5 MG/5ML PO SOLN
5.0000 mg | Freq: Three times a day (TID) | ORAL | Status: DC
  Administered 2018-11-01 – 2018-11-05 (×13): 5 mg
  Filled 2018-11-01 (×13): qty 5

## 2018-11-01 MED ORDER — HYDROCORTISONE NA SUCCINATE PF 100 MG IJ SOLR
50.0000 mg | Freq: Two times a day (BID) | INTRAMUSCULAR | Status: DC
  Administered 2018-11-01 – 2018-11-02 (×3): 50 mg via INTRAVENOUS
  Filled 2018-11-01 (×3): qty 2

## 2018-11-01 MED ORDER — FLUCONAZOLE 100MG IVPB
100.0000 mg | INTRAVENOUS | Status: AC
  Administered 2018-11-01 – 2018-11-03 (×3): 100 mg via INTRAVENOUS
  Filled 2018-11-01 (×3): qty 50

## 2018-11-01 MED ORDER — METOLAZONE 5 MG PO TABS
10.0000 mg | ORAL_TABLET | Freq: Once | ORAL | Status: AC
  Administered 2018-11-01: 10 mg via ORAL
  Filled 2018-11-01: qty 2

## 2018-11-01 MED ORDER — POTASSIUM CHLORIDE 20 MEQ/15ML (10%) PO SOLN
40.0000 meq | Freq: Three times a day (TID) | ORAL | Status: AC
  Administered 2018-11-01 (×2): 40 meq
  Filled 2018-11-01 (×2): qty 30

## 2018-11-01 NOTE — Progress Notes (Signed)
NAME:  Shannon Meyer, MRN:  [id number], DOB:  1965-07-11, LOS: 10 ADMISSION DATE:  10/22/2018, CONSULTATION DATE: Oct 22, 2018 REFERRING MD: Dr. Orland Dec, CHIEF COMPLAINT: Dyspnea  Brief History   53 year old female with no past medical history admitted from [facility] on Oct 22, 2018 for acute respiratory failure with hypoxemia secondary to COVID-19 pneumonia  Past Medical History  No known past medical history  Significant Hospital Events   May 20 admission to [facility]  Consults:  Pulmonary critical care  Procedures:  May 20 ET tube>>> May 24 PICC R Arm>>>  Significant Diagnostic Tests:  May 26 CT angiogram chest showed no pulmonary embolism but there were diffuse bilateral airspace disease in a patchy distribution consistent with atypical pneumonia from COVID-19  Micro Data:  SARS-CoV-2 May 20> positive Blood culture May 20 > 1 of 2 GPC (staph)>>   Antimicrobials/COVID Treatment:  Zithromax 5/20 >> 5/24 Rocephin 5/20 >>  Remdesivir 5/21 >   Actemra 5/20> 5/21  Interim history/subjective:  Increased O2 demand overnight Requiring higher PS trials  Objective   Blood pressure 98/60, pulse 85, temperature 98.1 F (36.7 C), temperature source Axillary, resp. rate (!) 24, height 5\' 4"  (1.626 m), weight 90.3 kg, SpO2 93 %.    Vent Mode: PRVC FiO2 (%):  [40 %-80 %] 50 % Set Rate:  [24 bmp] 24 bmp Vt Set:  [330 mL] 330 mL PEEP:  [5 cmH20] 5 cmH20 Plateau Pressure:  [24 cmH20-28 cmH20] 26 cmH20   Intake/Output Summary (Last 24 hours) at 11/01/2018 0734 Last data filed at 11/01/2018 0654 Gross per 24 hour  Intake 3865.33 ml  Output 4085 ml  Net -219.67 ml   Filed Weights   10/28/18 0423 10/29/18 0445 11/01/18 0600  Weight: 91.1 kg 94 kg 90.3 kg   Examination:  General:  Acutely ill appearing female, NAD HENT: [place]/AT, PERRL, EOM-I and -M/R/G, ETT in place PULM: Decreased BS diffusely CV: RRR, Nl S1/S2 and -M/R/G GI: Soft, NT, ND and  +BS MSK: -edema and -tenderness Neuro: Sedated, withdraws all ext to command  I reviewed CXR myself, ETT is in a good position with diffuse infiltrate  Resolved Hospital Problem list     Assessment & Plan:  Acute respiratory failure with hypoxemia due to COVID-19: CT angiogram of chest on May 26 showed atypical pneumonia consistent with COVID-19 but no evidence of pulmonary embolism  Maintain on PS as tolerated Lasix 40 mg IV q6 x3 doses Zaroxolyn 10 mg PO x1 Replace K D/C free water BMET in AM Titrate FiO2 for sat of 88-92% Ventilator associated pneumonia prevention protocol RASS goal 0 to -1, low-dose fentanyl Precedex drip Fentanyl drip  Constipation, abdominal distention (contributing to inability to wean?) Continue bowel regiment  Best practice:  Diet: Tube feeding Pain/Anxiety/Delirium protocol (if indicated): Yes, titrated to RASS score of -1, fentanyl infusion, propofol infusion VAP protocol (if indicated): Yes DVT prophylaxis: Lovenox GI prophylaxis: Famotidine Glucose control: Sliding scale insulin Mobility: range of motion movements in bed Code Status: full Family Communication: will update today Disposition: remain in ICU  Labs   CBC: Recent Labs  Lab 10/28/18 0150 10/29/18 0150 10/30/18 0300 10/31/18 0330 10/31/18 0405 11/01/18 0420  WBC 25.2* 17.9* 13.1* 10.6*  --  11.4*  HGB 12.4 11.3* 11.6* 12.1 13.3 13.6  HCT 40.3 36.5 37.3 38.2 39.0 43.3  MCV 91.0 89.0 91.6 91.4  --  89.5  PLT 449* 392 378 346  --  337   Basic Metabolic  Panel: Recent Labs  Lab 10/27/18 0115 10/28/18 0150 10/29/18 0150 10/30/18 0300 10/31/18 0330 10/31/18 0405  NA 146* 143 137 138 132* 132*  K 4.1 3.9 3.3* 4.0 3.2* 3.2*  CL 107 100 95* 100 89*  --   CO2 28 33* 29 29 30   --   GLUCOSE 140* 104* 102* 96 106*  --   BUN 40* 39* 38* 31* 37*  --   CREATININE 0.75 0.74 0.63 0.62 0.79  --   CALCIUM 8.5* 8.7* 7.9* 8.2* 8.0*  --   MG  --   --   --   --  2.0  --   PHOS  --    --   --   --  4.0  --    GFR: Estimated Creatinine Clearance: 86.4 mL/min (by C-G formula based on SCr of 0.79 mg/dL). Recent Labs  Lab 10/26/18 0300 10/27/18 0115  10/29/18 0150 10/30/18 0300 10/31/18 0330 11/01/18 0420  PROCALCITON 0.22 0.25  --   --   --   --   --   WBC  --  31.3*   < > 17.9* 13.1* 10.6* 11.4*   < > = values in this interval not displayed.    Liver Function Tests: Recent Labs  Lab 10/27/18 0115 10/28/18 0150 10/29/18 0150 10/30/18 0300 10/31/18 0330  AST 62* 75* 44* 52* 54*  ALT 53* 61* 45* 47* 41  ALKPHOS 169* 174* 135* 133* 128*  BILITOT <0.1* 0.3 0.1* 0.3 0.5  PROT 6.9 6.7 5.7* 5.4* 5.4*  ALBUMIN 2.7* 2.8* 2.4* 2.5* 2.6*   No results for input(s): LIPASE, AMYLASE in the last 168 hours. No results for input(s): AMMONIA in the last 168 hours.  ABG    Component Value Date/Time   PHART 7.457 (H) 10/31/2018 0405   PCO2ART 49.9 (H) 10/31/2018 0405   PO2ART 52.0 (L) 10/31/2018 0405   HCO3 35.2 (H) 10/31/2018 0405   TCO2 37 (H) 10/31/2018 0405   ACIDBASEDEF 1.0 10/24/2018 0419   O2SAT 87.0 10/31/2018 0405   Coagulation Profile: No results for input(s): INR, PROTIME in the last 168 hours.  Cardiac Enzymes: Recent Labs  Lab 10/26/18 1346 10/26/18 1845 10/27/18 0115  TROPONINI 0.08* 0.05* 0.04*   HbA1C: No results found for: HGBA1C  CBG: Recent Labs  Lab 10/30/18 1136 10/30/18 1559 10/31/18 2026 10/31/18 2336 11/01/18 0401  GLUCAP 101* 99 131* 124* 110*   The patient is critically ill with multiple organ systems failure and requires high complexity decision making for assessment and support, frequent evaluation and titration of therapies, application of advanced monitoring technologies and extensive interpretation of multiple databases.   Critical Care Time devoted to patient care services described in this note is  33  Minutes. This time reflects time of care of this signee Dr Koren BoundWesam Yacoub. This critical care time does not reflect  procedure time, or teaching time or supervisory time of PA/NP/Med student/Med Resident etc but could involve care discussion time.  Alyson ReedyWesam G. Yacoub, M.D. [facility]. Pager: [phone]. After hours pager: [phone].

## 2018-11-01 NOTE — Progress Notes (Signed)
Called and updated daughter [name] on plan of care and patient status. Answered all questions and concerns. Will continue to monitor.

## 2018-11-01 NOTE — Plan of Care (Signed)

## 2018-11-01 NOTE — Progress Notes (Signed)
Wasted 200 ml of Fentanyl with charge nurse Reinaldo Meeker in to the stericyle. Will continue to monitor.

## 2018-11-01 NOTE — Progress Notes (Signed)
Family updated via telephone, all questions answered.

## 2018-11-01 NOTE — Progress Notes (Signed)
PROGRESS NOTE  Shannon BradfordDeborah Meyer WUJ:[id number] DOB: 10/06/1965 DOA: 10/22/2018 PCP: No primary care provider on file.   LOS: 10 days   Brief Narrative / Interim history: 53 year old female without significant past medical history admitted to the hospital on 10/22/2018 with 5-day history of weakness, cough, shortness of breath, she was found to be profoundly hypoxic in the emergency room and intubated.  She was found to be Covid 19+.  Subjective: -Hypothermic overnight requiring Bair hugger.  Evidence of yeast infection per RN.  Couple of episodes of bradycardia, possibly vagal as 1 of them was associated with inserting a suppository  Assessment & Plan: Principal Problem:   COVID-19 Active Problems:   Leukocytosis   Sepsis (HCC)   Elevated LFTs   QT prolongation   Acute respiratory failure with hypoxia (HCC)   Principal Problem Acute Hypoxic Respiratory Failure due to Covid-19 Viral Illness -Remains on mechanical ventilation, critical care following.  Pressure support and sedation weaning per CCM -CT scan on admission without evidence of PE but with significant groundglass opacities bilaterally -Received Actemra 5/20, 5/21 -Status post Remdesivir as well as IV steroids, stopped 5/26 -Status post treatment with ceftriaxone and azithromycin as well. -Continue diuresis   Vent Mode: PRVC FiO2 (%):  [40 %-60 %] 50 % Set Rate:  [24 bmp] 24 bmp Vt Set:  [330 mL] 330 mL PEEP:  [5 cmH20] 5 cmH20 Pressure Support:  [16 cmH20] 16 cmH20 Plateau Pressure:  [24 cmH20-28 cmH20] 26 cmH20  COVID-19 Labs  Recent Labs    10/30/18 0300  DDIMER 2.89*  FERRITIN 223  CRP 3.9*    Lab Results  Component Value Date   SARSCOV2NAA POSITIVE (A) 10/22/2018   Active Problems Sepsis, present on admission -Resolved  Transaminitis -Minimally elevated, overall improvement  QT prolongation -Noted to be 490 ms.  Avoid QT prolonging agents as much as possible.  Hypernatremia -Stop free water   Hypokalemia -repleted  Scheduled Meds: . bisacodyl  10 mg Oral Once  . chlorhexidine  15 mL Mouth/Throat BID  . Chlorhexidine Gluconate Cloth  6 each Topical Daily  . enoxaparin (LOVENOX) injection  40 mg Subcutaneous Q12H  . famotidine  20 mg Per Tube Q12H  . feeding supplement (PRO-STAT SUGAR FREE 64)  60 mL Per Tube Daily  . feeding supplement (VITAL HIGH PROTEIN)  1,000 mL Per Tube Q24H  . furosemide  40 mg Intravenous Q6H  . hydrocortisone sod succinate (SOLU-CORTEF) inj  50 mg Intravenous Q12H  . mouth rinse  15 mL Mouth Rinse 10 times per day  . metolazone  10 mg Oral Once  . multivitamin with minerals  1 tablet Per Tube Daily  . potassium chloride  40 mEq Per Tube TID  . sodium chloride flush  10-40 mL Intracatheter Q12H  . sodium chloride flush  3 mL Intravenous Q12H  . vitamin C  500 mg Per Tube Daily  . zinc sulfate  220 mg Per Tube Daily   Continuous Infusions: . sodium chloride 10 mL/hr at 11/01/18 0654  . dexmedetomidine (PRECEDEX) IV infusion 0.4 mcg/kg/hr (11/01/18 0654)  . fentaNYL infusion INTRAVENOUS 75 mcg/hr (11/01/18 0654)  . fluconazole (DIFLUCAN) IV 100 mg (11/01/18 0937)  . norepinephrine (LEVOPHED) Adult infusion 3 mcg/min (11/01/18 0705)  . remdesivir 100 mg in NS 250 mL 0 mg (10/30/18 1749)   PRN Meds:.acetaminophen (TYLENOL) oral liquid 160 mg/5 mL, bisacodyl, docusate, fentaNYL, fentaNYL (SUBLIMAZE) injection, hydrALAZINE, midazolam, polyethylene glycol, sodium chloride flush  DVT prophylaxis: Lovenox Code Status: Full code Family  Communication: Discussed with daughter over the phone on 5/29 Disposition Plan: TBD  Consultants:   PCCM  Procedures:   5/20 >> ETT  5/24 >> R PICC  Antimicrobials: Zithromax 5/20 >>5/24 Rocephin 5/20 >>  Remdesivir 5/21 >   Actemra 5/20> 5/21   Objective: Vitals:   11/01/18 0500 11/01/18 0524 11/01/18 0600 11/01/18 0725  BP: (!) 88/69 (!) 87/65 98/60   Pulse: 86 84 85   Resp: (!) 24 (!) 24 (!)  24   Temp:      TempSrc:      SpO2: 93% 93% 93% 94%  Weight:   90.3 kg   Height:        Intake/Output Summary (Last 24 hours) at 11/01/2018 1027 Last data filed at 11/01/2018 0654 Gross per 24 hour  Intake 3560.38 ml  Output 3035 ml  Net 525.38 ml   Filed Weights   10/28/18 0423 10/29/18 0445 11/01/18 0600  Weight: 91.1 kg 94 kg 90.3 kg    Examination:  Constitutional: Sedated on the vent Eyes: No icterus ENMT: Moist membranes Respiratory: Diminished breath sounds at the bases, no wheezing Cardiovascular: Regular rate and rhythm, no edema Abdomen: Soft, bowel sounds positive Skin: No rashes appreciated Neurologic: Intubated and sedated   Data Reviewed: I have independently reviewed following labs and imaging studies   CBC: Recent Labs  Lab 10/28/18 0150 10/29/18 0150 10/30/18 0300 10/31/18 0330 10/31/18 0405 11/01/18 0420  WBC 25.2* 17.9* 13.1* 10.6*  --  11.4*  HGB 12.4 11.3* 11.6* 12.1 13.3 13.6  HCT 40.3 36.5 37.3 38.2 39.0 43.3  MCV 91.0 89.0 91.6 91.4  --  89.5  PLT 449* 392 378 346  --  337   Basic Metabolic Panel: Recent Labs  Lab 10/28/18 0150 10/29/18 0150 10/30/18 0300 10/31/18 0330 10/31/18 0405 11/01/18 0420  NA 143 137 138 132* 132* 134*  K 3.9 3.3* 4.0 3.2* 3.2* 3.4*  CL 100 95* 100 89*  --  88*  CO2 33* 29 29 30   --  30  GLUCOSE 104* 102* 96 106*  --  93  BUN 39* 38* 31* 37*  --  47*  CREATININE 0.74 0.63 0.62 0.79  --  0.81  CALCIUM 8.7* 7.9* 8.2* 8.0*  --  8.5*  MG  --   --   --  2.0  --  2.0  PHOS  --   --   --  4.0  --  4.5   GFR: Estimated Creatinine Clearance: 85.4 mL/min (by C-G formula based on SCr of 0.81 mg/dL). Liver Function Tests: Recent Labs  Lab 10/28/18 0150 10/29/18 0150 10/30/18 0300 10/31/18 0330 11/01/18 0420  AST 75* 44* 52* 54* 88*  ALT 61* 45* 47* 41 64*  ALKPHOS 174* 135* 133* 128* 150*  BILITOT 0.3 0.1* 0.3 0.5 0.4  PROT 6.7 5.7* 5.4* 5.4* 6.1*  ALBUMIN 2.8* 2.4* 2.5* 2.6* 2.9*   No results for  input(s): LIPASE, AMYLASE in the last 168 hours. No results for input(s): AMMONIA in the last 168 hours. Coagulation Profile: No results for input(s): INR, PROTIME in the last 168 hours. Cardiac Enzymes: Recent Labs  Lab 10/26/18 1346 10/26/18 1845 10/27/18 0115  TROPONINI 0.08* 0.05* 0.04*   BNP (last 3 results) No results for input(s): PROBNP in the last 8760 hours. HbA1C: No results for input(s): HGBA1C in the last 72 hours. CBG: Recent Labs  Lab 10/30/18 1559 10/31/18 2026 10/31/18 2336 11/01/18 0401 11/01/18 0748  GLUCAP 99 131* 124* 110* 129*  Lipid Profile: Recent Labs    11/01/18 0420  TRIG 207*   Thyroid Function Tests: No results for input(s): TSH, T4TOTAL, FREET4, T3FREE, THYROIDAB in the last 72 hours. Anemia Panel: Recent Labs    10/30/18 0300  FERRITIN 223   Urine analysis:    Component Value Date/Time   COLORURINE AMBER (A) 10/22/2018 1715   APPEARANCEUR CLEAR 10/22/2018 1715   LABSPEC >1.030 (H) 10/22/2018 1715   PHURINE 5.5 10/22/2018 1715   GLUCOSEU NEGATIVE 10/22/2018 1715   HGBUR LARGE (A) 10/22/2018 1715   BILIRUBINUR SMALL (A) 10/22/2018 1715   KETONESUR NEGATIVE 10/22/2018 1715   PROTEINUR 100 (A) 10/22/2018 1715   NITRITE NEGATIVE 10/22/2018 1715   LEUKOCYTESUR NEGATIVE 10/22/2018 1715   Sepsis Labs: Invalid input(s): PROCALCITONIN, LACTICIDVEN  Recent Results (from the past 240 hour(s))  SARS Coronavirus 2 (Hosp order,Performed in [facility] lab via [employer] ID)     Status: Abnormal   Collection Time: 10/22/18  4:16 PM  Result Value Ref Range Status   SARS Coronavirus 2 ([employer] Now) POSITIVE (A) NEGATIVE Final    Comment: RESULT CALLED TO, READ BACK BY AND VERIFIED WITH: CALLED TO S.GOUGE RN AT 1650 BY SROY ON [id number] (NOTE) Interpretive Result Comment(s): COVID 19 Positive SARS CoV 2 target nucleic acids are DETECTED. The SARS CoV 2 RNA is generally detectable in upper and lower respiratory specimens during the acute  phase of infection.  Positive results are indicative of active infection with SARS CoV 2.  Clinical correlation with patient history and other diagnostic information is necessary to determine patient infection status.  Positive results do not rule out bacterial infection or coinfection with other viruses. The expected result is Negative. COVID 19 Negative SARS CoV 2 target nucleic acids are NOT DETECTED. The SARS CoV 2 RNA is generally detectable in upper and lower respiratory specimens during the acute phase of infection.  Negative results do not preclude SARS CoV 2 infection, do not rule out coinfections with other pathogens, and should not be used as the sole basis for  treatment or other patient management decisions.  Negative results must be combined with clinical observations, patient history, and epidemiological information. The expected result is Negative. Invalid Presence or absence of SARS CoV 2 nucleic acids cannot be determined. Repeat testing was performed on the submitted specimen and repeated Invalid results were obtained.  If clinically indicated, additional testing on a new specimen with an alternate test methodology [phone]) is advised.  The SARS CoV 2 RNA is generally detectable in upper and lower respiratory specimens during the acute phase of infection. The expected result is Negative. Fact Sheet for Patients:  http://www.graves-ford.org/ Fact Sheet for Healthcare Providers: EnviroConcern.si This test is not yet approved or cleared by the [place] FDA and has been authorized for detection and/or diagnosis of SARS CoV 2 by FDA under an Emergency Use Authorization (EUA).   This EUA will remain in effect (meaning this test can be used) for the duration of the COVID19 declaration under Section 564(b)(1) of the Act, 21 U.S.C. section [phone] 3(b)(1), unless the authorization is terminated or revoked sooner. Performed at  [facility], [address] [place], [place] [address]   Culture, blood (routine x 2)     Status: None   Collection Time: 10/22/18  9:49 PM  Result Value Ref Range Status   Specimen Description BLOOD LEFT HAND  Final   Special Requests   Final    BOTTLES DRAWN AEROBIC ONLY Blood  Culture adequate volume   Culture   Final    NO GROWTH 5 DAYS Performed at [facility], [address] [place], [place] [address]    Report Status 10/28/2018 FINAL  Final  Culture, blood (routine x 2)     Status: Abnormal   Collection Time: 10/22/18  9:54 PM  Result Value Ref Range Status   Specimen Description   Final    BLOOD Performed at [facility], [address], [place], [place] [address]    Special Requests   Final    NONE Performed at [facility], [address] [place], [place] [address]    Culture  Setup Time   Final    GRAM POSITIVE COCCI IN CLUSTERS AEROBIC BOTTLE ONLY CRITICAL RESULT CALLED TO, READ BACK BY AND VERIFIED WITH: N. BATCHELDER,PHARMD 0543 10/24/2018 T. TYSOR    Culture (A)  Final    STAPHYLOCOCCUS SPECIES (COAGULASE NEGATIVE) THE SIGNIFICANCE OF ISOLATING THIS ORGANISM FROM A SINGLE SET OF BLOOD CULTURES WHEN MULTIPLE SETS ARE DRAWN IS UNCERTAIN. PLEASE NOTIFY THE MICROBIOLOGY DEPARTMENT WITHIN ONE WEEK IF SPECIATION AND SENSITIVITIES ARE REQUIRED. Performed at [facility], [address] [place], [place] [address]    Report Status 10/26/2018 FINAL  Final  Blood Culture ID Panel (Reflexed)     Status: Abnormal   Collection Time: 10/22/18  9:54 PM  Result Value Ref Range Status   Enterococcus species NOT DETECTED NOT DETECTED Final   Listeria monocytogenes NOT DETECTED NOT DETECTED Final   Staphylococcus species DETECTED (A) NOT DETECTED Final    Comment: Methicillin (oxacillin) susceptible coagulase negative staphylococcus. Possible blood culture contaminant (unless isolated from more than one blood  culture draw or clinical case suggests pathogenicity). No antibiotic treatment is indicated for blood  culture contaminants. CRITICAL RESULT CALLED TO, READ BACK BY AND VERIFIED WITH: N. BATCHELDER,PHARMD 0543 10/24/2018 T. TYSOR    Staphylococcus aureus (BCID) NOT DETECTED NOT DETECTED Final   Methicillin resistance NOT DETECTED NOT DETECTED Final   Streptococcus species NOT DETECTED NOT DETECTED Final   Streptococcus agalactiae NOT DETECTED NOT DETECTED Final   Streptococcus pneumoniae NOT DETECTED NOT DETECTED Final   Streptococcus pyogenes NOT DETECTED NOT DETECTED Final   Acinetobacter baumannii NOT DETECTED NOT DETECTED Final   Enterobacteriaceae species NOT DETECTED NOT DETECTED Final   Enterobacter cloacae complex NOT DETECTED NOT DETECTED Final   Escherichia coli NOT DETECTED NOT DETECTED Final   Klebsiella oxytoca NOT DETECTED NOT DETECTED Final   Klebsiella pneumoniae NOT DETECTED NOT DETECTED Final   Proteus species NOT DETECTED NOT DETECTED Final   Serratia marcescens NOT DETECTED NOT DETECTED Final   Haemophilus influenzae NOT DETECTED NOT DETECTED Final   Neisseria meningitidis NOT DETECTED NOT DETECTED Final   Pseudomonas aeruginosa NOT DETECTED NOT DETECTED Final   Candida albicans NOT DETECTED NOT DETECTED Final   Candida glabrata NOT DETECTED NOT DETECTED Final   Candida krusei NOT DETECTED NOT DETECTED Final   Candida parapsilosis NOT DETECTED NOT DETECTED Final   Candida tropicalis NOT DETECTED NOT DETECTED Final    Comment: Performed at [facility], [address], [place], [place] [address]  MRSA PCR Screening     Status: None   Collection Time: 10/25/18  2:25 AM  Result Value Ref Range Status   MRSA by PCR NEGATIVE NEGATIVE Final    Comment:        The GeneXpert MRSA Assay (FDA approved for NASAL specimens only), is  one component of a comprehensive MRSA colonization surveillance program. It is not intended to diagnose MRSA infection nor to guide  or monitor treatment for MRSA infections. Performed at [facility], [address] [place], [place] [address]       Radiology Studies: Dg Chest Port 1 View  Result Date: 10/31/2018 CLINICAL DATA:  Respiratory difficulty EXAM: PORTABLE CHEST 1 VIEW COMPARISON:  10/28/2018 FINDINGS: Endotracheal and NG tubes stable. Right upper extremity PICC stable. Normal heart size. Bilateral diffuse airspace disease left greater than right is stable. No pneumothorax. IMPRESSION: Stable bilateral airspace disease left greater than right. Electronically Signed   By: Jolaine Click M.D.   On: 10/31/2018 07:22   Pamella Pert, MD, PhD [facility]  Contact via  [email or web address]  [facility] Office Info P: [phone]  F: [phone]

## 2018-11-01 NOTE — Plan of Care (Signed)
Discussed with daughter plan of care for the evening, sedation medications and concerns over her troponin levels with some teach back displayed.  The daughter did not receive a call from the doctor today and aplogy given with reassurance they would be in touch tomorrow.

## 2018-11-02 LAB — CBC
HCT: 43.3 % (ref 36.0–46.0)
Hemoglobin: 13.4 g/dL (ref 12.0–15.0)
MCH: 27.5 pg (ref 26.0–34.0)
MCHC: 30.9 g/dL (ref 30.0–36.0)
MCV: 88.9 fL (ref 80.0–100.0)
Platelets: 290 10*3/uL (ref 150–400)
RBC: 4.87 MIL/uL (ref 3.87–5.11)
RDW: 17.1 % — ABNORMAL HIGH (ref 11.5–15.5)
WBC: 16.1 10*3/uL — ABNORMAL HIGH (ref 4.0–10.5)
nRBC: 0.4 % — ABNORMAL HIGH (ref 0.0–0.2)

## 2018-11-02 LAB — COMPREHENSIVE METABOLIC PANEL
ALT: 67 U/L — ABNORMAL HIGH (ref 0–44)
AST: 73 U/L — ABNORMAL HIGH (ref 15–41)
Albumin: 2.9 g/dL — ABNORMAL LOW (ref 3.5–5.0)
Alkaline Phosphatase: 157 U/L — ABNORMAL HIGH (ref 38–126)
Anion gap: 13 (ref 5–15)
BUN: 61 mg/dL — ABNORMAL HIGH (ref 6–20)
CO2: 39 mmol/L — ABNORMAL HIGH (ref 22–32)
Calcium: 8.7 mg/dL — ABNORMAL LOW (ref 8.9–10.3)
Chloride: 85 mmol/L — ABNORMAL LOW (ref 98–111)
Creatinine, Ser: 1.03 mg/dL — ABNORMAL HIGH (ref 0.44–1.00)
GFR calc Af Amer: >60 mL/min (ref >60)
GFR calc non Af Amer: >60 mL/min (ref >60)
Glucose, Bld: 142 mg/dL — ABNORMAL HIGH (ref 70–99)
Potassium: 3.2 mmol/L — ABNORMAL LOW (ref 3.5–5.1)
Sodium: 137 mmol/L (ref 135–145)
Total Bilirubin: 0.5 mg/dL (ref 0.3–1.2)
Total Protein: 6.3 g/dL — ABNORMAL LOW (ref 6.5–8.1)

## 2018-11-02 LAB — GLUCOSE, CAPILLARY
Glucose-Capillary: 154 mg/dL — ABNORMAL HIGH (ref 70–99)
Glucose-Capillary: 142 mg/dL — ABNORMAL HIGH (ref 70–99)
Glucose-Capillary: 164 mg/dL — ABNORMAL HIGH (ref 70–99)
Glucose-Capillary: 165 mg/dL — ABNORMAL HIGH (ref 70–99)
Glucose-Capillary: 158 mg/dL — ABNORMAL HIGH (ref 70–99)
Glucose-Capillary: 135 mg/dL — ABNORMAL HIGH (ref 70–99)

## 2018-11-02 LAB — PHOSPHORUS: Phosphorus: 4 mg/dL (ref 2.5–4.6)

## 2018-11-02 LAB — MAGNESIUM: Magnesium: 2.3 mg/dL (ref 1.7–2.4)

## 2018-11-02 MED ORDER — FUROSEMIDE 10 MG/ML IJ SOLN
60.0000 mg | Freq: Once | INTRAMUSCULAR | Status: AC
  Administered 2018-11-02: 11:00:00 60 mg via INTRAVENOUS
  Filled 2018-11-02: qty 6

## 2018-11-02 MED ORDER — POTASSIUM CHLORIDE 20 MEQ/15ML (10%) PO SOLN
40.0000 meq | Freq: Two times a day (BID) | ORAL | Status: AC
  Administered 2018-11-02 (×2): 40 meq via ORAL
  Filled 2018-11-02 (×2): qty 30

## 2018-11-02 MED ORDER — HYDROCORTISONE NA SUCCINATE PF 100 MG IJ SOLR
50.0000 mg | Freq: Every day | INTRAMUSCULAR | Status: DC
  Administered 2018-11-03: 50 mg via INTRAVENOUS
  Filled 2018-11-02: qty 2

## 2018-11-02 MED ORDER — INSULIN ASPART 100 UNIT/ML ~~LOC~~ SOLN
0.0000 [IU] | SUBCUTANEOUS | Status: DC
  Administered 2018-11-02 (×3): 2 [IU] via SUBCUTANEOUS
  Administered 2018-11-02 – 2018-11-03 (×2): 1 [IU] via SUBCUTANEOUS
  Administered 2018-11-03: 17:00:00 2 [IU] via SUBCUTANEOUS
  Administered 2018-11-03 – 2018-11-05 (×9): 1 [IU] via SUBCUTANEOUS
  Administered 2018-11-05: 16:00:00 2 [IU] via SUBCUTANEOUS
  Administered 2018-11-06 – 2018-11-18 (×8): 1 [IU] via SUBCUTANEOUS
  Administered 2018-11-18: 2 [IU] via SUBCUTANEOUS
  Administered 2018-11-19 – 2018-12-02 (×18): 1 [IU] via SUBCUTANEOUS

## 2018-11-02 MED ORDER — INSULIN ASPART 100 UNIT/ML ~~LOC~~ SOLN: 0.0000 [IU] | SUBCUTANEOUS | Status: DC

## 2018-11-02 NOTE — Progress Notes (Signed)
Called [place] and updated on plan of care and patient status. Answered all questions and verbalized understanding. Stated that it was okay to give information to sister calling from [place]. Will continue to monitor.

## 2018-11-02 NOTE — Plan of Care (Signed)
Discussed with daughter plan of care for the evening, weaning off of ventilator and her mom's love for gospel music with some teach back displayed

## 2018-11-02 NOTE — Progress Notes (Signed)
NAME:  Shannon Meyer, MRN:  [id number], DOB:  03/13/1966, LOS: 11 ADMISSION DATE:  10/22/2018, CONSULTATION DATE:  Oct 22, 2018 REFERRING MD:  Pilar PlateBero, CHIEF COMPLAINT:  Dyspnea   Brief History   53 y/o female with no PMH [facility] with acute respiratory failure with hypoxemia due to COVID-19 pneumonia.   Past Medical History  No known past medical history  Significant Hospital Events   [facility]  Consults:  Pulmonary and critical care medicine  Procedures:  May 20 ET tube>  May 24 PICC line right arm>   Significant Diagnostic Tests:  Oct 28, 2018 CT angiogram chest no pulmonary embolism, bilateral atypical pneumonia consistent with COVID-19  Micro Data:  SARS-CoV-2 May 20+ Blood culture May 20 1 out of 2 GPC staph, coag negative  Antimicrobials:  Zithromax 5/20 >>5/24 Rocephin 5/20 >>  Remdesivir 5/21 >   Actemra 5/20> 5/  Interim history/subjective:  Weans on ventilator for about 2-1/2 hours today Weaned yesterday for 2 hours Became tachypneic, tachycardic at the end of pressure support session Otherwise no acute events  Objective   Blood pressure 111/74, pulse 81, temperature 98.5 F (36.9 C), temperature source Axillary, resp. rate (!) 24, height 5\' 4"  (1.626 m), weight 91 kg, SpO2 96 %.    Vent Mode: PRVC FiO2 (%):  [50 %] 50 % Set Rate:  [24 bmp] 24 bmp Vt Set:  [330 mL] 330 mL PEEP:  [5 cmH20] 5 cmH20 Plateau Pressure:  [22 cmH20-24 cmH20] 24 cmH20   Intake/Output Summary (Last 24 hours) at 11/02/2018 0739 Last data filed at 11/02/2018 [id number] Gross per 24 hour  Intake 2134.06 ml  Output 4825 ml  Net -2690.94 ml   Filed Weights   10/29/18 0445 11/01/18 0600 11/02/18 0500  Weight: 94 kg 90.3 kg 91 kg    Examination:  General:  In bed on vent HENT: NCAT ETT in place PULM: CTA B, vent supported breathing CV: RRR, no mgr GI: BS+, soft, nontender MSK: normal bulk and tone Neuro: sedated on vent     Resolved Hospital Problem list     Assessment & Plan:  Acute respiratory failure with hypoxemia due to COVID-19: slow to wean Diuresis to continue today Pressure support efforts twice a day at least, OK to do pressure support weaning at night too Continue full mechanical ventilatory support with ARDS protocol, target tidal volume 6 to 8 cc/kg ideal body weight Keep plateau pressure less than 30 Ventilator associated pneumonia prevention protocol Target PaO2 to FiO2 ratio greater than 150 Continue remdesivir, plan 10 day course Continue hydrocortisone  Best practice:  Diet: tube feeding Pain/Anxiety/Delirium protocol (if indicated): Yes, RASS score -1 VAP protocol (if indicated): yes DVT prophylaxis: lovenox GI prophylaxis:  famoditine Glucose control: per [facility] Mobility: bedrest Code Status: Full Family Communication: I updated her son Amalia HaileyDustin by phone on 5/31 Disposition: remain in ICU  Labs   CBC: Recent Labs  Lab 10/29/18 0150 10/30/18 0300 10/31/18 0330 10/31/18 0405 11/01/18 0420 11/02/18 0130  WBC 17.9* 13.1* 10.6*  --  11.4* 16.1*  HGB 11.3* 11.6* 12.1 13.3 13.6 13.4  HCT 36.5 37.3 38.2 39.0 43.3 43.3  MCV 89.0 91.6 91.4  --  89.5 88.9  PLT 392 378 346  --  337 290    Basic Metabolic Panel: Recent Labs  Lab 10/29/18 0150 10/30/18 0300 10/31/18 0330 10/31/18 0405 11/01/18 0420 11/02/18 0130  NA 137 138 132* 132* 134* 137  K  3.3* 4.0 3.2* 3.2* 3.4* 3.2*  CL 95* 100 89*  --  88* 85*  CO2 29 29 30   --  30 39*  GLUCOSE 102* 96 106*  --  93 142*  BUN 38* 31* 37*  --  47* 61*  CREATININE 0.63 0.62 0.79  --  0.81 1.03*  CALCIUM 7.9* 8.2* 8.0*  --  8.5* 8.7*  MG  --   --  2.0  --  2.0 2.3  PHOS  --   --  4.0  --  4.5 4.0   GFR: Estimated Creatinine Clearance: 67.4 mL/min (A) (by C-G formula based on SCr of 1.03 mg/dL (H)). Recent Labs  Lab 10/27/18 0115  10/30/18 0300 10/31/18 0330 11/01/18 0420 11/02/18 0130  PROCALCITON 0.25  --   --   --   --    --   WBC 31.3*   < > 13.1* 10.6* 11.4* 16.1*   < > = values in this interval not displayed.    Liver Function Tests: Recent Labs  Lab 10/29/18 0150 10/30/18 0300 10/31/18 0330 11/01/18 0420 11/02/18 0130  AST 44* 52* 54* 88* 73*  ALT 45* 47* 41 64* 67*  ALKPHOS 135* 133* 128* 150* 157*  BILITOT 0.1* 0.3 0.5 0.4 0.5  PROT 5.7* 5.4* 5.4* 6.1* 6.3*  ALBUMIN 2.4* 2.5* 2.6* 2.9* 2.9*   No results for input(s): LIPASE, AMYLASE in the last 168 hours. No results for input(s): AMMONIA in the last 168 hours.  ABG    Component Value Date/Time   PHART 7.457 (H) 10/31/2018 0405   PCO2ART 49.9 (H) 10/31/2018 0405   PO2ART 52.0 (L) 10/31/2018 0405   HCO3 35.2 (H) 10/31/2018 0405   TCO2 37 (H) 10/31/2018 0405   ACIDBASEDEF 1.0 10/24/2018 0419   O2SAT 87.0 10/31/2018 0405     Coagulation Profile: No results for input(s): INR, PROTIME in the last 168 hours.  Cardiac Enzymes: Recent Labs  Lab 10/26/18 1346 10/26/18 1845 10/27/18 0115  TROPONINI 0.08* 0.05* 0.04*    HbA1C: No results found for: HGBA1C  CBG: Recent Labs  Lab 11/01/18 1544 11/01/18 2036 11/01/18 2346 11/02/18 0315 11/02/18 0709  GLUCAP 173* 144* 139* 154* 142*     Critical care time: 31 minutes     Heber [place], MD [place] [facility] Pager: [phone] Cell: [phone] If no response, call [phone]

## 2018-11-02 NOTE — Plan of Care (Signed)

## 2018-11-02 NOTE — Progress Notes (Signed)
PROGRESS NOTE  Shannon Meyer ZOX:[id number] DOB: [birth year] DOA: 10/22/2018 PCP: No primary care provider on file.   LOS: 11 days   Brief Narrative / Interim history: 53 year old female without significant past medical history admitted to the hospital on 10/22/2018 with 5-day history of weakness, cough, shortness of breath, she was found to be profoundly hypoxic in the emergency room and intubated.  She was found to be Covid 19+.  Subjective: -No further overnight events, on pressure support this morning.   Assessment & Plan: Principal Problem:   COVID-19 Active Problems:   Leukocytosis   Sepsis (HCC)   Elevated LFTs   QT prolongation   Acute respiratory failure with hypoxia (HCC)   Principal Problem Acute Hypoxic Respiratory Failure due to Covid-19 Viral Illness -Remains on vent, weaning trials daily per critical care. She does well for a while then becomes tachypneic and needing to go back on vent support. -CT scan on admission without evidence of PE but with significant groundglass opacities bilaterally -Received Actemra 5/20, 5/21 -Status post Remdesivir as well as IV steroids, stopped 5/26 -Status post treatment with ceftriaxone and azithromycin as well. -Continue diuresis   Vent Mode: PRVC FiO2 (%):  [50 %] 50 % Set Rate:  [24 bmp] 24 bmp Vt Set:  [330 mL] 330 mL PEEP:  [5 cmH20] 5 cmH20 Pressure Support:  [16 cmH20] 16 cmH20 Plateau Pressure:  [22 cmH20-24 cmH20] 24 cmH20  COVID-19 Labs  No results for input(s): DDIMER, FERRITIN, LDH, CRP in the last 72 hours.  Lab Results  Component Value Date   SARSCOV2NAA POSITIVE (A) 10/22/2018   Active Problems Hypotension/hypothermia -On 5/30, patient is cortisol was checked and it was normal at 24, she was placed on stress of steroids which we will wean off  -Developed leukocytosis which is likely steroid-induced -We will add sliding scale due to hyperglycemia which is also probably due to steroids  Sepsis, present  on admission -Resolved  Transaminitis -Minimally elevated, overall improvement  QT prolongation -Noted to be 490 ms.  Avoid QT prolonging agents as much as possible.  Hypernatremia -Now normalized.  Monitor while on Lasix  Hypokalemia -Replete as indicated  Scheduled Meds: . bisacodyl  10 mg Oral Once  . chlorhexidine  15 mL Mouth/Throat BID  . Chlorhexidine Gluconate Cloth  6 each Topical Daily  . enoxaparin (LOVENOX) injection  40 mg Subcutaneous Q12H  . famotidine  20 mg Per Tube Q12H  . feeding supplement (PRO-STAT SUGAR FREE 64)  60 mL Per Tube Daily  . feeding supplement (VITAL HIGH PROTEIN)  1,000 mL Per Tube Q24H  . furosemide  60 mg Intravenous Once  . hydrocortisone sod succinate (SOLU-CORTEF) inj  50 mg Intravenous Q12H  . insulin aspart  0-9 Units Subcutaneous Q4H  . mouth rinse  15 mL Mouth Rinse 10 times per day  . multivitamin with minerals  1 tablet Per Tube Daily  . oxyCODONE  5 mg Per Tube Q8H  . potassium chloride  40 mEq Oral BID  . sodium chloride flush  10-40 mL Intracatheter Q12H  . sodium chloride flush  3 mL Intravenous Q12H  . vitamin C  500 mg Per Tube Daily  . zinc sulfate  220 mg Per Tube Daily   Continuous Infusions: . sodium chloride 10 mL/hr at 11/02/18 0800  . dexmedetomidine (PRECEDEX) IV infusion 0.6 mcg/kg/hr (11/02/18 1035)  . fentaNYL infusion INTRAVENOUS Stopped (11/01/18 1544)  . fluconazole (DIFLUCAN) IV 100 mg (11/02/18 0916)  . norepinephrine (LEVOPHED) Adult infusion 3  mcg/min (11/02/18 0800)   PRN Meds:.acetaminophen (TYLENOL) oral liquid 160 mg/5 mL, bisacodyl, docusate, fentaNYL, fentaNYL (SUBLIMAZE) injection, hydrALAZINE, midazolam, polyethylene glycol, sodium chloride flush  DVT prophylaxis: Lovenox Code Status: Full code Family Communication: Discussed with daughter over the phone on 5/29 Disposition Plan: TBD  Consultants:   PCCM  Procedures:   5/20 >> ETT  5/24 >> R PICC  Antimicrobials: Zithromax 5/20  >>5/24 Rocephin 5/20 >>  Remdesivir 5/21 >   Actemra 5/20> 5/21   Objective: Vitals:   11/02/18 0600 11/02/18 0634 11/02/18 0700 11/02/18 0800  BP: 105/69  111/74 109/73  Pulse: 80 81 81 81  Resp: (!) 24 (!) 24 (!) 24 (!) 26  Temp:    98.5 F (36.9 C)  TempSrc:    Axillary  SpO2: 95% 95% 96% 96%  Weight:      Height:        Intake/Output Summary (Last 24 hours) at 11/02/2018 1037 Last data filed at 11/02/2018 0800 Gross per 24 hour  Intake 2028.91 ml  Output 4625 ml  Net -2596.09 ml   Filed Weights   10/29/18 0445 11/01/18 0600 11/02/18 0500  Weight: 94 kg 90.3 kg 91 kg    Examination:  Constitutional: Sedated on the vent, tries to open eyes when called Eyes: No scleral icterus seen ENMT: Dry membranes Respiratory: Diminished breath sounds at the bases, no wheezing, breathing with the vent Cardiovascular: Regular rate and rhythm, no peripheral edema.  No murmurs appreciated Abdomen: Soft, bowel sounds positive Skin: No rashes appreciated Neurologic: Intubated and sedated   Data Reviewed: I have independently reviewed following labs and imaging studies   CBC: Recent Labs  Lab 10/29/18 0150 10/30/18 0300 10/31/18 0330 10/31/18 0405 11/01/18 0420 11/02/18 0130  WBC 17.9* 13.1* 10.6*  --  11.4* 16.1*  HGB 11.3* 11.6* 12.1 13.3 13.6 13.4  HCT 36.5 37.3 38.2 39.0 43.3 43.3  MCV 89.0 91.6 91.4  --  89.5 88.9  PLT 392 378 346  --  337 290   Basic Metabolic Panel: Recent Labs  Lab 10/29/18 0150 10/30/18 0300 10/31/18 0330 10/31/18 0405 11/01/18 0420 11/02/18 0130  NA 137 138 132* 132* 134* 137  K 3.3* 4.0 3.2* 3.2* 3.4* 3.2*  CL 95* 100 89*  --  88* 85*  CO2 29 29 30   --  30 39*  GLUCOSE 102* 96 106*  --  93 142*  BUN 38* 31* 37*  --  47* 61*  CREATININE 0.63 0.62 0.79  --  0.81 1.03*  CALCIUM 7.9* 8.2* 8.0*  --  8.5* 8.7*  MG  --   --  2.0  --  2.0 2.3  PHOS  --   --  4.0  --  4.5 4.0   GFR: Estimated Creatinine Clearance: 67.4 mL/min (A) (by  C-G formula based on SCr of 1.03 mg/dL (H)). Liver Function Tests: Recent Labs  Lab 10/29/18 0150 10/30/18 0300 10/31/18 0330 11/01/18 0420 11/02/18 0130  AST 44* 52* 54* 88* 73*  ALT 45* 47* 41 64* 67*  ALKPHOS 135* 133* 128* 150* 157*  BILITOT 0.1* 0.3 0.5 0.4 0.5  PROT 5.7* 5.4* 5.4* 6.1* 6.3*  ALBUMIN 2.4* 2.5* 2.6* 2.9* 2.9*   No results for input(s): LIPASE, AMYLASE in the last 168 hours. No results for input(s): AMMONIA in the last 168 hours. Coagulation Profile: No results for input(s): INR, PROTIME in the last 168 hours. Cardiac Enzymes: Recent Labs  Lab 10/26/18 1346 10/26/18 1845 10/27/18 0115  TROPONINI 0.08* 0.05*  0.04*   BNP (last 3 results) No results for input(s): PROBNP in the last 8760 hours. HbA1C: No results for input(s): HGBA1C in the last 72 hours. CBG: Recent Labs  Lab 11/01/18 1544 11/01/18 2036 11/01/18 2346 11/02/18 0315 11/02/18 0709  GLUCAP 173* 144* 139* 154* 142*   Lipid Profile: Recent Labs    11/01/18 0420  TRIG 207*   Thyroid Function Tests: No results for input(s): TSH, T4TOTAL, FREET4, T3FREE, THYROIDAB in the last 72 hours. Anemia Panel: No results for input(s): VITAMINB12, FOLATE, FERRITIN, TIBC, IRON, RETICCTPCT in the last 72 hours. Urine analysis:    Component Value Date/Time   COLORURINE STRAW (A) 11/01/2018 0901   APPEARANCEUR HAZY (A) 11/01/2018 0901   LABSPEC 1.010 11/01/2018 0901   PHURINE 5.0 11/01/2018 0901   GLUCOSEU NEGATIVE 11/01/2018 0901   HGBUR NEGATIVE 11/01/2018 0901   BILIRUBINUR NEGATIVE 11/01/2018 0901   KETONESUR NEGATIVE 11/01/2018 0901   PROTEINUR NEGATIVE 11/01/2018 0901   NITRITE NEGATIVE 11/01/2018 0901   LEUKOCYTESUR NEGATIVE 11/01/2018 0901   Sepsis Labs: Invalid input(s): PROCALCITONIN, LACTICIDVEN  Recent Results (from the past 240 hour(s))  MRSA PCR Screening     Status: None   Collection Time: 10/25/18  2:25 AM  Result Value Ref Range Status   MRSA by PCR NEGATIVE NEGATIVE  Final    Comment:        The GeneXpert MRSA Assay (FDA approved for NASAL specimens only), is one component of a comprehensive MRSA colonization surveillance program. It is not intended to diagnose MRSA infection nor to guide or monitor treatment for MRSA infections. Performed at [facility], [address], [place], [place] [address]       Radiology Studies: No results found. Pamella Pert, MD, PhD [facility]  Contact via  [email or web address]  [facility] Office Info P: [phone]  F: [phone]

## 2018-11-03 LAB — POCT I-STAT 7, (LYTES, BLD GAS, ICA,H+H)
Acid-Base Excess: 16 mmol/L — ABNORMAL HIGH (ref 0.0–2.0)
Bicarbonate: 42.6 mmol/L — ABNORMAL HIGH (ref 20.0–28.0)
Calcium, Ion: 1.15 mmol/L (ref 1.15–1.40)
HCT: 43 % (ref 36.0–46.0)
Hemoglobin: 14.6 g/dL (ref 12.0–15.0)
O2 Saturation: 91 %
Patient temperature: 98.8
Potassium: 3.3 mmol/L — ABNORMAL LOW (ref 3.5–5.1)
Sodium: 137 mmol/L (ref 135–145)
TCO2: 44 mmol/L — ABNORMAL HIGH (ref 22–32)
pCO2 arterial: 54.8 mmHg — ABNORMAL HIGH (ref 32.0–48.0)
pH, Arterial: 7.499 — ABNORMAL HIGH (ref 7.350–7.450)
pO2, Arterial: 57 mmHg — ABNORMAL LOW (ref 83.0–108.0)

## 2018-11-03 LAB — BASIC METABOLIC PANEL
Anion gap: 11 (ref 5–15)
BUN: 68 mg/dL — ABNORMAL HIGH (ref 6–20)
CO2: 38 mmol/L — ABNORMAL HIGH (ref 22–32)
Calcium: 8.8 mg/dL — ABNORMAL LOW (ref 8.9–10.3)
Chloride: 87 mmol/L — ABNORMAL LOW (ref 98–111)
Creatinine, Ser: 1.02 mg/dL — ABNORMAL HIGH (ref 0.44–1.00)
GFR calc Af Amer: >60 mL/min (ref >60)
GFR calc non Af Amer: >60 mL/min (ref >60)
Glucose, Bld: 124 mg/dL — ABNORMAL HIGH (ref 70–99)
Potassium: 3.4 mmol/L — ABNORMAL LOW (ref 3.5–5.1)
Sodium: 136 mmol/L (ref 135–145)

## 2018-11-03 LAB — CBC
HCT: 44.2 % (ref 36.0–46.0)
Hemoglobin: 13.5 g/dL (ref 12.0–15.0)
MCH: 27.7 pg (ref 26.0–34.0)
MCHC: 30.5 g/dL (ref 30.0–36.0)
MCV: 90.8 fL (ref 80.0–100.0)
Platelets: 223 10*3/uL (ref 150–400)
RBC: 4.87 MIL/uL (ref 3.87–5.11)
RDW: 18.4 % — ABNORMAL HIGH (ref 11.5–15.5)
WBC: 20.6 10*3/uL — ABNORMAL HIGH (ref 4.0–10.5)
nRBC: 0 % (ref 0.0–0.2)

## 2018-11-03 LAB — GLUCOSE, CAPILLARY
Glucose-Capillary: 114 mg/dL — ABNORMAL HIGH (ref 70–99)
Glucose-Capillary: 136 mg/dL — ABNORMAL HIGH (ref 70–99)
Glucose-Capillary: 138 mg/dL — ABNORMAL HIGH (ref 70–99)
Glucose-Capillary: 139 mg/dL — ABNORMAL HIGH (ref 70–99)
Glucose-Capillary: 150 mg/dL — ABNORMAL HIGH (ref 70–99)
Glucose-Capillary: 165 mg/dL — ABNORMAL HIGH (ref 70–99)
Glucose-Capillary: 178 mg/dL — ABNORMAL HIGH (ref 70–99)

## 2018-11-03 MED ORDER — POTASSIUM CHLORIDE 20 MEQ/15ML (10%) PO SOLN
40.0000 meq | ORAL | Status: AC
  Administered 2018-11-03 (×2): 40 meq via ORAL
  Filled 2018-11-03 (×2): qty 30

## 2018-11-03 NOTE — Progress Notes (Signed)
NAME:  Shannon BradfordDeborah Meyer, MRN:  [id number], DOB:  03/11/1966, LOS: 12 ADMISSION DATE:  10/22/2018, CONSULTATION DATE:  Oct 22, 2018 REFERRING MD:  Pilar PlateBero, CHIEF COMPLAINT:  Dyspnea   Brief History   53 y/o female with no PMH [facility] with acute respiratory failure with hypoxemia due to COVID-19 pneumonia.   Past Medical History  No known past medical history  Significant Hospital Events   [facility]  Consults:  Pulmonary and critical care medicine  Procedures:  May 20 ET tube>  May 24 PICC line right arm>   Significant Diagnostic Tests:  Oct 28, 2018 CT angiogram chest no pulmonary embolism, bilateral atypical pneumonia consistent with COVID-19  Micro Data:  SARS-CoV-2 May 20+ Blood culture May 20 1 out of 2 GPC staph, coag negative  Antimicrobials:  Zithromax 5/20 >>5/24 Rocephin 5/20 >>  Remdesivir 5/21 >   Actemra 5/20> 5/21  Interim history/subjective:  Weaned for about 2 hours this morning Still following commands Becomes tachycardic and tachypneic after weaning Continues to diurese   Objective   Blood pressure 109/78, pulse 64, temperature 97.9 F (36.6 C), temperature source Oral, resp. rate (!) 24, height 5\' 4"  (1.626 m), weight 89.2 kg, SpO2 94 %.    Vent Mode: PRVC FiO2 (%):  [50 %] 50 % Set Rate:  [14 bmp-24 bmp] 24 bmp Vt Set:  [330 mL] 330 mL PEEP:  [5 cmH20] 5 cmH20 Plateau Pressure:  [24 cmH20-28 cmH20] 28 cmH20   Intake/Output Summary (Last 24 hours) at 11/03/2018 0741 Last data filed at 11/03/2018 0700 Gross per 24 hour  Intake 2397.64 ml  Output 3100 ml  Net -702.36 ml   Filed Weights   11/01/18 0600 11/02/18 0500 11/03/18 0500  Weight: 90.3 kg 91 kg 89.2 kg    Examination:  General:  In bed on vent HENT: NCAT ETT in place PULM: CTA B, vent supported breathing CV: RRR, no mgr GI: BS+, soft, nontender MSK: normal bulk and tone Neuro: sedated on vent    Resolved Hospital Problem  list     Assessment & Plan:  Acute respiratory failure with hypoxemia due to COVID-19: slow to wean Hold diuresis today Repeat CXR in AM Continue pressure support efforts at least twice a day In the meantime continue full mechanical ventilatory support targeting 6 to 8 cc/kg ideal body weight Keep plateau pressure less than 30 Ventilator associated pneumonia prevention protocol Wean off hydrocortisone Discontinue remdesivir considering data showing it is equally effective in 5 day courses as 10 day courses. Suspect this will be a slow process   Best practice:  Diet: tube feeding Pain/Anxiety/Delirium protocol (if indicated): Yes, RASS score -1 VAP protocol (if indicated): yes DVT prophylaxis: lovenox GI prophylaxis:  famoditine Glucose control: per [facility] Mobility: bedrest Code Status: Full Family Communication: I updated her daughter Shannon Meyer on 6/1 Disposition: remain in ICU  Labs   CBC: Recent Labs  Lab 10/30/18 0300 10/31/18 0330 10/31/18 0405 11/01/18 0420 11/02/18 0130 11/03/18 0140 11/03/18 0420  WBC 13.1* 10.6*  --  11.4* 16.1* 20.6*  --   HGB 11.6* 12.1 13.3 13.6 13.4 13.5 14.6  HCT 37.3 38.2 39.0 43.3 43.3 44.2 43.0  MCV 91.6 91.4  --  89.5 88.9 90.8  --   PLT 378 346  --  337 290 223  --     Basic Metabolic Panel: Recent Labs  Lab 10/30/18 0300 10/31/18 0330 10/31/18 0405 11/01/18 0420 11/02/18 0130 11/03/18 0140  11/03/18 0420  NA 138 132* 132* 134* 137 136 137  K 4.0 3.2* 3.2* 3.4* 3.2* 3.4* 3.3*  CL 100 89*  --  88* 85* 87*  --   CO2 29 30  --  30 39* 38*  --   GLUCOSE 96 106*  --  93 142* 124*  --   BUN 31* 37*  --  47* 61* 68*  --   CREATININE 0.62 0.79  --  0.81 1.03* 1.02*  --   CALCIUM 8.2* 8.0*  --  8.5* 8.7* 8.8*  --   MG  --  2.0  --  2.0 2.3  --   --   PHOS  --  4.0  --  4.5 4.0  --   --    GFR: Estimated Creatinine Clearance: 67.4 mL/min (A) (by C-G formula based on SCr of 1.02 mg/dL (H)). Recent Labs  Lab 10/31/18 0330  11/01/18 0420 11/02/18 0130 11/03/18 0140  WBC 10.6* 11.4* 16.1* 20.6*    Liver Function Tests: Recent Labs  Lab 10/29/18 0150 10/30/18 0300 10/31/18 0330 11/01/18 0420 11/02/18 0130  AST 44* 52* 54* 88* 73*  ALT 45* 47* 41 64* 67*  ALKPHOS 135* 133* 128* 150* 157*  BILITOT 0.1* 0.3 0.5 0.4 0.5  PROT 5.7* 5.4* 5.4* 6.1* 6.3*  ALBUMIN 2.4* 2.5* 2.6* 2.9* 2.9*   No results for input(s): LIPASE, AMYLASE in the last 168 hours. No results for input(s): AMMONIA in the last 168 hours.  ABG    Component Value Date/Time   PHART 7.499 (H) 11/03/2018 0420   PCO2ART 54.8 (H) 11/03/2018 0420   PO2ART 57.0 (L) 11/03/2018 0420   HCO3 42.6 (H) 11/03/2018 0420   TCO2 44 (H) 11/03/2018 0420   ACIDBASEDEF 1.0 10/24/2018 0419   O2SAT 91.0 11/03/2018 0420     Coagulation Profile: No results for input(s): INR, PROTIME in the last 168 hours.  Cardiac Enzymes: No results for input(s): CKTOTAL, CKMB, CKMBINDEX, TROPONINI in the last 168 hours.  HbA1C: No results found for: HGBA1C  CBG: Recent Labs  Lab 11/02/18 1557 11/02/18 1913 11/02/18 2327 11/03/18 0423 11/03/18 0713  GLUCAP 165* 158* 135* 139* 136*     Critical care time: 35 minutes     Heber [place], MD [place] [facility] Pager: [phone] Cell: [phone] If no response, call [phone]

## 2018-11-03 NOTE — Progress Notes (Signed)
I spoke to patients daughter in [place] and updated her on patients status.

## 2018-11-03 NOTE — Progress Notes (Deleted)
Paged Dr Elvera Lennox to notify of pts high HR, looks to be in Afib 120s-140s, coincided with massive BM and turning patient.  Bolus of midazolam given & fent drip restarted.  No relief.  BP stable.  MD to enter orders.

## 2018-11-03 NOTE — Progress Notes (Signed)
Pts daughter called back.  Updated pts daughter Liborio Nixon & sister [name] on pt status.

## 2018-11-03 NOTE — Progress Notes (Signed)
PROGRESS NOTE  Shannon Meyer BLT:[id number] DOB: 09/07/1965 DOA: 10/22/2018 PCP: No primary care provider on file.   LOS: 12 days   Brief Narrative / Interim history: 53 year old female without significant past medical history admitted to the hospital on 10/22/2018 with 5-day history of weakness, cough, shortness of breath, she was found to be profoundly hypoxic in the emergency room and intubated.  She was found to be Covid 19+.  Subjective: -No significant events overnight.  Weaning for few hours but then becomes tachypneic  Assessment & Plan: Principal Problem:   COVID-19 Active Problems:   Leukocytosis   Sepsis (HCC)   Elevated LFTs   QT prolongation   Acute respiratory failure (HCC)   Principal Problem Acute Hypoxic Respiratory Failure due to Covid-19 Viral Illness -Remains on vent, weaning trials daily per critical care. She does well for a while then becomes tachypneic and needing to go back on vent support. -CT scan on admission without evidence of PE but with significant groundglass opacities bilaterally -Received Actemra 5/20, 5/21 -Status post Remdesivir as well as IV steroids, stopped 5/26 -Status post treatment with ceftriaxone and azithromycin as well. -Diuresed well yesterday, hold further Lasix today   Vent Mode: PRVC FiO2 (%):  [45 %-50 %] 45 % Set Rate:  [14 bmp-24 bmp] 20 bmp Vt Set:  [330 mL] 330 mL PEEP:  [5 cmH20] 5 cmH20 Pressure Support:  [14 cmH20] 14 cmH20 Plateau Pressure:  [27 cmH20-28 cmH20] 28 cmH20  COVID-19 Labs  No results for input(s): DDIMER, FERRITIN, LDH, CRP in the last 72 hours.  Lab Results  Component Value Date   SARSCOV2NAA POSITIVE (A) 10/22/2018   Active Problems Hypotension/hypothermia -On 5/30, patient is cortisol was checked and it was normal at 24, she was placed on stress of steroids which we will wean off, discontinue today -Developed leukocytosis which is likely steroid-induced  Sepsis, present on admission  -Resolved  Transaminitis -Minimally elevated, overall improvement  QT prolongation -Noted to be 490 ms.  Avoid QT prolonging agents as much as possible.  Hypernatremia -Now normalized.  Monitor while on Lasix  Hypokalemia -Replete as indicated  Scheduled Meds: . bisacodyl  10 mg Oral Once  . chlorhexidine  15 mL Mouth/Throat BID  . Chlorhexidine Gluconate Cloth  6 each Topical Daily  . enoxaparin (LOVENOX) injection  40 mg Subcutaneous Q12H  . famotidine  20 mg Per Tube Q12H  . feeding supplement (PRO-STAT SUGAR FREE 64)  60 mL Per Tube Daily  . feeding supplement (VITAL HIGH PROTEIN)  1,000 mL Per Tube Q24H  . insulin aspart  0-9 Units Subcutaneous Q4H  . mouth rinse  15 mL Mouth Rinse 10 times per day  . multivitamin with minerals  1 tablet Per Tube Daily  . oxyCODONE  5 mg Per Tube Q8H  . sodium chloride flush  10-40 mL Intracatheter Q12H  . sodium chloride flush  3 mL Intravenous Q12H   Continuous Infusions: . sodium chloride 10 mL/hr at 11/03/18 1000  . dexmedetomidine (PRECEDEX) IV infusion 0.4 mcg/kg/hr (11/03/18 1000)  . fentaNYL infusion INTRAVENOUS Stopped (11/01/18 1544)  . norepinephrine (LEVOPHED) Adult infusion 1 mcg/min (11/03/18 1000)   PRN Meds:.acetaminophen (TYLENOL) oral liquid 160 mg/5 mL, bisacodyl, docusate, fentaNYL, fentaNYL (SUBLIMAZE) injection, hydrALAZINE, midazolam, polyethylene glycol, sodium chloride flush  DVT prophylaxis: Lovenox Code Status: Full code Family Communication: Discussed with daughter over the phone on 5/29 Disposition Plan: TBD  Consultants:   PCCM  Procedures:   5/20 >> ETT  5/24 >> R PICC  Antimicrobials: Zithromax 5/20 >>5/24 Rocephin 5/20 >>  Remdesivir 5/21 >   Actemra 5/20> 5/21   Objective: Vitals:   11/03/18 0945 11/03/18 1000 11/03/18 1015 11/03/18 1030  BP: (!) 89/57 93/65 101/69 (!) 85/60  Pulse: 66 66 64 66  Resp: 20 (!) 25 (!) 25 (!) 26  Temp:      TempSrc:      SpO2: 95% 93% 94% 93%   Weight:      Height:        Intake/Output Summary (Last 24 hours) at 11/03/2018 1329 Last data filed at 11/03/2018 1000 Gross per 24 hour  Intake 2365.16 ml  Output 2180 ml  Net 185.16 ml   Filed Weights   11/01/18 0600 11/02/18 0500 11/03/18 0500  Weight: 90.3 kg 91 kg 89.2 kg    Examination:  Constitutional: Sedated on the vent, tries to open eyes Eyes: No scleral icterus seen ENMT: Dry membranes Respiratory: Diminished breath sounds at the bases, no wheezing, no crackles Cardiovascular: Regular rate and rhythm, no peripheral edema appreciated.  No murmurs Abdomen: Soft, bowel sounds positive Skin: No rashes appreciated Neurologic: Intubated and sedated   Data Reviewed: I have independently reviewed following labs and imaging studies   CBC: Recent Labs  Lab 10/30/18 0300 10/31/18 0330 10/31/18 0405 11/01/18 0420 11/02/18 0130 11/03/18 0140 11/03/18 0420  WBC 13.1* 10.6*  --  11.4* 16.1* 20.6*  --   HGB 11.6* 12.1 13.3 13.6 13.4 13.5 14.6  HCT 37.3 38.2 39.0 43.3 43.3 44.2 43.0  MCV 91.6 91.4  --  89.5 88.9 90.8  --   PLT 378 346  --  337 290 223  --    Basic Metabolic Panel: Recent Labs  Lab 10/30/18 0300 10/31/18 0330 10/31/18 0405 11/01/18 0420 11/02/18 0130 11/03/18 0140 11/03/18 0420  NA 138 132* 132* 134* 137 136 137  K 4.0 3.2* 3.2* 3.4* 3.2* 3.4* 3.3*  CL 100 89*  --  88* 85* 87*  --   CO2 29 30  --  30 39* 38*  --   GLUCOSE 96 106*  --  93 142* 124*  --   BUN 31* 37*  --  47* 61* 68*  --   CREATININE 0.62 0.79  --  0.81 1.03* 1.02*  --   CALCIUM 8.2* 8.0*  --  8.5* 8.7* 8.8*  --   MG  --  2.0  --  2.0 2.3  --   --   PHOS  --  4.0  --  4.5 4.0  --   --    GFR: Estimated Creatinine Clearance: 67.4 mL/min (A) (by C-G formula based on SCr of 1.02 mg/dL (H)). Liver Function Tests: Recent Labs  Lab 10/29/18 0150 10/30/18 0300 10/31/18 0330 11/01/18 0420 11/02/18 0130  AST 44* 52* 54* 88* 73*  ALT 45* 47* 41 64* 67*  ALKPHOS 135* 133*  128* 150* 157*  BILITOT 0.1* 0.3 0.5 0.4 0.5  PROT 5.7* 5.4* 5.4* 6.1* 6.3*  ALBUMIN 2.4* 2.5* 2.6* 2.9* 2.9*   No results for input(s): LIPASE, AMYLASE in the last 168 hours. No results for input(s): AMMONIA in the last 168 hours. Coagulation Profile: No results for input(s): INR, PROTIME in the last 168 hours. Cardiac Enzymes: No results for input(s): CKTOTAL, CKMB, CKMBINDEX, TROPONINI in the last 168 hours. BNP (last 3 results) No results for input(s): PROBNP in the last 8760 hours. HbA1C: No results for input(s): HGBA1C in the last 72 hours. CBG: Recent Labs  Lab 11/02/18  1913 11/02/18 2327 11/03/18 0423 11/03/18 0713 11/03/18 1238  GLUCAP 158* 135* 139* 136* 150*   Lipid Profile: Recent Labs    11/01/18 0420  TRIG 207*   Thyroid Function Tests: No results for input(s): TSH, T4TOTAL, FREET4, T3FREE, THYROIDAB in the last 72 hours. Anemia Panel: No results for input(s): VITAMINB12, FOLATE, FERRITIN, TIBC, IRON, RETICCTPCT in the last 72 hours. Urine analysis:    Component Value Date/Time   COLORURINE STRAW (A) 11/01/2018 0901   APPEARANCEUR HAZY (A) 11/01/2018 0901   LABSPEC 1.010 11/01/2018 0901   PHURINE 5.0 11/01/2018 0901   GLUCOSEU NEGATIVE 11/01/2018 0901   HGBUR NEGATIVE 11/01/2018 0901   BILIRUBINUR NEGATIVE 11/01/2018 0901   KETONESUR NEGATIVE 11/01/2018 0901   PROTEINUR NEGATIVE 11/01/2018 0901   NITRITE NEGATIVE 11/01/2018 0901   LEUKOCYTESUR NEGATIVE 11/01/2018 0901   Sepsis Labs: Invalid input(s): PROCALCITONIN, LACTICIDVEN  Recent Results (from the past 240 hour(s))  MRSA PCR Screening     Status: None   Collection Time: 10/25/18  2:25 AM  Result Value Ref Range Status   MRSA by PCR NEGATIVE NEGATIVE Final    Comment:        The GeneXpert MRSA Assay (FDA approved for NASAL specimens only), is one component of a comprehensive MRSA colonization surveillance program. It is not intended to diagnose MRSA infection nor to guide or monitor  treatment for MRSA infections. Performed at [facility], [address], [place], [place] [address]       Radiology Studies: No results found. Pamella Pert, MD, PhD [facility]  Contact via  [email or web address]  [facility] Office Info P: [phone]  F: [phone]

## 2018-11-03 NOTE — Progress Notes (Signed)
Called pts daughters number in chart, no answer and voicemail full.  Will await for return call to update on pt status.

## 2018-11-04 ENCOUNTER — Inpatient Hospital Stay (HOSPITAL_COMMUNITY): Payer: HRSA Program

## 2018-11-04 LAB — GLUCOSE, CAPILLARY
Glucose-Capillary: 127 mg/dL — ABNORMAL HIGH (ref 70–99)
Glucose-Capillary: 112 mg/dL — ABNORMAL HIGH (ref 70–99)
Glucose-Capillary: 100 mg/dL — ABNORMAL HIGH (ref 70–99)
Glucose-Capillary: 119 mg/dL — ABNORMAL HIGH (ref 70–99)
Glucose-Capillary: 132 mg/dL — ABNORMAL HIGH (ref 70–99)
Glucose-Capillary: 119 mg/dL — ABNORMAL HIGH (ref 70–99)

## 2018-11-04 LAB — COMPREHENSIVE METABOLIC PANEL
ALT: 38 U/L (ref 0–44)
AST: 38 U/L (ref 15–41)
Albumin: 2.5 g/dL — ABNORMAL LOW (ref 3.5–5.0)
Alkaline Phosphatase: 137 U/L — ABNORMAL HIGH (ref 38–126)
Anion gap: 12 (ref 5–15)
BUN: 65 mg/dL — ABNORMAL HIGH (ref 6–20)
CO2: 33 mmol/L — ABNORMAL HIGH (ref 22–32)
Calcium: 8.6 mg/dL — ABNORMAL LOW (ref 8.9–10.3)
Chloride: 95 mmol/L — ABNORMAL LOW (ref 98–111)
Creatinine, Ser: 0.78 mg/dL (ref 0.44–1.00)
GFR calc Af Amer: >60 mL/min (ref >60)
GFR calc non Af Amer: >60 mL/min (ref >60)
Glucose, Bld: 104 mg/dL — ABNORMAL HIGH (ref 70–99)
Potassium: 3.1 mmol/L — ABNORMAL LOW (ref 3.5–5.1)
Sodium: 140 mmol/L (ref 135–145)
Total Bilirubin: 0.5 mg/dL (ref 0.3–1.2)
Total Protein: 5.2 g/dL — ABNORMAL LOW (ref 6.5–8.1)

## 2018-11-04 LAB — D-DIMER, QUANTITATIVE: D-Dimer, Quant: 1.18 ug/mL-FEU — ABNORMAL HIGH (ref 0.00–0.50)

## 2018-11-04 LAB — CBC
HCT: 41.1 % (ref 36.0–46.0)
Hemoglobin: 12.7 g/dL (ref 12.0–15.0)
MCH: 28.6 pg (ref 26.0–34.0)
MCHC: 30.9 g/dL (ref 30.0–36.0)
MCV: 92.6 fL (ref 80.0–100.0)
Platelets: 181 10*3/uL (ref 150–400)
RBC: 4.44 MIL/uL (ref 3.87–5.11)
RDW: 18.6 % — ABNORMAL HIGH (ref 11.5–15.5)
WBC: 14.6 10*3/uL — ABNORMAL HIGH (ref 4.0–10.5)
nRBC: 0 % (ref 0.0–0.2)

## 2018-11-04 LAB — FIBRINOGEN: Fibrinogen: 459 mg/dL (ref 210–475)

## 2018-11-04 MED ORDER — FUROSEMIDE 10 MG/ML IJ SOLN
40.0000 mg | Freq: Four times a day (QID) | INTRAMUSCULAR | Status: AC
  Administered 2018-11-04 (×2): 40 mg via INTRAVENOUS
  Filled 2018-11-04 (×2): qty 4

## 2018-11-04 MED ORDER — POTASSIUM CHLORIDE 20 MEQ/15ML (10%) PO SOLN
40.0000 meq | ORAL | Status: AC
  Administered 2018-11-04 (×3): 40 meq via ORAL
  Filled 2018-11-04 (×4): qty 30

## 2018-11-04 NOTE — Plan of Care (Signed)
Discussed with sister from [place] weaning from the ventilator and the patient being more responsive to her name Shannon Meyer than Mrs, B[place] which she prefers per the daughter.  Sister seem to verbalize understanding and answered all her questions at this time

## 2018-11-04 NOTE — Progress Notes (Signed)
[facility] PCCM  I called [name] today to let her know that her mother's condition has neither worsened nor significantly improved today. I let her know that I believe it will take time for her lungs to heal.  Heber Jasper, MD [place] [facility] Pager: [phone] Cell: [phone] If no response, call [phone]

## 2018-11-04 NOTE — Plan of Care (Addendum)
Discussed with daughter plan of care for the evening, patient sedation and patient more responsive to voice at this time with some teach back displayed.  Daughter understood conversation and answered any questions related to weaning her off the ventilator and treatments at this time.

## 2018-11-04 NOTE — Progress Notes (Addendum)
NAME:  Shannon BradfordDeborah Soileau, MRN:  [id number], DOB:  06/11/1965, LOS: 13 ADMISSION DATE:  10/22/2018, CONSULTATION DATE:  Oct 22, 2018 REFERRING MD:  Pilar PlateBero, CHIEF COMPLAINT:  Dyspnea   Brief History   53 y/o female with no PMH [facility] with acute respiratory failure with hypoxemia due to COVID-19 pneumonia.   Past Medical History  No known past medical history  Significant Hospital Events   [facility]  Consults:  Pulmonary and critical care medicine  Procedures:  May 20 ET tube>  May 24 PICC line right arm>   Significant Diagnostic Tests:  Oct 28, 2018 CT angiogram chest no pulmonary embolism, bilateral atypical pneumonia consistent with COVID-19  Micro Data:  SARS-CoV-2 May 20+ Blood culture May 20 1 out of 2 [facility] staph, coag negative  Antimicrobials:  Zithromax 5/20 >>5/24 Rocephin 5/20 >>5/26  Remdesivir 5/21 > 5/30  Actemra 5/20> 5/21  Interim history/subjective:  Continues to have limited response to voice and stimulation.  Weaning but with shallow restricted breathing.  No acute concerns overnight.   Objective   Blood pressure 93/67, pulse 68, temperature 98.1 F (36.7 C), temperature source Oral, resp. rate (!) 22, height 5\' 4"  (1.626 m), weight 89 kg, SpO2 98 %.    Vent Mode: PRVC FiO2 (%):  [40 %-70 %] 70 % Set Rate:  [20 bmp] 20 bmp Vt Set:  [330 mL] 330 mL PEEP:  [5 cmH20-8 cmH20] 8 cmH20 Pressure Support:  [14 cmH20] 14 cmH20 Plateau Pressure:  [26 cmH20-27 cmH20] 27 cmH20   Intake/Output Summary (Last 24 hours) at 11/04/2018 [id number] Last data filed at 11/04/2018 [id number] Gross per 24 hour  Intake 1636.97 ml  Output 670 ml  Net 966.97 ml   Filed Weights   11/02/18 0500 11/03/18 0500 11/04/18 0500  Weight: 91 kg 89.2 kg 89 kg    Examination:  General:  Intubated, not responding to voice HENT: [place]/AT, ETT in place  PULM: clear breath sounds, no wheezing or rales  CV: RRR, no m/r/g  GI: +BS, soft, NTTP   MSK: no LE edema, extremities warm  Neuro: sedated and intubated    Resolved Hospital Problem list     Assessment & Plan:  Acute respiratory failure with hypoxemia due to COVID-19: slow to wean Intubated 12 days. Weaned from steroids yesterday. Chest x-ray with no significant change since previous.   - Continue to hold diuresis - net negative 8.5 L, appears euvolemic  - CMP pending - will replete K as needed  - Leukocytosis 2/2 solumedrol improving - Cont. bid weaning trials - VT 6-8 cc/kg ideal body weight  - am CBC/BMP   Best practice:  Diet: tube feeding Pain/Anxiety/Delirium protocol (if indicated): Yes, RASS score -1 VAP protocol (if indicated): yes DVT prophylaxis: lovenox GI prophylaxis:  famoditine Glucose control: per [facility] Mobility: bedrest Code Status: Full Family Communication: daughter Antonietta JewelJustine updated 6/1 Disposition: ICU, intubated  Labs   CBC: Recent Labs  Lab 10/30/18 0300 10/31/18 0330 10/31/18 0405 11/01/18 0420 11/02/18 0130 11/03/18 0140 11/03/18 0420  WBC 13.1* 10.6*  --  11.4* 16.1* 20.6*  --   HGB 11.6* 12.1 13.3 13.6 13.4 13.5 14.6  HCT 37.3 38.2 39.0 43.3 43.3 44.2 43.0  MCV 91.6 91.4  --  89.5 88.9 90.8  --   PLT 378 346  --  337 290 223  --     Basic Metabolic Panel: Recent Labs  Lab 10/30/18 0300 10/31/18 0330 10/31/18 0405  11/01/18 0420 11/02/18 0130 11/03/18 0140 11/03/18 0420  NA 138 132* 132* 134* 137 136 137  K 4.0 3.2* 3.2* 3.4* 3.2* 3.4* 3.3*  CL 100 89*  --  88* 85* 87*  --   CO2 29 30  --  30 39* 38*  --   GLUCOSE 96 106*  --  93 142* 124*  --   BUN 31* 37*  --  47* 61* 68*  --   CREATININE 0.62 0.79  --  0.81 1.03* 1.02*  --   CALCIUM 8.2* 8.0*  --  8.5* 8.7* 8.8*  --   MG  --  2.0  --  2.0 2.3  --   --   PHOS  --  4.0  --  4.5 4.0  --   --    GFR: Estimated Creatinine Clearance: 67.3 mL/min (A) (by C-G formula based on SCr of 1.02 mg/dL (H)). Recent Labs  Lab 10/31/18 0330 11/01/18 0420 11/02/18 0130  11/03/18 0140  WBC 10.6* 11.4* 16.1* 20.6*    Liver Function Tests: Recent Labs  Lab 10/29/18 0150 10/30/18 0300 10/31/18 0330 11/01/18 0420 11/02/18 0130  AST 44* 52* 54* 88* 73*  ALT 45* 47* 41 64* 67*  ALKPHOS 135* 133* 128* 150* 157*  BILITOT 0.1* 0.3 0.5 0.4 0.5  PROT 5.7* 5.4* 5.4* 6.1* 6.3*  ALBUMIN 2.4* 2.5* 2.6* 2.9* 2.9*   No results for input(s): LIPASE, AMYLASE in the last 168 hours. No results for input(s): AMMONIA in the last 168 hours.  ABG    Component Value Date/Time   PHART 7.499 (H) 11/03/2018 0420   PCO2ART 54.8 (H) 11/03/2018 0420   PO2ART 57.0 (L) 11/03/2018 0420   HCO3 42.6 (H) 11/03/2018 0420   TCO2 44 (H) 11/03/2018 0420   ACIDBASEDEF 1.0 10/24/2018 0419   O2SAT 91.0 11/03/2018 0420     Coagulation Profile: No results for input(s): INR, PROTIME in the last 168 hours.  Cardiac Enzymes: No results for input(s): CKTOTAL, CKMB, CKMBINDEX, TROPONINI in the last 168 hours.  HbA1C: No results found for: HGBA1C  CBG: Recent Labs  Lab 11/03/18 1238 11/03/18 1633 11/03/18 1938 11/03/18 2341 11/04/18 0350  GLUCAP 150* 165* 138* 114* 127*    Genelle Economou A, DO 11/04/2018, 6:09 AM Pager: [phone]

## 2018-11-04 NOTE — Progress Notes (Signed)
PROGRESS NOTE  Shannon Meyer YYQ:[id number] DOB: 04/05/1966 DOA: 10/22/2018 PCP: No primary care provider on file.   LOS: 13 days   Brief Narrative / Interim history: 53 year old female without significant past medical history admitted to the hospital on 10/22/2018 with 5-day history of weakness, cough, shortness of breath, she was found to be profoundly hypoxic in the emergency room and intubated.  She was found to be Covid 19+.  Patient has been relatively slow to wean, she is able to tolerate pressure support for few hours in the morning but then becomes tachypneic.  Subjective: -No significant events overnight, remains on the vent this morning.  Assessment & Plan: Principal Problem:   Pneumonia due to COVID-19 virus Active Problems:   Leukocytosis   Sepsis (HCC)   Elevated LFTs   QT prolongation   Acute respiratory failure with hypoxemia (HCC)   Principal Problem Acute Hypoxic Respiratory Failure due to Covid-19 Viral Illness -Remains on vent, weaning trials daily per critical care. She does well for a while then becomes tachypneic and needing to go back on vent support. -CT scan on admission without evidence of PE but with significant groundglass opacities bilaterally -Received Actemra 5/20, 5/21 -Status post Remdesivir as well as IV steroids, stopped 5/26 -Status post treatment with ceftriaxone and azithromycin as well. -Diurese again today with Lasix, renal function stable   Vent Mode: PSV;CPAP FiO2 (%):  [40 %-70 %] 50 % Set Rate:  [20 bmp] 20 bmp Vt Set:  [330 mL] 330 mL PEEP:  [5 cmH20-8 cmH20] 5 cmH20 Pressure Support:  [14 cmH20-15 cmH20] 15 cmH20 Plateau Pressure:  [24 cmH20-27 cmH20] 24 cmH20  COVID-19 Labs  Recent Labs    11/04/18 0655  DDIMER 1.18*    Lab Results  Component Value Date   SARSCOV2NAA POSITIVE (A) 10/22/2018   Active Problems Hypotension/hypothermia -On 5/30, patient is cortisol was checked and it was normal at 24, prior to results  she was placed on stress dose steroids which have been weaned off  Sepsis, present on admission -Resolved  Transaminitis -Resolved  QT prolongation -Noted to be 490 ms.  Avoid QT prolonging agents as much as possible.  Hypernatremia -Sodium normal 140 this morning  Hypokalemia -Replete today  Scheduled Meds: . bisacodyl  10 mg Oral Once  . chlorhexidine  15 mL Mouth/Throat BID  . Chlorhexidine Gluconate Cloth  6 each Topical Daily  . enoxaparin (LOVENOX) injection  40 mg Subcutaneous Q12H  . famotidine  20 mg Per Tube Q12H  . feeding supplement (PRO-STAT SUGAR FREE 64)  60 mL Per Tube Daily  . feeding supplement (VITAL HIGH PROTEIN)  1,000 mL Per Tube Q24H  . insulin aspart  0-9 Units Subcutaneous Q4H  . mouth rinse  15 mL Mouth Rinse 10 times per day  . multivitamin with minerals  1 tablet Per Tube Daily  . oxyCODONE  5 mg Per Tube Q8H  . potassium chloride  40 mEq Oral Q3H  . sodium chloride flush  10-40 mL Intracatheter Q12H  . sodium chloride flush  3 mL Intravenous Q12H   Continuous Infusions: . sodium chloride 10 mL/hr at 11/04/18 0800  . dexmedetomidine (PRECEDEX) IV infusion 0.2 mcg/kg/hr (11/04/18 0945)  . fentaNYL infusion INTRAVENOUS Stopped (11/01/18 1544)  . norepinephrine (LEVOPHED) Adult infusion 1 mcg/min (11/04/18 0800)   PRN Meds:.acetaminophen (TYLENOL) oral liquid 160 mg/5 mL, bisacodyl, docusate, fentaNYL, fentaNYL (SUBLIMAZE) injection, hydrALAZINE, midazolam, polyethylene glycol, sodium chloride flush  DVT prophylaxis: Lovenox Code Status: Full code Family Communication: PCCM  to update the family Disposition Plan: TBD  Consultants:   PCCM  Procedures:   5/20 >> ETT  5/24 >> R PICC  Antimicrobials: Zithromax 5/20 >>5/24 Rocephin 5/20 >>  Remdesivir 5/21 >   Actemra 5/20> 5/21   Objective: Vitals:   11/04/18 0709 11/04/18 0800 11/04/18 0852 11/04/18 0942  BP:  99/73    Pulse:  (!) 56    Resp:  20    Temp: 97.9 F (36.6 C)      TempSrc: Oral     SpO2:  97% 95% 94%  Weight:      Height:        Intake/Output Summary (Last 24 hours) at 11/04/2018 1116 Last data filed at 11/04/2018 0800 Gross per 24 hour  Intake 1462.03 ml  Output 640 ml  Net 822.03 ml   Filed Weights   11/02/18 0500 11/03/18 0500 11/04/18 0500  Weight: 91 kg 89.2 kg 89 kg    Examination:  Constitutional: Sedated on vent Eyes: No icterus ENMT: Dry membranes, ET tube in place Respiratory: No wheezing, no crackles, good excursion with event Cardiovascular: Regular rate and rhythm, no edema.  No murmurs. Abdomen: Soft, nondistended, bowel sounds positive Skin: No rashes Neurologic: Intubated and sedated   Data Reviewed: I have independently reviewed following labs and imaging studies   CBC: Recent Labs  Lab 10/31/18 0330  11/01/18 0420 11/02/18 0130 11/03/18 0140 11/03/18 0420 11/04/18 0655  WBC 10.6*  --  11.4* 16.1* 20.6*  --  14.6*  HGB 12.1   < > 13.6 13.4 13.5 14.6 12.7  HCT 38.2   < > 43.3 43.3 44.2 43.0 41.1  MCV 91.4  --  89.5 88.9 90.8  --  92.6  PLT 346  --  337 290 223  --  181   < > = values in this interval not displayed.   Basic Metabolic Panel: Recent Labs  Lab 10/31/18 0330  11/01/18 0420 11/02/18 0130 11/03/18 0140 11/03/18 0420 11/04/18 0655  NA 132*   < > 134* 137 136 137 140  K 3.2*   < > 3.4* 3.2* 3.4* 3.3* 3.1*  CL 89*  --  88* 85* 87*  --  95*  CO2 30  --  30 39* 38*  --  33*  GLUCOSE 106*  --  93 142* 124*  --  104*  BUN 37*  --  47* 61* 68*  --  65*  CREATININE 0.79  --  0.81 1.03* 1.02*  --  0.78  CALCIUM 8.0*  --  8.5* 8.7* 8.8*  --  8.6*  MG 2.0  --  2.0 2.3  --   --   --   PHOS 4.0  --  4.5 4.0  --   --   --    < > = values in this interval not displayed.   GFR: Estimated Creatinine Clearance: 85.8 mL/min (by C-G formula based on SCr of 0.78 mg/dL). Liver Function Tests: Recent Labs  Lab 10/30/18 0300 10/31/18 0330 11/01/18 0420 11/02/18 0130 11/04/18 0655  AST 52* 54* 88*  73* 38  ALT 47* 41 64* 67* 38  ALKPHOS 133* 128* 150* 157* 137*  BILITOT 0.3 0.5 0.4 0.5 0.5  PROT 5.4* 5.4* 6.1* 6.3* 5.2*  ALBUMIN 2.5* 2.6* 2.9* 2.9* 2.5*   No results for input(s): LIPASE, AMYLASE in the last 168 hours. No results for input(s): AMMONIA in the last 168 hours. Coagulation Profile: No results for input(s): INR, PROTIME in the last 168  hours. Cardiac Enzymes: No results for input(s): CKTOTAL, CKMB, CKMBINDEX, TROPONINI in the last 168 hours. BNP (last 3 results) No results for input(s): PROBNP in the last 8760 hours. HbA1C: No results for input(s): HGBA1C in the last 72 hours. CBG: Recent Labs  Lab 11/03/18 1938 11/03/18 2341 11/04/18 0350 11/04/18 0705 11/04/18 0735  GLUCAP 138* 114* 127* 112* 100*   Lipid Profile: No results for input(s): CHOL, HDL, LDLCALC, TRIG, CHOLHDL, LDLDIRECT in the last 72 hours. Thyroid Function Tests: No results for input(s): TSH, T4TOTAL, FREET4, T3FREE, THYROIDAB in the last 72 hours. Anemia Panel: No results for input(s): VITAMINB12, FOLATE, FERRITIN, TIBC, IRON, RETICCTPCT in the last 72 hours. Urine analysis:    Component Value Date/Time   COLORURINE STRAW (A) 11/01/2018 0901   APPEARANCEUR HAZY (A) 11/01/2018 0901   LABSPEC 1.010 11/01/2018 0901   PHURINE 5.0 11/01/2018 0901   GLUCOSEU NEGATIVE 11/01/2018 0901   HGBUR NEGATIVE 11/01/2018 0901   BILIRUBINUR NEGATIVE 11/01/2018 0901   KETONESUR NEGATIVE 11/01/2018 0901   PROTEINUR NEGATIVE 11/01/2018 0901   NITRITE NEGATIVE 11/01/2018 0901   LEUKOCYTESUR NEGATIVE 11/01/2018 0901   Sepsis Labs: Invalid input(s): PROCALCITONIN, LACTICIDVEN  No results found for this or any previous visit (from the past 240 hour(s)).    Radiology Studies: Dg Chest Port 1 View  Result Date: 11/04/2018 CLINICAL DATA:  Acute respiratory failure EXAM: PORTABLE CHEST 1 VIEW COMPARISON:  10/31/2018 FINDINGS: Cardiac shadow is stable. Endotracheal tube, right-sided PICC line and gastric  catheter are noted in satisfactory position. Multifocal infiltrates are identified bilaterally left greater than right stable from the prior exam. No bony abnormality is seen. IMPRESSION: Stable bilateral infiltrates Tubes and lines in satisfactory position. Electronically Signed   By: Alcide Clever M.D.   On: 11/04/2018 07:01   Pamella Pert, MD, PhD [facility]  Contact via  [email or web address]  [facility] Office Info P: [phone]  F: [phone]

## 2018-11-05 DIAGNOSIS — I9589 Other hypotension: Secondary | ICD-10-CM

## 2018-11-05 LAB — CBC
HCT: 42.4 % (ref 36.0–46.0)
Hemoglobin: 13 g/dL (ref 12.0–15.0)
MCH: 28.6 pg (ref 26.0–34.0)
MCHC: 30.7 g/dL (ref 30.0–36.0)
MCV: 93.2 fL (ref 80.0–100.0)
Platelets: 181 10*3/uL (ref 150–400)
RBC: 4.55 MIL/uL (ref 3.87–5.11)
RDW: 19.4 % — ABNORMAL HIGH (ref 11.5–15.5)
WBC: 14 10*3/uL — ABNORMAL HIGH (ref 4.0–10.5)
nRBC: 0 % (ref 0.0–0.2)

## 2018-11-05 LAB — BASIC METABOLIC PANEL
Anion gap: 8 (ref 5–15)
BUN: 70 mg/dL — ABNORMAL HIGH (ref 6–20)
CO2: 34 mmol/L — ABNORMAL HIGH (ref 22–32)
Calcium: 8.9 mg/dL (ref 8.9–10.3)
Chloride: 101 mmol/L (ref 98–111)
Creatinine, Ser: 0.88 mg/dL (ref 0.44–1.00)
GFR calc Af Amer: >60 mL/min (ref >60)
GFR calc non Af Amer: >60 mL/min (ref >60)
Glucose, Bld: 124 mg/dL — ABNORMAL HIGH (ref 70–99)
Potassium: 3.7 mmol/L (ref 3.5–5.1)
Sodium: 143 mmol/L (ref 135–145)

## 2018-11-05 LAB — GLUCOSE, CAPILLARY
Glucose-Capillary: 120 mg/dL — ABNORMAL HIGH (ref 70–99)
Glucose-Capillary: 124 mg/dL — ABNORMAL HIGH (ref 70–99)
Glucose-Capillary: 127 mg/dL — ABNORMAL HIGH (ref 70–99)
Glucose-Capillary: 132 mg/dL — ABNORMAL HIGH (ref 70–99)
Glucose-Capillary: 134 mg/dL — ABNORMAL HIGH (ref 70–99)
Glucose-Capillary: 138 mg/dL — ABNORMAL HIGH (ref 70–99)
Glucose-Capillary: 151 mg/dL — ABNORMAL HIGH (ref 70–99)

## 2018-11-05 LAB — LACTATE DEHYDROGENASE: LDH: 335 U/L — ABNORMAL HIGH (ref 98–192)

## 2018-11-05 LAB — C-REACTIVE PROTEIN: CRP: 1.2 mg/dL — ABNORMAL HIGH (ref <1.0)

## 2018-11-05 MED ORDER — ADULT MULTIVITAMIN LIQUID CH
15.0000 mL | Freq: Every day | ORAL | Status: DC
  Administered 2018-11-06 – 2018-11-29 (×24): 15 mL
  Filled 2018-11-05 (×25): qty 15

## 2018-11-05 MED ORDER — FUROSEMIDE 10 MG/ML IJ SOLN
40.0000 mg | Freq: Two times a day (BID) | INTRAMUSCULAR | Status: AC
  Administered 2018-11-05 (×2): 40 mg via INTRAVENOUS
  Filled 2018-11-05 (×2): qty 4

## 2018-11-05 MED ORDER — OXYCODONE HCL 5 MG/5ML PO SOLN
10.0000 mg | Freq: Three times a day (TID) | ORAL | Status: DC
  Administered 2018-11-05 – 2018-11-10 (×14): 10 mg
  Filled 2018-11-05 (×14): qty 10

## 2018-11-05 MED ORDER — POTASSIUM CHLORIDE 20 MEQ/15ML (10%) PO SOLN
40.0000 meq | Freq: Two times a day (BID) | ORAL | Status: AC
  Administered 2018-11-05: 21:00:00 40 meq
  Filled 2018-11-05: qty 30

## 2018-11-05 MED ORDER — POTASSIUM CHLORIDE 20 MEQ/15ML (10%) PO SOLN: 40.0000 meq | Freq: Two times a day (BID) | ORAL | Status: DC

## 2018-11-05 NOTE — Progress Notes (Signed)
Ladona Ridgel RN spoke with patient contact, Shannon Meyer, over phone to give update and answer questions.  Will call back when have answers regarding FMLA paperwork.

## 2018-11-05 NOTE — Progress Notes (Signed)
PROGRESS NOTE    Mlynn Weinkauf  EEF:[id number] DOB: 1966-05-29 DOA: 10/22/2018 PCP: No primary care provider on file.      Brief Narrative:  Shannon Meyer is a 53 y.o. F with no significant PMHx who presented with about 1 week of cough, SOB, and weakness.   In the ER, she was SARS-CoV NAA positive, CXR showed bilateral opacities.       Assessment & Plan:  Coronavirus pneumonitis with acute hypoxic respiratory failure In setting of ongoing 2020 COVID-19 pandemic.  S/p remdesivir S/p Actemra  Inflammatory markers trending down -VTE PPx with Lovenox, ICU dosing  -Daily d-dimer, ferritin and CRP -Repeat Lasix  COVID-19 Labs Recent Labs    11/04/18 0655 11/05/18 0444 11/05/18 0445  DDIMER 1.18*  --   --   LDH  --   --  335*  CRP  --  1.2*  --     Hyponatremia Resolved  Hypokalemia Improved  Positive blood culture CoNS in 1/2 cultures, likely contaminant.       MDM and disposition: The below labs and imaging reports were reviewed and summarized above.  Medication management as above.  The patient was admitted with acute hypoxic respiratory failure from COVID-19.    This is an acute illness (COVID) that poses threat to life, limb, bodily fcn     DVT prophylaxis: Lovenox Code Status: FULL     Consultants:   PCCM  Procedures:   5/20 ETT  5/24 R PICC line  Antimicrobials:   Azithromycin 5/20 >> 5/24  Rocephin 5/20 >> 5/26  Remdesivir 5/25 >> 5/30  Actemra 5/20 and 5/21  Diflucan 5/30 >> 6/1    Subjective: Intubated and sedated  Objective: Vitals:   11/05/18 1202 11/05/18 1215 11/05/18 1230 11/05/18 1245  BP: 112/85 96/62 109/69 119/77  Pulse: (!) 105 80 83 93  Resp: 20 (!) 22 (!) 32 (!) 26  Temp:      TempSrc:      SpO2: 94% 93% 92% 91%  Weight:      Height:        Intake/Output Summary (Last 24 hours) at 11/05/2018 1301 Last data filed at 11/05/2018 1237 Gross per 24 hour  Intake 1496.22 ml  Output 2400 ml  Net  -903.78 ml   Filed Weights   11/03/18 0500 11/04/18 0500 11/05/18 0500  Weight: 89.2 kg 89 kg 91.5 kg    Examination: General appearance:  adult female, intubated on ventilator HEENT: Anicteric, conjunctiva pink, lids and lashes normal. No nasal deformity, discharge, epistaxis.  Lips moist.   Skin: Warm and dry.  no jaundice.  No suspicious rashes or lesions. Cardiac: RRR, nl S1-S2, no murmurs appreciated.  Capillary refill is brisk.  JVPnot visible.  No LE edema.  Radia pulses 2+ and symmetric. Respiratory: Normal respiratory rate and rhythm.  CTAB without rales or wheezes. Abdomen: Abdomen soft.  no TTP. No ascites, distension, hepatosplenomegaly.   MSK: No deformities or effusions. Neuro: intubated and sedated.  moves all extremities.Follows commands Psych:      Data Reviewed: I have personally reviewed following labs and imaging studies:  CBC: Recent Labs  Lab 11/01/18 0420 11/02/18 0130 11/03/18 0140 11/03/18 0420 11/04/18 0655 11/05/18 0445  WBC 11.4* 16.1* 20.6*  --  14.6* 14.0*  HGB 13.6 13.4 13.5 14.6 12.7 13.0  HCT 43.3 43.3 44.2 43.0 41.1 42.4  MCV 89.5 88.9 90.8  --  92.6 93.2  PLT 337 290 223  --  181 181   Basic  Metabolic Panel: Recent Labs  Lab 10/31/18 0330  11/01/18 0420 11/02/18 0130 11/03/18 0140 11/03/18 0420 11/04/18 0655 11/05/18 0445  NA 132*   < > 134* 137 136 137 140 143  K 3.2*   < > 3.4* 3.2* 3.4* 3.3* 3.1* 3.7  CL 89*  --  88* 85* 87*  --  95* 101  CO2 30  --  30 39* 38*  --  33* 34*  GLUCOSE 106*  --  93 142* 124*  --  104* 124*  BUN 37*  --  47* 61* 68*  --  65* 70*  CREATININE 0.79  --  0.81 1.03* 1.02*  --  0.78 0.88  CALCIUM 8.0*  --  8.5* 8.7* 8.8*  --  8.6* 8.9  MG 2.0  --  2.0 2.3  --   --   --   --   PHOS 4.0  --  4.5 4.0  --   --   --   --    < > = values in this interval not displayed.   GFR: Estimated Creatinine Clearance: 79.1 mL/min (by C-G formula based on SCr of 0.88 mg/dL). Liver Function Tests: Recent Labs   Lab 10/30/18 0300 10/31/18 0330 11/01/18 0420 11/02/18 0130 11/04/18 0655  AST 52* 54* 88* 73* 38  ALT 47* 41 64* 67* 38  ALKPHOS 133* 128* 150* 157* 137*  BILITOT 0.3 0.5 0.4 0.5 0.5  PROT 5.4* 5.4* 6.1* 6.3* 5.2*  ALBUMIN 2.5* 2.6* 2.9* 2.9* 2.5*   No results for input(s): LIPASE, AMYLASE in the last 168 hours. No results for input(s): AMMONIA in the last 168 hours. Coagulation Profile: No results for input(s): INR, PROTIME in the last 168 hours. Cardiac Enzymes: No results for input(s): CKTOTAL, CKMB, CKMBINDEX, TROPONINI in the last 168 hours. BNP (last 3 results) No results for input(s): PROBNP in the last 8760 hours. HbA1C: No results for input(s): HGBA1C in the last 72 hours. CBG: Recent Labs  Lab 11/04/18 1944 11/04/18 2325 11/05/18 0308 11/05/18 0819 11/05/18 1127  GLUCAP 120* 119* 132* 138* 127*   Lipid Profile: No results for input(s): CHOL, HDL, LDLCALC, TRIG, CHOLHDL, LDLDIRECT in the last 72 hours. Thyroid Function Tests: No results for input(s): TSH, T4TOTAL, FREET4, T3FREE, THYROIDAB in the last 72 hours. Anemia Panel: No results for input(s): VITAMINB12, FOLATE, FERRITIN, TIBC, IRON, RETICCTPCT in the last 72 hours. Urine analysis:    Component Value Date/Time   COLORURINE STRAW (A) 11/01/2018 0901   APPEARANCEUR HAZY (A) 11/01/2018 0901   LABSPEC 1.010 11/01/2018 0901   PHURINE 5.0 11/01/2018 0901   GLUCOSEU NEGATIVE 11/01/2018 0901   HGBUR NEGATIVE 11/01/2018 0901   BILIRUBINUR NEGATIVE 11/01/2018 0901   KETONESUR NEGATIVE 11/01/2018 0901   PROTEINUR NEGATIVE 11/01/2018 0901   NITRITE NEGATIVE 11/01/2018 0901   LEUKOCYTESUR NEGATIVE 11/01/2018 0901   Sepsis Labs: (procalcitonin:4,lacticacidven:4)  )No results found for this or any previous visit (from the past 240 hour(s)).       Radiology Studies: Dg Chest Port 1 View  Result Date: 11/04/2018 CLINICAL DATA:  Acute respiratory failure EXAM: PORTABLE CHEST 1 VIEW  COMPARISON:  10/31/2018 FINDINGS: Cardiac shadow is stable. Endotracheal tube, right-sided PICC line and gastric catheter are noted in satisfactory position. Multifocal infiltrates are identified bilaterally left greater than right stable from the prior exam. No bony abnormality is seen. IMPRESSION: Stable bilateral infiltrates Tubes and lines in satisfactory position. Electronically Signed   By: Alcide Clever M.D.   On: 11/04/2018 07:01  Scheduled Meds: . bisacodyl  10 mg Oral Once  . chlorhexidine  15 mL Mouth/Throat BID  . Chlorhexidine Gluconate Cloth  6 each Topical Daily  . enoxaparin (LOVENOX) injection  40 mg Subcutaneous Q12H  . famotidine  20 mg Per Tube Q12H  . feeding supplement (PRO-STAT SUGAR FREE 64)  60 mL Per Tube Daily  . feeding supplement (VITAL HIGH PROTEIN)  1,000 mL Per Tube Q24H  . furosemide  40 mg Intravenous BID  . insulin aspart  0-9 Units Subcutaneous Q4H  . mouth rinse  15 mL Mouth Rinse 10 times per day  . multivitamin  15 mL Per Tube Daily  . oxyCODONE  5 mg Per Tube Q8H  . potassium chloride  40 mEq Per Tube BID  . sodium chloride flush  10-40 mL Intracatheter Q12H  . sodium chloride flush  3 mL Intravenous Q12H   Continuous Infusions: . sodium chloride 10 mL/hr at 11/05/18 1100  . dexmedetomidine (PRECEDEX) IV infusion 0.8 mcg/kg/hr (11/05/18 1237)  . fentaNYL infusion INTRAVENOUS Stopped (11/01/18 1544)  . norepinephrine (LEVOPHED) Adult infusion 2 mcg/min (11/05/18 1139)     LOS: 14 days    Time spent: 35 minutes  During this encounter: Patient Isolation: Airborne, contact, droplet HCP PPE: Face shield, head covering, N95, gown, gloves, and shoe covers    Alberteen Samhristopher P Ludwika Rodd, MD [facility] 11/05/2018, 1:01 PM     Please page through AMION:  [email or web address] Password [email or web address] If 7PM-7AM, please contact night-coverage

## 2018-11-05 NOTE — Progress Notes (Signed)
PCCM Interval Note  I updated patient's daughter, [name]. Her mother continues to tolerate up to 2 hours of pressure support daily. She has been on mechanical ventilation for 13 days. We discussed pros vs cons of tracheostomy. Daughter wished to pursue tracheostomy as her mother was previously independent prior to this illness.  Mechele Collin, M.D. [facility] Pulmonary/Critical Care Medicine Pager: [phone] After hours pager: [phone]

## 2018-11-05 NOTE — Progress Notes (Signed)
Nutrition Follow-up  DOCUMENTATION CODES:   Obesity unspecified  INTERVENTION:   Continue TF via OGT:    Vital AF 1.2 at 40 ml/h   Pro-stat 60 ml once daily   Provides 1160 kcal, 114 gm protein, 803 ml free water daily  NUTRITION DIAGNOSIS:   Inadequate oral intake related to inability to eat as evidenced by NPO status.  Ongoing   GOAL:   Provide needs based on ASPEN/SCCM guidelines  Met  MONITOR:   Vent status, TF tolerance, Labs, Skin  ASSESSMENT:   53 yo female with no significant PMH who was admitted with COVID-19.  Patient remains intubated on ventilator support. Slow to wean. On one pressor.  MV: 6.7 L/min Temp (24hrs), Avg:98.4 F (36.9 C), Min:97.9 F (36.6 C), Max:99.2 F (37.3 C)  Propofol: off   Receiving Vital High Protein at 40 ml/h with Pro-stat 60 ml once daily. Tolerating TF well, meeting nutrition needs.  Labs reviewed. CBG's: 132-138 this morning  Medications reviewed and include Lasix, Novolog, MVI, Levophed.  IVF: NS at 10 ml/h  I/O -8.8 L since admission Weight down 8.3 kg since admission  Diet Order:   Diet Order            Diet NPO time specified  Diet effective now              EDUCATION NEEDS:   No education needs have been identified at this time  Skin:  Skin Assessment: Reviewed RN Assessment  Last BM:  6/2 (type 6)  Height:   Ht Readings from Last 1 Encounters:  10/22/18 '5\' 4"'  (1.626 m)    Weight:   Wt Readings from Last 1 Encounters:  11/05/18 91.5 kg    Ideal Body Weight:  54.5 kg  BMI:  Body mass index is 34.63 kg/m.  Estimated Nutritional Needs:   Kcal:  5612-5483  Protein:  109 gm  Fluid:  >/= 1.6 L    Molli Barrows, RD, LDN, [place] Pager [phone] After Hours Pager [phone]

## 2018-11-05 NOTE — Progress Notes (Signed)
NAME:  Marlena Duggin, MRN:  [id number], DOB:  November 23, 1965, LOS: 14 ADMISSION DATE:  10/22/2018, CONSULTATION DATE:  Oct 22, 2018 REFERRING MD:  Pilar Plate, CHIEF COMPLAINT:  Dyspnea   Brief History   53 y/o female with no PMH admitted from [facility] on 10/04/2018 with acute respiratory failure with hypoxemia due to COVID-19 pneumonia.  Past Medical History  No known past medical history  Significant Hospital Events   5/20 Intubated in ED and admitted to [facility] ICU 5/21-5/26 Weaning FIO2 and PEEP, diuresing 5/26-6/3 Tolerating weaning for 1-2 hours Consults:  Pulmonary and critical care medicine  Procedures:  5/20 ET tube>  5/24 PICC line right arm>   Significant Diagnostic Tests:  Oct 28, 2018 CT angiogram chest no pulmonary embolism, bilateral atypical pneumonia consistent with COVID-19  Micro Data:  SARS-CoV-2 May 20+ Blood culture May 20 1 out of 2 GPC staph, coag negative  Antimicrobials:  Zithromax 5/20 >>5/24 Rocephin 5/20 >>5/26  Remdesivir 5/21 > 5/30  Actemra 5/20> 5/21  Interim history/subjective:  Tolerated PS for 2 hours. Became agitated, tachycardic and tachypneic Follows commands  Objective   Blood pressure (!) 85/52, pulse 80, temperature 98.2 F (36.8 C), temperature source Axillary, resp. rate (!) 25, height 5\' 4"  (1.626 m), weight 91.5 kg, SpO2 95 %.    Vent Mode: PRVC FiO2 (%):  [40 %-50 %] 40 % Set Rate:  [20 bmp] 20 bmp Vt Set:  [330 mL] 330 mL PEEP:  [5 cmH20] 5 cmH20 Pressure Support:  [16 cmH20-18 cmH20] 16 cmH20 Plateau Pressure:  [19 cmH20-27 cmH20] 24 cmH20   Intake/Output Summary (Last 24 hours) at 11/05/2018 1154 Last data filed at 11/05/2018 0800 Gross per 24 hour  Intake 1241.9 ml  Output 1600 ml  Net -358.1 ml   Filed Weights   11/03/18 0500 11/04/18 0500 11/05/18 0500  Weight: 89.2 kg 89 kg 91.5 kg   Physical Exam: General: Critically ill-appearing, no acute distress HENT: [place], AT, ETT in place Eyes: EOMI, no scleral  icterus Respiratory: Mild bilateral rhonchi. No wheezing Cardiovascular: RRR, -M/R/G, no JVD GI: BS+, soft, nontender Extremities:-Edema,-tenderness Neuro: AAO x4, CNII-XII grossly intact  CXR 6/2 reviewed and interpreted by me: Bilateral infiltrates with left-side atelectasis, unchanged compared to 5/29.  Resolved Hospital Problem list     Assessment & Plan:   Acute respiratory failure with hypoxemia due to COVID-19: Slowly weaning Completed Tocilizumab  Completed Remdesivir  Continue pressure support as tolerated Vent settings per ARDSnet Diurese VAP Weight-based lovenox PT/OT evaluation  Hypotension secondary to sedation Wean levophed for MAP goal >65  Best practice:  Diet: tube feeding Pain/Anxiety/Delirium protocol (if indicated): Yes, RASS score -1 VAP protocol (if indicated): yes DVT prophylaxis: lovenox GI prophylaxis:  famoditine Glucose control: per TRH Mobility: bedrest Code Status: Full Family Communication: I updated her daughter [name] on 6/1 Disposition: remain in ICU  Labs   CBC: Recent Labs  Lab 11/01/18 0420 11/02/18 0130 11/03/18 0140 11/03/18 0420 11/04/18 0655 11/05/18 0445  WBC 11.4* 16.1* 20.6*  --  14.6* 14.0*  HGB 13.6 13.4 13.5 14.6 12.7 13.0  HCT 43.3 43.3 44.2 43.0 41.1 42.4  MCV 89.5 88.9 90.8  --  92.6 93.2  PLT 337 290 223  --  181 181    Basic Metabolic Panel: Recent Labs  Lab 10/31/18 0330  11/01/18 0420 11/02/18 0130 11/03/18 0140 11/03/18 0420 11/04/18 0655 11/05/18 0445  NA 132*   < > 134* 137 136 137 140 143  K 3.2*   < >  3.4* 3.2* 3.4* 3.3* 3.1* 3.7  CL 89*  --  88* 85* 87*  --  95* 101  CO2 30  --  30 39* 38*  --  33* 34*  GLUCOSE 106*  --  93 142* 124*  --  104* 124*  BUN 37*  --  47* 61* 68*  --  65* 70*  CREATININE 0.79  --  0.81 1.03* 1.02*  --  0.78 0.88  CALCIUM 8.0*  --  8.5* 8.7* 8.8*  --  8.6* 8.9  MG 2.0  --  2.0 2.3  --   --   --   --   PHOS 4.0  --  4.5 4.0  --   --   --   --    < > =  values in this interval not displayed.   GFR: Estimated Creatinine Clearance: 79.1 mL/min (by C-G formula based on SCr of 0.88 mg/dL). Recent Labs  Lab 11/02/18 0130 11/03/18 0140 11/04/18 0655 11/05/18 0445  WBC 16.1* 20.6* 14.6* 14.0*    Liver Function Tests: Recent Labs  Lab 10/30/18 0300 10/31/18 0330 11/01/18 0420 11/02/18 0130 11/04/18 0655  AST 52* 54* 88* 73* 38  ALT 47* 41 64* 67* 38  ALKPHOS 133* 128* 150* 157* 137*  BILITOT 0.3 0.5 0.4 0.5 0.5  PROT 5.4* 5.4* 6.1* 6.3* 5.2*  ALBUMIN 2.5* 2.6* 2.9* 2.9* 2.5*   No results for input(s): LIPASE, AMYLASE in the last 168 hours. No results for input(s): AMMONIA in the last 168 hours.  ABG    Component Value Date/Time   PHART 7.499 (H) 11/03/2018 0420   PCO2ART 54.8 (H) 11/03/2018 0420   PO2ART 57.0 (L) 11/03/2018 0420   HCO3 42.6 (H) 11/03/2018 0420   TCO2 44 (H) 11/03/2018 0420   ACIDBASEDEF 1.0 10/24/2018 0419   O2SAT 91.0 11/03/2018 0420     Coagulation Profile: No results for input(s): INR, PROTIME in the last 168 hours.  Cardiac Enzymes: No results for input(s): CKTOTAL, CKMB, CKMBINDEX, TROPONINI in the last 168 hours.  HbA1C: No results found for: HGBA1C  CBG: Recent Labs  Lab 11/04/18 1944 11/04/18 2325 11/05/18 0308 11/05/18 0819 11/05/18 1127  GLUCAP 120* 119* 132* 138* 127*     Critical care time: 33 minutes    The patient is critically ill with multiple organ systems failure and requires high complexity decision making for assessment and support, frequent evaluation and titration of therapies, application of advanced monitoring technologies and extensive interpretation of multiple databases.   Discussed and co-managed patient care with PCCM-Hospitalist. Coordinated care with RT, RN and pharmacist.  Mechele CollinJane Anushri Casalino, M.D. [facility] Pulmonary/Critical [facility] Medicine Pager: [phone] After hours pager: [phone]

## 2018-11-05 NOTE — Progress Notes (Addendum)
Spoke to and updated Patients daughter and answered her questions.

## 2018-11-06 LAB — COMPREHENSIVE METABOLIC PANEL
ALT: 34 U/L (ref 0–44)
AST: 31 U/L (ref 15–41)
Albumin: 2.9 g/dL — ABNORMAL LOW (ref 3.5–5.0)
Alkaline Phosphatase: 112 U/L (ref 38–126)
Anion gap: 10 (ref 5–15)
BUN: 68 mg/dL — ABNORMAL HIGH (ref 6–20)
CO2: 36 mmol/L — ABNORMAL HIGH (ref 22–32)
Calcium: 9 mg/dL (ref 8.9–10.3)
Chloride: 100 mmol/L (ref 98–111)
Creatinine, Ser: 0.75 mg/dL (ref 0.44–1.00)
GFR calc Af Amer: >60 mL/min (ref >60)
GFR calc non Af Amer: >60 mL/min (ref >60)
Glucose, Bld: 126 mg/dL — ABNORMAL HIGH (ref 70–99)
Potassium: 3.5 mmol/L (ref 3.5–5.1)
Sodium: 146 mmol/L — ABNORMAL HIGH (ref 135–145)
Total Bilirubin: 0.5 mg/dL (ref 0.3–1.2)
Total Protein: 6.2 g/dL — ABNORMAL LOW (ref 6.5–8.1)

## 2018-11-06 LAB — CBC WITH DIFFERENTIAL/PLATELET
Abs Immature Granulocytes: 0.1 10*3/uL — ABNORMAL HIGH (ref 0.00–0.07)
Basophils Absolute: 0 10*3/uL (ref 0.0–0.1)
Basophils Relative: 0 %
Eosinophils Absolute: 0.3 10*3/uL (ref 0.0–0.5)
Eosinophils Relative: 2 %
HCT: 43.2 % (ref 36.0–46.0)
Hemoglobin: 13.1 g/dL (ref 12.0–15.0)
Immature Granulocytes: 1 %
Lymphocytes Relative: 12 %
Lymphs Abs: 1.7 10*3/uL (ref 0.7–4.0)
MCH: 28.4 pg (ref 26.0–34.0)
MCHC: 30.3 g/dL (ref 30.0–36.0)
MCV: 93.5 fL (ref 80.0–100.0)
Monocytes Absolute: 1.9 10*3/uL — ABNORMAL HIGH (ref 0.1–1.0)
Monocytes Relative: 14 %
Neutro Abs: 9.9 10*3/uL — ABNORMAL HIGH (ref 1.7–7.7)
Neutrophils Relative %: 71 %
Platelets: 176 10*3/uL (ref 150–400)
RBC: 4.62 MIL/uL (ref 3.87–5.11)
RDW: 19.7 % — ABNORMAL HIGH (ref 11.5–15.5)
WBC: 13.9 10*3/uL — ABNORMAL HIGH (ref 4.0–10.5)
nRBC: 0 % (ref 0.0–0.2)

## 2018-11-06 LAB — GLUCOSE, CAPILLARY
Glucose-Capillary: 120 mg/dL — ABNORMAL HIGH (ref 70–99)
Glucose-Capillary: 139 mg/dL — ABNORMAL HIGH (ref 70–99)
Glucose-Capillary: 111 mg/dL — ABNORMAL HIGH (ref 70–99)
Glucose-Capillary: 117 mg/dL — ABNORMAL HIGH (ref 70–99)
Glucose-Capillary: 144 mg/dL — ABNORMAL HIGH (ref 70–99)
Glucose-Capillary: 102 mg/dL — ABNORMAL HIGH (ref 70–99)

## 2018-11-06 LAB — FERRITIN: Ferritin: 119 ng/mL (ref 11–307)

## 2018-11-06 LAB — C-REACTIVE PROTEIN: CRP: 1.1 mg/dL — ABNORMAL HIGH (ref <1.0)

## 2018-11-06 LAB — LACTATE DEHYDROGENASE: LDH: 344 U/L — ABNORMAL HIGH (ref 98–192)

## 2018-11-06 LAB — D-DIMER, QUANTITATIVE: D-Dimer, Quant: 0.78 ug/mL-FEU — ABNORMAL HIGH (ref 0.00–0.50)

## 2018-11-06 MED ORDER — METHYLPREDNISOLONE SODIUM SUCC 40 MG IJ SOLR: 40.0000 mg | Freq: Two times a day (BID) | INTRAMUSCULAR | Status: DC

## 2018-11-06 MED ORDER — CLONAZEPAM 0.125 MG PO TBDP
0.1250 mg | ORAL_TABLET | Freq: Two times a day (BID) | ORAL | Status: DC
  Administered 2018-11-06 – 2018-11-07 (×3): 0.125 mg via ORAL
  Filled 2018-11-06 (×3): qty 1

## 2018-11-06 MED ORDER — POTASSIUM CHLORIDE 20 MEQ/15ML (10%) PO SOLN
40.0000 meq | Freq: Once | ORAL | Status: AC
  Administered 2018-11-06: 08:00:00 40 meq
  Filled 2018-11-06: qty 30

## 2018-11-06 MED ORDER — FUROSEMIDE 40 MG PO TABS
40.0000 mg | ORAL_TABLET | Freq: Every day | ORAL | Status: DC
  Administered 2018-11-06 – 2018-11-17 (×12): 40 mg via ORAL
  Filled 2018-11-06 (×12): qty 1

## 2018-11-06 MED ORDER — ZINC SULFATE 220 (50 ZN) MG PO CAPS: 220.0000 mg | ORAL_CAPSULE | Freq: Every day | ORAL | Status: DC

## 2018-11-06 MED ORDER — VITAMIN C 500 MG/5ML PO SYRP: 500.0000 mg | ORAL_SOLUTION | Freq: Every day | ORAL | Status: DC

## 2018-11-06 NOTE — Progress Notes (Signed)
PCCM Interval Note  Called and updated patient's daughter, [name], via telephone. She agreed to continue weaning trials and is ok with patient receiving trach if needed.  Mechele Collin, M.D. [name] Pulmonary/Critical Care Medicine Pager: [phone] After hours pager: [phone]

## 2018-11-06 NOTE — Progress Notes (Signed)
Patient's daughter called and was updated on how the patient was doing. Her questions were answered.

## 2018-11-06 NOTE — Progress Notes (Signed)
NAME:  Shannon Meyer, MRN:  [id number], DOB:  1965/11/27, LOS: 15 ADMISSION DATE:  10/22/2018, CONSULTATION DATE:  Oct 22, 2018 REFERRING MD:  Pilar Plate, CHIEF COMPLAINT:  Dyspnea   Brief History   53 y/o female with no PMH admitted from [facility] on 10/04/2018 with acute respiratory failure with hypoxemia due to COVID-19 pneumonia.  Past Medical History  No known past medical history  Significant Hospital Events   5/20 Intubated in ED and admitted to [facility] ICU 5/21-5/26 Weaning FIO2 and PEEP, diuresing 5/26-6/3 Tolerating weaning for 1-2 hours Consults:  Pulmonary and critical care medicine  Procedures:  5/20 ET tube>  5/24 PICC line right arm>   Significant Diagnostic Tests:  Oct 28, 2018 CT angiogram chest no pulmonary embolism, bilateral atypical pneumonia consistent with COVID-19  Micro Data:  SARS-CoV-2 May 20+ Blood culture May 20 1 out of 2 GPC staph, coag negative  Antimicrobials:  Zithromax 5/20 >>5/24 Rocephin 5/20 >>5/26  Remdesivir 5/21 > 5/30  Actemra 5/20> 5/21  Interim history/subjective:  Tolerating PS with shallow breathing. Following commands.   Objective   Blood pressure 128/82, pulse 86, temperature 97.8 F (36.6 C), temperature source Oral, resp. rate (!) 31, height 5\' 4"  (1.626 m), weight 88.3 kg, SpO2 95 %.    Vent Mode: PRVC FiO2 (%):  [40 %] 40 % Set Rate:  [20 bmp] 20 bmp Vt Set:  [330 mL] 330 mL PEEP:  [5 cmH20] 5 cmH20 Pressure Support:  [16 cmH20-18 cmH20] 16 cmH20 Plateau Pressure:  [20 cmH20-26 cmH20] 23 cmH20   Intake/Output Summary (Last 24 hours) at 11/06/2018 0729 Last data filed at 11/06/2018 0600 Gross per 24 hour  Intake 1752.45 ml  Output 1800 ml  Net -47.55 ml   Filed Weights   11/04/18 0500 11/05/18 0500 11/06/18 0500  Weight: 89 kg 91.5 kg 88.3 kg   Physical Exam: General: Critically ill-appearing, NAD HENT: [place], AT, ETT in place Eyes: EOMI, no icterus or injection Respiratory: shallow breathing. CTAB. No  wheezing Cardiovascular: RRR, -M/R/G, no JVD GI: soft, nontender, non-distended Extremities: no pitting edema, moving extremities Neuro: following commands, alert  CXR 6/2 reviewed and interpreted by me: Bilateral infiltrates with left-side atelectasis, unchanged compared to 5/29.  Resolved Hospital Problem list     Assessment & Plan:   Acute respiratory failure with hypoxemia due to COVID-19: Slowly weaning Intubated since 5/20. Discussed with daughter yesterday, and possible trach tomorrow.  Completed Tocilizumab and remdesivir  Continue pressure support as tolerated Increase VT to 8cc/kg Vent settings per ARDSnet Cont. Lasix 40 mg po - repleting K Increase oxy to 10 mg q6h VAP Weight-based lovenox Cont. PT/OT   Hypotension secondary to sedation Wean levophed for MAP goal >65  Best practice:  Diet: tube feeds Pain/Anxiety/Delirium protocol (if indicated): Yes, RASS score -1 VAP protocol (if indicated): yes DVT prophylaxis: lovenox  GI prophylaxis:  famoditine Glucose control: per TRH Mobility: bedrest Code Status: Full Family Communication: daughter [name] updated 6/3 Disposition: remain in ICU  Labs   CBC: Recent Labs  Lab 11/02/18 0130 11/03/18 0140 11/03/18 0420 11/04/18 0655 11/05/18 0445 11/06/18 0230  WBC 16.1* 20.6*  --  14.6* 14.0* 13.9*  NEUTROABS  --   --   --   --   --  9.9*  HGB 13.4 13.5 14.6 12.7 13.0 13.1  HCT 43.3 44.2 43.0 41.1 42.4 43.2  MCV 88.9 90.8  --  92.6 93.2 93.5  PLT 290 223  --  181 181 176  Basic Metabolic Panel: Recent Labs  Lab 10/31/18 0330  11/01/18 0420 11/02/18 0130 11/03/18 0140 11/03/18 0420 11/04/18 0655 11/05/18 0445 11/06/18 0230  NA 132*   < > 134* 137 136 137 140 143 146*  K 3.2*   < > 3.4* 3.2* 3.4* 3.3* 3.1* 3.7 3.5  CL 89*  --  88* 85* 87*  --  95* 101 100  CO2 30  --  30 39* 38*  --  33* 34* 36*  GLUCOSE 106*  --  93 142* 124*  --  104* 124* 126*  BUN 37*  --  47* 61* 68*  --  65* 70* 68*   CREATININE 0.79  --  0.81 1.03* 1.02*  --  0.78 0.88 0.75  CALCIUM 8.0*  --  8.5* 8.7* 8.8*  --  8.6* 8.9 9.0  MG 2.0  --  2.0 2.3  --   --   --   --   --   PHOS 4.0  --  4.5 4.0  --   --   --   --   --    < > = values in this interval not displayed.   GFR: Estimated Creatinine Clearance: 85.4 mL/min (by C-G formula based on SCr of 0.75 mg/dL). Recent Labs  Lab 11/03/18 0140 11/04/18 0655 11/05/18 0445 11/06/18 0230  WBC 20.6* 14.6* 14.0* 13.9*    Liver Function Tests: Recent Labs  Lab 10/31/18 0330 11/01/18 0420 11/02/18 0130 11/04/18 0655 11/06/18 0230  AST 54* 88* 73* 38 31  ALT 41 64* 67* 38 34  ALKPHOS 128* 150* 157* 137* 112  BILITOT 0.5 0.4 0.5 0.5 0.5  PROT 5.4* 6.1* 6.3* 5.2* 6.2*  ALBUMIN 2.6* 2.9* 2.9* 2.5* 2.9*   No results for input(s): LIPASE, AMYLASE in the last 168 hours. No results for input(s): AMMONIA in the last 168 hours.  ABG    Component Value Date/Time   PHART 7.499 (H) 11/03/2018 0420   PCO2ART 54.8 (H) 11/03/2018 0420   PO2ART 57.0 (L) 11/03/2018 0420   HCO3 42.6 (H) 11/03/2018 0420   TCO2 44 (H) 11/03/2018 0420   ACIDBASEDEF 1.0 10/24/2018 0419   O2SAT 91.0 11/03/2018 0420     Coagulation Profile: No results for input(s): INR, PROTIME in the last 168 hours.  Cardiac Enzymes: No results for input(s): CKTOTAL, CKMB, CKMBINDEX, TROPONINI in the last 168 hours.  HbA1C: No results found for: HGBA1C  CBG: Recent Labs  Lab 11/05/18 1127 11/05/18 1526 11/05/18 1930 11/05/18 2345 11/06/18 0421  GLUCAP 127* 151* 124* 134* 120*     Critical care time:

## 2018-11-06 NOTE — Progress Notes (Signed)
Patient's daughter [name] called and updated over the phone. She is aware of potential for trach within the next few days as long as she is in agreeance. Questions answered.

## 2018-11-06 NOTE — Progress Notes (Signed)
PROGRESS NOTE    Shannon Meyer  VVZ:[id number] DOB: 12-04-1965 DOA: 10/22/2018 PCP: No primary care provider on file.      Brief Narrative:  Shannon Meyer is a 53 y.o. F with no significant PMHx who presented with about 1 week of cough, SOB, and weakness.   In the ER, she was SARS-CoV NAA positive, CXR showed bilateral opacities.  Intubated in the ER and transferred to [facility].      Assessment & Plan:  Respiratory failure Coronavirus pneumonitis with acute hypoxic respiratory failure In setting of ongoing 2020 COVID-19 pandemic.  S/p remdesivir S/p Actemra  Inflammatory markers still trending down.  FiO2 to 40%.  However very low tidal volumes with sedation holiday.   -Continue VTE prophylaxis with Lovenox -Start oral Lasix daily -Supplement sedation with oral oxycodone 10 mg q6h -Critical care will discuss tracheostomy with Dr. Molli Knock  COVID-19 Labs Recent Labs    11/04/18 5449 11/05/18 0444 11/05/18 0445 11/06/18 0230  DDIMER 1.18*  --   --  0.78*  FERRITIN  --   --   --  119  LDH  --   --  335* 344*  CRP  --  1.2*  --  1.1*    Hyponatremia Hypernatremia Hyponatremia has resolved.  We will tolerate some degree of hypernatremia due to need for diuresis.  Hypokalemia -Supplement K  Positive blood culture CoNS in 1/2 cultures, likely contaminant.       MDM and disposition: The below labs and imaging reports were reviewed and summarized above.  Medication management as above.  The patient was admitted with acute hypoxic respiratory failure from COVID-19 requiring mechanical ventilation.    She remains critically ill.  This encounter involved a high degree of medical risk and an extensive number of diagnoses were considered and managed and extensive complexity of data managed.    DVT prophylaxis: Lovenox Code Status: FULL     Consultants:   PCCM  Procedures:   5/20 ETT  5/24 R PICC line  Antimicrobials:   Azithromycin 5/20 >>  5/24  Rocephin 5/20 >> 5/26  Remdesivir 5/25 >> 5/30  Actemra 5/20 and 5/21  Diflucan 5/30 >> 6/1    Subjective: Intubated and sedated.  No new fever, no new respiratory change.  Still weak volumes when SBT.  Objective: Vitals:   11/06/18 0845 11/06/18 0900 11/06/18 0915 11/06/18 0930  BP: (!) 83/52 96/67 109/69 111/71  Pulse: 88 62 72 68  Resp: 20 20 (!) 22 20  Temp:      TempSrc:      SpO2: 96% 96% 97% 98%  Weight:      Height:        Intake/Output Summary (Last 24 hours) at 11/06/2018 0948 Last data filed at 11/06/2018 0932 Gross per 24 hour  Intake 1789.65 ml  Output 1800 ml  Net -10.35 ml   Filed Weights   11/04/18 0500 11/05/18 0500 11/06/18 0500  Weight: 89 kg 91.5 kg 88.3 kg    Examination: General appearance: Adult female, intubated and sedated HEENT: Anicteric, conjunctival pink, lids and lashes normal.  No nasal deformity, discharge, or epistaxis.  Lips moist, ET tube in place, bite-block in place. Skin: Warm and dry, no suspicious rashes or lesions. Cardiac: Regular rate and rhythm, JVP not visible, no lower extremity edema.  Murmurs appreciated. Respiratory: Normal respiratory rate and rhythm on ventilator.  ET tube in place.  Lungs clear bilaterally, diminished bilaterally. Abdomen: Abdomen soft, no tenderness to palpation or grimace. MSK:  No deformities or effusions. Neuro: Intubated and sedated.  Moves all extremities and responds to commands.  Makes eye contact.  Makes purposeful movements. Psych:      Data Reviewed: I have personally reviewed following labs and imaging studies:  CBC: Recent Labs  Lab 11/02/18 0130 11/03/18 0140 11/03/18 0420 11/04/18 0655 11/05/18 0445 11/06/18 0230  WBC 16.1* 20.6*  --  14.6* 14.0* 13.9*  NEUTROABS  --   --   --   --   --  9.9*  HGB 13.4 13.5 14.6 12.7 13.0 13.1  HCT 43.3 44.2 43.0 41.1 42.4 43.2  MCV 88.9 90.8  --  92.6 93.2 93.5  PLT 290 223  --  181 181 176   Basic Metabolic Panel: Recent Labs   Lab 10/31/18 0330  11/01/18 0420 11/02/18 0130 11/03/18 0140 11/03/18 0420 11/04/18 0655 11/05/18 0445 11/06/18 0230  NA 132*   < > 134* 137 136 137 140 143 146*  K 3.2*   < > 3.4* 3.2* 3.4* 3.3* 3.1* 3.7 3.5  CL 89*  --  88* 85* 87*  --  95* 101 100  CO2 30  --  30 39* 38*  --  33* 34* 36*  GLUCOSE 106*  --  93 142* 124*  --  104* 124* 126*  BUN 37*  --  47* 61* 68*  --  65* 70* 68*  CREATININE 0.79  --  0.81 1.03* 1.02*  --  0.78 0.88 0.75  CALCIUM 8.0*  --  8.5* 8.7* 8.8*  --  8.6* 8.9 9.0  MG 2.0  --  2.0 2.3  --   --   --   --   --   PHOS 4.0  --  4.5 4.0  --   --   --   --   --    < > = values in this interval not displayed.   GFR: Estimated Creatinine Clearance: 85.4 mL/min (by C-G formula based on SCr of 0.75 mg/dL). Liver Function Tests: Recent Labs  Lab 10/31/18 0330 11/01/18 0420 11/02/18 0130 11/04/18 0655 11/06/18 0230  AST 54* 88* 73* 38 31  ALT 41 64* 67* 38 34  ALKPHOS 128* 150* 157* 137* 112  BILITOT 0.5 0.4 0.5 0.5 0.5  PROT 5.4* 6.1* 6.3* 5.2* 6.2*  ALBUMIN 2.6* 2.9* 2.9* 2.5* 2.9*   No results for input(s): LIPASE, AMYLASE in the last 168 hours. No results for input(s): AMMONIA in the last 168 hours. Coagulation Profile: No results for input(s): INR, PROTIME in the last 168 hours. Cardiac Enzymes: No results for input(s): CKTOTAL, CKMB, CKMBINDEX, TROPONINI in the last 168 hours. BNP (last 3 results) No results for input(s): PROBNP in the last 8760 hours. HbA1C: No results for input(s): HGBA1C in the last 72 hours. CBG: Recent Labs  Lab 11/05/18 1526 11/05/18 1930 11/05/18 2345 11/06/18 0421 11/06/18 0745  GLUCAP 151* 124* 134* 120* 139*   Lipid Profile: No results for input(s): CHOL, HDL, LDLCALC, TRIG, CHOLHDL, LDLDIRECT in the last 72 hours. Thyroid Function Tests: No results for input(s): TSH, T4TOTAL, FREET4, T3FREE, THYROIDAB in the last 72 hours. Anemia Panel: Recent Labs    11/06/18 0230  FERRITIN 119   Urine  analysis:    Component Value Date/Time   COLORURINE STRAW (A) 11/01/2018 0901   APPEARANCEUR HAZY (A) 11/01/2018 0901   LABSPEC 1.010 11/01/2018 0901   PHURINE 5.0 11/01/2018 0901   GLUCOSEU NEGATIVE 11/01/2018 0901   HGBUR NEGATIVE 11/01/2018 0901   BILIRUBINUR NEGATIVE  11/01/2018 0901   KETONESUR NEGATIVE 11/01/2018 0901   PROTEINUR NEGATIVE 11/01/2018 0901   NITRITE NEGATIVE 11/01/2018 0901   LEUKOCYTESUR NEGATIVE 11/01/2018 0901   Sepsis Labs: (procalcitonin:4,lacticacidven:4)  )No results found for this or any previous visit (from the past 240 hour(s)).       Radiology Studies: No results found.      Scheduled Meds: . bisacodyl  10 mg Oral Once  . chlorhexidine  15 mL Mouth/Throat BID  . Chlorhexidine Gluconate Cloth  6 each Topical Daily  . enoxaparin (LOVENOX) injection  40 mg Subcutaneous Q12H  . famotidine  20 mg Per Tube Q12H  . feeding supplement (PRO-STAT SUGAR FREE 64)  60 mL Per Tube Daily  . feeding supplement (VITAL HIGH PROTEIN)  1,000 mL Per Tube Q24H  . furosemide  40 mg Oral Daily  . insulin aspart  0-9 Units Subcutaneous Q4H  . mouth rinse  15 mL Mouth Rinse 10 times per day  . multivitamin  15 mL Per Tube Daily  . oxyCODONE  10 mg Per Tube Q8H  . sodium chloride flush  10-40 mL Intracatheter Q12H  . sodium chloride flush  3 mL Intravenous Q12H   Continuous Infusions: . sodium chloride 10 mL/hr at 11/06/18 0932  . dexmedetomidine (PRECEDEX) IV infusion 0.2 mcg/kg/hr (11/06/18 0943)  . fentaNYL infusion INTRAVENOUS Stopped (11/01/18 1544)  . norepinephrine (LEVOPHED) Adult infusion 1 mcg/min (11/06/18 0932)     LOS: 15 days    Time spent: 35 minutes  During this encounter: Patient Isolation: Airborne, contact, droplet HCP PPE: Face shield, head covering, N95, gown, gloves, and shoe covers    Alberteen Sam, MD [facility] 11/06/2018, 9:48 AM     Please page through AMION:  [email or web address] Password  [email or web address] If 7PM-7AM, please contact night-coverage

## 2018-11-06 NOTE — Progress Notes (Signed)
eLink Physician-Brief Progress Note Patient Name: Shannon Meyer DOB: 1966-03-10 MRN: [id number]   Date of Service  11/06/2018  HPI/Events of Note  Hypokalemia - K+ = 3.5 and Creatinine = 0.75.  eICU Interventions  Will replace K+.     Intervention Category Major Interventions: Electrolyte abnormality - evaluation and management  Watt Geiler Eugene 11/06/2018, 6:50 AM

## 2018-11-06 NOTE — Care Management (Signed)
Case manager continues to monitor for appropriate disposition when patient medically improves. May God bless her to do so.    Vance Peper, RN BSN Case Manager [phone]

## 2018-11-07 DIAGNOSIS — L8990 Pressure ulcer of unspecified site, unspecified stage: Secondary | ICD-10-CM

## 2018-11-07 DIAGNOSIS — R451 Restlessness and agitation: Secondary | ICD-10-CM

## 2018-11-07 HISTORY — DX: Pressure ulcer of unspecified site, unspecified stage: L89.90

## 2018-11-07 LAB — CBC WITH DIFFERENTIAL/PLATELET
Abs Immature Granulocytes: 0.07 10*3/uL (ref 0.00–0.07)
Basophils Absolute: 0 10*3/uL (ref 0.0–0.1)
Basophils Relative: 0 %
Eosinophils Absolute: 0.4 10*3/uL (ref 0.0–0.5)
Eosinophils Relative: 4 %
HCT: 40.5 % (ref 36.0–46.0)
Hemoglobin: 12.5 g/dL (ref 12.0–15.0)
Immature Granulocytes: 1 %
Lymphocytes Relative: 27 %
Lymphs Abs: 2.7 10*3/uL (ref 0.7–4.0)
MCH: 28.8 pg (ref 26.0–34.0)
MCHC: 30.9 g/dL (ref 30.0–36.0)
MCV: 93.3 fL (ref 80.0–100.0)
Monocytes Absolute: 1.3 10*3/uL — ABNORMAL HIGH (ref 0.1–1.0)
Monocytes Relative: 13 %
Neutro Abs: 5.5 10*3/uL (ref 1.7–7.7)
Neutrophils Relative %: 55 %
Platelets: 153 10*3/uL (ref 150–400)
RBC: 4.34 MIL/uL (ref 3.87–5.11)
RDW: 19.6 % — ABNORMAL HIGH (ref 11.5–15.5)
WBC: 9.9 10*3/uL (ref 4.0–10.5)
nRBC: 0 % (ref 0.0–0.2)

## 2018-11-07 LAB — GLUCOSE, CAPILLARY
Glucose-Capillary: 106 mg/dL — ABNORMAL HIGH (ref 70–99)
Glucose-Capillary: 131 mg/dL — ABNORMAL HIGH (ref 70–99)
Glucose-Capillary: 101 mg/dL — ABNORMAL HIGH (ref 70–99)
Glucose-Capillary: 100 mg/dL — ABNORMAL HIGH (ref 70–99)
Glucose-Capillary: 109 mg/dL — ABNORMAL HIGH (ref 70–99)

## 2018-11-07 LAB — COMPREHENSIVE METABOLIC PANEL
ALT: 30 U/L (ref 0–44)
AST: 29 U/L (ref 15–41)
Albumin: 2.9 g/dL — ABNORMAL LOW (ref 3.5–5.0)
Alkaline Phosphatase: 98 U/L (ref 38–126)
Anion gap: 8 (ref 5–15)
BUN: 63 mg/dL — ABNORMAL HIGH (ref 6–20)
CO2: 30 mmol/L (ref 22–32)
Calcium: 8.7 mg/dL — ABNORMAL LOW (ref 8.9–10.3)
Chloride: 101 mmol/L (ref 98–111)
Creatinine, Ser: 0.69 mg/dL (ref 0.44–1.00)
GFR calc Af Amer: >60 mL/min (ref >60)
GFR calc non Af Amer: >60 mL/min (ref >60)
Glucose, Bld: 129 mg/dL — ABNORMAL HIGH (ref 70–99)
Potassium: 2.7 mmol/L — CL (ref 3.5–5.1)
Sodium: 139 mmol/L (ref 135–145)
Total Bilirubin: 0.8 mg/dL (ref 0.3–1.2)
Total Protein: 6.2 g/dL — ABNORMAL LOW (ref 6.5–8.1)

## 2018-11-07 LAB — MAGNESIUM: Magnesium: 2.2 mg/dL (ref 1.7–2.4)

## 2018-11-07 MED ORDER — POTASSIUM CHLORIDE 20 MEQ/15ML (10%) PO SOLN
40.0000 meq | Freq: Once | ORAL | Status: AC
  Administered 2018-11-07: 06:00:00 40 meq
  Filled 2018-11-07: qty 30

## 2018-11-07 MED ORDER — POTASSIUM CHLORIDE 20 MEQ/15ML (10%) PO SOLN
40.0000 meq | Freq: Every day | ORAL | Status: DC
  Administered 2018-11-07 – 2018-11-16 (×10): 40 meq via ORAL
  Filled 2018-11-07 (×10): qty 30

## 2018-11-07 MED ORDER — IPRATROPIUM-ALBUTEROL 0.5-2.5 (3) MG/3ML IN SOLN
3.0000 mL | Freq: Four times a day (QID) | RESPIRATORY_TRACT | Status: DC
  Administered 2018-11-07 – 2018-11-08 (×6): 3 mL via RESPIRATORY_TRACT
  Filled 2018-11-07 (×6): qty 3

## 2018-11-07 MED ORDER — DOCUSATE SODIUM 50 MG/5ML PO LIQD
100.0000 mg | Freq: Two times a day (BID) | ORAL | Status: DC
  Administered 2018-11-07: 12:00:00 100 mg via ORAL
  Filled 2018-11-07: qty 10

## 2018-11-07 MED ORDER — CLONAZEPAM 0.125 MG PO TBDP
0.1250 mg | ORAL_TABLET | Freq: Once | ORAL | Status: AC
  Administered 2018-11-07: 16:00:00 0.125 mg via ORAL
  Filled 2018-11-07: qty 1

## 2018-11-07 MED ORDER — CLONAZEPAM 0.125 MG PO TBDP
0.2500 mg | ORAL_TABLET | Freq: Two times a day (BID) | ORAL | Status: DC
  Administered 2018-11-07 – 2018-11-08 (×2): 0.25 mg via ORAL
  Filled 2018-11-07 (×2): qty 2

## 2018-11-07 MED ORDER — POTASSIUM CHLORIDE 20 MEQ PO PACK
40.0000 meq | PACK | Freq: Every day | ORAL | Status: DC
  Filled 2018-11-07: qty 2

## 2018-11-07 MED ORDER — DOCUSATE SODIUM 50 MG/5ML PO LIQD
100.0000 mg | Freq: Two times a day (BID) | ORAL | Status: DC
  Administered 2018-11-07 – 2018-11-20 (×23): 100 mg
  Filled 2018-11-07 (×23): qty 10

## 2018-11-07 NOTE — Progress Notes (Signed)
Attempted to call daughter of patient. Daughter did answer phone calls. Will try again later.

## 2018-11-07 NOTE — Progress Notes (Signed)
NAME:  Shannon Meyer, MRN:  [id number], DOB:  February 28, 1966, LOS: 16 ADMISSION DATE:  10/22/2018, CONSULTATION DATE:  Oct 22, 2018 REFERRING MD:  Pilar Plate, CHIEF COMPLAINT:  Dyspnea   Brief History   53 y/o female with no PMH admitted from [facility] on 10/04/2018 with acute respiratory failure with hypoxemia due to COVID-19 pneumonia.  Past Medical History  No known past medical history  Significant Hospital Events   5/20 Intubated in ED and admitted to [facility] ICU 5/21-5/26 Weaning FIO2 and PEEP, diuresing 5/26-6/3 Tolerating weaning for 1-2 hours 6/4 not weaning Consults:  Pulmonary and critical care medicine  Procedures:  5/20 ET tube>  5/24 PICC line right arm>   Significant Diagnostic Tests:  Oct 28, 2018 CT angiogram chest no pulmonary embolism, bilateral atypical pneumonia consistent with COVID-19  Micro Data:  SARS-CoV-2 May 20+ Blood culture May 20 1 out of 2 GPC staph, coag negative  Antimicrobials:  Zithromax 5/20 >>5/24 Rocephin 5/20 >>5/26  Remdesivir 5/21 > 5/30  Actemra 5/20> 5/21  Interim history/subjective:  Unable to wean yesterday. Tachypnic this morning on full support. Following commands but intermittently combative. Off levophed.   Objective   Blood pressure 113/70, pulse (!) 58, temperature 97.7 F (36.5 C), temperature source Oral, resp. rate 20, height 5\' 4"  (1.626 m), weight 87.9 kg, SpO2 100 %.    Vent Mode: PRVC FiO2 (%):  [40 %] 40 % Set Rate:  [20 bmp] 20 bmp Vt Set:  [330 mL-430 mL] 430 mL PEEP:  [5 cmH20] 5 cmH20 Plateau Pressure:  [24 cmH20-28 cmH20] 26 cmH20   Intake/Output Summary (Last 24 hours) at 11/07/2018 0711 Last data filed at 11/07/2018 0700 Gross per 24 hour  Intake 1811.08 ml  Output 1400 ml  Net 411.08 ml   Filed Weights   11/05/18 0500 11/06/18 0500 11/07/18 0500  Weight: 91.5 kg 88.3 kg 87.9 kg   Physical Exam: General: Critically ill-appearing, NAD HENT: [place], AT, ETT in place Eyes: EOMI, no icterus or  injection Respiratory: shallow breathing. Course breath sounds. No wheezing Cardiovascular: RRR, -M/R/G, no JVD GI: soft, nontender, non-distended Extremities: no pitting edema, moving extremities Neuro: following commands, alert  CXR 6/2 reviewed and interpreted by me: Bilateral infiltrates with left-side atelectasis, unchanged compared to 5/29.  Resolved Hospital Problem list     Assessment & Plan:   Acute respiratory failure with hypoxemia due to COVID-19: Slowly weaning Intubated since 5/20. Unable to wean yesterday. Will plan to continue SBTs through the next few days with trach early next week if unable to extubate.  Completed Tocilizumab and remdesivir  SBT again today - Continue pressure support as tolerated Cont VT to 8cc/kg Vent settings per [employer]DS[employer] Cont. Lasix 40 mg po qd VAP Weight-based lovenox Cont. PT/OT  Cont duonebs q6h Increase klonopin to .25 mg bid  Hypokalemia Replete K   Hypotension secondary to sedation Off levophed.   Constipation No BM three days. On scheduled opioids.   Schedule colace bid  Cont. prns  Best practice:  Diet: tube feeds Pain/Anxiety/Delirium protocol (if indicated): Yes, RASS score -1 VAP protocol (if indicated): yes DVT prophylaxis: lovenox  GI prophylaxis:  famoditine Glucose control: per TRH Mobility: bedrest Code Status: Full Family Communication: daughter [name] updated 6/4 Disposition: remain in ICU  Labs   CBC: Recent Labs  Lab 11/03/18 0140 11/03/18 0420 11/04/18 0655 11/05/18 0445 11/06/18 0230 11/07/18 0200  WBC 20.6*  --  14.6* 14.0* 13.9* 9.9  NEUTROABS  --   --   --   --  9.9* 5.5  HGB 13.5 14.6 12.7 13.0 13.1 12.5  HCT 44.2 43.0 41.1 42.4 43.2 40.5  MCV 90.8  --  92.6 93.2 93.5 93.3  PLT 223  --  181 181 176 153    Basic Metabolic Panel: Recent Labs  Lab 11/01/18 0420 11/02/18 0130 11/03/18 0140 11/03/18 0420 11/04/18 0655 11/05/18 0445 11/06/18 0230 11/07/18 0200  NA 134* 137 136  137 140 143 146* 139  K 3.4* 3.2* 3.4* 3.3* 3.1* 3.7 3.5 2.7*  CL 88* 85* 87*  --  95* 101 100 101  CO2 30 39* 38*  --  33* 34* 36* 30  GLUCOSE 93 142* 124*  --  104* 124* 126* 129*  BUN 47* 61* 68*  --  65* 70* 68* 63*  CREATININE 0.81 1.03* 1.02*  --  0.78 0.88 0.75 0.69  CALCIUM 8.5* 8.7* 8.8*  --  8.6* 8.9 9.0 8.7*  MG 2.0 2.3  --   --   --   --   --   --   PHOS 4.5 4.0  --   --   --   --   --   --    GFR: Estimated Creatinine Clearance: 85.3 mL/min (by C-G formula based on SCr of 0.69 mg/dL). Recent Labs  Lab 11/04/18 0655 11/05/18 0445 11/06/18 0230 11/07/18 0200  WBC 14.6* 14.0* 13.9* 9.9    Liver Function Tests: Recent Labs  Lab 11/01/18 0420 11/02/18 0130 11/04/18 0655 11/06/18 0230 11/07/18 0200  AST 88* 73* 38 31 29  ALT 64* 67* 38 34 30  ALKPHOS 150* 157* 137* 112 98  BILITOT 0.4 0.5 0.5 0.5 0.8  PROT 6.1* 6.3* 5.2* 6.2* 6.2*  ALBUMIN 2.9* 2.9* 2.5* 2.9* 2.9*   No results for input(s): LIPASE, AMYLASE in the last 168 hours. No results for input(s): AMMONIA in the last 168 hours.  ABG    Component Value Date/Time   PHART 7.499 (H) 11/03/2018 0420   PCO2ART 54.8 (H) 11/03/2018 0420   PO2ART 57.0 (L) 11/03/2018 0420   HCO3 42.6 (H) 11/03/2018 0420   TCO2 44 (H) 11/03/2018 0420   ACIDBASEDEF 1.0 10/24/2018 0419   O2SAT 91.0 11/03/2018 0420     Coagulation Profile: No results for input(s): INR, PROTIME in the last 168 hours.  Cardiac Enzymes: No results for input(s): CKTOTAL, CKMB, CKMBINDEX, TROPONINI in the last 168 hours.  HbA1C: No results found for: HGBA1C  CBG: Recent Labs  Lab 11/06/18 1200 11/06/18 1556 11/06/18 1933 11/06/18 2319 11/07/18 0401  GLUCAP 111* 117* 144* 102* 106*     Critical care time:

## 2018-11-07 NOTE — Progress Notes (Signed)
PROGRESS NOTE    Shannon BradfordDeborah Meyer  ZOX:[id number] DOB: 06/16/1965 DOA: 10/22/2018 PCP: No primary care provider on file.      Brief Narrative:  Mrs. Shannon NoraDixon is a 53 y.o. F with no significant PMHx who presented with about 1 week of cough, SOB, and weakness.   In the ER, she was SARS-CoV NAA positive, CXR showed bilateral opacities.  Intubated in the ER and transferred to [facility].      Assessment & Plan:  Respiratory failure Coronavirus pneumonitis with acute hypoxic respiratory failure In setting of ongoing 2020 COVID-19 pandemic.  S/p remdesivir S/p Actemra  FiO2 stable, still tachypneic and shallow respirations.   -Continue VTE prophylaxis with Lovenox -Continue oral Lasix and K supplement -Continue sedation with oral oxycodone 10 mg q6h     Hyponatremia Hypernatremia Hyponatremia has resolved.  We will tolerate some degree of hypernatremia due to need for diuresis.  Hypokalemia K down to 2.7 today.  -Supplement K aggressively -Check Mag  Positive blood culture CoNS in 1/2 cultures, likely contaminant.       MDM and disposition: The below labs and imaging reports were reviewed and summarized above.  Medication management as above.  The patient was admitted with acute hypoxic respiratory failure from COVID-19 requiring mechanical ventilation.    She remains critically ill and requires ongoing full mechanical ventilation without which she would deteriorate and expire.  This encounter involved a high degree of medical risk and an extensive number of diagnoses were considered and managed and extensive complexity of data managed.    DVT prophylaxis: Lovenox Code Status: FULL     Consultants:   PCCM  Procedures:   5/20 ETT  5/24 R PICC line  Antimicrobials:   Azithromycin 5/20 >> 5/24  Rocephin 5/20 >> 5/26  Remdesivir 5/25 >> 5/30  Actemra 5/20 and 5/21  Diflucan 5/30 >> 6/1    Subjective: Intubated and sedated.  No fever.  Somewhat  agitated overnight.      Objective: Vitals:   11/07/18 0630 11/07/18 0700 11/07/18 0711 11/07/18 0800  BP: 118/82 113/70 113/70 127/71  Pulse: 72 (!) 58 (!) 59 (!) 52  Resp: (!) 23 20 (!) 27 20  Temp:      TempSrc:      SpO2: 99% 100% 99% 99%  Weight:      Height:        Intake/Output Summary (Last 24 hours) at 11/07/2018 0930 Last data filed at 11/07/2018 0800 Gross per 24 hour  Intake 1738.86 ml  Output 1400 ml  Net 338.86 ml   Filed Weights   11/05/18 0500 11/06/18 0500 11/07/18 0500  Weight: 91.5 kg 88.3 kg 87.9 kg    Examination: General appearance: Adult female, intubated and sedated HEENT: Anicteric, conjunctival pink, lids and lashes normal.  No nasal deformity, discharge, or epistaxis.  Lips moist, ET tube in place, bite-block in place. Skin: Warm and dry, no suspicious rashes or lesions. Cardiac: RRR, no murmurs, no LE edema. Respiratory: Respirations shallow, fast  On ventilator.  Diminished bilaterally, somewhat coarse.   Abdomen: Abdomen soft, no tenderness to palpation or grimace. MSK: No deformities or effusions. Neuro: Intubated and sedated.  Moves all extremities and responds to commands.  Makes eye contact.  Makes purposeful movements. Psych:      Data Reviewed: I have personally reviewed following labs and imaging studies:  CBC: Recent Labs  Lab 11/03/18 0140 11/03/18 0420 11/04/18 0655 11/05/18 0445 11/06/18 0230 11/07/18 0200  WBC 20.6*  --  14.6* 14.0* 13.9* 9.9  NEUTROABS  --   --   --   --  9.9* 5.5  HGB 13.5 14.6 12.7 13.0 13.1 12.5  HCT 44.2 43.0 41.1 42.4 43.2 40.5  MCV 90.8  --  92.6 93.2 93.5 93.3  PLT 223  --  181 181 176 153   Basic Metabolic Panel: Recent Labs  Lab 11/01/18 0420 11/02/18 0130 11/03/18 0140 11/03/18 0420 11/04/18 0655 11/05/18 0445 11/06/18 0230 11/07/18 0200  NA 134* 137 136 137 140 143 146* 139  K 3.4* 3.2* 3.4* 3.3* 3.1* 3.7 3.5 2.7*  CL 88* 85* 87*  --  95* 101 100 101  CO2 30 39* 38*  --  33* 34*  36* 30  GLUCOSE 93 142* 124*  --  104* 124* 126* 129*  BUN 47* 61* 68*  --  65* 70* 68* 63*  CREATININE 0.81 1.03* 1.02*  --  0.78 0.88 0.75 0.69  CALCIUM 8.5* 8.7* 8.8*  --  8.6* 8.9 9.0 8.7*  MG 2.0 2.3  --   --   --   --   --  2.2  PHOS 4.5 4.0  --   --   --   --   --   --    GFR: Estimated Creatinine Clearance: 85.3 mL/min (by C-G formula based on SCr of 0.69 mg/dL). Liver Function Tests: Recent Labs  Lab 11/01/18 0420 11/02/18 0130 11/04/18 0655 11/06/18 0230 11/07/18 0200  AST 88* 73* 38 31 29  ALT 64* 67* 38 34 30  ALKPHOS 150* 157* 137* 112 98  BILITOT 0.4 0.5 0.5 0.5 0.8  PROT 6.1* 6.3* 5.2* 6.2* 6.2*  ALBUMIN 2.9* 2.9* 2.5* 2.9* 2.9*   No results for input(s): LIPASE, AMYLASE in the last 168 hours. No results for input(s): AMMONIA in the last 168 hours. Coagulation Profile: No results for input(s): INR, PROTIME in the last 168 hours. Cardiac Enzymes: No results for input(s): CKTOTAL, CKMB, CKMBINDEX, TROPONINI in the last 168 hours. BNP (last 3 results) No results for input(s): PROBNP in the last 8760 hours. HbA1C: No results for input(s): HGBA1C in the last 72 hours. CBG: Recent Labs  Lab 11/06/18 1556 11/06/18 1933 11/06/18 2319 11/07/18 0401 11/07/18 0848  GLUCAP 117* 144* 102* 106* 131*   Lipid Profile: No results for input(s): CHOL, HDL, LDLCALC, TRIG, CHOLHDL, LDLDIRECT in the last 72 hours. Thyroid Function Tests: No results for input(s): TSH, T4TOTAL, FREET4, T3FREE, THYROIDAB in the last 72 hours. Anemia Panel: Recent Labs    11/06/18 0230  FERRITIN 119   Urine analysis:    Component Value Date/Time   COLORURINE STRAW (A) 11/01/2018 0901   APPEARANCEUR HAZY (A) 11/01/2018 0901   LABSPEC 1.010 11/01/2018 0901   PHURINE 5.0 11/01/2018 0901   GLUCOSEU NEGATIVE 11/01/2018 0901   HGBUR NEGATIVE 11/01/2018 0901   BILIRUBINUR NEGATIVE 11/01/2018 0901   KETONESUR NEGATIVE 11/01/2018 0901   PROTEINUR NEGATIVE 11/01/2018 0901   NITRITE  NEGATIVE 11/01/2018 0901   LEUKOCYTESUR NEGATIVE 11/01/2018 0901   Sepsis Labs: (procalcitonin:4,lacticacidven:4)  )No results found for this or any previous visit (from the past 240 hour(s)).       Radiology Studies: No results found.      Scheduled Meds: . chlorhexidine  15 mL Mouth/Throat BID  . Chlorhexidine Gluconate Cloth  6 each Topical Daily  . clonazepam  0.125 mg Oral BID  . enoxaparin (LOVENOX) injection  40 mg Subcutaneous Q12H  . famotidine  20 mg Per Tube Q12H  .  feeding supplement (PRO-STAT SUGAR FREE 64)  60 mL Per Tube Daily  . feeding supplement (VITAL HIGH PROTEIN)  1,000 mL Per Tube Q24H  . furosemide  40 mg Oral Daily  . insulin aspart  0-9 Units Subcutaneous Q4H  . ipratropium-albuterol  3 mL Nebulization Q6H  . mouth rinse  15 mL Mouth Rinse 10 times per day  . multivitamin  15 mL Per Tube Daily  . oxyCODONE  10 mg Per Tube Q8H  . potassium chloride  40 mEq Oral Daily  . sodium chloride flush  10-40 mL Intracatheter Q12H  . sodium chloride flush  3 mL Intravenous Q12H   Continuous Infusions: . sodium chloride 10 mL/hr at 11/07/18 0800  . dexmedetomidine (PRECEDEX) IV infusion 0.6 mcg/kg/hr (11/07/18 0800)  . fentaNYL infusion INTRAVENOUS Stopped (11/01/18 1544)  . norepinephrine (LEVOPHED) Adult infusion Stopped (11/07/18 0222)     LOS: 16 days    Time spent: 35 minutes  During this encounter: Patient Isolation: Airborne, contact, droplet HCP PPE: Face shield, head covering, N95, gown, gloves, and shoe covers    Alberteen Sam, MD [facility] 11/07/2018, 9:30 AM     Please page through AMION:  [email or web address] Password [email or web address] If 7PM-7AM, please contact night-coverage

## 2018-11-07 NOTE — Progress Notes (Signed)
Patient at approximately at 0900 became extremely agitated, trying to self extubate. Leanord Asal RN discussed with MD Maryfrances Bunnell and MD Everardo All in regards to restraints. Bilateral wrist restraint order placed. Will continue to monitor and assess patient.

## 2018-11-08 LAB — CBC WITH DIFFERENTIAL/PLATELET
Abs Immature Granulocytes: 0.03 10*3/uL (ref 0.00–0.07)
Basophils Absolute: 0 10*3/uL (ref 0.0–0.1)
Basophils Relative: 0 %
Eosinophils Absolute: 0.4 10*3/uL (ref 0.0–0.5)
Eosinophils Relative: 5 %
HCT: 37.2 % (ref 36.0–46.0)
Hemoglobin: 10.9 g/dL — ABNORMAL LOW (ref 12.0–15.0)
Immature Granulocytes: 0 %
Lymphocytes Relative: 26 %
Lymphs Abs: 2.1 10*3/uL (ref 0.7–4.0)
MCH: 27.7 pg (ref 26.0–34.0)
MCHC: 29.3 g/dL — ABNORMAL LOW (ref 30.0–36.0)
MCV: 94.4 fL (ref 80.0–100.0)
Monocytes Absolute: 1 10*3/uL (ref 0.1–1.0)
Monocytes Relative: 12 %
Neutro Abs: 4.5 10*3/uL (ref 1.7–7.7)
Neutrophils Relative %: 57 %
Platelets: 134 10*3/uL — ABNORMAL LOW (ref 150–400)
RBC: 3.94 MIL/uL (ref 3.87–5.11)
RDW: 19.7 % — ABNORMAL HIGH (ref 11.5–15.5)
WBC: 8.1 10*3/uL (ref 4.0–10.5)
nRBC: 0 % (ref 0.0–0.2)

## 2018-11-08 LAB — GLUCOSE, CAPILLARY
Glucose-Capillary: 103 mg/dL — ABNORMAL HIGH (ref 70–99)
Glucose-Capillary: 110 mg/dL — ABNORMAL HIGH (ref 70–99)
Glucose-Capillary: 111 mg/dL — ABNORMAL HIGH (ref 70–99)
Glucose-Capillary: 78 mg/dL (ref 70–99)
Glucose-Capillary: 96 mg/dL (ref 70–99)

## 2018-11-08 LAB — COMPREHENSIVE METABOLIC PANEL
ALT: 28 U/L (ref 0–44)
AST: 26 U/L (ref 15–41)
Albumin: 2.6 g/dL — ABNORMAL LOW (ref 3.5–5.0)
Alkaline Phosphatase: 86 U/L (ref 38–126)
Anion gap: 6 (ref 5–15)
BUN: 46 mg/dL — ABNORMAL HIGH (ref 6–20)
CO2: 26 mmol/L (ref 22–32)
Calcium: 8.3 mg/dL — ABNORMAL LOW (ref 8.9–10.3)
Chloride: 114 mmol/L — ABNORMAL HIGH (ref 98–111)
Creatinine, Ser: 0.59 mg/dL (ref 0.44–1.00)
GFR calc Af Amer: >60 mL/min (ref >60)
GFR calc non Af Amer: >60 mL/min (ref >60)
Glucose, Bld: 115 mg/dL — ABNORMAL HIGH (ref 70–99)
Potassium: 3 mmol/L — ABNORMAL LOW (ref 3.5–5.1)
Sodium: 146 mmol/L — ABNORMAL HIGH (ref 135–145)
Total Bilirubin: 0.7 mg/dL (ref 0.3–1.2)
Total Protein: 5.2 g/dL — ABNORMAL LOW (ref 6.5–8.1)

## 2018-11-08 MED ORDER — FREE WATER
100.0000 mL | Freq: Three times a day (TID) | Status: DC
  Administered 2018-11-08 – 2018-11-10 (×7): 100 mL

## 2018-11-08 MED ORDER — QUETIAPINE FUMARATE 25 MG PO TABS
25.0000 mg | ORAL_TABLET | Freq: Every day | ORAL | Status: DC
  Administered 2018-11-08 – 2018-11-09 (×2): 25 mg via ORAL
  Filled 2018-11-08 (×2): qty 1

## 2018-11-08 MED ORDER — IPRATROPIUM-ALBUTEROL 0.5-2.5 (3) MG/3ML IN SOLN: 3.0000 mL | RESPIRATORY_TRACT | Status: DC | PRN

## 2018-11-08 MED ORDER — CLONAZEPAM 0.5 MG PO TBDP
1.0000 mg | ORAL_TABLET | Freq: Two times a day (BID) | ORAL | Status: DC
  Administered 2018-11-08 – 2018-11-10 (×4): 1 mg via ORAL
  Filled 2018-11-08 (×4): qty 2

## 2018-11-08 MED ORDER — CLONAZEPAM 0.5 MG PO TBDP
0.5000 mg | ORAL_TABLET | Freq: Once | ORAL | Status: AC
  Administered 2018-11-08: 12:00:00 0.5 mg via ORAL
  Filled 2018-11-08: qty 1

## 2018-11-08 MED ORDER — POTASSIUM CHLORIDE 10 MEQ/50ML IV SOLN
10.0000 meq | INTRAVENOUS | Status: AC
  Administered 2018-11-08 (×4): 10 meq via INTRAVENOUS
  Filled 2018-11-08 (×4): qty 50

## 2018-11-08 NOTE — Progress Notes (Signed)
NAME:  Shannon Meyer, MRN:  [id number], DOB:  [birth year], LOS: 31 ADMISSION DATE:  10/22/2018, CONSULTATION DATE: Oct 22, 2018 REFERRING MD:  Sedonia Small, CHIEF COMPLAINT: Dyspnea  Brief History   53 year old female with no past medical history admitted for med [facility] on Oct 22, 2018 with acute respiratory failure with hypoxemia due to COVID-19 pneumonia.   Past Medical History  No known past medical history  Significant Hospital Events   5/20 Intubated in ED and admitted to [name] ICU 5/21-5/26 Weaning FIO2 and PEEP, diuresing 5/26-6/3 Tolerating weaning for 1-2 hours  Consults:  Pulmonary and critical care medicine  Procedures:  5/20 ET tube>  5/24 PICC line right arm>   Significant Diagnostic Tests:  Oct 28, 2018 CT angiogram chest no pulmonary embolism, bilateral atypical pneumonia consistent with COVID-19  Micro Data:  SARS-CoV-2 May 20+ Blood culture May 20 1 out of 2 GPC staph, coag negative  Antimicrobials:  Zithromax 5/20 >>5/24 Rocephin 5/20 >>5/26 Remdesivir 5/21 >5/30 Actemra 5/20>5/21   Interim history/subjective:  Weaned for about 18 minutes this morning Became agitated, tachypneic, tachycardic Reaching for endotracheal tube frequently  Objective   Blood pressure 110/82, pulse (!) 110, temperature 97.7 F (36.5 C), temperature source Oral, resp. rate (!) 33, height 5\' 4"  (1.626 m), weight 87.9 kg, SpO2 100 %.    Vent Mode: PRVC FiO2 (%):  [40 %] 40 % Set Rate:  [20 bmp] 20 bmp Vt Set:  [430 mL] 430 mL PEEP:  [5 cmH20] 5 cmH20 Plateau Pressure:  [24 cmH20-30 cmH20] 24 cmH20   Intake/Output Summary (Last 24 hours) at 11/08/2018 0748 Last data filed at 11/08/2018 9562 Gross per 24 hour  Intake 1465.69 ml  Output 1200 ml  Net 265.69 ml   Filed Weights   11/05/18 0500 11/06/18 0500 11/07/18 0500  Weight: 91.5 kg 88.3 kg 87.9 kg    Examination:  General:  In bed on vent HENT: NCAT ETT in place PULM: CTA B, vent  supported breathing CV: RRR, no mgr GI: BS+, soft, nontender MSK: normal bulk and tone Neuro: awake on vent, follows commands, nods appropriately, moves arms, legs appropriately    Resolved Hospital Problem list     Assessment & Plan:  Acute respiratory failure with hypoxemia due to COVID-19 pneumonia Oxygenation has improved significantly Lungs still quite stiff, particularly while weaning on vent requires higher level vent support Muscles are strong so do not suspect respiratory muscle weakness Continue full mechanical ventilatory support, titrating FiO2 and PEEP to maintain SaO2 greater than 88% Target tidal volume 6 to 8 cc/kg ideal body weight, keep plateau pressure less than 30 Ventilator associated pneumonia prevention protocol Daily wake-up assessment  Need for sedation for ventilator synchrony Intermittent delirium, reaching for tubes and lines Frequent orientation, lights on during day Minimize restraints as able though today even while she was awake she repeatedly reached for the tube to self extubate, could not be redirected from this behavior Agree with Seroquel at night Continue clonazepam Continue RA SS score of -1 Fentanyl and versed prn and precedex   Best practice:  Diet: tube feeding Pain/Anxiety/Delirium protocol (if indicated): RASS -1 VAP protocol (if indicated): yes DVT prophylaxis: lovenox bid GI prophylaxis: famotidine Glucose control: SSI Mobility: PT consult, range of motion exercises Code Status: full Family Communication: I updated her daughter [name] at length on 6/6 Disposition: remain in ICU  Labs   CBC: Recent Labs  Lab 11/04/18 0655 11/05/18 0445 11/06/18 0230 11/07/18 0200 11/08/18 0430  WBC 14.6* 14.0* 13.9* 9.9 8.1  NEUTROABS  --   --  9.9* 5.5 4.5  HGB 12.7 13.0 13.1 12.5 10.9*  HCT 41.1 42.4 43.2 40.5 37.2  MCV 92.6 93.2 93.5 93.3 94.4  PLT 181 181 176 153 134*    Basic Metabolic Panel: Recent Labs  Lab  11/02/18 0130  11/04/18 0655 11/05/18 0445 11/06/18 0230 11/07/18 0200 11/08/18 0430  NA 137   < > 140 143 146* 139 146*  K 3.2*   < > 3.1* 3.7 3.5 2.7* 3.0*  CL 85*   < > 95* 101 100 101 114*  CO2 39*   < > 33* 34* 36* 30 26  GLUCOSE 142*   < > 104* 124* 126* 129* 115*  BUN 61*   < > 65* 70* 68* 63* 46*  CREATININE 1.03*   < > 0.78 0.88 0.75 0.69 0.59  CALCIUM 8.7*   < > 8.6* 8.9 9.0 8.7* 8.3*  MG 2.3  --   --   --   --  2.2  --   PHOS 4.0  --   --   --   --   --   --    < > = values in this interval not displayed.   GFR: Estimated Creatinine Clearance: 85.3 mL/min (by C-G formula based on SCr of 0.59 mg/dL). Recent Labs  Lab 11/05/18 0445 11/06/18 0230 11/07/18 0200 11/08/18 0430  WBC 14.0* 13.9* 9.9 8.1    Liver Function Tests: Recent Labs  Lab 11/02/18 0130 11/04/18 0655 11/06/18 0230 11/07/18 0200 11/08/18 0430  AST 73* 38 31 29 26   ALT 67* 38 34 30 28  ALKPHOS 157* 137* 112 98 86  BILITOT 0.5 0.5 0.5 0.8 0.7  PROT 6.3* 5.2* 6.2* 6.2* 5.2*  ALBUMIN 2.9* 2.5* 2.9* 2.9* 2.6*   No results for input(s): LIPASE, AMYLASE in the last 168 hours. No results for input(s): AMMONIA in the last 168 hours.  ABG    Component Value Date/Time   PHART 7.499 (H) 11/03/2018 0420   PCO2ART 54.8 (H) 11/03/2018 0420   PO2ART 57.0 (L) 11/03/2018 0420   HCO3 42.6 (H) 11/03/2018 0420   TCO2 44 (H) 11/03/2018 0420   ACIDBASEDEF 1.0 10/24/2018 0419   O2SAT 91.0 11/03/2018 0420     Coagulation Profile: No results for input(s): INR, PROTIME in the last 168 hours.  Cardiac Enzymes: No results for input(s): CKTOTAL, CKMB, CKMBINDEX, TROPONINI in the last 168 hours.  HbA1C: No results found for: HGBA1C  CBG: Recent Labs  Lab 11/07/18 0401 11/07/18 0848 11/07/18 1206 11/07/18 1601 11/07/18 1938  GLUCAP 106* 131* 101* 100* 109*     Critical care time: 35 minutes     Heber CarolinaBrent Wylder Macomber, MD [place] [facility] Pager: [phone] Cell: [phone] If no response, call  [phone]

## 2018-11-08 NOTE — Progress Notes (Signed)
PROGRESS NOTE    Shannon BradfordDeborah Meyer  WUJ:[id number] DOB: 01/04/1966 DOA: 10/22/2018 PCP: No primary care provider on file.      Brief Narrative:  Shannon Meyer is a 53 y.o. F with no significant PMHx who presented with about 1 week of cough, SOB, and weakness.   In the ER, she was SARS-CoV NAA positive, CXR showed bilateral opacities.  Intubated in the ER and transferred to [facility].    Assessment & Plan:  Respiratory failure Coronavirus pneumonitis with acute hypoxic respiratory failure In setting of ongoing 2020 COVID-19 pandemic.  S/p remdesivir S/p Actemra  Mentation and oxygenation good, but still rapid shallow breathing when weaned to pressure support alone.  Day 17 intubation. -Continue VTE prophylaxis with Lovenox -Continue oral Lasix and K supplement -Continue sedation with oral oxycodone 10 mg q6h -Add Seroquel for adjuvant sedation -Will prepare for likely tracheostomy  -PT ordered    Hypernatremia Hyponatremia has resolved.  Net even with oral lasix yesterday. We will tolerate some degree of hypernatremia due to need for diuresis. -Start free water  Hypokalemia K low today -Repeat supplement K aggressively  Positive blood culture CoNS in 1/2 cultures, likely contaminant.       MDM and disposition: The below labs and imaging reports were reviewed and summarized above.  Medication management as above.  The patient was admitted with acute hypoxic respiratory failure from COVID-19 requiring mechanical ventilation.    She remains critically ill and requires ongoing full mechanical ventilation without which she would likely deteriorate and expire.      DVT prophylaxis: Lovenox Code Status: FULL     Consultants:   PCCM  Procedures:   5/20 ETT  5/24 R PICC line  Antimicrobials:   Azithromycin 5/20 >> 5/24  Rocephin 5/20 >> 5/26  Remdesivir 5/25 >> 5/30  Actemra 5/20 and 5/21  Diflucan 5/30 >> 6/1    Subjective: Afebrile overnight,  weaned to 40% FiO2.  Sodium up, potassium down, still agitated and trying to pull tube when sedation is lowered..     Objective: Vitals:   11/08/18 0400 11/08/18 0405 11/08/18 0500 11/08/18 0600  BP: 95/65  110/70 110/82  Pulse: 69 61 (!) 54 (!) 110  Resp: 20 20 20  (!) 33  Temp:      TempSrc:      SpO2: 99% 98% 100% 100%  Weight:      Height:        Intake/Output Summary (Last 24 hours) at 11/08/2018 0824 Last data filed at 11/08/2018 [id number] Gross per 24 hour  Intake 1375.87 ml  Output 1200 ml  Net 175.87 ml   Filed Weights   11/05/18 0500 11/06/18 0500 11/07/18 0500  Weight: 91.5 kg 88.3 kg 87.9 kg    Examination: General appearance: Adult female, intubated and sedated. HEENT: Anicteric, conjunctival pink, lids and lashes normal.  No nasal deformity, discharge, or epistaxis.  Lips moist, ET tube in place, bite-block in place. Skin: Warm and dry, no suspicious rashes or lesions. Cardiac: Regular rate and rhythm, no murmurs, no lower extremity edema. Respiratory: Breathing comfortably on mechanical ventilator.  Lung sounds diminished bilaterally, but no wheezes or rales appreciated.   Abdomen: Abdomen soft, no tenderness to palpation or grimace. MSK: No deformities or effusions. Neuro: Intubated and sedated.  Moves all extremities and responds to commands.  Makes eye contact.  Makes purposeful movements. Psych:      Data Reviewed: I have personally reviewed following labs and imaging studies:  CBC: Recent Labs  Lab 11/04/18 0655 11/05/18 0445 11/06/18 0230 11/07/18 0200 11/08/18 0430  WBC 14.6* 14.0* 13.9* 9.9 8.1  NEUTROABS  --   --  9.9* 5.5 4.5  HGB 12.7 13.0 13.1 12.5 10.9*  HCT 41.1 42.4 43.2 40.5 37.2  MCV 92.6 93.2 93.5 93.3 94.4  PLT 181 181 176 153 101*   Basic Metabolic Panel: Recent Labs  Lab 11/02/18 0130  11/04/18 0655 11/05/18 0445 11/06/18 0230 11/07/18 0200 11/08/18 0430  NA 137   < > 140 143 146* 139 146*  K 3.2*   < > 3.1* 3.7 3.5 2.7*  3.0*  CL 85*   < > 95* 101 100 101 114*  CO2 39*   < > 33* 34* 36* 30 26  GLUCOSE 142*   < > 104* 124* 126* 129* 115*  BUN 61*   < > 65* 70* 68* 63* 46*  CREATININE 1.03*   < > 0.78 0.88 0.75 0.69 0.59  CALCIUM 8.7*   < > 8.6* 8.9 9.0 8.7* 8.3*  MG 2.3  --   --   --   --  2.2  --   PHOS 4.0  --   --   --   --   --   --    < > = values in this interval not displayed.   GFR: Estimated Creatinine Clearance: 85.3 mL/min (by C-G formula based on SCr of 0.59 mg/dL). Liver Function Tests: Recent Labs  Lab 11/02/18 0130 11/04/18 0655 11/06/18 0230 11/07/18 0200 11/08/18 0430  AST 73* 38 31 29 26   ALT 67* 38 34 30 28  ALKPHOS 157* 137* 112 98 86  BILITOT 0.5 0.5 0.5 0.8 0.7  PROT 6.3* 5.2* 6.2* 6.2* 5.2*  ALBUMIN 2.9* 2.5* 2.9* 2.9* 2.6*   No results for input(s): LIPASE, AMYLASE in the last 168 hours. No results for input(s): AMMONIA in the last 168 hours. Coagulation Profile: No results for input(s): INR, PROTIME in the last 168 hours. Cardiac Enzymes: No results for input(s): CKTOTAL, CKMB, CKMBINDEX, TROPONINI in the last 168 hours. BNP (last 3 results) No results for input(s): PROBNP in the last 8760 hours. HbA1C: No results for input(s): HGBA1C in the last 72 hours. CBG: Recent Labs  Lab 11/07/18 0401 11/07/18 0848 11/07/18 1206 11/07/18 1601 11/07/18 1938  GLUCAP 106* 131* 101* 100* 109*   Lipid Profile: No results for input(s): CHOL, HDL, LDLCALC, TRIG, CHOLHDL, LDLDIRECT in the last 72 hours. Thyroid Function Tests: No results for input(s): TSH, T4TOTAL, FREET4, T3FREE, THYROIDAB in the last 72 hours. Anemia Panel: Recent Labs    11/06/18 0230  FERRITIN 119   Urine analysis:    Component Value Date/Time   COLORURINE STRAW (A) 11/01/2018 0901   APPEARANCEUR HAZY (A) 11/01/2018 0901   LABSPEC 1.010 11/01/2018 0901   PHURINE 5.0 11/01/2018 0901   GLUCOSEU NEGATIVE 11/01/2018 0901   HGBUR NEGATIVE 11/01/2018 0901   BILIRUBINUR NEGATIVE 11/01/2018 0901    KETONESUR NEGATIVE 11/01/2018 0901   PROTEINUR NEGATIVE 11/01/2018 0901   NITRITE NEGATIVE 11/01/2018 0901   LEUKOCYTESUR NEGATIVE 11/01/2018 0901   Sepsis Labs: @LABRCNTIP (procalcitonin:4,lacticacidven:4)  )No results found for this or any previous visit (from the past 240 hour(s)).       Radiology Studies: No results found.      Scheduled Meds: . chlorhexidine  15 mL Mouth/Throat BID  . Chlorhexidine Gluconate Cloth  6 each Topical Daily  . clonazepam  0.25 mg Oral BID  . docusate  100 mg Per Tube BID  .  enoxaparin (LOVENOX) injection  40 mg Subcutaneous Q12H  . famotidine  20 mg Per Tube Q12H  . feeding supplement (PRO-STAT SUGAR FREE 64)  60 mL Per Tube Daily  . feeding supplement (VITAL HIGH PROTEIN)  1,000 mL Per Tube Q24H  . free water  100 mL Per Tube Q8H  . furosemide  40 mg Oral Daily  . insulin aspart  0-9 Units Subcutaneous Q4H  . ipratropium-albuterol  3 mL Nebulization Q6H  . mouth rinse  15 mL Mouth Rinse 10 times per day  . multivitamin  15 mL Per Tube Daily  . oxyCODONE  10 mg Per Tube Q8H  . potassium chloride  40 mEq Oral Daily  . sodium chloride flush  10-40 mL Intracatheter Q12H  . sodium chloride flush  3 mL Intravenous Q12H   Continuous Infusions: . sodium chloride 10 mL/hr at 11/08/18 0600  . dexmedetomidine (PRECEDEX) IV infusion 0.9 mcg/kg/hr (11/08/18 16100632)  . norepinephrine (LEVOPHED) Adult infusion Stopped (11/07/18 0222)  . potassium chloride       LOS: 17 days    Time spent: 25 minutes  During this encounter: Patient Isolation: Airborne, contact, droplet HCP PPE: Face shield, head covering, N95, gown, gloves, and shoe covers    Alberteen Samhristopher P Danford, MD [facility] 11/08/2018, 8:24 AM     Please page through AMION:  [email or web address] Password [email or web address] If 7PM-7AM, please contact night-coverage

## 2018-11-08 NOTE — Progress Notes (Signed)
RT NOTE:   RT assisted with transferring patient to room 9207 on vent.

## 2018-11-08 NOTE — Progress Notes (Signed)
11/08/2018 Orders for PT evaluation received.  Thank you for consulting [place].  We will evaluate pt on Monday 11/10/18.  If you have a more urgent need for [place] to see this pt, please call [phone] and leave a voicemail.    Thanks,  Barbarann Ehlers. Nick Stults, PT, DPT  Acute Rehabilitation [phone] pager [phone]) [phone] office

## 2018-11-09 ENCOUNTER — Inpatient Hospital Stay: Payer: Self-pay

## 2018-11-09 ENCOUNTER — Inpatient Hospital Stay (HOSPITAL_COMMUNITY): Payer: HRSA Program

## 2018-11-09 LAB — GLUCOSE, CAPILLARY
Glucose-Capillary: 97 mg/dL (ref 70–99)
Glucose-Capillary: 98 mg/dL (ref 70–99)
Glucose-Capillary: 98 mg/dL (ref 70–99)
Glucose-Capillary: 103 mg/dL — ABNORMAL HIGH (ref 70–99)
Glucose-Capillary: 84 mg/dL (ref 70–99)
Glucose-Capillary: 109 mg/dL — ABNORMAL HIGH (ref 70–99)

## 2018-11-09 MED ORDER — HYDROMORPHONE HCL 1 MG/ML IJ SOLN
0.5000 mg | INTRAMUSCULAR | Status: DC | PRN
  Administered 2018-11-09 – 2018-11-13 (×6): 1 mg via INTRAVENOUS
  Administered 2018-11-13: 0.5 mg via INTRAVENOUS
  Administered 2018-11-14: 1 mg via INTRAVENOUS
  Administered 2018-11-14: 0.5 mg via INTRAVENOUS
  Administered 2018-11-15 – 2018-11-17 (×5): 1 mg via INTRAVENOUS
  Administered 2018-11-17 (×2): 0.5 mg via INTRAVENOUS
  Administered 2018-11-17 – 2018-11-18 (×8): 1 mg via INTRAVENOUS
  Administered 2018-11-19 (×3): 0.5 mg via INTRAVENOUS
  Administered 2018-11-19 (×2): 1 mg via INTRAVENOUS
  Administered 2018-11-20 – 2018-11-21 (×2): 0.5 mg via INTRAVENOUS
  Administered 2018-11-21: 05:00:00 1 mg via INTRAVENOUS
  Administered 2018-11-25 (×2): 0.5 mg via INTRAVENOUS
  Filled 2018-11-09: qty 1
  Filled 2018-11-09: qty 0.5
  Filled 2018-11-09 (×7): qty 1
  Filled 2018-11-09 (×3): qty 0.5
  Filled 2018-11-09 (×6): qty 1
  Filled 2018-11-09: qty 0.5
  Filled 2018-11-09 (×7): qty 1
  Filled 2018-11-09 (×3): qty 0.5
  Filled 2018-11-09: qty 1
  Filled 2018-11-09: qty 0.5
  Filled 2018-11-09 (×6): qty 1
  Filled 2018-11-09 (×2): qty 0.5

## 2018-11-09 MED ORDER — FENTANYL CITRATE (PF) 100 MCG/2ML IJ SOLN
25.0000 ug | INTRAMUSCULAR | Status: DC | PRN
  Filled 2018-11-09: qty 2

## 2018-11-09 NOTE — Progress Notes (Signed)
NAME:  Shannon Meyer, MRN:  [id number], DOB:  24-Aug-1965, LOS: 35 ADMISSION DATE:  10/22/2018, CONSULTATION DATE: Oct 22, 2018 REFERRING MD:  Sedonia Small, CHIEF COMPLAINT: Dyspnea  Brief History   53 year old female with no past medical history admitted for med [facility] on Oct 22, 2018 with acute respiratory failure with hypoxemia due to COVID-19 pneumonia.  Past Medical History  No known past medical history  Significant Hospital Events   5/20 Intubated in ED and admitted to [facility] ICU 5/21-5/26 Weaning FIO2 and PEEP, diuresing 5/26-6/3 Tolerating weaning for 1-2 hours 6/7 weaning on pressure support ventilation again this morning 10/5  Consults:  Pulmonary and critical care medicine  Procedures:  5/20 ET tube>  5/24 PICC line right arm>   Significant Diagnostic Tests:  Oct 28, 2018 CT angiogram chest no pulmonary embolism, bilateral atypical pneumonia consistent with COVID-19  Micro Data:  SARS-CoV-2 May 20+ Blood culture May 20 1 out of 2 GPC staph, coag negative  Antimicrobials:  Zithromax 5/20 >>5/24 Rocephin 5/20 >>5/26 Remdesivir 5/21 >5/30 Actemra 5/20>5/21   Interim history/subjective:   Weaning on pressure support again this morning No other acute events  Objective   Blood pressure 130/86, pulse 77, temperature 98 F (36.7 C), temperature source Axillary, resp. rate 20, height 5\' 4"  (1.626 m), weight 87.9 kg, SpO2 100 %. CVP:  [8 mmHg] 8 mmHg  Vent Mode: PRVC FiO2 (%):  [40 %] 40 % Set Rate:  [20 bmp] 20 bmp Vt Set:  [430 mL] 430 mL PEEP:  [5 cmH20] 5 cmH20 Plateau Pressure:  [26 cmH20-35 cmH20] 26 cmH20   Intake/Output Summary (Last 24 hours) at 11/09/2018 0751 Last data filed at 11/09/2018 0700 Gross per 24 hour  Intake 1937.57 ml  Output 1200 ml  Net 737.57 ml   Filed Weights   11/05/18 0500 11/06/18 0500 11/07/18 0500  Weight: 91.5 kg 88.3 kg 87.9 kg    Examination:  General:  In bed on vent HENT: NCAT ETT in  place PULM: CTA B, vent supported breathing CV: RRR, no mgr GI: BS+, soft, nontender MSK: normal bulk and tone Neuro: sedated on vent, will wake up to voice     Resolved Hospital Problem list     Assessment & Plan:  Acute respiratory failure with hypoxemia due to COVID-19 pneumonia Oxygenation has improved significantly Ability to ventilate has improved today, question whether or not she could have chest wall stiffness from fentanyl  Pressure support as long as tolerated today Resume full mechanical ventilation after pressure support: Targeting tidal volume 6 to 8 cc/kg ideal body weight, plateau pressure less than 30 Hold off on tracheostomy placement as she seems improved today, hopeful to attempt extubation in next 1-2 days Change fentanyl to Dilaudid VAP prevention Daily WUA/SBT  Need for sedation for ventilator synchrony Intermittent delirium, reaching for tubes and lines Frequent orientation, lights on during daytime Minimize restraints as able Continue Seroquel at night Continue clonazepam Target RASS score 0 to -1, continue Precedex infusion Stop fentanyl, start Dilaudid as needed   Best practice:  Diet: tube feeding Pain/Anxiety/Delirium protocol (if indicated): RASS -1 VAP protocol (if indicated): yes DVT prophylaxis: lovenox bid GI prophylaxis: famotidine Glucose control: SSI Mobility: PT consult, range of motion exercises Code Status: full Family Communication: I updated her daughter [name] at length on 6/7 Disposition: remain in ICU  Labs   CBC: Recent Labs  Lab 11/04/18 0655 11/05/18 0445 11/06/18 0230 11/07/18 0200 11/08/18 0430  WBC 14.6* 14.0* 13.9*  9.9 8.1  NEUTROABS  --   --  9.9* 5.5 4.5  HGB 12.7 13.0 13.1 12.5 10.9*  HCT 41.1 42.4 43.2 40.5 37.2  MCV 92.6 93.2 93.5 93.3 94.4  PLT 181 181 176 153 134*    Basic Metabolic Panel: Recent Labs  Lab 11/04/18 0655 11/05/18 0445 11/06/18 0230 11/07/18 0200 11/08/18 0430  NA 140 143  146* 139 146*  K 3.1* 3.7 3.5 2.7* 3.0*  CL 95* 101 100 101 114*  CO2 33* 34* 36* 30 26  GLUCOSE 104* 124* 126* 129* 115*  BUN 65* 70* 68* 63* 46*  CREATININE 0.78 0.88 0.75 0.69 0.59  CALCIUM 8.6* 8.9 9.0 8.7* 8.3*  MG  --   --   --  2.2  --    GFR: Estimated Creatinine Clearance: 85.3 mL/min (by C-G formula based on SCr of 0.59 mg/dL). Recent Labs  Lab 11/05/18 0445 11/06/18 0230 11/07/18 0200 11/08/18 0430  WBC 14.0* 13.9* 9.9 8.1    Liver Function Tests: Recent Labs  Lab 11/04/18 0655 11/06/18 0230 11/07/18 0200 11/08/18 0430  AST 38 31 29 26   ALT 38 34 30 28  ALKPHOS 137* 112 98 86  BILITOT 0.5 0.5 0.8 0.7  PROT 5.2* 6.2* 6.2* 5.2*  ALBUMIN 2.5* 2.9* 2.9* 2.6*   No results for input(s): LIPASE, AMYLASE in the last 168 hours. No results for input(s): AMMONIA in the last 168 hours.  ABG    Component Value Date/Time   PHART 7.499 (H) 11/03/2018 0420   PCO2ART 54.8 (H) 11/03/2018 0420   PO2ART 57.0 (L) 11/03/2018 0420   HCO3 42.6 (H) 11/03/2018 0420   TCO2 44 (H) 11/03/2018 0420   ACIDBASEDEF 1.0 10/24/2018 0419   O2SAT 91.0 11/03/2018 0420     Coagulation Profile: No results for input(s): INR, PROTIME in the last 168 hours.  Cardiac Enzymes: No results for input(s): CKTOTAL, CKMB, CKMBINDEX, TROPONINI in the last 168 hours.  HbA1C: No results found for: HGBA1C  CBG: Recent Labs  Lab 11/08/18 1215 11/08/18 1617 11/08/18 2022 11/08/18 2341 11/09/18 0430  GLUCAP 111* 78 103* 110* 97     Critical care time: 35 minutes     Heber CarolinaBrent Catrina Fellenz, MD [place] [facility] Pager: [phone] Cell: [phone] If no response, call [phone]

## 2018-11-09 NOTE — Progress Notes (Signed)
In Progress  11/09/18 4:47 AM [place], Vida Roller, RN  CXR results: Right-sided PICC with tip over the right subclavian vein. Recommend further advancing an additional 10 cm. Call placed to patient's RN recommending that the provider be paged with the CXR results and asked if the current PICC can still be used until it can be replaced this am. Will await further orders.

## 2018-11-09 NOTE — Progress Notes (Signed)
Contacted Dr. Lake Bells via secure text to follow up if PICC needed to be exchanged or pulled and other IV access obtained. After assessing, Dr. Lake Bells gave the order to exchange PICC for a double lumen PICC. Will move forward with placement this morning.

## 2018-11-09 NOTE — Progress Notes (Signed)
Patient's  Brother given updates over the phone.

## 2018-11-09 NOTE — Progress Notes (Signed)
Patient's daughter. [name], was called and given updates on patient's progress for the day

## 2018-11-09 NOTE — Progress Notes (Signed)
PROGRESS NOTE    Shannon BradfordDeborah Meyer  ZOX:[id number] DOB: 06/27/1965 DOA: 10/22/2018 PCP: No primary care provider on file.      Brief Narrative:  [name] is a 53 y.o. F with no significant PMHx who presented with about 1 week of cough, SOB, and weakness.   In the ER, she was SARS-CoV NAA positive, CXR showed bilateral opacities.  Intubated in the ER and transferred to [facility].    Assessment & Plan:  Respiratory failure Coronavirus pneumonitis with acute hypoxic respiratory failure In setting of ongoing 2020 COVID-19 pandemic.  S/p remdesivir S/p Actemra  Day 18 intubation.  Difficult to wean  -Continue VTE prophylaxis with Lovenox -Continue oral Lasix and K supplement -Continue sedation with oral oxycodone 10 mg q6h -Continue Seroquel for adjuvant sedation -Will prepare for likely tracheostomy  -PT ordered      Hypernatremia Hyponatremia has resolved.   -Continue free water  Hypokalemia K low today -Continue potassium supplement  Positive blood culture CoNS in 1/2 cultures, likely contaminant.       MDM and disposition: The below labs and imaging reports reviewed and summarized above.  Medication management as above.  The patient was admitted with acute hypoxic respiratory failure from COVID-19 requiring mechanical ventilation.    She remains critically ill and requires ongoing full mechanical ventilation without which she would likely deteriorate and expire.      DVT prophylaxis: Lovenox Code Status: FULL     Consultants:   PCCM  Procedures:   5/20 ETT  5/24 R PICC line  Antimicrobials:   Azithromycin 5/20 >> 5/24  Rocephin 5/20 >> 5/26  Remdesivir 5/25 >> 5/30  Actemra 5/20 and 5/21  Diflucan 5/30 >> 6/1    Subjective: Afebrile overnight, tachypneic when wean sedation intermittently.  No agitation.  Objective: Vitals:   11/09/18 0621 11/09/18 0622 11/09/18 0800 11/09/18 0900  BP:  130/86 128/89 120/81  Pulse: 76 77 65 60   Resp: 20 20 20  (!) 21  Temp:   97.8 F (36.6 C)   TempSrc:   Oral   SpO2:  100% 99% 100%  Weight:      Height:        Intake/Output Summary (Last 24 hours) at 11/09/2018 1144 Last data filed at 11/09/2018 0700 Gross per 24 hour  Intake 1432.37 ml  Output 1200 ml  Net 232.37 ml   Filed Weights   11/05/18 0500 11/06/18 0500 11/07/18 0500  Weight: 91.5 kg 88.3 kg 87.9 kg    Examination: General appearance: Adult female, intubated and sedated HEENT: Anicteric, conjunctival pink, lids and lashes normal.  No nasal deformity, discharge, or epistaxis.  Lips moist, ET tube in place, bite-block in place. Skin: Warm and dry, no suspicious rashes or lesions. Cardiac: Regular rate and rhythm, no murmurs, no lower extremity edema Respiratory: Breathing comfortably on mechanical ventilator, lung sounds clear bilaterally. Abdomen: Abdomen soft, no tenderness to palpation or grimace. MSK: No deformities or effusions. Neuro: Intubated and sedated.  Moves all extremities and responds to commands.  Makes eye contact.  Makes purposeful movements. Psych:      Data Reviewed: I have personally reviewed following labs and imaging studies:  CBC: Recent Labs  Lab 11/04/18 0655 11/05/18 0445 11/06/18 0230 11/07/18 0200 11/08/18 0430  WBC 14.6* 14.0* 13.9* 9.9 8.1  NEUTROABS  --   --  9.9* 5.5 4.5  HGB 12.7 13.0 13.1 12.5 10.9*  HCT 41.1 42.4 43.2 40.5 37.2  MCV 92.6 93.2 93.5 93.3 94.4  PLT 181 181 176 153 326*   Basic Metabolic Panel: Recent Labs  Lab 11/04/18 0655 11/05/18 0445 11/06/18 0230 11/07/18 0200 11/08/18 0430  NA 140 143 146* 139 146*  K 3.1* 3.7 3.5 2.7* 3.0*  CL 95* 101 100 101 114*  CO2 33* 34* 36* 30 26  GLUCOSE 104* 124* 126* 129* 115*  BUN 65* 70* 68* 63* 46*  CREATININE 0.78 0.88 0.75 0.69 0.59  CALCIUM 8.6* 8.9 9.0 8.7* 8.3*  MG  --   --   --  2.2  --    GFR: Estimated Creatinine Clearance: 85.3 mL/min (by C-G formula based on SCr of 0.59 mg/dL). Liver  Function Tests: Recent Labs  Lab 11/04/18 0655 11/06/18 0230 11/07/18 0200 11/08/18 0430  AST 38 31 29 26   ALT 38 34 30 28  ALKPHOS 137* 112 98 86  BILITOT 0.5 0.5 0.8 0.7  PROT 5.2* 6.2* 6.2* 5.2*  ALBUMIN 2.5* 2.9* 2.9* 2.6*   No results for input(s): LIPASE, AMYLASE in the last 168 hours. No results for input(s): AMMONIA in the last 168 hours. Coagulation Profile: No results for input(s): INR, PROTIME in the last 168 hours. Cardiac Enzymes: No results for input(s): CKTOTAL, CKMB, CKMBINDEX, TROPONINI in the last 168 hours. BNP (last 3 results) No results for input(s): PROBNP in the last 8760 hours. HbA1C: No results for input(s): HGBA1C in the last 72 hours. CBG: Recent Labs  Lab 11/08/18 1617 11/08/18 2022 11/08/18 2341 11/09/18 0430 11/09/18 0759  GLUCAP 78 103* 110* 97 98   Lipid Profile: No results for input(s): CHOL, HDL, LDLCALC, TRIG, CHOLHDL, LDLDIRECT in the last 72 hours. Thyroid Function Tests: No results for input(s): TSH, T4TOTAL, FREET4, T3FREE, THYROIDAB in the last 72 hours. Anemia Panel: No results for input(s): VITAMINB12, FOLATE, FERRITIN, TIBC, IRON, RETICCTPCT in the last 72 hours. Urine analysis:    Component Value Date/Time   COLORURINE STRAW (A) 11/01/2018 0901   APPEARANCEUR HAZY (A) 11/01/2018 0901   LABSPEC 1.010 11/01/2018 0901   PHURINE 5.0 11/01/2018 0901   GLUCOSEU NEGATIVE 11/01/2018 0901   HGBUR NEGATIVE 11/01/2018 0901   BILIRUBINUR NEGATIVE 11/01/2018 0901   KETONESUR NEGATIVE 11/01/2018 0901   PROTEINUR NEGATIVE 11/01/2018 0901   NITRITE NEGATIVE 11/01/2018 0901   LEUKOCYTESUR NEGATIVE 11/01/2018 0901   Sepsis Labs: @LABRCNTIP (procalcitonin:4,lacticacidven:4)  )No results found for this or any previous visit (from the past 240 hour(s)).       Radiology Studies: Dg Chest Port 1 View  Result Date: 11/09/2018 CLINICAL DATA:  53 year old female positive COVID-19 status post PICC placement. EXAM: PORTABLE CHEST 1 VIEW  COMPARISON:  Chest radiograph dated 11/04/2018 FINDINGS: A right-sided PICC is noted with tip over the right subclavian vein. Recommend further advancing by an additional approximately 10 cm. Endotracheal tube remains above the carina in similar position. Enteric tube extends below the diaphragm likely within the stomach. Interval progression of bilateral confluent densities since the prior radiograph. No large pleural effusion. There is no pneumothorax. No acute osseous pathology. Stable cardiac silhouette. IMPRESSION: 1. Right-sided PICC with tip over the right subclavian vein. Recommend further advancing an additional 10 cm. 2. Interval progression of bilateral confluent densities. Electronically Signed   By: Anner Crete M.D.   On: 11/09/2018 03:25   [place] Ekg Site Rite  Result Date: 11/09/2018 If Site Rite image not attached, placement could not be confirmed due to current cardiac rhythm.       Scheduled Meds: . chlorhexidine  15 mL Mouth/Throat BID  .  Chlorhexidine Gluconate Cloth  6 each Topical Daily  . clonazepam  1 mg Oral BID  . docusate  100 mg Per Tube BID  . enoxaparin (LOVENOX) injection  40 mg Subcutaneous Q12H  . famotidine  20 mg Per Tube Q12H  . feeding supplement (PRO-STAT SUGAR FREE 64)  60 mL Per Tube Daily  . feeding supplement (VITAL HIGH PROTEIN)  1,000 mL Per Tube Q24H  . free water  100 mL Per Tube Q8H  . furosemide  40 mg Oral Daily  . insulin aspart  0-9 Units Subcutaneous Q4H  . mouth rinse  15 mL Mouth Rinse 10 times per day  . multivitamin  15 mL Per Tube Daily  . oxyCODONE  10 mg Per Tube Q8H  . potassium chloride  40 mEq Oral Daily  . QUEtiapine  25 mg Oral QHS  . sodium chloride flush  10-40 mL Intracatheter Q12H  . sodium chloride flush  3 mL Intravenous Q12H   Continuous Infusions: . sodium chloride 10 mL/hr at 11/08/18 0846  . dexmedetomidine (PRECEDEX) IV infusion 1 mcg/kg/hr (11/09/18 1054)  . norepinephrine (LEVOPHED) Adult infusion Stopped  (11/07/18 0222)     LOS: 18 days    Time spent: 15 minutes  During this encounter: Patient Isolation: Airborne, contact, droplet HCP PPE: Face shield, head covering, N95, gown, gloves, and shoe covers    Alberteen Samhristopher P Elishua Radford, MD [facility] 11/09/2018, 11:44 AM     Please page through AMION:  [email or web address] Password [email or web address] If 7PM-7AM, please contact night-coverage

## 2018-11-09 NOTE — Progress Notes (Signed)
In Progress  11/09/18 2:13 AM [place], Vida Roller, RN  IV team consulted for PICC malpositioning. Spoke with patient's RN, Trilby Drummer, who stated that the PICC was out approximately 16 cm. PICC originally at 7 cm, with good placement according to the CXR on 5/26. Instructed RN to obtain another CXR to verify placement and discuss results with the provider. Will await further instruction.

## 2018-11-09 NOTE — Progress Notes (Signed)
Attempted to contact pt daughter throughout the day.  Each time there was no answer and the voicemail box is full. Will continue to monitor.

## 2018-11-09 NOTE — Progress Notes (Signed)
Md reviewed chest xray results regarding picc line positioning. Ok to [place] picc line for now until it is replaced in the morning.

## 2018-11-09 NOTE — Progress Notes (Signed)
Patient's Sister updated on patient's current status. Emotional support offered to family.

## 2018-11-10 ENCOUNTER — Inpatient Hospital Stay (HOSPITAL_COMMUNITY): Payer: HRSA Program

## 2018-11-10 LAB — POCT I-STAT 7, (LYTES, BLD GAS, ICA,H+H)
Bicarbonate: 23.9 mmol/L (ref 20.0–28.0)
Calcium, Ion: 1.23 mmol/L (ref 1.15–1.40)
HCT: 40 % (ref 36.0–46.0)
Hemoglobin: 13.6 g/dL (ref 12.0–15.0)
O2 Saturation: 95 %
Patient temperature: 99.3
Potassium: 3.8 mmol/L (ref 3.5–5.1)
Sodium: 142 mmol/L (ref 135–145)
TCO2: 25 mmol/L (ref 22–32)
pCO2 arterial: 36 mmHg (ref 32.0–48.0)
pH, Arterial: 7.432 (ref 7.350–7.450)
pO2, Arterial: 77 mmHg — ABNORMAL LOW (ref 83.0–108.0)

## 2018-11-10 LAB — GLUCOSE, CAPILLARY
Glucose-Capillary: 109 mg/dL — ABNORMAL HIGH (ref 70–99)
Glucose-Capillary: 114 mg/dL — ABNORMAL HIGH (ref 70–99)
Glucose-Capillary: 127 mg/dL — ABNORMAL HIGH (ref 70–99)
Glucose-Capillary: 89 mg/dL (ref 70–99)
Glucose-Capillary: 95 mg/dL (ref 70–99)
Glucose-Capillary: 99 mg/dL (ref 70–99)

## 2018-11-10 LAB — BASIC METABOLIC PANEL
Anion gap: 6 (ref 5–15)
BUN: 34 mg/dL — ABNORMAL HIGH (ref 6–20)
CO2: 24 mmol/L (ref 22–32)
Calcium: 8.7 mg/dL — ABNORMAL LOW (ref 8.9–10.3)
Chloride: 113 mmol/L — ABNORMAL HIGH (ref 98–111)
Creatinine, Ser: 0.48 mg/dL (ref 0.44–1.00)
GFR calc Af Amer: >60 mL/min (ref >60)
GFR calc non Af Amer: >60 mL/min (ref >60)
Glucose, Bld: 112 mg/dL — ABNORMAL HIGH (ref 70–99)
Potassium: 3.6 mmol/L (ref 3.5–5.1)
Sodium: 143 mmol/L (ref 135–145)

## 2018-11-10 LAB — PHOSPHORUS: Phosphorus: 4.4 mg/dL (ref 2.5–4.6)

## 2018-11-10 LAB — MAGNESIUM: Magnesium: 1.9 mg/dL (ref 1.7–2.4)

## 2018-11-10 MED ORDER — DEXMEDETOMIDINE HCL IN NACL 400 MCG/100ML IV SOLN
0.0000 ug/kg/h | INTRAVENOUS | Status: AC
  Administered 2018-11-11: 0.9 ug/kg/h via INTRAVENOUS
  Administered 2018-11-12 (×2): 1.2 ug/kg/h via INTRAVENOUS
  Administered 2018-11-12: 04:00:00 0.9 ug/kg/h via INTRAVENOUS
  Administered 2018-11-12 (×2): 0.7 ug/kg/h via INTRAVENOUS
  Administered 2018-11-12 – 2018-11-13 (×4): 1.2 ug/kg/h via INTRAVENOUS
  Administered 2018-11-13: 02:00:00 1 ug/kg/h via INTRAVENOUS
  Filled 2018-11-10 (×12): qty 100

## 2018-11-10 MED ORDER — MIDAZOLAM HCL 2 MG/2ML IJ SOLN
2.0000 mg | INTRAMUSCULAR | Status: AC | PRN
  Administered 2018-11-12 – 2018-11-13 (×3): 2 mg via INTRAVENOUS
  Filled 2018-11-10 (×5): qty 2

## 2018-11-10 MED ORDER — FENTANYL CITRATE (PF) 100 MCG/2ML IJ SOLN
INTRAMUSCULAR | Status: AC
  Administered 2018-11-10: 50 ug
  Filled 2018-11-10: qty 2

## 2018-11-10 MED ORDER — ROCURONIUM BROMIDE 10 MG/ML (PF) SYRINGE
PREFILLED_SYRINGE | INTRAVENOUS | Status: AC
  Administered 2018-11-10: 80 mg
  Filled 2018-11-10: qty 10

## 2018-11-10 MED ORDER — VITAL HIGH PROTEIN PO LIQD
1000.0000 mL | ORAL | Status: DC
  Administered 2018-11-10: 17:00:00 1000 mL
  Administered 2018-11-11: 300 mL

## 2018-11-10 MED ORDER — CLONAZEPAM 0.5 MG PO TBDP: 0.5000 mg | ORAL_TABLET | Freq: Two times a day (BID) | ORAL | Status: DC

## 2018-11-10 MED ORDER — MIDAZOLAM HCL 2 MG/2ML IJ SOLN
2.0000 mg | INTRAMUSCULAR | Status: DC | PRN
  Administered 2018-11-11 – 2018-11-16 (×21): 2 mg via INTRAVENOUS
  Filled 2018-11-10 (×21): qty 2

## 2018-11-10 MED ORDER — ONDANSETRON HCL 4 MG/2ML IJ SOLN
4.0000 mg | Freq: Four times a day (QID) | INTRAMUSCULAR | Status: DC | PRN
  Administered 2018-11-10 – 2018-11-27 (×4): 4 mg via INTRAVENOUS
  Filled 2018-11-10 (×4): qty 2

## 2018-11-10 MED ORDER — LORAZEPAM 2 MG/ML IJ SOLN
1.0000 mg | Freq: Four times a day (QID) | INTRAMUSCULAR | Status: DC | PRN
  Administered 2018-11-10 – 2018-11-16 (×9): 1 mg via INTRAVENOUS
  Filled 2018-11-10 (×9): qty 1

## 2018-11-10 MED ORDER — PRO-STAT SUGAR FREE PO LIQD
30.0000 mL | Freq: Two times a day (BID) | ORAL | Status: DC
  Administered 2018-11-10 – 2018-11-11 (×2): 30 mL
  Filled 2018-11-10 (×2): qty 30

## 2018-11-10 MED ORDER — HYDROMORPHONE HCL 1 MG/ML IJ SOLN
1.0000 mg | INTRAMUSCULAR | Status: AC | PRN
  Administered 2018-11-10 – 2018-11-11 (×3): 1 mg via INTRAVENOUS
  Filled 2018-11-10 (×3): qty 1

## 2018-11-10 MED ORDER — ETOMIDATE 2 MG/ML IV SOLN
INTRAVENOUS | Status: AC
  Administered 2018-11-10: 16:00:00 20 mg
  Filled 2018-11-10: qty 20

## 2018-11-10 MED ORDER — OXYCODONE HCL 5 MG/5ML PO SOLN: 5.0000 mg | Freq: Three times a day (TID) | ORAL | Status: DC

## 2018-11-10 MED ORDER — MIDAZOLAM HCL 2 MG/2ML IJ SOLN
INTRAMUSCULAR | Status: AC
  Administered 2018-11-10: 16:00:00 2 mg
  Filled 2018-11-10: qty 2

## 2018-11-10 MED ORDER — HYDROMORPHONE HCL 1 MG/ML IJ SOLN
1.0000 mg | INTRAMUSCULAR | Status: DC | PRN
  Administered 2018-11-10: 2 mg via INTRAVENOUS
  Administered 2018-11-14 – 2018-11-17 (×3): 1 mg via INTRAVENOUS
  Filled 2018-11-10: qty 2

## 2018-11-10 NOTE — Progress Notes (Signed)
[facility] PCCM  I called her daughter to let her know that we had to re-intubate her mother, she voiced understanding.  Roselie Awkward, MD [place] [facility]M Pager: [phone] Cell: [phone] If no response, call [phone]

## 2018-11-10 NOTE — Progress Notes (Signed)
Speech Pathology:  Orders received for swallow assessment; pt extubated this am after 19 days.  Per PT, currently on [address] l [address] and RR 50.  Recommend holding PO trials and swallow assessment.  SLP will f/u next date.  Qaadir Kent L. Tivis Ringer, [place] Office number [phone]

## 2018-11-10 NOTE — Progress Notes (Signed)
Spoke with pt's daughter. She is very happy with pt's progress today and appreciative of our care. Will update her as the day progresses with any changes. Arrion Broaddus, Rande Brunt, RN

## 2018-11-10 NOTE — Progress Notes (Signed)
[facility] P[facility]M  Converted to T-Piece trial Has tolerated for 45 minutes, respirations slowed O2 improved More awake  Extubate  Roselie Awkward, MD [place] [facility] Pager: [phone] Cell: [phone] If no response, call [phone]

## 2018-11-10 NOTE — Progress Notes (Signed)
[facility] P[facility]M  I called [name] to update her about the extubation She is very excited Questions answered.  Roselie Awkward, MD [place] [facility]M Pager: [phone] Cell: [phone] If no response, call [phone]

## 2018-11-10 NOTE — Progress Notes (Signed)
[facility] P[facility]M  Extubated, vomited around time of extubation Tachypnea on my exam, but no nasal flaring, no accessory muscle use, no paradoxical abdominal movements Anxious on exam Nauseated  Plan Close monitoring in ICU Add zofran prn Add ativan prn anxiety  Will examine again later today  Roselie Awkward, MD [place] [facility] Pager: [phone] Cell: [phone] If no response, call [phone]

## 2018-11-10 NOTE — Progress Notes (Signed)
Patient's sister updated on patient's status and plan of care for the day.

## 2018-11-10 NOTE — Progress Notes (Addendum)
PROGRESS NOTE    Shannon Meyer  HEN:[id number] DOB: August 15, 1965 DOA: 10/22/2018 PCP: No primary care provider on file.      Brief Narrative:  Shannon Meyer is a 53 y.o. F with no significant PMHx who presented with about 1 week of cough, SOB, and weakness.   In the ER, she was SARS-CoV NAA positive, CXR showed bilateral opacities.  Intubated in the ER and transferred to [facility].    Assessment & Plan:  Respiratory failure Coronavirus pneumonitis with acute hypoxic respiratory failure In setting of ongoing 2020 COVID-19 pandemic.  S/p remdesivir S/p Actemra  Day 19 intubation.    -Continue VTE prophylaxis with Lovenox -Continue oral Lasix and K supplement  -Stop oxycodone and Seroquel sedation -Pulmonology hope to extubate today -PT ordered      Hypernatremia Na normalized.    -Continue free water  Hypokalemia K normal -Continue potassium supplement  Positive blood culture CoNS in 1/2 cultures, likely contaminant.       MDM and disposition: The below labs and imaging reports were reviewed and summarized above.  Medication management as above.  The patient was admitted with acute hypoxic respiratory failure from COVID-19 requiring mechanical ventilation.    She remains critically ill and requires ongoing full mechanical ventilation without which she would likely deteriorate and expire.      DVT prophylaxis: Lovenox Code Status: FULL     Consultants:   PCCM  Procedures:   5/20 ETT  5/24 R PICC line  Antimicrobials:   Azithromycin 5/20 >> 5/24  Rocephin 5/20 >> 5/26  Remdesivir 5/25 >> 5/30  Actemra 5/20 and 5/21  Diflucan 5/30 >> 6/1    Subjective: Afebrile overnight, fentanyl stopped and her respirations are improved on spontaneous breathing trial.  Objective: Vitals:   11/10/18 0757 11/10/18 0800 11/10/18 0900 11/10/18 1000  BP:  97/65 95/61 97/75   Pulse: (!) 48 (!) 50 60 72  Resp: 15 (!) 23 (!) 30 (!) 40  Temp: 97.7 F (36.5  C)     TempSrc: Axillary     SpO2: 98% 98% 97% 93%  Weight:      Height:        Intake/Output Summary (Last 24 hours) at 11/10/2018 1047 Last data filed at 11/10/2018 1000 Gross per 24 hour  Intake 2249.13 ml  Output 1100 ml  Net 1149.13 ml   Filed Weights   11/05/18 0500 11/06/18 0500 11/07/18 0500  Weight: 91.5 kg 88.3 kg 87.9 kg    Examination: General appearance: Adult female intubated and sedated HEENT: Anicteric, conjunctival pink, lids and lashes normal.  No nasal deformity, discharge, or epistaxis.  Lips moist, ET tube in place, bite-block in place. Skin: Warm and dry, no suspicious rashes or lesions. Cardiac: Regular rate and rhythm, no murmurs, no lower extremity edema Respiratory: Lungs coarse bilaterally. Abdomen: Abdomen soft, no tenderness to palpation or grimace. MSK: No deformities or effusions. Neuro: Intubated and sedated.  Moves all extremities and responds to commands.  Makes eye contact.  Makes purposeful movements. Psych:      Data Reviewed: I have personally reviewed following labs and imaging studies:  CBC: Recent Labs  Lab 11/04/18 0655 11/05/18 0445 11/06/18 0230 11/07/18 0200 11/08/18 0430  WBC 14.6* 14.0* 13.9* 9.9 8.1  NEUTROABS  --   --  9.9* 5.5 4.5  HGB 12.7 13.0 13.1 12.5 10.9*  HCT 41.1 42.4 43.2 40.5 37.2  MCV 92.6 93.2 93.5 93.3 94.4  PLT 181 181 176 153 134*   Basic  Metabolic Panel: Recent Labs  Lab 11/05/18 0445 11/06/18 0230 11/07/18 0200 11/08/18 0430 11/10/18 0200  NA 143 146* 139 146* 143  K 3.7 3.5 2.7* 3.0* 3.6  CL 101 100 101 114* 113*  CO2 34* 36* 30 26 24   GLUCOSE 124* 126* 129* 115* 112*  BUN 70* 68* 63* 46* 34*  CREATININE 0.88 0.75 0.69 0.59 0.48  CALCIUM 8.9 9.0 8.7* 8.3* 8.7*  MG  --   --  2.2  --   --    GFR: Estimated Creatinine Clearance: 85.3 mL/min (by C-G formula based on SCr of 0.48 mg/dL). Liver Function Tests: Recent Labs  Lab 11/04/18 0655 11/06/18 0230 11/07/18 0200 11/08/18 0430   AST 38 31 29 26   ALT 38 34 30 28  ALKPHOS 137* 112 98 86  BILITOT 0.5 0.5 0.8 0.7  PROT 5.2* 6.2* 6.2* 5.2*  ALBUMIN 2.5* 2.9* 2.9* 2.6*   No results for input(s): LIPASE, AMYLASE in the last 168 hours. No results for input(s): AMMONIA in the last 168 hours. Coagulation Profile: No results for input(s): INR, PROTIME in the last 168 hours. Cardiac Enzymes: No results for input(s): CKTOTAL, CKMB, CKMBINDEX, TROPONINI in the last 168 hours. BNP (last 3 results) No results for input(s): PROBNP in the last 8760 hours. HbA1C: No results for input(s): HGBA1C in the last 72 hours. CBG: Recent Labs  Lab 11/09/18 1602 11/09/18 1929 11/09/18 2314 11/10/18 0408 11/10/18 0742  GLUCAP 103* 84 109* 95 114*   Lipid Profile: No results for input(s): CHOL, HDL, LDLCALC, TRIG, CHOLHDL, LDLDIRECT in the last 72 hours. Thyroid Function Tests: No results for input(s): TSH, T4TOTAL, FREET4, T3FREE, THYROIDAB in the last 72 hours. Anemia Panel: No results for input(s): VITAMINB12, FOLATE, FERRITIN, TIBC, IRON, RETICCTPCT in the last 72 hours. Urine analysis:    Component Value Date/Time   COLORURINE STRAW (A) 11/01/2018 0901   APPEARANCEUR HAZY (A) 11/01/2018 0901   LABSPEC 1.010 11/01/2018 0901   PHURINE 5.0 11/01/2018 0901   GLUCOSEU NEGATIVE 11/01/2018 0901   HGBUR NEGATIVE 11/01/2018 0901   BILIRUBINUR NEGATIVE 11/01/2018 0901   KETONESUR NEGATIVE 11/01/2018 0901   PROTEINUR NEGATIVE 11/01/2018 0901   NITRITE NEGATIVE 11/01/2018 0901   LEUKOCYTESUR NEGATIVE 11/01/2018 0901   Sepsis Labs: @LABRCNTIP (procalcitonin:4,lacticacidven:4)  )No results found for this or any previous visit (from the past 240 hour(s)).       Radiology Studies: Dg Chest Port 1 View  Result Date: 11/09/2018 CLINICAL DATA:  53 year old female positive COVID-19 status post PICC placement. EXAM: PORTABLE CHEST 1 VIEW COMPARISON:  Chest radiograph dated 11/04/2018 FINDINGS: A right-sided PICC is noted with tip  over the right subclavian vein. Recommend further advancing by an additional approximately 10 cm. Endotracheal tube remains above the carina in similar position. Enteric tube extends below the diaphragm likely within the stomach. Interval progression of bilateral confluent densities since the prior radiograph. No large pleural effusion. There is no pneumothorax. No acute osseous pathology. Stable cardiac silhouette. IMPRESSION: 1. Right-sided PICC with tip over the right subclavian vein. Recommend further advancing an additional 10 cm. 2. Interval progression of bilateral confluent densities. Electronically Signed   By: Elgie CollardArash  Radparvar M.D.   On: 11/09/2018 03:25   [place] Ekg Site Rite  Result Date: 11/09/2018 If Site Rite image not attached, placement could not be confirmed due to current cardiac rhythm.       Scheduled Meds: . chlorhexidine  15 mL Mouth/Throat BID  . Chlorhexidine Gluconate Cloth  6 each Topical Daily  .  clonazepam  1 mg Oral BID  . docusate  100 mg Per Tube BID  . enoxaparin (LOVENOX) injection  40 mg Subcutaneous Q12H  . famotidine  20 mg Per Tube Q12H  . feeding supplement (PRO-STAT SUGAR FREE 64)  60 mL Per Tube Daily  . feeding supplement (VITAL HIGH PROTEIN)  1,000 mL Per Tube Q24H  . free water  100 mL Per Tube Q8H  . furosemide  40 mg Oral Daily  . insulin aspart  0-9 Units Subcutaneous Q4H  . mouth rinse  15 mL Mouth Rinse 10 times per day  . multivitamin  15 mL Per Tube Daily  . oxyCODONE  10 mg Per Tube Q8H  . potassium chloride  40 mEq Oral Daily  . QUEtiapine  25 mg Oral QHS  . sodium chloride flush  10-40 mL Intracatheter Q12H  . sodium chloride flush  3 mL Intravenous Q12H   Continuous Infusions: . sodium chloride 10 mL/hr at 11/10/18 1000  . dexmedetomidine (PRECEDEX) IV infusion 0.8 mcg/kg/hr (11/10/18 1000)  . norepinephrine (LEVOPHED) Adult infusion Stopped (11/07/18 0222)     LOS: 19 days    Time spent: 15 minutes  During this encounter:  Patient Isolation: Airborne, contact, droplet HCP PPE: Face shield, head covering, N95, gown, gloves, and shoe covers    Alberteen Samhristopher P Meshach Perry, MD [facility] 11/10/2018, 10:47 AM     Please page through AMION:  [email or web address] Password [email or web address] If 7PM-7AM, please contact night-coverage

## 2018-11-10 NOTE — Procedures (Signed)
Intubation Procedure Note Shannon Meyer [id number] April 18, 1966  Procedure: Intubation Indications: Respiratory insufficiency  Procedure Details Consent: Risks of procedure as well as the alternatives and risks of each were explained to the (patient/caregiver).  Consent for procedure obtained. Time Out: Verified patient identification, verified procedure, site/side was marked, verified correct patient position, special equipment/implants available, medications/allergies/relevent history reviewed, required imaging and test results available.  Performed  Drugs Etomidate 20mg , versed 2mg , fentanyl 72mcg, rocuronium 80mg  IV DL x 1 with GS 3 blade Grade 1 view 7.5 ET tube passed through cords under direct visualization Placement confirmed with bilateral breath sounds, positive EtCO2 change and smoke in tube   Evaluation Hemodynamic Status: BP stable throughout; O2 sats: stable throughout Patient's Current Condition: stable Complications: No apparent complications Patient did tolerate procedure well. Chest X-ray ordered to verify placement.  CXR: pending.   Simonne Maffucci 11/10/2018

## 2018-11-10 NOTE — Evaluation (Signed)
Physical Therapy Evaluation Patient Details Name: Shannon Meyer MRN: [id number] DOB: 1966-02-05 Today's Date: 11/10/2018   History of Present Illness  Pt adm 5/20 with hypoxic resp failure due to Covid 19. Pt intubated 5/20 and extubated 6/8. PMH - none  Clinical Impression  Pt presents with significant deficits in mobility, balance, and cognition and is tenuous cardiovascularly. Expect slow progress if medical stability allows. Eventually may benefit from CIR.    Follow Up Recommendations CIR    Equipment Recommendations  Other (comment)(To be determined)    Recommendations for Other Services       Precautions / Restrictions Precautions Precautions: Fall Precaution Comments: Monitor vitals      Mobility  Bed Mobility Overal bed mobility: Needs Assistance Bed Mobility: Rolling;Sidelying to Sit;Sit to Supine Rolling: Max assist;+2 for safety/equipment Sidelying to sit: Max assist;+2 for physical assistance;HOB elevated   Sit to supine: Total assist;+2 for physical assistance   General bed mobility comments: Assist to bring legs off of bed, roll shoulder and to reach rail, elevate trunk into sitting, and bring hips to EOB. Assist for all aspects to return   Transfers                 General transfer comment: Defered for safety  Ambulation/Gait                Stairs            Wheelchair Mobility    Modified Rankin (Stroke Patients Only)       Balance Overall balance assessment: Needs assistance Sitting-balance support: No upper extremity supported;Feet supported Sitting balance-[name] Scale: Poor Sitting balance - Comments: Sat EOB x 3 minutes with max assist. Postural control: Left lateral lean;Posterior lean                                   Pertinent Vitals/Pain Pain Assessment: Faces Faces Pain Scale: Hurts little more Pain Location: generalized Pain Descriptors / Indicators: Discomfort;Grimacing Pain Intervention(s):  Limited activity within patient's tolerance;Monitored during session;Repositioned    Home Living Family/patient expects to be discharged to:: Private residence Living Arrangements: Other relatives(sister)   Type of Home: House Home Access: Stairs to enter   Technical brewer of Steps: 2 Home Layout: One level Home Equipment: None      Prior Function Level of Independence: Needs assistance   Gait / Transfers Assistance Needed: Independent  ADL's / Homemaking Assistance Needed: Sister does IADLs including driving, cooking, and cleaning. Pt reports she doesn't work  Comments: Unsure of reliability as pt mouthing answers and nodding yes/no to simple questions. Voice very soft     Hand Dominance   Dominant Hand: Right    Extremity/Trunk Assessment   Upper Extremity Assessment Upper Extremity Assessment: Defer to OT evaluation RUE Deficits / Details: Limited ROM at shoulders. Poor grasp strength. Increased edema.  RUE Coordination: decreased fine motor;decreased gross motor LUE Deficits / Details: Limited ROM at shoulders. Poor grasp strength. Increased edema.  LUE Coordination: decreased fine motor;decreased gross motor    Lower Extremity Assessment Lower Extremity Assessment: RLE deficits/detail;LLE deficits/detail RLE Deficits / Details: Strength <2/5. AAROM WFL LLE Deficits / Details: Strength <2/5. AAROM WFL    Cervical / Trunk Assessment Cervical / Trunk Assessment: Other exceptions Cervical / Trunk Exceptions: Increased body habitus. Poor cervical ROM  Communication   Communication: Expressive difficulties(hoarse, low volume)  Cognition Arousal/Alertness: Awake/alert Behavior During Therapy: WFL for tasks assessed/performed;Anxious(WFL to  start and then became anxious at EOB) Overall Cognitive Status: Difficult to assess Area of Impairment: Attention;Following commands;Problem solving;Awareness                   Current Attention Level: Sustained    Following Commands: Follows one step commands with increased time;Follows one step commands inconsistently   Awareness: Intellectual;Emergent Problem Solving: Slow processing;Requires verbal cues General Comments: Pt following simple cues with increased time. Answering yes/no questions with nodding. Noting increased anxious after sitting at EOB for ~3 min and wanting to return to supine      General Comments General comments (skin integrity, edema, etc.): Supine in bed: HR 140's. RR 48, SpO2 90%  on 40L hi flo BP 145/85.  Sitting at EOB HR 130s, RR 36, and Spo2 90s on 40L HF.     Exercises Other Exercises Other Exercises: Cervical rotation left<>right x3   Assessment/Plan    PT Assessment Patient needs continued PT services  PT Problem List Decreased strength;Decreased range of motion;Decreased activity tolerance;Decreased balance;Decreased mobility;Decreased cognition;Decreased knowledge of use of DME;Cardiopulmonary status limiting activity;Obesity       PT Treatment Interventions DME instruction;Functional mobility training;Gait training;Therapeutic activities;Therapeutic exercise;Balance training;Cognitive remediation;Patient/family education    PT Goals (Current goals can be found in the Care Plan section)  Acute Rehab PT Goals Patient Stated Goal: "Feel better" PT Goal Formulation: Patient unable to participate in goal setting Time For Goal Achievement: 11/24/18 Potential to Achieve Goals: Fair    Frequency Min 3X/week   Barriers to discharge        Co-evaluation PT/OT/SLP Co-Evaluation/Treatment: Yes Reason for Co-Treatment: Complexity of the patient's impairments (multi-system involvement);For patient/therapist safety   OT goals addressed during session: ADL's and self-care       AM-PAC PT "6 Clicks" Mobility  Outcome Measure Help needed turning from your back to your side while in a flat bed without using bedrails?: Total Help needed moving from lying on your  back to sitting on the side of a flat bed without using bedrails?: Total Help needed moving to and from a bed to a chair (including a wheelchair)?: Total Help needed standing up from a chair using your arms (e.g., wheelchair or bedside chair)?: Total Help needed to walk in hospital room?: Total Help needed climbing 3-5 steps with a railing? : Total 6 Click Score: 6    End of Session Equipment Utilized During Treatment: Oxygen Activity Tolerance: Patient limited by fatigue;Treatment limited secondary to medical complications (Comment) Patient left: in bed;with call bell/phone within reach;with nursing/sitter in room Nurse Communication: Mobility status PT Visit Diagnosis: Other abnormalities of gait and mobility (R26.89);Muscle weakness (generalized) (M62.81)    Time: [id number] PT Time Calculation (min) (ACUTE ONLY): 28 min   Charges:   PT Evaluation $PT Eval High Complexity: 1 High          [facility] Catheline Hixon PT Acute Rehabilitation Services Pager [phone] Office [phone]   Angelina OkCary W [facility] Center[facility]cok 11/10/2018, 3:48 PM

## 2018-11-10 NOTE — Progress Notes (Signed)
Notified Dr. Loleta Books that the patient is tachycardic/tachypneic after extubation, but is in no distress. No nasal flaring, no increased work of breathing. Patient is comfortable and is working with PT/OT at this time. Oxygen saturation remains in the 90s on high flow.

## 2018-11-10 NOTE — Progress Notes (Signed)
NAME:  Shannon Meyer, MRN:  [id number], DOB:  Jul 26, 1965, LOS: 14 ADMISSION DATE:  10/22/2018, CONSULTATION DATE: Oct 22, 2018 REFERRING MD:  Sedonia Small, CHIEF COMPLAINT: Dyspnea  Brief History   53 year old female with no past medical history admitted for med [facility] on Oct 22, 2018 with acute respiratory failure with hypoxemia due to COVID-19 pneumonia.  Past Medical History  No known past medical history  Significant Hospital Events   5/20 Intubated in ED and admitted to [facility] ICU 5/21-5/26 Weaning FIO2 and PEEP, diuresing 5/26-6/3 Tolerating weaning for 1-2 hours 6/7 weaning on pressure support ventilation again this morning 10/5  Consults:  Pulmonary and critical care medicine  Procedures:  5/20 ET tube>  5/24 PICC line right arm>   Significant Diagnostic Tests:  Oct 28, 2018 CT angiogram chest no pulmonary embolism, bilateral atypical pneumonia consistent with COVID-19  Micro Data:  SARS-CoV-2 May 20+ Blood culture May 20 1 out of 2 GPC staph, coag negative  Antimicrobials:  Zithromax 5/20 >>5/24 Rocephin 5/20 >>5/26 Remdesivir 5/21 >5/30 Actemra 5/20>5/21   Interim history/subjective:   Weaning on 5/5, tachypnea, shallow breaths  Objective   Blood pressure 97/65, pulse (!) 50, temperature 97.7 F (36.5 C), temperature source Axillary, resp. rate (!) 23, height 5\' 4"  (1.626 m), weight 87.9 kg, SpO2 98 %. CVP:  [7 mmHg] 7 mmHg  Vent Mode: PRVC FiO2 (%):  [40 %] 40 % Set Rate:  [20 bmp] 20 bmp Vt Set:  [430 mL] 430 mL PEEP:  [5 cmH20] 5 cmH20 Pressure Support:  [10 cmH20-16 cmH20] 10 cmH20 Plateau Pressure:  [28 cmH20-36 cmH20] 35 cmH20   Intake/Output Summary (Last 24 hours) at 11/10/2018 0806 Last data filed at 11/10/2018 0800 Gross per 24 hour  Intake 2450.56 ml  Output 800 ml  Net 1650.56 ml   Filed Weights   11/05/18 0500 11/06/18 0500 11/07/18 0500  Weight: 91.5 kg 88.3 kg 87.9 kg    Examination:  General:  In bed  on vent HENT: NCAT ETT in place PULM: Crackles. Wheezes bilaterally B, vent supported breathing CV: RRR, no mgr GI: BS+, soft, nontender MSK: normal bulk and tone Neuro: sedated on vent, will open eyes    Resolved Hospital Problem list     Assessment & Plan:  Acute respiratory failure with hypoxemia due to COVID-19 pneumonia Oxygenation has improved significantly Ability to ventilate has improved 6/7, question whether or not she could have chest wall stiffness from fentanyl  Continue pressure support as long as tolerated today If able to wake up, slow breathing, take deeper breaths and I would consider extubation today If resumed full mechanical ventilatory support then Target tidal volume 6 to 8 cc/kg ideal body weight, plateau pressure less than 30 Hold off on tracheostomy placement as considered improving over the last 2 days Continue Dilaudid as needed Wean off Precedex VAP preventio Wake-up assessment  Need for sedation for ventilator synchrony RA SS target 0 to -1 Decrease clonazepam, oxycodone Wean off Precedex as able today Frequent orientation   Best practice:  Diet: tube feeding Pain/Anxiety/Delirium protocol (if indicated): RASS 0 to -1 VAP protocol (if indicated): yes DVT prophylaxis: lovenox bid GI prophylaxis: famotidine Glucose control: SSI Mobility: PT consult, range of motion exercises Code Status: full Family Communication: I updated her daughter [name] at length on 6/7, will call again on 6/8 Disposition: remain in ICU  Labs   CBC: Recent Labs  Lab 11/04/18 0655 11/05/18 0445 11/06/18 0230 11/07/18 0200 11/08/18 0430  WBC 14.6* 14.0* 13.9* 9.9 8.1  NEUTROABS  --   --  9.9* 5.5 4.5  HGB 12.7 13.0 13.1 12.5 10.9*  HCT 41.1 42.4 43.2 40.5 37.2  MCV 92.6 93.2 93.5 93.3 94.4  PLT 181 181 176 153 134*    Basic Metabolic Panel: Recent Labs  Lab 11/05/18 0445 11/06/18 0230 11/07/18 0200 11/08/18 0430 11/10/18 0200  NA 143 146* 139 146*  143  K 3.7 3.5 2.7* 3.0* 3.6  CL 101 100 101 114* 113*  CO2 34* 36* 30 26 24   GLUCOSE 124* 126* 129* 115* 112*  BUN 70* 68* 63* 46* 34*  CREATININE 0.88 0.75 0.69 0.59 0.48  CALCIUM 8.9 9.0 8.7* 8.3* 8.7*  MG  --   --  2.2  --   --    GFR: Estimated Creatinine Clearance: 85.3 mL/min (by C-G formula based on SCr of 0.48 mg/dL). Recent Labs  Lab 11/05/18 0445 11/06/18 0230 11/07/18 0200 11/08/18 0430  WBC 14.0* 13.9* 9.9 8.1    Liver Function Tests: Recent Labs  Lab 11/04/18 0655 11/06/18 0230 11/07/18 0200 11/08/18 0430  AST 38 31 29 26   ALT 38 34 30 28  ALKPHOS 137* 112 98 86  BILITOT 0.5 0.5 0.8 0.7  PROT 5.2* 6.2* 6.2* 5.2*  ALBUMIN 2.5* 2.9* 2.9* 2.6*   No results for input(s): LIPASE, AMYLASE in the last 168 hours. No results for input(s): AMMONIA in the last 168 hours.  ABG    Component Value Date/Time   PHART 7.499 (H) 11/03/2018 0420   PCO2ART 54.8 (H) 11/03/2018 0420   PO2ART 57.0 (L) 11/03/2018 0420   HCO3 42.6 (H) 11/03/2018 0420   TCO2 44 (H) 11/03/2018 0420   ACIDBASEDEF 1.0 10/24/2018 0419   O2SAT 91.0 11/03/2018 0420     Coagulation Profile: No results for input(s): INR, PROTIME in the last 168 hours.  Cardiac Enzymes: No results for input(s): CKTOTAL, CKMB, CKMBINDEX, TROPONINI in the last 168 hours.  HbA1C: No results found for: HGBA1C  CBG: Recent Labs  Lab 11/09/18 1602 11/09/18 1929 11/09/18 2314 11/10/18 0408 11/10/18 0742  GLUCAP 103* 84 109* 95 114*     Critical care time: 33 minutes     Heber CarolinaBrent Lileigh Fahringer, MD [place] [facility] Pager: [phone] Cell: [phone] If no response, call [phone]

## 2018-11-10 NOTE — Progress Notes (Signed)
Pt's sister in [place] updated and very excited with pt's progress. Dquan Cortopassi, Rande Brunt, RN

## 2018-11-10 NOTE — Procedures (Signed)
**Note De-Identified Carolin Quang Obfuscation** Extubation Procedure Note  Patient Details:   Name: Jacques Willingham DOB: Nov 07, 1965 MRN: [id number]   Airway Documentation:    Vent end date: 11/10/18 Vent end time: 1149   Evaluation  O2 sats: stable throughout Complications: No apparent complications Patient did tolerate procedure well. Bilateral Breath Sounds: Clear, Diminished   Yes + leak, no stridor noted. Parameters and VS WNL Claudina Oliphant, Penni Bombard 11/10/2018, 11:51 AM

## 2018-11-10 NOTE — Progress Notes (Signed)
Rehab Admissions Coordinator Note:  Patient was screened by Michel Santee for appropriateness for an Inpatient Acute Rehab Consult.  Note pt extubated today, with high RR and still requiring significant O2 support.  Will continue to follow from a distance to determine whether pt can tolerate CIR.   Michel Santee 11/10/2018, 3:35 PM  I can be reached at [phone].

## 2018-11-10 NOTE — Evaluation (Signed)
Occupational Therapy Evaluation Patient Details Name: Shannon BradfordDeborah Cerutti MRN: [id number] DOB: 04/09/1966 Today's Date: 11/10/2018    History of Present Illness Pt adm 5/20 with hypoxic resp failure due to Covid 19. Pt intubated 5/20 and extubated 6/8. PMH - none   Clinical Impression   PTA, pt reports she was living with her sister who performed all IADLs including cooking, cleaning, and driving; unsure of reliability due to decreased cognition. Pt currently requiring Max A for UB ADLs, Max-Total A for LB ADLs, and Max A+2 for bed mobility. While supine in bed, pt RR 40-50s, HT 140-150s, and SpO2 90% on 40L O2 HF. Pt tolerating sitting at EOB for ~3 minutes with Max A for support; RR 30-40s, HR 130s, and SpO2 90s on 40L HF. Pt will require further acute OT to facilitate safe dc. Recommend dc to CIR for intensive OT to optimize safety, independence with ADLs, and return to PLOF.      Follow Up Recommendations  CIR    Equipment Recommendations  Other (comment)(Defer to next venue)    Recommendations for Other Services PT consult;Speech consult     Precautions / Restrictions Precautions Precautions: Fall Precaution Comments: Monitor vitals      Mobility Bed Mobility Overal bed mobility: Needs Assistance Bed Mobility: Rolling;Sidelying to Sit;Sit to Supine Rolling: Max assist;+2 for safety/equipment Sidelying to sit: Max assist;+2 for physical assistance;HOB elevated   Sit to supine: Total assist;+2 for physical assistance   General bed mobility comments: Max A to reach RUE towards rail and then push upright into sitting. Requiring assistance to bring hips towards EOB with bedpad. Total A +2 for returning to supine   Transfers                 General transfer comment: Defered for safety    Balance Overall balance assessment: Needs assistance Sitting-balance support: No upper extremity supported;Feet supported Sitting balance-[name] Scale: Poor Sitting balance - Comments: Max  A for sitting at EOB Postural control: Left lateral lean;Posterior lean                                 ADL either performed or assessed with clinical judgement   ADL Overall ADL's : Needs assistance/impaired Eating/Feeding: NPO   Grooming: Moderate assistance;Bed level   Upper Body Bathing: Maximal assistance;Sitting;Bed level   Lower Body Bathing: Total assistance;Bed level   Upper Body Dressing : Maximal assistance;Sitting;Bed level   Lower Body Dressing: Maximal assistance;Bed level Lower Body Dressing Details (indicate cue type and reason): Max A to don socks. Pt lifting her foot off the bed               General ADL Comments: Pt agreeable to sit at EOB. PtRR dropping to 36 and HR to 130s at EOB.      Vision         Perception     Praxis      Pertinent Vitals/Pain Pain Assessment: Faces Faces Pain Scale: Hurts little more Pain Location: generalized Pain Descriptors / Indicators: Discomfort;Grimacing Pain Intervention(s): Monitored during session;Limited activity within patient's tolerance;Repositioned     Hand Dominance Right   Extremity/Trunk Assessment Upper Extremity Assessment Upper Extremity Assessment: Generalized weakness;RUE deficits/detail;LUE deficits/detail RUE Deficits / Details: Limited ROM at shoulders. Poor grasp strength. Increased edema.  RUE Coordination: decreased fine motor;decreased gross motor LUE Deficits / Details: Limited ROM at shoulders. Poor grasp strength. Increased edema.  LUE Coordination: decreased fine  motor;decreased gross motor   Lower Extremity Assessment Lower Extremity Assessment: Defer to PT evaluation   Cervical / Trunk Assessment Cervical / Trunk Assessment: Other exceptions Cervical / Trunk Exceptions: Increased body habitus. Poor cervical ROM   Communication Communication Communication: Expressive difficulties(hoarse, low volume)   Cognition Arousal/Alertness: Awake/alert Behavior During  Therapy: WFL for tasks assessed/performed;Anxious(WFL to start and then became anxious at EOB) Overall Cognitive Status: Difficult to assess Area of Impairment: Attention;Following commands;Problem solving;Awareness                   Current Attention Level: Sustained   Following Commands: Follows one step commands with increased time;Follows one step commands inconsistently   Awareness: Intellectual;Emergent Problem Solving: Slow processing;Requires verbal cues General Comments: Pt following simple cues with increased time. Answering yes/no questions with nodding. Noting increased anxious after sitting at EOB for ~3 min and wanting to return to supine   General Comments  Supine in bed: HR 140's. RR 48, SpO2 90%  on 40L hi flo BP 145/85.  Sitting at EOB HR 130s, RR 36, and Spo2 90s on 40L HF.     Exercises Exercises: Other exercises Other Exercises Other Exercises: Cervical rotation left<>right x3   Shoulder Instructions      Home Living Family/patient expects to be discharged to:: Private residence Living Arrangements: Other relatives(sister)   Type of Home: House Home Access: Stairs to enter Entrance Stairs-Number of Steps: 2   Home Layout: One level     Bathroom Shower/Tub: Chief Strategy OfficerTub/shower unit   Bathroom Toilet: Standard     Home Equipment: None          Prior Functioning/Environment Level of Independence: Needs assistance  Gait / Transfers Assistance Needed: Independent ADL's / Homemaking Assistance Needed: Sister does IADLs including driving, cooking, and cleaning. Pt reports she doesn't work   Comments: Unsure of reliability as pt mouthing answers and nodding yes/no to simple questions. Voice very soft        OT Problem List: Decreased strength;Decreased range of motion;Decreased activity tolerance;Impaired balance (sitting and/or standing);Decreased knowledge of use of DME or AE;Decreased knowledge of precautions;Impaired UE functional  use;Pain;Cardiopulmonary status limiting activity      OT Treatment/Interventions: Self-care/ADL training;Therapeutic exercise;Energy conservation;DME and/or AE instruction;Therapeutic activities;Patient/family education    OT Goals(Current goals can be found in the care plan section) Acute Rehab OT Goals Patient Stated Goal: "Feel better" OT Goal Formulation: With patient Time For Goal Achievement: 11/24/18 Potential to Achieve Goals: Good  OT Frequency: Min 3X/week   Barriers to D/C:            Co-evaluation PT/OT/SLP Co-Evaluation/Treatment: Yes Reason for Co-Treatment: Complexity of the patient's impairments (multi-system involvement);For patient/therapist safety   OT goals addressed during session: ADL's and self-care      AM-PAC OT "6 Clicks" Daily Activity     Outcome Measure Help from another person eating meals?: Total Help from another person taking care of personal grooming?: A Lot Help from another person toileting, which includes using toliet, bedpan, or urinal?: A Lot Help from another person bathing (including washing, rinsing, drying)?: A Lot Help from another person to put on and taking off regular upper body clothing?: A Lot Help from another person to put on and taking off regular lower body clothing?: A Lot 6 Click Score: 11   End of Session Equipment Utilized During Treatment: Oxygen(40L HF) Nurse Communication: Mobility status;Other (comment)(Vital)  Activity Tolerance: Patient limited by fatigue;Patient limited by pain;Treatment limited secondary to medical complications (Comment) Patient  left: in bed;with call bell/phone within reach;with nursing/sitter in room  OT Visit Diagnosis: Unsteadiness on feet (R26.81);Other abnormalities of gait and mobility (R26.89);Muscle weakness (generalized) (M62.81);Pain Pain - part of body: (Generalized)                Time: 0721-8288 OT Time Calculation (min): 28 min Charges:  OT General Charges $OT Visit: 1  Visit OT Evaluation $OT Eval High Complexity: 1 High  Khalin Royce MSOT, OTR/L Acute Rehab Pager: [phone] Office: [place] 11/10/2018, 3:08 PM

## 2018-11-11 ENCOUNTER — Inpatient Hospital Stay (HOSPITAL_COMMUNITY): Payer: HRSA Program

## 2018-11-11 LAB — BASIC METABOLIC PANEL
Anion gap: 7 (ref 5–15)
BUN: 32 mg/dL — ABNORMAL HIGH (ref 6–20)
CO2: 24 mmol/L (ref 22–32)
Calcium: 8.3 mg/dL — ABNORMAL LOW (ref 8.9–10.3)
Chloride: 111 mmol/L (ref 98–111)
Creatinine, Ser: 0.62 mg/dL (ref 0.44–1.00)
GFR calc Af Amer: >60 mL/min (ref >60)
GFR calc non Af Amer: >60 mL/min (ref >60)
Glucose, Bld: 115 mg/dL — ABNORMAL HIGH (ref 70–99)
Potassium: 3.3 mmol/L — ABNORMAL LOW (ref 3.5–5.1)
Sodium: 142 mmol/L (ref 135–145)

## 2018-11-11 LAB — CBC
HCT: 38.6 % (ref 36.0–46.0)
Hemoglobin: 11.4 g/dL — ABNORMAL LOW (ref 12.0–15.0)
MCH: 28 pg (ref 26.0–34.0)
MCHC: 29.5 g/dL — ABNORMAL LOW (ref 30.0–36.0)
MCV: 94.8 fL (ref 80.0–100.0)
Platelets: 153 10*3/uL (ref 150–400)
RBC: 4.07 MIL/uL (ref 3.87–5.11)
RDW: 18.9 % — ABNORMAL HIGH (ref 11.5–15.5)
WBC: 10.1 10*3/uL (ref 4.0–10.5)
nRBC: 0 % (ref 0.0–0.2)

## 2018-11-11 LAB — MAGNESIUM
Magnesium: 2.2 mg/dL (ref 1.7–2.4)
Magnesium: 2.2 mg/dL (ref 1.7–2.4)

## 2018-11-11 LAB — GLUCOSE, CAPILLARY
Glucose-Capillary: 73 mg/dL (ref 70–99)
Glucose-Capillary: 80 mg/dL (ref 70–99)
Glucose-Capillary: 117 mg/dL — ABNORMAL HIGH (ref 70–99)
Glucose-Capillary: 112 mg/dL — ABNORMAL HIGH (ref 70–99)
Glucose-Capillary: 118 mg/dL — ABNORMAL HIGH (ref 70–99)
Glucose-Capillary: 100 mg/dL — ABNORMAL HIGH (ref 70–99)
Glucose-Capillary: 133 mg/dL — ABNORMAL HIGH (ref 70–99)

## 2018-11-11 LAB — PHOSPHORUS
Phosphorus: 4.4 mg/dL (ref 2.5–4.6)
Phosphorus: 3.9 mg/dL (ref 2.5–4.6)

## 2018-11-11 MED ORDER — VITAL HIGH PROTEIN PO LIQD
1000.0000 mL | ORAL | Status: AC
  Administered 2018-11-11 – 2018-11-18 (×8): 1000 mL
  Filled 2018-11-11 (×5): qty 1000

## 2018-11-11 MED ORDER — POTASSIUM CHLORIDE 20 MEQ PO PACK
40.0000 meq | PACK | Freq: Once | ORAL | Status: AC
  Administered 2018-11-11: 40 meq via ORAL
  Filled 2018-11-11: qty 2

## 2018-11-11 MED ORDER — PRO-STAT SUGAR FREE PO LIQD
60.0000 mL | Freq: Every day | ORAL | Status: DC
  Administered 2018-11-12 – 2018-11-18 (×7): 60 mL
  Filled 2018-11-11 (×7): qty 60

## 2018-11-11 MED ORDER — VITAL AF 1.2 CAL PO LIQD: 1000.0000 mL | ORAL | Status: DC

## 2018-11-11 NOTE — Progress Notes (Signed)
PROGRESS NOTE    Shannon BradfordDeborah Meyer  BJY:[id number] DOB: 04/10/1966 DOA: 10/22/2018 PCP: No primary care provider on file.      Brief Narrative:  Shannon Meyer is a 53 y.o. F with no significant PMHx who presented with about 1 week of cough, SOB, and weakness.   In the ER, she was SARS-CoV NAA positive, CXR showed bilateral opacities.  Intubated in the ER and transferred to [facility].    Assessment & Plan:  Respiratory failure Coronavirus pneumonitis with acute hypoxic respiratory failure In setting of ongoing 2020 COVID-19 pandemic.  S/p remdesivir S/p Actemra  Day 20 intubation.    -Continue VTE prophylaxis with Lovenox -Continue oral Lasix and K supplement  -Plan for tracheostomy tentatively Thursday     Hypernatremia Stable, normal -Continue free water  Hypokalemia K low -Continue potassium supplement  Positive blood culture CoNS in 1/2 cultures, likely contaminant.       MDM and disposition: The below labs and imaging reports were reviewed and summarized above.  Medication management as above.  The patient was admitted with acute hypoxic respiratory failure from COVID-19 requiring mechanical ventilation.    She remains critically ill and requires ongoing full mechanical ventilation without which she would likely deteriorate and expire.      DVT prophylaxis: Lovenox Code Status: FULL     Consultants:   PCCM  Procedures:   5/20 ETT  5/24 R PICC line  Antimicrobials:   Azithromycin 5/20 >> 5/24  Rocephin 5/20 >> 5/26  Remdesivir 5/25 >> 5/30  Actemra 5/20 and 5/21  Diflucan 5/30 >> 6/1    Subjective: Extubated yesterday, but was tachycardic, encephalopathic, and tachypneic, reintubated in the afternoon.  Vomiting since then.  Chest x-ray this morning no change from yesterday.  No new fever.  Objective: Vitals:   11/11/18 0600 11/11/18 0741 11/11/18 0749 11/11/18 0800  BP: (!) 99/54 122/63 114/64 110/60  Pulse: 88 64 60 62  Resp:  (!) 27 20 20 20   Temp:  98.2 F (36.8 C)    TempSrc:  Axillary    SpO2: 97% 99% 99% 99%  Weight:      Height:        Intake/Output Summary (Last 24 hours) at 11/11/2018 1134 Last data filed at 11/11/2018 0800 Gross per 24 hour  Intake 1305.87 ml  Output 600 ml  Net 705.87 ml   Filed Weights   11/06/18 0500 11/07/18 0500 11/11/18 0500  Weight: 88.3 kg 87.9 kg 92.1 kg    Examination: General appearance: Adult female, intubated and sedated HEENT: Anicteric, conjunctival pink, lids and lashes normal.  No nasal deformity, discharge, or epistaxis.  Bleeding from tongue wound, old, reopened last night.  Lips moist, ET tube in place,  Skin: Warm and dry, no suspicious rashes or lesions. Cardiac: Regular rate and rhythm, no murmurs, no lower extremity edema Respiratory: Lungs diminished in the left, no wheezing. Abdomen: Abdomen soft, no tenderness to palpation or grimace. MSK: No deformities or effusions. Neuro: Intubated and sedated. Psych:      Data Reviewed: I have personally reviewed following labs and imaging studies:  CBC: Recent Labs  Lab 11/05/18 0445 11/06/18 0230 11/07/18 0200 11/08/18 0430 11/10/18 1722 11/11/18 0355  WBC 14.0* 13.9* 9.9 8.1  --  10.1  NEUTROABS  --  9.9* 5.5 4.5  --   --   HGB 13.0 13.1 12.5 10.9* 13.6 11.4*  HCT 42.4 43.2 40.5 37.2 40.0 38.6  MCV 93.2 93.5 93.3 94.4  --  94.8  PLT 181 176 153 134*  --  144   Basic Metabolic Panel: Recent Labs  Lab 11/06/18 0230 11/07/18 0200 11/08/18 0430 11/10/18 0200 11/10/18 1657 11/10/18 1722 11/11/18 0355  NA 146* 139 146* 143  --  142 142  K 3.5 2.7* 3.0* 3.6  --  3.8 3.3*  CL 100 101 114* 113*  --   --  111  CO2 36* 30 26 24   --   --  24  GLUCOSE 126* 129* 115* 112*  --   --  115*  BUN 68* 63* 46* 34*  --   --  32*  CREATININE 0.75 0.69 0.59 0.48  --   --  0.62  CALCIUM 9.0 8.7* 8.3* 8.7*  --   --  8.3*  MG  --  2.2  --   --  1.9  --  2.2  PHOS  --   --   --   --  4.4  --  4.4   GFR:  Estimated Creatinine Clearance: 87.4 mL/min (by C-G formula based on SCr of 0.62 mg/dL). Liver Function Tests: Recent Labs  Lab 11/06/18 0230 11/07/18 0200 11/08/18 0430  AST 31 29 26   ALT 34 30 28  ALKPHOS 112 98 86  BILITOT 0.5 0.8 0.7  PROT 6.2* 6.2* 5.2*  ALBUMIN 2.9* 2.9* 2.6*   No results for input(s): LIPASE, AMYLASE in the last 168 hours. No results for input(s): AMMONIA in the last 168 hours. Coagulation Profile: No results for input(s): INR, PROTIME in the last 168 hours. Cardiac Enzymes: No results for input(s): CKTOTAL, CKMB, CKMBINDEX, TROPONINI in the last 168 hours. BNP (last 3 results) No results for input(s): PROBNP in the last 8760 hours. HbA1C: No results for input(s): HGBA1C in the last 72 hours. CBG: Recent Labs  Lab 11/10/18 1627 11/10/18 1951 11/10/18 2319 11/11/18 0344 11/11/18 0737  GLUCAP 99 80 89 117* 112*   Lipid Profile: No results for input(s): CHOL, HDL, LDLCALC, TRIG, CHOLHDL, LDLDIRECT in the last 72 hours. Thyroid Function Tests: No results for input(s): TSH, T4TOTAL, FREET4, T3FREE, THYROIDAB in the last 72 hours. Anemia Panel: No results for input(s): VITAMINB12, FOLATE, FERRITIN, TIBC, IRON, RETICCTPCT in the last 72 hours. Urine analysis:    Component Value Date/Time   COLORURINE STRAW (A) 11/01/2018 0901   APPEARANCEUR HAZY (A) 11/01/2018 0901   LABSPEC 1.010 11/01/2018 0901   PHURINE 5.0 11/01/2018 0901   GLUCOSEU NEGATIVE 11/01/2018 0901   HGBUR NEGATIVE 11/01/2018 0901   BILIRUBINUR NEGATIVE 11/01/2018 0901   KETONESUR NEGATIVE 11/01/2018 0901   PROTEINUR NEGATIVE 11/01/2018 0901   NITRITE NEGATIVE 11/01/2018 0901   LEUKOCYTESUR NEGATIVE 11/01/2018 0901   Sepsis Labs: @LABRCNTIP (procalcitonin:4,lacticacidven:4)  )No results found for this or any previous visit (from the past 240 hour(s)).       Radiology Studies: Dg Abd 1 View  Result Date: 11/10/2018 CLINICAL DATA:  Orogastric placement EXAM: ABDOMEN - 1  VIEW COMPARISON:  None. FINDINGS: Orogastric tube enters the stomach, loops in the body and has its tip in the antrum. Bowel gas pattern appears normal. IMPRESSION: Orogastric tip in the antrum. Electronically Signed   By: Nelson Chimes M.D.   On: 11/10/2018 16:50   Dg Chest Port 1 View  Result Date: 11/11/2018 CLINICAL DATA:  Follow-up aspiration EXAM: PORTABLE CHEST 1 VIEW COMPARISON:  11/10/2018 FINDINGS: Cardiac shadow is stable. Endotracheal tube and gastric catheter are noted in satisfactory position. Right-sided PICC line is noted at the cavoatrial junction. Patchy infiltrates are again  seen bilaterally stable from the prior exam. No pneumothorax is noted. IMPRESSION: No change from the previous day. Electronically Signed   By: Alcide CleverMark  Lukens M.D.   On: 11/11/2018 07:52   Dg Chest Port 1 View  Result Date: 11/10/2018 CLINICAL DATA:  Intubation.  OG tube placement. EXAM: PORTABLE CHEST 1 VIEW COMPARISON:  November 09, 2018 FINDINGS: The ETT remains in good position, 2 cm above the carina. Bilateral pulmonary infiltrates remain, similar on the left and more prominent on the right in the interval. The OG tube terminates below today's film. No other changes. IMPRESSION: Support apparatus as above. Bilateral pulmonary infiltrates are stable on the left and mildly more prominent on the right in the interval. Electronically Signed   By: Gerome Samavid  Williams III M.D   On: 11/10/2018 16:27        Scheduled Meds: . chlorhexidine  15 mL Mouth/Throat BID  . Chlorhexidine Gluconate Cloth  6 each Topical Daily  . docusate  100 mg Per Tube BID  . enoxaparin (LOVENOX) injection  40 mg Subcutaneous Q12H  . famotidine  20 mg Per Tube Q12H  . feeding supplement (PRO-STAT SUGAR FREE 64)  30 mL Per Tube BID  . feeding supplement (VITAL HIGH PROTEIN)  1,000 mL Per Tube Q24H  . furosemide  40 mg Oral Daily  . insulin aspart  0-9 Units Subcutaneous Q4H  . multivitamin  15 mL Per Tube Daily  . potassium chloride  40 mEq  Oral Daily  . sodium chloride flush  10-40 mL Intracatheter Q12H  . sodium chloride flush  3 mL Intravenous Q12H   Continuous Infusions: . sodium chloride 10 mL/hr at 11/11/18 0800  . dexmedetomidine (PRECEDEX) IV infusion 0.6 mcg/kg/hr (11/11/18 [id number])  . dexmedetomidine (PRECEDEX) IV infusion    . norepinephrine (LEVOPHED) Adult infusion 6 mcg/min (11/11/18 1028)     LOS: 20 days    Time spent: 15 minutes  During this encounter: Patient Isolation: Airborne, contact, droplet HCP PPE: Face shield, head covering, N95, gown, gloves, and shoe covers    Alberteen Samhristopher P Valrie Jia, MD [facility] 11/11/2018, 11:34 AM     Please page through AMION:  [email or web address] Password [email or web address] If 7PM-7AM, please contact night-coverage

## 2018-11-11 NOTE — Progress Notes (Signed)
[facility] PCCM  Updated her daughter, questions answered  Roselie Awkward, MD [place] [facility] Pager: [phone] Cell: [phone] If no response, call [phone]

## 2018-11-11 NOTE — Progress Notes (Signed)
Spoke with both [name] (daughter) and Madaline Savage (sister), both were given updates and questions were answered.

## 2018-11-11 NOTE — Progress Notes (Addendum)
Pt's dtr [name] & sister (who lives in [place]) were given updates.

## 2018-11-11 NOTE — Progress Notes (Signed)
NAME:  Shannon BradfordDeborah Meyer, MRN:  [id number], DOB:  09/01/1965, LOS: 20 ADMISSION DATE:  10/22/2018, CONSULTATION DATE: Oct 22, 2018 REFERRING MD:  Pilar PlateBero, CHIEF COMPLAINT: Dyspnea  Brief History   53 year old female with no past medical history [facility], 2018 with acute respiratory failure with hypoxemia due to COVID-19 pneumonia.  Past Medical History  No known past medical history  Significant Hospital Events   5/20 Intubated in ED and [facility] 5/21-5/26 Weaning FIO2 and PEEP, diuresing 5/26-6/3 Tolerating weaning for 1-2 hours 6/7 weaning on pressure support ventilation again this morning 10/5 6/8 extubated, reintubated (tachypnea, tachycardia)  Consults:  Pulmonary and critical care medicine  Procedures:  5/20 ET tube>  5/24 PICC line right arm>   Significant Diagnostic Tests:  Oct 28, 2018 CT angiogram chest no pulmonary embolism, bilateral atypical pneumonia consistent with COVID-19  Micro Data:  SARS-CoV-2 May 20+ Blood culture May 20 1 out of 2 GPC staph, coag negative  Antimicrobials:  Zithromax 5/20 >>5/24 Rocephin 5/20 >>5/26 Remdesivir 5/21 >5/30 Actemra 5/20>5/21   Interim history/subjective:   Extubated yesterday, re intubated about 3 hours later for dyspnea, tachycardia Stable overnight  Objective   Blood pressure 122/63, pulse 64, temperature 98.2 F (36.8 C), temperature source Axillary, resp. rate 20, height 5\' 4"  (1.626 m), weight 92.1 kg, SpO2 99 %.    Vent Mode: PRVC FiO2 (%):  [40 %-60 %] 40 % Set Rate:  [20 bmp] 20 bmp Vt Set:  [430 mL] 430 mL PEEP:  [5 cmH20] 5 cmH20 Plateau Pressure:  [31 cmH20-32 cmH20] 31 cmH20   Intake/Output Summary (Last 24 hours) at 11/11/2018 0803 Last data filed at 11/11/2018 0600 Gross per 24 hour  Intake 1429.85 ml  Output 900 ml  Net 529.85 ml   Filed Weights   11/06/18 0500 11/07/18 0500 11/11/18 0500  Weight: 88.3 kg 87.9 kg 92.1 kg     Examination:  General:  In bed on vent HENT: NCAT ETT in place PULM: CTA B, vent supported breathing CV: RRR, no mgr GI: BS+, soft, nontender MSK: normal bulk and tone Neuro: sedated on vent    Resolved Hospital Problem list     Assessment & Plan:  Acute respiratory failure with hypoxemia due to COVID-19 pneumonia Oxygenation has improved significantly Failed extubation on 6/9 Continue pressure support as long as tolerated today Full ventilator support when not on pressure support Ventilator associated pneumonia prevention protocol If not able to extubate in the next 1 to 2 days then plan tracheostomy for Thursday, June 11  Need for sedation for ventilator synchrony RASS goal 0 to -1 Minimize clonazepam, oxycodone precedex as needed Frequent orientation   Best practice:  Diet: tube feeding Pain/Anxiety/Delirium protocol (if indicated): RASS 0 to -1 VAP protocol (if indicated): yes DVT prophylaxis: lovenox bid GI prophylaxis: famotidine Glucose control: SSI Mobility: PT consult, range of motion exercises Code Status: full Family Communication: I updated her daughter Antonietta JewelJustine at length on 6/7, will call again on 6/8 Disposition: remain in ICU  Labs   CBC: Recent Labs  Lab 11/05/18 0445 11/06/18 0230 11/07/18 0200 11/08/18 0430 11/10/18 1722 11/11/18 0355  WBC 14.0* 13.9* 9.9 8.1  --  10.1  NEUTROABS  --  9.9* 5.5 4.5  --   --   HGB 13.0 13.1 12.5 10.9* 13.6 11.4*  HCT 42.4 43.2 40.5 37.2 40.0 38.6  MCV 93.2 93.5 93.3 94.4  --  94.8  PLT 181  176 153 134*  --  712    Basic Metabolic Panel: Recent Labs  Lab 11/06/18 0230 11/07/18 0200 11/08/18 0430 11/10/18 0200 11/10/18 1657 11/10/18 1722 11/11/18 0355  NA 146* 139 146* 143  --  142 142  K 3.5 2.7* 3.0* 3.6  --  3.8 3.3*  CL 100 101 114* 113*  --   --  111  CO2 36* 30 26 24   --   --  24  GLUCOSE 126* 129* 115* 112*  --   --  115*  BUN 68* 63* 46* 34*  --   --  32*  CREATININE 0.75 0.69 0.59  0.48  --   --  0.62  CALCIUM 9.0 8.7* 8.3* 8.7*  --   --  8.3*  MG  --  2.2  --   --  1.9  --  2.2  PHOS  --   --   --   --  4.4  --  4.4   GFR: Estimated Creatinine Clearance: 87.4 mL/min (by C-G formula based on SCr of 0.62 mg/dL). Recent Labs  Lab 11/06/18 0230 11/07/18 0200 11/08/18 0430 11/11/18 0355  WBC 13.9* 9.9 8.1 10.1    Liver Function Tests: Recent Labs  Lab 11/06/18 0230 11/07/18 0200 11/08/18 0430  AST 31 29 26   ALT 34 30 28  ALKPHOS 112 98 86  BILITOT 0.5 0.8 0.7  PROT 6.2* 6.2* 5.2*  ALBUMIN 2.9* 2.9* 2.6*   No results for input(s): LIPASE, AMYLASE in the last 168 hours. No results for input(s): AMMONIA in the last 168 hours.  ABG    Component Value Date/Time   PHART 7.432 11/10/2018 1722   PCO2ART 36.0 11/10/2018 1722   PO2ART 77.0 (L) 11/10/2018 1722   HCO3 23.9 11/10/2018 1722   TCO2 25 11/10/2018 1722   ACIDBASEDEF 1.0 10/24/2018 0419   O2SAT 95.0 11/10/2018 1722     Coagulation Profile: No results for input(s): INR, PROTIME in the last 168 hours.  Cardiac Enzymes: No results for input(s): CKTOTAL, CKMB, CKMBINDEX, TROPONINI in the last 168 hours.  HbA1C: No results found for: HGBA1C  CBG: Recent Labs  Lab 11/10/18 0742 11/10/18 1627 11/10/18 2319 11/11/18 0344 11/11/18 0737  GLUCAP 114* 99 89 117* 112*     Critical care time: 35 minutes     Roselie Awkward, MD [place] Pager: [phone] Cell: [phone] If no response, call [phone]

## 2018-11-11 NOTE — Progress Notes (Signed)
Pt vomited a large amount of brown liquid, pts mouth was suctioned, also vent suctioned and zofran was given. Tube feed was also stopped. Pt resting comfortably for now. WCTM

## 2018-11-11 NOTE — Progress Notes (Addendum)
Nutrition Follow-up / Consult RD working remotely.  DOCUMENTATION CODES:   Obesity unspecified  INTERVENTION:   Continue TF via NGT:   Vital High Protein at 40 ml/h (960 ml per day)   Pro-stat 60 ml once daily   Provides 1160 kcal, 114 gm protein, 803 ml free water daily  NUTRITION DIAGNOSIS:   Inadequate oral intake related to inability to eat as evidenced by NPO status.  Ongoing  GOAL:   Provide needs based on ASPEN/SCCM guidelines  Unmet with TF off  MONITOR:   Vent status, TF tolerance, Labs, Skin  REASON FOR ASSESSMENT:   Ventilator, Consult Enteral/tube feeding initiation and management  ASSESSMENT:   53 yo female with no significant PMH who was admitted with COVID-19.  Patient was extubated on 6/8, but required re-intubation.  Patient remains intubated on ventilator support today. Plans for tracheostomy 6/11 if unable to extubate by then.   Temp (24hrs), Avg:99.1 F (37.3 C), Min:98.2 F (36.8 C), Max:100 F (37.8 C)    NGT in place. TF held this morning due to vomiting. Vital High Protein at 40 ml/h restarted at 2pm.   Labs reviewed. Potassium 3.3 (L) CBG's: 117-112-118  Medications reviewed and include Lasix, Novolog, MVI, KCl, Leveophed.  Weight trending up some. I/O net negative 4.8 L  Diet Order:   Diet Order            Diet NPO time specified  Diet effective now              EDUCATION NEEDS:   No education needs have been identified at this time  Skin:  Skin Assessment: Skin Integrity Issues: Skin Integrity Issues:: Stage II Stage II: perineum  Last BM:  6/5  Height:   Ht Readings from Last 1 Encounters:  10/22/18 5\' 4"  (1.626 m)    Weight:   Wt Readings from Last 1 Encounters:  11/11/18 92.1 kg    Ideal Body Weight:  54.5 kg  BMI:  Body mass index is 34.85 kg/m.  Estimated Nutritional Needs:   Kcal:  5400-8676  Protein:  109 gm  Fluid:  >/= 1.6 L    Molli Barrows, RD, LDN, [place] Pager  [phone] After Hours Pager [phone]

## 2018-11-11 NOTE — Progress Notes (Addendum)
Spoke with pts daughter on the phone and gave her updates

## 2018-11-12 LAB — BASIC METABOLIC PANEL
Anion gap: 5 (ref 5–15)
BUN: 30 mg/dL — ABNORMAL HIGH (ref 6–20)
CO2: 25 mmol/L (ref 22–32)
Calcium: 8 mg/dL — ABNORMAL LOW (ref 8.9–10.3)
Chloride: 111 mmol/L (ref 98–111)
Creatinine, Ser: 0.56 mg/dL (ref 0.44–1.00)
GFR calc Af Amer: >60 mL/min (ref >60)
GFR calc non Af Amer: >60 mL/min (ref >60)
Glucose, Bld: 97 mg/dL (ref 70–99)
Potassium: 3.7 mmol/L (ref 3.5–5.1)
Sodium: 141 mmol/L (ref 135–145)

## 2018-11-12 LAB — GLUCOSE, CAPILLARY
Glucose-Capillary: 118 mg/dL — ABNORMAL HIGH (ref 70–99)
Glucose-Capillary: 108 mg/dL — ABNORMAL HIGH (ref 70–99)
Glucose-Capillary: 125 mg/dL — ABNORMAL HIGH (ref 70–99)
Glucose-Capillary: 109 mg/dL — ABNORMAL HIGH (ref 70–99)
Glucose-Capillary: 127 mg/dL — ABNORMAL HIGH (ref 70–99)
Glucose-Capillary: 105 mg/dL — ABNORMAL HIGH (ref 70–99)

## 2018-11-12 LAB — CBC
HCT: 35.7 % — ABNORMAL LOW (ref 36.0–46.0)
Hemoglobin: 10.7 g/dL — ABNORMAL LOW (ref 12.0–15.0)
MCH: 28.4 pg (ref 26.0–34.0)
MCHC: 30 g/dL (ref 30.0–36.0)
MCV: 94.7 fL (ref 80.0–100.0)
Platelets: 124 10*3/uL — ABNORMAL LOW (ref 150–400)
RBC: 3.77 MIL/uL — ABNORMAL LOW (ref 3.87–5.11)
RDW: 18.5 % — ABNORMAL HIGH (ref 11.5–15.5)
WBC: 9.4 10*3/uL (ref 4.0–10.5)
nRBC: 0 % (ref 0.0–0.2)

## 2018-11-12 LAB — PHOSPHORUS: Phosphorus: 3.5 mg/dL (ref 2.5–4.6)

## 2018-11-12 LAB — MAGNESIUM: Magnesium: 2 mg/dL (ref 1.7–2.4)

## 2018-11-12 MED ORDER — CHLORHEXIDINE GLUCONATE 0.12 % MT SOLN
15.0000 mL | Freq: Two times a day (BID) | OROMUCOSAL | Status: DC
  Administered 2018-11-12 – 2018-12-09 (×51): 15 mL via OROMUCOSAL
  Filled 2018-11-12 (×32): qty 15

## 2018-11-12 MED ORDER — VECURONIUM BROMIDE 10 MG IV SOLR
10.0000 mg | Freq: Once | INTRAVENOUS | Status: DC
  Filled 2018-11-12: qty 10

## 2018-11-12 MED ORDER — ORAL CARE MOUTH RINSE
15.0000 mL | OROMUCOSAL | Status: DC
  Administered 2018-11-12 – 2018-11-27 (×132): 15 mL via OROMUCOSAL

## 2018-11-12 MED ORDER — ETOMIDATE 2 MG/ML IV SOLN
40.0000 mg | Freq: Once | INTRAVENOUS | Status: AC
  Administered 2018-11-13: 14:00:00 20 mg via INTRAVENOUS
  Filled 2018-11-12: qty 20

## 2018-11-12 MED ORDER — ENOXAPARIN SODIUM 40 MG/0.4ML ~~LOC~~ SOLN
40.0000 mg | Freq: Two times a day (BID) | SUBCUTANEOUS | Status: DC
  Administered 2018-11-13 – 2018-11-16 (×6): 40 mg via SUBCUTANEOUS
  Filled 2018-11-12 (×6): qty 0.4

## 2018-11-12 MED ORDER — FENTANYL CITRATE (PF) 100 MCG/2ML IJ SOLN
200.0000 ug | Freq: Once | INTRAMUSCULAR | Status: AC
  Administered 2018-11-13: 14:00:00 100 ug via INTRAVENOUS
  Filled 2018-11-12: qty 4

## 2018-11-12 MED ORDER — CHLORHEXIDINE GLUCONATE 0.12% ORAL RINSE (MEDLINE KIT)
15.0000 mL | Freq: Two times a day (BID) | OROMUCOSAL | Status: DC
  Administered 2018-11-12: 15 mL via OROMUCOSAL

## 2018-11-12 MED ORDER — MIDAZOLAM HCL 2 MG/2ML IJ SOLN
5.0000 mg | Freq: Once | INTRAMUSCULAR | Status: AC
  Administered 2018-11-13: 14:00:00 2 mg via INTRAVENOUS
  Filled 2018-11-12: qty 6

## 2018-11-12 NOTE — Progress Notes (Signed)
Patient's sister called, updated on patient's status, questions answered regarding next steps with trach tomorrow. RN will call patient's daughter after 3:45 when she is off work, as she only had limited times she could talk while at work today.

## 2018-11-12 NOTE — Progress Notes (Signed)
OT Cancellation Note  Patient Details Name: Shannon Meyer MRN: [id number] DOB: Mar 19, 1966   Cancelled Treatment:    Reason Eval/Treat Not Completed: Medical issues which prohibited therapy(Reintubated and currently sedated. Will return as schedule as schedule allows and pt medically ready. Thank you.)  [place], OTR/L Acute Rehab Pager: [phone] Office: [phone] 11/12/2018, 3:56 PM

## 2018-11-12 NOTE — Progress Notes (Signed)
PROGRESS NOTE  Shannon BradfordDeborah Gravely  UJW:[id number] DOB: 04/03/1966 DOA: 10/22/2018 PCP: No primary care provider on file.   Brief Narrative: Shannon Meyer is a 53 y.o. female with no significant medical history who presented with cough, weakness and shortness of breath found to have covid-19 with bilateral CXR opacities, intubated in ED [facility]   Assessment & Plan: Principal Problem:   COVID-19 Active Problems:   Leukocytosis   Sepsis (HCC)   Elevated LFTs   QT prolongation   Acute respiratory failure with hypoxia (HCC)   Pressure injury of skin  Acute hypoxic respiratory failure due to covid-19 pneumonia: Failed extubation 6/8. - Complete remdesivir 5/21 - 5/30, received actemra 5/20, and 5/21. Received CTX 5/20-5/26, azithromycin 5/20-5/24. - Continue airborne, contact precautions. PPE including surgical gown, gloves, face shield, cap, shoe covers, and N-95 used during this encounter in a negative pressure room.  - Maintain euvolemia/net negative.  - Avoid NSAIDs  - Tracheostomy tomorrow. will get cortrak placement today.  - Requiring low level NE (1.5-143mcg/min) due to sedation more likely than sepsis. On precedex 0.8-0.229mcg/kg/hr.  - Ventilator settings per PCCM, currently pressure seupport 7 over 5. Slightly elevated airway pressures noted. - Diurese as able.  Hypernatremia:  - Continue free water  Hypokalemia:  - Supplement and monitor  Positive blood cultures: 1 of 4 bottles, suspect CoNS contaminant.   Thrombocytopenia: Mild. Plt 121 today.  - Ok to continue lovenox 40mg  q12h per covid ICU protocol.  Obesity: BMI 34.  - Optimize nutritional status  DVT prophylaxis: Lovenox Code Status: Full Family Communication: PCCM updated Daughter by phone. Disposition Plan: Remain in ICU  Consultants:   PCCM  Procedures:   5/20 ETT  5/24 R PICC line  Antimicrobials:  Azithromycin 5/20 >> 5/24  Rocephin 5/20 >> 5/26  Remdesivir 5/25 >> 5/30   Actemra 5/20 and 5/21  Diflucan 5/30 >> 6/1   Subjective: No new events noted or reported.   Objective: Vitals:   11/12/18 0845 11/12/18 0900 11/12/18 0915 11/12/18 0930  BP: (!) 100/58 (!) 98/56 116/66 100/62  Pulse: 91 97 89 94  Resp: (!) 26 (!) 26 (!) 33 (!) 32  Temp:      TempSrc:      SpO2: 95% 94% 94% (!) 88%  Weight:      Height:        Intake/Output Summary (Last 24 hours) at 11/12/2018 0950 Last data filed at 11/12/2018 0927 Gross per 24 hour  Intake 2068.32 ml  Output 850 ml  Net 1218.32 ml   Filed Weights   11/07/18 0500 11/11/18 0500 11/12/18 0348  Weight: 87.9 kg 92.1 kg 90.6 kg    Gen: 53 y.o. female awake in bed Pulm: Non-labored, tachypneic with pressure supported breaths. Clear to auscultation bilaterally.  CV: Regular rate and rhythm. No murmur, rub, or gallop. No JVD, no significant pedal edema. GI: Abdomen soft, non-tender, non-distended, with normoactive bowel sounds. No organomegaly or masses felt. Ext: Warm, no deformities Skin: No rashes, lesions or ulcers Neuro: Alert, follows occasional commands, moving all extremities, tracking Psych: UTD  Data Reviewed: I have personally reviewed following labs and imaging studies  CBC: Recent Labs  Lab 11/06/18 0230 11/07/18 0200 11/08/18 0430 11/10/18 1722 11/11/18 0355 11/12/18 0403  WBC 13.9* 9.9 8.1  --  10.1 9.4  NEUTROABS 9.9* 5.5 4.5  --   --   --   HGB 13.1 12.5 10.9* 13.6 11.4* 10.7*  HCT 43.2 40.5 37.2 40.0 38.6 35.7*  MCV 93.5 93.3 94.4  --  94.8 94.7  PLT 176 153 134*  --  153 329*   Basic Metabolic Panel: Recent Labs  Lab 11/07/18 0200 11/08/18 0430 11/10/18 0200 11/10/18 1657 11/10/18 1722 11/11/18 0355 11/11/18 1640 11/12/18 0403  NA 139 146* 143  --  142 142  --  141  K 2.7* 3.0* 3.6  --  3.8 3.3*  --  3.7  CL 101 114* 113*  --   --  111  --  111  CO2 30 26 24   --   --  24  --  25  GLUCOSE 129* 115* 112*  --   --  115*  --  97  BUN 63* 46* 34*  --   --  32*  --  30*   CREATININE 0.69 0.59 0.48  --   --  0.62  --  0.56  CALCIUM 8.7* 8.3* 8.7*  --   --  8.3*  --  8.0*  MG 2.2  --   --  1.9  --  2.2 2.2 2.0  PHOS  --   --   --  4.4  --  4.4 3.9 3.5   GFR: Estimated Creatinine Clearance: 86.7 mL/min (by C-G formula based on SCr of 0.56 mg/dL). Liver Function Tests: Recent Labs  Lab 11/06/18 0230 11/07/18 0200 11/08/18 0430  AST 31 29 26   ALT 34 30 28  ALKPHOS 112 98 86  BILITOT 0.5 0.8 0.7  PROT 6.2* 6.2* 5.2*  ALBUMIN 2.9* 2.9* 2.6*   No results for input(s): LIPASE, AMYLASE in the last 168 hours. No results for input(s): AMMONIA in the last 168 hours. Coagulation Profile: No results for input(s): INR, PROTIME in the last 168 hours. Cardiac Enzymes: No results for input(s): CKTOTAL, CKMB, CKMBINDEX, TROPONINI in the last 168 hours. BNP (last 3 results) No results for input(s): PROBNP in the last 8760 hours. HbA1C: No results for input(s): HGBA1C in the last 72 hours. CBG: Recent Labs  Lab 11/11/18 1130 11/11/18 1938 11/11/18 2309 11/12/18 0346 11/12/18 0731  GLUCAP 118* 100* 133* 108* 125*   Lipid Profile: No results for input(s): CHOL, HDL, LDLCALC, TRIG, CHOLHDL, LDLDIRECT in the last 72 hours. Thyroid Function Tests: No results for input(s): TSH, T4TOTAL, FREET4, T3FREE, THYROIDAB in the last 72 hours. Anemia Panel: No results for input(s): VITAMINB12, FOLATE, FERRITIN, TIBC, IRON, RETICCTPCT in the last 72 hours. Urine analysis:    Component Value Date/Time   COLORURINE STRAW (A) 11/01/2018 0901   APPEARANCEUR HAZY (A) 11/01/2018 0901   LABSPEC 1.010 11/01/2018 0901   PHURINE 5.0 11/01/2018 0901   GLUCOSEU NEGATIVE 11/01/2018 0901   HGBUR NEGATIVE 11/01/2018 0901   BILIRUBINUR NEGATIVE 11/01/2018 0901   KETONESUR NEGATIVE 11/01/2018 0901   PROTEINUR NEGATIVE 11/01/2018 0901   NITRITE NEGATIVE 11/01/2018 0901   LEUKOCYTESUR NEGATIVE 11/01/2018 0901   No results found for this or any previous visit (from the past 240  hour(s)).    Radiology Studies: Dg Abd 1 View  Result Date: 11/10/2018 CLINICAL DATA:  Orogastric placement EXAM: ABDOMEN - 1 VIEW COMPARISON:  None. FINDINGS: Orogastric tube enters the stomach, loops in the body and has its tip in the antrum. Bowel gas pattern appears normal. IMPRESSION: Orogastric tip in the antrum. Electronically Signed   By: Nelson Chimes M.D.   On: 11/10/2018 16:50   Dg Chest Port 1 View  Result Date: 11/11/2018 CLINICAL DATA:  Follow-up aspiration EXAM: PORTABLE CHEST 1 VIEW COMPARISON:  11/10/2018 FINDINGS:  Cardiac shadow is stable. Endotracheal tube and gastric catheter are noted in satisfactory position. Right-sided PICC line is noted at the cavoatrial junction. Patchy infiltrates are again seen bilaterally stable from the prior exam. No pneumothorax is noted. IMPRESSION: No change from the previous day. Electronically Signed   By: Alcide CleverMark  Lukens M.D.   On: 11/11/2018 07:52   Dg Chest Port 1 View  Result Date: 11/10/2018 CLINICAL DATA:  Intubation.  OG tube placement. EXAM: PORTABLE CHEST 1 VIEW COMPARISON:  November 09, 2018 FINDINGS: The ETT remains in good position, 2 cm above the carina. Bilateral pulmonary infiltrates remain, similar on the left and more prominent on the right in the interval. The OG tube terminates below today's film. No other changes. IMPRESSION: Support apparatus as above. Bilateral pulmonary infiltrates are stable on the left and mildly more prominent on the right in the interval. Electronically Signed   By: Gerome Samavid  Williams III M.D   On: 11/10/2018 16:27    Scheduled Meds: . chlorhexidine  15 mL Mouth/Throat BID  . Chlorhexidine Gluconate Cloth  6 each Topical Daily  . docusate  100 mg Per Tube BID  . enoxaparin (LOVENOX) injection  40 mg Subcutaneous Q12H  . famotidine  20 mg Per Tube Q12H  . feeding supplement (PRO-STAT SUGAR FREE 64)  60 mL Per Tube Daily  . feeding supplement (VITAL HIGH PROTEIN)  1,000 mL Per Tube Q24H  . furosemide  40 mg Oral  Daily  . insulin aspart  0-9 Units Subcutaneous Q4H  . mouth rinse  15 mL Mouth Rinse 10 times per day  . multivitamin  15 mL Per Tube Daily  . potassium chloride  40 mEq Oral Daily  . sodium chloride flush  10-40 mL Intracatheter Q12H  . sodium chloride flush  3 mL Intravenous Q12H   Continuous Infusions: . sodium chloride 10 mL/hr at 11/12/18 0800  . dexmedetomidine (PRECEDEX) IV infusion 0.7 mcg/kg/hr (11/12/18 0944)  . norepinephrine (LEVOPHED) Adult infusion 1 mcg/min (11/12/18 0800)     LOS: 21 days   Time spent: 35 minutes.  Tyrone Nineyan B Oziel Beitler, MD [facility] [email or web address] Password [email or web address] 11/12/2018, 9:50 AM

## 2018-11-12 NOTE — Progress Notes (Signed)
Patient's daughter, [name] updated over the phone, questions answered.

## 2018-11-12 NOTE — Progress Notes (Signed)
Spoke with pts sister from [place] and gave her updates

## 2018-11-12 NOTE — Progress Notes (Addendum)
pts daughter called, spoke with her and gave updates. She's requesting that the Dr's call at [place], 1100, 1315, or after 1545 as she is going back to work and does not want to miss any phone calls.

## 2018-11-12 NOTE — Progress Notes (Signed)
NAME:  Shannon Meyer, MRN:  [id number], DOB:  Aug 04, 1965, LOS: 21 ADMISSION DATE:  10/22/2018, CONSULTATION DATE: Oct 22, 2018 REFERRING MD:  Sedonia Small, CHIEF COMPLAINT: Dyspnea  Brief History   53 year old female with no past medical history admitted for med [facility] on Oct 22, 2018 with acute respiratory failure with hypoxemia due to COVID-19 pneumonia.  Past Medical History  No known past medical history  Significant Hospital Events   5/20 Intubated in ED and admitted to [facility] ICU 5/21-5/26 Weaning FIO2 and PEEP, diuresing 5/26-6/3 Tolerating weaning for 1-2 hours 6/7 weaning on pressure support ventilation again this morning 10/5 6/8 extubated, reintubated (tachypnea, tachycardia)  Consults:  Pulmonary and critical care medicine  Procedures:  5/20 ET tube>  5/24 PICC line right arm>   Significant Diagnostic Tests:  Oct 28, 2018 CT angiogram chest no pulmonary embolism, bilateral atypical pneumonia consistent with COVID-19  Micro Data:  SARS-CoV-2 May 20+ Blood culture May 20 1 out of 2 GPC staph, coag negative  Antimicrobials:  Zithromax 5/20 >>5/24 Rocephin 5/20 >>5/26 Remdesivir 5/21 >5/30 Actemra 5/20>5/21   Interim history/subjective:   Failed weaning attempt again this morning Awake and alert but somewhat confused on exam  Objective   Blood pressure 106/64, pulse 69, temperature 99.6 F (37.6 C), temperature source Axillary, resp. rate 20, height 5\' 4"  (1.626 m), weight 90.6 kg, SpO2 97 %.    Vent Mode: PRVC FiO2 (%):  [40 %-60 %] 40 % Set Rate:  [20 bmp-30 bmp] 20 bmp Vt Set:  [430 mL] 430 mL PEEP:  [5 cmH20] 5 cmH20 Plateau Pressure:  [31 cmH20-43 cmH20] 41 cmH20   Intake/Output Summary (Last 24 hours) at 11/12/2018 2778 Last data filed at 11/12/2018 0700 Gross per 24 hour  Intake 1962.85 ml  Output 850 ml  Net 1112.85 ml   Filed Weights   11/07/18 0500 11/11/18 0500 11/12/18 0348  Weight: 87.9 kg 92.1 kg 90.6 kg     Examination:  General:  In bed on vent HENT: NCAT ETT in place PULM: CTA B, vent supported breathing CV: RRR, no mgr GI: BS+, soft, nontender MSK: normal bulk and tone Neuro: awake, follows some simple commands    Resolved Hospital Problem list     Assessment & Plan:  Acute respiratory failure with hypoxemia due to COVID-19 pneumonia: slow improvement Oxygenation has improved significantly but lungs remain stiff Failed extubation on 6/9 Continue pressure support as long as tolerated today, then resume 8cc/kg TVol Plan tracheostomy tomorrow Ventilator associated pneumonia prevention protocol Place cortrak gastrostomy tube today   Need for sedation for ventilator synchrony RASS goal 0 to -1 Minimize sedation with clonazepam, oxycodone, precedex Frequent orientation   Best practice:  Diet: tube feeding Pain/Anxiety/Delirium protocol (if indicated): RASS 0 to -1 VAP protocol (if indicated): yes DVT prophylaxis: lovenox bid GI prophylaxis: famotidine Glucose control: SSI Mobility: PT consult, range of motion exercises Code Status: full Family Communication:Daughter updated at length on 6/9, will call again today Disposition: remain in ICU  Labs   CBC: Recent Labs  Lab 11/06/18 0230 11/07/18 0200 11/08/18 0430 11/10/18 1722 11/11/18 0355 11/12/18 0403  WBC 13.9* 9.9 8.1  --  10.1 9.4  NEUTROABS 9.9* 5.5 4.5  --   --   --   HGB 13.1 12.5 10.9* 13.6 11.4* 10.7*  HCT 43.2 40.5 37.2 40.0 38.6 35.7*  MCV 93.5 93.3 94.4  --  94.8 94.7  PLT 176 153 134*  --  153 124*  Basic Metabolic Panel: Recent Labs  Lab 11/07/18 0200 11/08/18 0430 11/10/18 0200 11/10/18 1657 11/10/18 1722 11/11/18 0355 11/11/18 1640 11/12/18 0403  NA 139 146* 143  --  142 142  --  141  K 2.7* 3.0* 3.6  --  3.8 3.3*  --  3.7  CL 101 114* 113*  --   --  111  --  111  CO2 30 26 24   --   --  24  --  25  GLUCOSE 129* 115* 112*  --   --  115*  --  97  BUN 63* 46* 34*  --   --  32*   --  30*  CREATININE 0.69 0.59 0.48  --   --  0.62  --  0.56  CALCIUM 8.7* 8.3* 8.7*  --   --  8.3*  --  8.0*  MG 2.2  --   --  1.9  --  2.2 2.2 2.0  PHOS  --   --   --  4.4  --  4.4 3.9 3.5   GFR: Estimated Creatinine Clearance: 86.7 mL/min (by C-G formula based on SCr of 0.56 mg/dL). Recent Labs  Lab 11/07/18 0200 11/08/18 0430 11/11/18 0355 11/12/18 0403  WBC 9.9 8.1 10.1 9.4    Liver Function Tests: Recent Labs  Lab 11/06/18 0230 11/07/18 0200 11/08/18 0430  AST 31 29 26   ALT 34 30 28  ALKPHOS 112 98 86  BILITOT 0.5 0.8 0.7  PROT 6.2* 6.2* 5.2*  ALBUMIN 2.9* 2.9* 2.6*   No results for input(s): LIPASE, AMYLASE in the last 168 hours. No results for input(s): AMMONIA in the last 168 hours.  ABG    Component Value Date/Time   PHART 7.432 11/10/2018 1722   PCO2ART 36.0 11/10/2018 1722   PO2ART 77.0 (L) 11/10/2018 1722   HCO3 23.9 11/10/2018 1722   TCO2 25 11/10/2018 1722   ACIDBASEDEF 1.0 10/24/2018 0419   O2SAT 95.0 11/10/2018 1722     Coagulation Profile: No results for input(s): INR, PROTIME in the last 168 hours.  Cardiac Enzymes: No results for input(s): CKTOTAL, CKMB, CKMBINDEX, TROPONINI in the last 168 hours.  HbA1C: No results found for: HGBA1C  CBG: Recent Labs  Lab 11/11/18 0737 11/11/18 1130 11/11/18 1938 11/11/18 2309 11/12/18 0346  GLUCAP 112* 118* 100* 133* 108*     Critical care time: 32 minutes     Heber CarolinaBrent Kylar Speelman, MD [place] [facility] Pager: [phone] Cell: [phone] If no response, call [phone]

## 2018-11-12 NOTE — Progress Notes (Signed)
PT Cancellation Note  Patient Details Name: Shannon Meyer MRN: [id number] DOB: 25-Nov-1965   Cancelled Treatment:    Reason Eval/Treat Not Completed: Medical issues which prohibited therapy   Attempted to see ~1400 and RN reported pt too sedated. Noted to have trach 11/13/18. Will continue to follow and see as appropriate   Rexanne Mano, PT 11/12/2018, 4:34 PM

## 2018-11-12 NOTE — Progress Notes (Signed)
Speech Pathology: Pt remains intubated; trach planned for next date.  Will sign off and await new SLP orders when appropriate.  Fritz Cauthon L. Tivis Ringer, [place] Office number [phone]

## 2018-11-12 NOTE — Plan of Care (Signed)

## 2018-11-12 NOTE — Procedures (Signed)
Cortrak  Person Inserting Tube:  Shannon Meyer, RD Tube Type:  Cortrak - 43 inches Tube Location:  Right nare Initial Placement:  Postpyloric Secured by: Bridle Technique Used to Measure Tube Placement:  Documented cm marking at nare/ corner of mouth Cortrak Secured At:  83 cm    Cortrak Tube Team Note:  Consult received to place a Cortrak feeding tube.   No x-ray is required. RN may begin using tube.   If the tube becomes dislodged please keep the tube and contact the Cortrak team at [email or web address] (password [email or web address]) for replacement.  If after hours and replacement cannot be delayed, place a NG tube and confirm placement with an abdominal x-ray.    Shannon Meyer RD, LDN, CNSC [phone] Pager [phone] After Hours Pager   

## 2018-11-12 NOTE — Progress Notes (Signed)
[facility]  [place], had to leave a message  Roselie Awkward, MD [place] P[facility]M Pager: [phone] Cell: [phone] If no response, call [phone]

## 2018-11-13 ENCOUNTER — Inpatient Hospital Stay (HOSPITAL_COMMUNITY): Payer: HRSA Program

## 2018-11-13 DIAGNOSIS — Z930 Tracheostomy status: Secondary | ICD-10-CM

## 2018-11-13 LAB — BASIC METABOLIC PANEL
Anion gap: 8 (ref 5–15)
BUN: 33 mg/dL — ABNORMAL HIGH (ref 6–20)
CO2: 25 mmol/L (ref 22–32)
Calcium: 8.4 mg/dL — ABNORMAL LOW (ref 8.9–10.3)
Chloride: 109 mmol/L (ref 98–111)
Creatinine, Ser: 0.46 mg/dL (ref 0.44–1.00)
GFR calc Af Amer: >60 mL/min (ref >60)
GFR calc non Af Amer: >60 mL/min (ref >60)
Glucose, Bld: 112 mg/dL — ABNORMAL HIGH (ref 70–99)
Potassium: 3.7 mmol/L (ref 3.5–5.1)
Sodium: 142 mmol/L (ref 135–145)

## 2018-11-13 LAB — CBC
HCT: 36.1 % (ref 36.0–46.0)
Hemoglobin: 10.9 g/dL — ABNORMAL LOW (ref 12.0–15.0)
MCH: 28.8 pg (ref 26.0–34.0)
MCHC: 30.2 g/dL (ref 30.0–36.0)
MCV: 95.3 fL (ref 80.0–100.0)
Platelets: 125 10*3/uL — ABNORMAL LOW (ref 150–400)
RBC: 3.79 MIL/uL — ABNORMAL LOW (ref 3.87–5.11)
RDW: 18.3 % — ABNORMAL HIGH (ref 11.5–15.5)
WBC: 6.6 10*3/uL (ref 4.0–10.5)
nRBC: 0 % (ref 0.0–0.2)

## 2018-11-13 LAB — GLUCOSE, CAPILLARY
Glucose-Capillary: 101 mg/dL — ABNORMAL HIGH (ref 70–99)
Glucose-Capillary: 102 mg/dL — ABNORMAL HIGH (ref 70–99)
Glucose-Capillary: 106 mg/dL — ABNORMAL HIGH (ref 70–99)
Glucose-Capillary: 112 mg/dL — ABNORMAL HIGH (ref 70–99)
Glucose-Capillary: 94 mg/dL (ref 70–99)

## 2018-11-13 LAB — PROTIME-INR
INR: 0.9 (ref 0.8–1.2)
Prothrombin Time: 12.4 seconds (ref 11.4–15.2)

## 2018-11-13 MED ORDER — ROCURONIUM BROMIDE 50 MG/5ML IV SOLN
75.0000 mg | Freq: Once | INTRAVENOUS | Status: AC
  Administered 2018-11-13: 14:00:00 75 mg via INTRAVENOUS

## 2018-11-13 MED ORDER — PROPOFOL 1000 MG/100ML IV EMUL
INTRAVENOUS | Status: AC
  Filled 2018-11-13: qty 100

## 2018-11-13 MED ORDER — ROCURONIUM BROMIDE 10 MG/ML (PF) SYRINGE
PREFILLED_SYRINGE | INTRAVENOUS | Status: AC
  Filled 2018-11-13: qty 10

## 2018-11-13 MED ORDER — DEXMEDETOMIDINE HCL IN NACL 400 MCG/100ML IV SOLN
0.0000 ug/kg/h | INTRAVENOUS | Status: AC
  Administered 2018-11-13 (×2): 1.2 ug/kg/h via INTRAVENOUS
  Administered 2018-11-14: 18:00:00 1 ug/kg/h via INTRAVENOUS
  Administered 2018-11-14 (×3): 1.2 ug/kg/h via INTRAVENOUS
  Administered 2018-11-14: 0.7 ug/kg/h via INTRAVENOUS
  Administered 2018-11-14: 23:00:00 1 ug/kg/h via INTRAVENOUS
  Administered 2018-11-15 (×2): 0.8 ug/kg/h via INTRAVENOUS
  Administered 2018-11-15: 1 ug/kg/h via INTRAVENOUS
  Administered 2018-11-15: 09:00:00 0.6 ug/kg/h via INTRAVENOUS
  Administered 2018-11-16 (×3): 0.8 ug/kg/h via INTRAVENOUS
  Filled 2018-11-13 (×13): qty 100

## 2018-11-13 NOTE — Progress Notes (Addendum)
PROGRESS NOTE  Shannon BradfordDeborah Dillow  [id number] DOB: 04/16/1966 DOA: 10/22/2018 PCP: No primary care provider on file.   Brief Narrative: Shannon Meyer is a 53 y.o. female with no significant medical history who presented with cough, weakness and shortness of breath found to have covid-19 with bilateral CXR opacities, [facility] ED [facility]   Assessment & Plan: Principal Problem:   COVID-19 Active Problems:   Leukocytosis   Sepsis (HCC)   Elevated LFTs   QT prolongation   Acute respiratory failure with hypoxia (HCC)   Pressure injury of skin  Acute hypoxic respiratory failure due to covid-19 pneumonia: Failed extubation 6/8. - Complete remdesivir 5/21 - 5/30, received actemra 5/20, and 5/21. Received CTX 5/20-5/26, azithromycin 5/20-5/24. - Continue airborne, contact precautions. PPE including surgical gown, gloves, face shield, cap, shoe covers, and N-95 used during this encounter in a negative pressure room.  - Maintain euvolemia/net negative.  - Avoid NSAIDs  - Ventilator settings per PCCM, decrease TV as able to decrease pressures. Tracheostomy planned today.  - Diurese as able.  Hypernatremia:  - Continue free water  Hypokalemia:  - Supplement and monitor  Positive blood cultures: 1 of 4 bottles, suspect CoNS contaminant.   Thrombocytopenia: Mild. Plt stable. - Ok to continue lovenox 40mg  q12h per covid ICU protocol.  Obesity: BMI 34.  - Optimize nutritional status  DVT prophylaxis: Lovenox Code Status: Full Family Communication: Called daughter without answer today. Called again the mobile and home phone numbers listed in the chart, no answer. Disposition Plan: Remain in ICU  Consultants:   PCCM  Procedures:   5/20 ETT  5/24 R PICC line  6/11 Tracheostomy  Antimicrobials:  Azithromycin 5/20 >> 5/24  Rocephin 5/20 >> 5/26  Remdesivir 5/25 >> 5/30  Actemra 5/20 and 5/21  Diflucan 5/30 >> 6/1   Subjective: No new events.    Objective: Vitals:   11/13/18 1130 11/13/18 1145 11/13/18 1200 11/13/18 1217  BP: (!) 144/79 (!) 156/86 (!) 146/79 123/83  Pulse: 61 66 60 78  Resp: 10 20 20 20   Temp:   97.8 F (36.6 C)   TempSrc:   Axillary   SpO2: 97% 99% 98% 96%  Weight:      Height:        Intake/Output Summary (Last 24 hours) at 11/13/2018 1407 Last data filed at 11/13/2018 1052 Gross per 24 hour  Intake 1488.22 ml  Output 1350 ml  Net 138.22 ml   Filed Weights   11/11/18 0500 11/12/18 0348 11/13/18 0448  Weight: 92.1 kg 90.6 kg 91.5 kg   Gen: 53 y.o. female  resting quietly Pulm: Nonlabored on the vent. Clear. CV: Regular rate and rhythm. No murmur, rub, or gallop. No JVD, trace dependent edema. GI: Abdomen soft, non-tender, non-distended, with normoactive bowel sounds.  Ext: Warm, no deformities Skin: No rashes, lesions or ulcers on visualized skin. Neuro: Rousable, follows limited commands, sedated. Psych: UTD  Data Reviewed: I have personally reviewed following labs and imaging studies  CBC: Recent Labs  Lab 11/07/18 0200 11/08/18 0430 11/10/18 1722 11/11/18 0355 11/12/18 0403 11/13/18 0330  WBC 9.9 8.1  --  10.1 9.4 6.6  NEUTROABS 5.5 4.5  --   --   --   --   HGB 12.5 10.9* 13.6 11.4* 10.7* 10.9*  HCT 40.5 37.2 40.0 38.6 35.7* 36.1  MCV 93.3 94.4  --  94.8 94.7 95.3  PLT 153 134*  --  153 124* 125*   Basic Metabolic Panel:  Recent Labs  Lab 11/07/18 0200 11/08/18 0430 11/10/18 0200 11/10/18 1657 11/10/18 1722 11/11/18 0355 11/11/18 1640 11/12/18 0403 11/13/18 0330  NA 139 146* 143  --  142 142  --  141 142  K 2.7* 3.0* 3.6  --  3.8 3.3*  --  3.7 3.7  CL 101 114* 113*  --   --  111  --  111 109  CO2 30 26 24   --   --  24  --  25 25  GLUCOSE 129* 115* 112*  --   --  115*  --  97 112*  BUN 63* 46* 34*  --   --  32*  --  30* 33*  CREATININE 0.69 0.59 0.48  --   --  0.62  --  0.56 0.46  CALCIUM 8.7* 8.3* 8.7*  --   --  8.3*  --  8.0* 8.4*  MG 2.2  --   --  1.9  --  2.2 2.2  2.0  --   PHOS  --   --   --  4.4  --  4.4 3.9 3.5  --    GFR: Estimated Creatinine Clearance: 87.1 mL/min (by C-G formula based on SCr of 0.46 mg/dL). Liver Function Tests: Recent Labs  Lab 11/07/18 0200 11/08/18 0430  AST 29 26  ALT 30 28  ALKPHOS 98 86  BILITOT 0.8 0.7  PROT 6.2* 5.2*  ALBUMIN 2.9* 2.6*   No results for input(s): LIPASE, AMYLASE in the last 168 hours. No results for input(s): AMMONIA in the last 168 hours. Coagulation Profile: Recent Labs  Lab 11/13/18 0854  INR 0.9   Cardiac Enzymes: No results for input(s): CKTOTAL, CKMB, CKMBINDEX, TROPONINI in the last 168 hours. BNP (last 3 results) No results for input(s): PROBNP in the last 8760 hours. HbA1C: No results for input(s): HGBA1C in the last 72 hours. CBG: Recent Labs  Lab 11/12/18 1508 11/12/18 1935 11/13/18 0038 11/13/18 0840 11/13/18 1206  GLUCAP 127* 105* 101* 106* 94   Lipid Profile: No results for input(s): CHOL, HDL, LDLCALC, TRIG, CHOLHDL, LDLDIRECT in the last 72 hours. Thyroid Function Tests: No results for input(s): TSH, T4TOTAL, FREET4, T3FREE, THYROIDAB in the last 72 hours. Anemia Panel: No results for input(s): VITAMINB12, FOLATE, FERRITIN, TIBC, IRON, RETICCTPCT in the last 72 hours. Urine analysis:    Component Value Date/Time   COLORURINE STRAW (A) 11/01/2018 0901   APPEARANCEUR HAZY (A) 11/01/2018 0901   LABSPEC 1.010 11/01/2018 0901   PHURINE 5.0 11/01/2018 0901   GLUCOSEU NEGATIVE 11/01/2018 0901   HGBUR NEGATIVE 11/01/2018 0901   BILIRUBINUR NEGATIVE 11/01/2018 0901   KETONESUR NEGATIVE 11/01/2018 0901   PROTEINUR NEGATIVE 11/01/2018 0901   NITRITE NEGATIVE 11/01/2018 0901   LEUKOCYTESUR NEGATIVE 11/01/2018 0901   No results found for this or any previous visit (from the past 240 hour(s)).    Radiology Studies: No results found.  Scheduled Meds: . chlorhexidine  15 mL Mouth/Throat BID  . Chlorhexidine Gluconate Cloth  6 each Topical Daily  . docusate  100  mg Per Tube BID  . enoxaparin (LOVENOX) injection  40 mg Subcutaneous Q12H  . etomidate  40 mg Intravenous Once  . famotidine  20 mg Per Tube Q12H  . feeding supplement (PRO-STAT SUGAR FREE 64)  60 mL Per Tube Daily  . feeding supplement (VITAL HIGH PROTEIN)  1,000 mL Per Tube Q24H  . fentaNYL (SUBLIMAZE) injection  200 mcg Intravenous Once  . furosemide  40 mg Oral Daily  .  insulin aspart  0-9 Units Subcutaneous Q4H  . mouth rinse  15 mL Mouth Rinse 10 times per day  . midazolam  5 mg Intravenous Once  . multivitamin  15 mL Per Tube Daily  . potassium chloride  40 mEq Oral Daily  . rocuronium bromide      . sodium chloride flush  10-40 mL Intracatheter Q12H  . sodium chloride flush  3 mL Intravenous Q12H  . vecuronium  10 mg Intravenous Once   Continuous Infusions: . sodium chloride 10 mL/hr at 11/13/18 1000  . dexmedetomidine (PRECEDEX) IV infusion 1.2 mcg/kg/hr (11/13/18 1049)  . norepinephrine (LEVOPHED) Adult infusion Stopped (11/13/18 0100)  . propofol       LOS: 22 days   Time spent: 35 minutes.  Tyrone Nineyan B Brinn Westby, MD [facility] [email or web address] Password [facility] 11/13/2018, 2:07 PM

## 2018-11-13 NOTE — Procedures (Signed)
Procedure done by P Halona Amstutz ACNP-BC, under direct supervision of Dr Nelda Marseille. At first bronch was introduce through ET tube and structures of tracheal rings, carina identified for operator of tracheostomy who was Dr Nelda Marseille. Light of bronch passed through trachea and skin for indentification of tracheal rings for tracheostomy puncture. After this, under bronchoscopy guidance,  ET tube was pulled back sufficiently and very carefully. The ET tube was  pulled back enough to give room for tracheostomy operator (at every point the tube was extracted mechanical ventilation was placed on hold to minimize aeresolation and yet at same time to to ensure a secured airway. After this was accomplished, bronchoscope was withdrawn into the ET tube. After this,  Dr Nelda Marseille then performed tracheostomy under video visual provided by flexible video bronchoscopy. Followng introduction of tracheostomy,  the bronchoscope was removed from ET tube and introduced through tracheostomy. Correct position of tracheostomy was ensured, with enough room between carina and distal tracheostomy and no evidence of bleeding. The bronchoscope was then withdrawn. Respiratory therapist was then instructed to remove the ET tube.  Dr Nelda Marseille then proceeded to complete the tracheostomy with stay sutures  Erick Colace ACNP-BC [place] Pager # [phone] OR # [phone] if no answer   No complications

## 2018-11-13 NOTE — Progress Notes (Signed)
Daughter called to get an updated on patient. All questions answered.

## 2018-11-13 NOTE — Progress Notes (Signed)
Nutrition Follow-up  DOCUMENTATION CODES:   Obesity unspecified  INTERVENTION:   Continue TF via Cortrak tube:   Vital High Protein at 40 ml/h (960 ml per day)   Pro-stat 60 ml once daily   Provides 1160 kcal, 114 gm protein, 803 ml free water daily  NUTRITION DIAGNOSIS:   Inadequate oral intake related to inability to eat as evidenced by NPO status.  Ongoing  GOAL:   Provide needs based on ASPEN/SCCM guidelines  Met with TF  MONITOR:   Vent status, TF tolerance, Labs, Skin  ASSESSMENT:   53 yo female with no significant PMH who was admitted with COVID-19.  S/P Cortrak tube placement 6/10, tip is post-pyloric. TF currently on hold for trach placement today. Patient has been tolerating TF without difficulty via Cortrak tube.  Patient remains intubated on ventilator support Temp (24hrs), Avg:98 F (36.7 C), Min:97.5 F (36.4 C), Max:98.3 F (36.8 C)   Labs reviewed. BUN 30 (H) CBG's: 101-106  Medications reviewed and include Lasix, MVI, Novolog, Levophed (currently off).   Weight down ~7 lbs overall since admission I/O net negative 3.7 L  NUTRITION - FOCUSED PHYSICAL EXAM:  deferred  Diet Order:   Diet Order            Diet NPO time specified  Diet effective midnight              EDUCATION NEEDS:   No education needs have been identified at this time  Skin:  Skin Assessment: Skin Integrity Issues: Skin Integrity Issues:: Stage II Stage II: perineum  Last BM:  6/5  Height:   Ht Readings from Last 1 Encounters:  10/22/18 5' 4" (1.626 m)    Weight:   Wt Readings from Last 1 Encounters:  11/13/18 91.5 kg    Ideal Body Weight:  54.5 kg  BMI:  Body mass index is 34.63 kg/m.  Estimated Nutritional Needs:   Kcal:  2482-5003  Protein:  109 gm  Fluid:  >/= 1.6 L    Molli Barrows, RD, LDN, [place] Pager [phone] After Hours Pager [phone]

## 2018-11-13 NOTE — Progress Notes (Signed)
NAME:  Shannon BradfordDeborah Meyer, MRN:  [id number], DOB:  10/02/1965, LOS: 22 ADMISSION DATE:  10/22/2018, CONSULTATION DATE: Oct 22, 2018 REFERRING MD:  Pilar PlateBero, CHIEF COMPLAINT: Dyspnea  Brief History   53 year old female with no past medical history admitted for med [facility] [place] on Oct 22, 2018 with acute respiratory failure with hypoxemia due to COVID-19 pneumonia.  Past Medical History  No known past medical history  Significant Hospital Events   5/20 Intubated in ED and [facility] 5/21-5/26 Weaning FIO2 and PEEP, diuresing 5/26-6/3 Tolerating weaning for 1-2 hours 6/7 weaning on pressure support ventilation again this morning 10/5 6/8 extubated, reintubated (tachypnea, tachycardia)  Consults:  Pulmonary and critical care medicine  Procedures:  5/20 ET tube>  5/24 PICC line right arm>   Significant Diagnostic Tests:  Oct 28, 2018 CT angiogram chest no pulmonary embolism, bilateral atypical pneumonia consistent with COVID-19  Micro Data:  SARS-CoV-2 May 20+ Blood culture May 20 1 out of 2 GPC staph, coag negative  Antimicrobials:  Zithromax 5/20 >>5/24 Rocephin 5/20 >>5/26 Remdesivir 5/21 >5/30 Actemra 5/20>5/21  Interim history/subjective:  Reamins on vent. Failed weaning  Objective   Blood pressure 120/70, pulse 85, temperature (!) 97.5 F (36.4 C), resp. rate (!) 33, height 5\' 4"  (1.626 m), weight 91.5 kg, SpO2 97 %.    Vent Mode: PRVC FiO2 (%):  [40 %] 40 % Set Rate:  [20 bmp] 20 bmp Vt Set:  [430 mL] 430 mL PEEP:  [5 cmH20] 5 cmH20 Plateau Pressure:  [35 cmH20-43 cmH20] 43 cmH20   Intake/Output Summary (Last 24 hours) at 11/13/2018 0816 Last data filed at 11/13/2018 0600 Gross per 24 hour  Intake 1553.76 ml  Output 1450 ml  Net 103.76 ml   Filed Weights   11/11/18 0500 11/12/18 0348 11/13/18 0448  Weight: 92.1 kg 90.6 kg 91.5 kg    Examination: Gen:      No acute distress HEENT:  EOMI, sclera anicteric Neck:     No  masses; no thyromegaly, ETT Lungs:    Clear to auscultation bilaterally; normal respiratory effort CV:         Regular rate and rhythm; no murmurs Abd:      + bowel sounds; soft, non-tender; no palpable masses, no distension Ext:    No edema; adequate peripheral perfusion Skin:      Warm and dry; no rash Neuro: Awake, responsive  Resolved Hospital Problem list     Assessment & Plan:  Acute respiratory failure with hypoxemia due to COVID-19 pneumonia: slow improvement Oxygenation has improved but lungs remain stiff with very poor compliance For trach today Hopeful to start PSV trails tomorrow  Delirium, encephalopathy Precedex, ativan PRN  Best practice:  Diet: tube feeding via cortrak Pain/Anxiety/Delirium protocol (if indicated): RASS 0 to -1 VAP protocol (if indicated): yes DVT prophylaxis: lovenox bid GI prophylaxis: famotidine Glucose control: SSI Mobility: PT consult, range of motion exercises Code Status: full Family Communication: Pending Disposition: remain in ICU   Labs   CBC: Recent Labs  Lab 11/07/18 0200 11/08/18 0430 11/10/18 1722 11/11/18 0355 11/12/18 0403 11/13/18 0330  WBC 9.9 8.1  --  10.1 9.4 6.6  NEUTROABS 5.5 4.5  --   --   --   --   HGB 12.5 10.9* 13.6 11.4* 10.7* 10.9*  HCT 40.5 37.2 40.0 38.6 35.7* 36.1  MCV 93.3 94.4  --  94.8 94.7 95.3  PLT 153 134*  --  153 124* 125*  Basic Metabolic Panel: Recent Labs  Lab 11/07/18 0200 11/08/18 0430 11/10/18 0200 11/10/18 1657 11/10/18 1722 11/11/18 0355 11/11/18 1640 11/12/18 0403 11/13/18 0330  NA 139 146* 143  --  142 142  --  141 142  K 2.7* 3.0* 3.6  --  3.8 3.3*  --  3.7 3.7  CL 101 114* 113*  --   --  111  --  111 109  CO2 30 26 24   --   --  24  --  25 25  GLUCOSE 129* 115* 112*  --   --  115*  --  97 112*  BUN 63* 46* 34*  --   --  32*  --  30* 33*  CREATININE 0.69 0.59 0.48  --   --  0.62  --  0.56 0.46  CALCIUM 8.7* 8.3* 8.7*  --   --  8.3*  --  8.0* 8.4*  MG 2.2  --   --   1.9  --  2.2 2.2 2.0  --   PHOS  --   --   --  4.4  --  4.4 3.9 3.5  --    GFR: Estimated Creatinine Clearance: 87.1 mL/min (by C-G formula based on SCr of 0.46 mg/dL). Recent Labs  Lab 11/08/18 0430 11/11/18 0355 11/12/18 0403 11/13/18 0330  WBC 8.1 10.1 9.4 6.6    Liver Function Tests: Recent Labs  Lab 11/07/18 0200 11/08/18 0430  AST 29 26  ALT 30 28  ALKPHOS 98 86  BILITOT 0.8 0.7  PROT 6.2* 5.2*  ALBUMIN 2.9* 2.6*   No results for input(s): LIPASE, AMYLASE in the last 168 hours. No results for input(s): AMMONIA in the last 168 hours.  ABG    Component Value Date/Time   PHART 7.432 11/10/2018 1722   PCO2ART 36.0 11/10/2018 1722   PO2ART 77.0 (L) 11/10/2018 1722   HCO3 23.9 11/10/2018 1722   TCO2 25 11/10/2018 1722   ACIDBASEDEF 1.0 10/24/2018 0419   O2SAT 95.0 11/10/2018 1722     Coagulation Profile: No results for input(s): INR, PROTIME in the last 168 hours.  Cardiac Enzymes: No results for input(s): CKTOTAL, CKMB, CKMBINDEX, TROPONINI in the last 168 hours.  HbA1C: No results found for: HGBA1C  CBG: Recent Labs  Lab 11/12/18 0731 11/12/18 1127 11/12/18 1508 11/12/18 1935 11/13/18 0038  GLUCAP 125* 109* 127* 105* 101*    The patient is critically ill with multiple organ system failure and requires high complexity decision making for assessment and support, frequent evaluation and titration of therapies, advanced monitoring, review of radiographic studies and interpretation of complex data.   Critical Care Time devoted to patient care services, exclusive of separately billable procedures, described in this note is 35 minutes.   Marshell Garfinkel MD [place] [facility] Pager [phone] If no answer call [phone] 11/13/2018, 8:17 AM

## 2018-11-13 NOTE — Plan of Care (Signed)

## 2018-11-13 NOTE — Procedures (Signed)
Bedside Tracheostomy Insertion Procedure Note   Patient Details:   Name: Shannon Meyer DOB: 03-01-1966 MRN: [id number]  Procedure: Tracheostomy  Pre Procedure Assessment: ET Tube Size: 7.5 ET Tube secured at lip (cm): 24 Bite block in place: No Breath Sounds: Clear and Diminished  Post Procedure Assessment: BP 123/83   Pulse 78   Temp 97.8 F (36.6 C) (Axillary)   Resp 20   Ht 5\' 4"  (1.626 m)   Wt 91.5 kg   SpO2 96%   BMI 34.63 kg/m  O2 sats: stable throughout Complications: No apparent complications Patient did tolerate procedure well Tracheostomy Brand:Shiley Tracheostomy Style:Cuffed Tracheostomy Size: 6.0 Tracheostomy Secured TGP:[id number], velcro Tracheostomy Placement Confirmation:Trach cuff visualized and in place and Chest X ray ordered for placement    Mickie Hillier 11/13/2018, 2:50 PM

## 2018-11-13 NOTE — Procedures (Signed)
Percutaneous Tracheostomy Placement  Consent from family.  Patient sedated, paralyzed and position.  Placed on 100% FiO2 and RR matched.  Area cleaned and draped.  Lidocaine/epi injected.  Skin incision done followed by blunt dissection.  Trachea palpated then punctured, catheter passed and visualized bronchoscopically.  Wire placed and visualized.  Catheter removed.  Airway then entered and dilated.  Size 6 cuffed shiley trach placed and visualized bronchoscopically well above carina.  Good volume returns.  Patient tolerated the procedure well without complications.  Minimal blood loss.  CXR ordered and pending.  Wesam G. Yacoub, M.D. [place] [facility]. Pager: [phone]. After hours pager: [phone].  

## 2018-11-13 NOTE — Progress Notes (Signed)
Spoke with pt's daughter [name] and updated her about the time of her mother's tracheostomy placement and answered any questions she had. Will notify her after procedure is complete for another update.

## 2018-11-13 NOTE — Progress Notes (Signed)
Spoke with pt's daughter [name] and updated her on the tracheostomy placement and how her mother is doing.

## 2018-11-13 NOTE — Progress Notes (Signed)
Daughter [name] called and was updated on patient. All questions were answered.

## 2018-11-13 NOTE — Progress Notes (Signed)
Spoke with patients sister and updated her about plan of care for the day and also scheduled time for tracheostomy placement.

## 2018-11-14 DIAGNOSIS — G9341 Metabolic encephalopathy: Secondary | ICD-10-CM

## 2018-11-14 DIAGNOSIS — I959 Hypotension, unspecified: Secondary | ICD-10-CM

## 2018-11-14 LAB — BASIC METABOLIC PANEL
Anion gap: 8 (ref 5–15)
BUN: 27 mg/dL — ABNORMAL HIGH (ref 6–20)
CO2: 24 mmol/L (ref 22–32)
Calcium: 8.2 mg/dL — ABNORMAL LOW (ref 8.9–10.3)
Chloride: 108 mmol/L (ref 98–111)
Creatinine, Ser: 0.51 mg/dL (ref 0.44–1.00)
GFR calc Af Amer: >60 mL/min (ref >60)
GFR calc non Af Amer: >60 mL/min (ref >60)
Glucose, Bld: 112 mg/dL — ABNORMAL HIGH (ref 70–99)
Potassium: 3.4 mmol/L — ABNORMAL LOW (ref 3.5–5.1)
Sodium: 140 mmol/L (ref 135–145)

## 2018-11-14 LAB — CBC
HCT: 36.1 % (ref 36.0–46.0)
Hemoglobin: 11.3 g/dL — ABNORMAL LOW (ref 12.0–15.0)
MCH: 29.6 pg (ref 26.0–34.0)
MCHC: 31.3 g/dL (ref 30.0–36.0)
MCV: 94.5 fL (ref 80.0–100.0)
Platelets: 126 10*3/uL — ABNORMAL LOW (ref 150–400)
RBC: 3.82 MIL/uL — ABNORMAL LOW (ref 3.87–5.11)
RDW: 17.8 % — ABNORMAL HIGH (ref 11.5–15.5)
WBC: 9 10*3/uL (ref 4.0–10.5)
nRBC: 0 % (ref 0.0–0.2)

## 2018-11-14 LAB — GLUCOSE, CAPILLARY
Glucose-Capillary: 103 mg/dL — ABNORMAL HIGH (ref 70–99)
Glucose-Capillary: 105 mg/dL — ABNORMAL HIGH (ref 70–99)
Glucose-Capillary: 106 mg/dL — ABNORMAL HIGH (ref 70–99)
Glucose-Capillary: 107 mg/dL — ABNORMAL HIGH (ref 70–99)
Glucose-Capillary: 92 mg/dL (ref 70–99)

## 2018-11-14 LAB — BRAIN NATRIURETIC PEPTIDE: B Natriuretic Peptide: 130.2 pg/mL — ABNORMAL HIGH (ref 0.0–100.0)

## 2018-11-14 MED ORDER — QUETIAPINE FUMARATE 50 MG PO TABS
100.0000 mg | ORAL_TABLET | Freq: Two times a day (BID) | ORAL | Status: DC
  Administered 2018-11-14 – 2018-11-17 (×8): 100 mg via ORAL
  Filled 2018-11-14 (×8): qty 2

## 2018-11-14 MED ORDER — OXYCODONE HCL 5 MG PO TABS
5.0000 mg | ORAL_TABLET | Freq: Three times a day (TID) | ORAL | Status: DC
  Administered 2018-11-14 (×3): 5 mg
  Filled 2018-11-14 (×4): qty 1

## 2018-11-14 MED ORDER — METOPROLOL TARTRATE 5 MG/5ML IV SOLN
2.5000 mg | Freq: Four times a day (QID) | INTRAVENOUS | Status: DC | PRN
  Administered 2018-11-14 – 2018-11-15 (×2): 2.5 mg via INTRAVENOUS
  Filled 2018-11-14 (×2): qty 5

## 2018-11-14 MED ORDER — SODIUM CHLORIDE 0.9 % IV BOLUS
250.0000 mL | Freq: Once | INTRAVENOUS | Status: AC
  Administered 2018-11-14: 16:00:00 250 mL via INTRAVENOUS

## 2018-11-14 MED ORDER — MIDAZOLAM HCL 2 MG/2ML IJ SOLN: 2.0000 mg | Freq: Once | INTRAMUSCULAR | Status: DC

## 2018-11-14 NOTE — Progress Notes (Signed)
NAME:  Shannon BradfordDeborah Cid, MRN:  [id number], DOB:  12/17/1965, LOS: 23 ADMISSION DATE:  10/22/2018, CONSULTATION DATE: Oct 22, 2018 REFERRING MD:  Pilar PlateBero, CHIEF COMPLAINT: Dyspnea  Brief History   53 year old female with no past medical history [facility] Oct [facility], 2018 with acute respiratory failure with hypoxemia due to COVID-19 pneumonia.  Past Medical History  No known past medical history  Significant Hospital Events   5/20 Intubated in ED and [facility] 5/21-5/26 Weaning FIO2 and PEEP, diuresing 5/26-6/3 Tolerating weaning for 1-2 hours 6/7 weaning on pressure support ventilation again this morning 10/5 6/8 extubated, reintubated (tachypnea, tachycardia) 6/12 Trach  Consults:  Pulmonary and critical care medicine  Procedures:  5/20 ET tube> 6/11  Trach 6/11 >> 5/24 PICC line right arm>   Significant Diagnostic Tests:  Oct 28, 2018 CT angiogram chest no pulmonary embolism, bilateral atypical pneumonia consistent with COVID-19  Micro Data:  SARS-CoV-2 May 20+ Blood culture May 20 1 out of 2 GPC staph, coag negative  Antimicrobials:  Zithromax 5/20 >>5/24 Rocephin 5/20 >>5/26 Remdesivir 5/21 >5/30 Actemra 5/20>5/21  Interim history/subjective:  Trach yesterday without any complication. Has some bleeding from tach site but better today. Agitated today AM  Objective   Blood pressure 113/85, pulse 73, temperature 98.7 F (37.1 C), temperature source Oral, resp. rate (!) 22, height 5\' 4"  (1.626 m), weight 92.9 kg, SpO2 98 %.    Vent Mode: PRVC FiO2 (%):  [40 %-100 %] 50 % Set Rate:  [20 bmp] 20 bmp Vt Set:  [430 mL-440 mL] 440 mL PEEP:  [5 cmH20] 5 cmH20 Plateau Pressure:  [33 cmH20-39 cmH20] 36 cmH20   Intake/Output Summary (Last 24 hours) at 11/14/2018 0822 Last data filed at 11/14/2018 0537 Gross per 24 hour  Intake 1280.16 ml  Output 1550 ml  Net -269.84 ml   Filed Weights   11/12/18 0348 11/13/18  0448 11/14/18 0500  Weight: 90.6 kg 91.5 kg 92.9 kg    Examination: Gen:      No acute distress HEENT:  EOMI, sclera anicteric Neck:     No masses; no thyromegaly, trach with dried clotted blood. Lungs:    Clear to auscultation bilaterally; normal respiratory effort CV:         Regular rate and rhythm; no murmurs Abd:      + bowel sounds; soft, non-tender; no palpable masses, no distension Ext:    No edema; adequate peripheral perfusion Skin:      Warm and dry; no rash Neuro: Awake, responsive  Resolved Hospital Problem list     Assessment & Plan:  Acute respiratory failure with hypoxemia due to COVID-19 pneumonia Very low lung compliance. 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 Follow qtc  Best practice:  Diet: tube feeding via cortrak Pain/Anxiety/Delirium protocol (if indicated): RASS goal 0 VAP protocol (if indicated): yes DVT prophylaxis: lovenox bid GI prophylaxis: famotidine Glucose control: SSI Mobility: PT consult, range of motion exercises Code Status: full Family Communication: Pending Disposition: remain in ICU   Labs   CBC: Recent Labs  Lab 11/08/18 0430 11/10/18 1722 11/11/18 0355 11/12/18 0403 11/13/18 0330 11/14/18 0220  WBC 8.1  --  10.1 9.4 6.6 9.0  NEUTROABS 4.5  --   --   --   --   --   HGB 10.9* 13.6 11.4* 10.7* 10.9* 11.3*  HCT 37.2 40.0  38.6 35.7* 36.1 36.1  MCV 94.4  --  94.8 94.7 95.3 94.5  PLT 134*  --  153 124* 125* 126*    Basic Metabolic Panel: Recent Labs  Lab 11/10/18 0200 11/10/18 1657 11/10/18 1722 11/11/18 0355 11/11/18 1640 11/12/18 0403 11/13/18 0330 11/14/18 0220  NA 143  --  142 142  --  141 142 140  K 3.6  --  3.8 3.3*  --  3.7 3.7 3.4*  CL 113*  --   --  111  --  111 109 108  CO2 24  --   --  24  --  25 25 24   GLUCOSE 112*  --   --  115*  --  97 112* 112*  BUN 34*  --   --  32*  --  30* 33* 27*   CREATININE 0.48  --   --  0.62  --  0.56 0.46 0.51  CALCIUM 8.7*  --   --  8.3*  --  8.0* 8.4* 8.2*  MG  --  1.9  --  2.2 2.2 2.0  --   --   PHOS  --  4.4  --  4.4 3.9 3.5  --   --    GFR: Estimated Creatinine Clearance: 87.8 mL/min (by C-G formula based on SCr of 0.51 mg/dL). Recent Labs  Lab 11/11/18 0355 11/12/18 0403 11/13/18 0330 11/14/18 0220  WBC 10.1 9.4 6.6 9.0    Liver Function Tests: Recent Labs  Lab 11/08/18 0430  AST 26  ALT 28  ALKPHOS 86  BILITOT 0.7  PROT 5.2*  ALBUMIN 2.6*   No results for input(s): LIPASE, AMYLASE in the last 168 hours. No results for input(s): AMMONIA in the last 168 hours.  ABG    Component Value Date/Time   PHART 7.432 11/10/2018 1722   PCO2ART 36.0 11/10/2018 1722   PO2ART 77.0 (L) 11/10/2018 1722   HCO3 23.9 11/10/2018 1722   TCO2 25 11/10/2018 1722   ACIDBASEDEF 1.0 10/24/2018 0419   O2SAT 95.0 11/10/2018 1722     Coagulation Profile: Recent Labs  Lab 11/13/18 0854  INR 0.9    Cardiac Enzymes: No results for input(s): CKTOTAL, CKMB, CKMBINDEX, TROPONINI in the last 168 hours.  HbA1C: No results found for: HGBA1C  CBG: Recent Labs  Lab 11/13/18 1206 11/13/18 1634 11/13/18 1959 11/14/18 0032 11/14/18 0749  GLUCAP 94 112* 102* 107* 106*    The patient is critically ill with multiple organ system failure and requires high complexity decision making for assessment and support, frequent evaluation and titration of therapies, advanced monitoring, review of radiographic studies and interpretation of complex data.   Critical Care Time devoted to patient care services, exclusive of separately billable procedures, described in this note is 35 minutes.   Marshell Garfinkel MD [place] [facility] Pager [phone] If no answer call [phone] 11/14/2018, 8:22 AM

## 2018-11-14 NOTE — Progress Notes (Signed)
SLP Cancellation Note  Patient Details Name: Shannon Meyer MRN: [id number] DOB: 1966/04/04   Cancelled treatment:       Reason Eval/Treat Not Completed: Other (comment) Patient with new tracheostomy 6/11. Orders for SLP eval and treat for PMSV and swallowing received. Will follow pt closely for readiness for SLP interventions as appropriate.     [name] [name] 11/14/2018, 8:26 AM   Pollyann Glen, M.A. [place] Acute Environmental education officer [phone] Office [phone]

## 2018-11-14 NOTE — Progress Notes (Signed)
Physical Therapy Treatment Patient Details Name: Shannon Meyer MRN: [id number] DOB: 05/10/1966 Today's Date: 11/14/2018    History of Present Illness Pt adm 5/20 with hypoxic resp failure due to Covid 19. Pt intubated 5/20 and extubated 6/8, with re-intubation 6/8; Trach 6/11. PMH - none.     PT Comments    Patient now s/p tracheostomy and seen while on ventilator (PRVC) and mild sedation. Patient hypotensive, however RN agreed OK to try AAROM/bed level exercises. Patient following simple commands inconsistently. Restless with RR 40-51 HR 121-139 and BP not registering. Did not complete all exercises on RLE that were completed on LLE due to increasing restlessness. RN present throughout session.    Follow Up Recommendations  CIR     Equipment Recommendations  Other (comment)    Recommendations for Other Services       Precautions / Restrictions Precautions Precautions: Fall Precaution Comments: trach, on vent    Mobility  Bed Mobility Overal bed mobility: Needs Assistance             General bed mobility comments: Repositioned pt in bed with total A +2; dangle EOB not appropriate due to hypotension, hi RR, tachycardia  Transfers                 General transfer comment: not attempted  Ambulation/Gait                 Stairs             Wheelchair Mobility    Modified Rankin (Stroke Patients Only)       Balance                                            Cognition Arousal/Alertness: Lethargic Behavior During Therapy: Restless Overall Cognitive Status: Difficult to assess                     Current Attention Level: Sustained   Following Commands: Follows one step commands inconsistently   Awareness: Intellectual Problem Solving: Slow processing;Decreased initiation        Exercises General Exercises - Upper Extremity Shoulder Flexion: AAROM;Both;10 reps;Supine Shoulder ABduction: AAROM;Both;10  reps;Supine Elbow Flexion: AAROM;Both;AROM;10 reps;Supine Elbow Extension: AROM;AAROM;Both;10 reps;Supine Digit Composite Flexion: AROM;AAROM;Both;15 reps;Supine Composite Extension: AROM;AAROM;Both;15 reps;Supine General Exercises - Lower Extremity Ankle Circles/Pumps: PROM;Both;Supine;5 reps(followed by prolonged stretch to beyond neutral DF) Heel Slides: AAROM;Left;5 reps;Supine(unable to do RLE due to incr RR, HR) Hip ABduction/ADduction: AAROM;Left;5 reps;Supine(unable to do RLE due to incr RR, HR) Other Exercises Other Exercises: PROM bil hip internal rotation in supine with pt easily moving beyond neutral    General Comments        Pertinent Vitals/Pain Pain Assessment: Faces Faces Pain Scale: Hurts little more(although shakes head "no" when asked if in pain) Pain Location: generalized Pain Descriptors / Indicators: Discomfort;Grimacing Pain Intervention(s): Limited activity within patient's tolerance    Home Living                      Prior Function            PT Goals (current goals can now be found in the care plan section) Acute Rehab PT Goals Patient Stated Goal: none stated Time For Goal Achievement: 11/24/18 Progress towards PT goals: Not progressing toward goals - comment(medical issues)    Frequency    Min 3X/week  PT Plan Current plan remains appropriate    Co-evaluation              AM-PAC PT "6 Clicks" Mobility   Outcome Measure  Help needed turning from your back to your side while in a flat bed without using bedrails?: Total Help needed moving from lying on your back to sitting on the side of a flat bed without using bedrails?: Total Help needed moving to and from a bed to a chair (including a wheelchair)?: Total Help needed standing up from a chair using your arms (e.g., wheelchair or bedside chair)?: Total Help needed to walk in hospital room?: Total Help needed climbing 3-5 steps with a railing? : Total 6 Click Score:  6    End of Session Equipment Utilized During Treatment: Oxygen Activity Tolerance: Treatment limited secondary to medical complications (Comment) Patient left: in bed;with nursing/sitter in room;with restraints reapplied(mitts bil) Nurse Communication: (RN present throughout) PT Visit Diagnosis: Other abnormalities of gait and mobility (R26.89);Muscle weakness (generalized) (M62.81)     Time: [id number] PT Time Calculation (min) (ACUTE ONLY): 13 min  Charges:  $Therapeutic Exercise: 8-22 mins                        [employer] Sciences CorporationLynn P Janine Reller, PT 11/14/2018, 5:32 PM

## 2018-11-14 NOTE — Progress Notes (Signed)
PROGRESS NOTE  Shannon BradfordDeborah Kendzierski  IHK:[id number] DOB: 01/24/1966 DOA: 10/22/2018 PCP: No primary care provider on file.   Brief Narrative: Shannon Meyer is a 53 y.o. female with no significant medical history who presented with cough, weakness and shortness of breath found to have covid-19 with bilateral CXR opacities, intubated in ED and [facility]   Patient's hospital course included failed extubation Lasix/8 and placement of tracheostomy on 6/11.  She has completed course of Remdisivir, Zithromax and received Actemra on 5/20 & 5/21.   Patient at times gets quite agitated violent.  Started dropping episodes of hypotension and sinus tachycardia.  Trying fluid boluses and IV low-dose metoprolol  Assessment & Plan: Principal Problem:   COVID-19 Active Problems:   Leukocytosis   Sepsis (HCC)   Elevated LFTs   QT prolongation   Acute respiratory failure (HCC)   Pressure injury of skin   Tracheostomy status (HCC)  Acute hypoxic respiratory failure due to covid-19 pneumonia: Failed extubation 6/8. - Complete remdesivir 5/21 - 5/30, received actemra 5/20, and 5/21. Received CTX 5/20-5/26, azithromycin 5/20-5/24. - Continue airborne, contact precautions. PPE including surgical gown, gloves, face shield, cap, shoe covers, and N-95 used during this encounter in a negative pressure room.  - Maintain euvolemia/net negative.  - Avoid NSAIDs  -Status post tracheostomy.  Continue ventilator support, management by critical care.  Agitation: May be secondary to steroids versus ICU psychosis.  Hypernatremia:  - Continue free water  Hypokalemia:  - Supplement and monitor  Positive blood cultures: 1 of 4 bottles, suspect CoNS contaminant.   Thrombocytopenia: Mild. Plt stable. - Ok to continue lovenox 40mg  q12h per covid ICU protocol.  Obesity: BMI 34.  - Optimize nutritional status  DVT prophylaxis: Lovenox Code Status: Full Family Communication: Attempted to call daughter, left  message.  Disposition Plan: Remain in ICU, work on decreasing ventilator support  Consultants:   PCCM  Procedures:   5/20 ETT  5/24 R PICC line  6/11 Tracheostomy  Antimicrobials:  Azithromycin 5/20 >> 5/24  Rocephin 5/20 >> 5/26  Remdesivir 5/25 >> 5/30  Actemra 5/20 and 5/21  Diflucan 5/30 >> 6/1    Objective: Vitals:   11/14/18 1000 11/14/18 1100 11/14/18 1200 11/14/18 1400  BP: 103/79 92/71 (!) 78/58 (!) 84/69  Pulse: 73 (!) 120 99 (!) 112  Resp: (!) 0 (!) 30 (!) 32 (!) 0  Temp:   98.6 F (37 C)   TempSrc:   Axillary   SpO2: 96% 90% 94% 95%  Weight:      Height:        Intake/Output Summary (Last 24 hours) at 11/14/2018 1546 Last data filed at 11/14/2018 1400 Gross per 24 hour  Intake 1043.07 ml  Output 1850 ml  Net -806.93 ml   Filed Weights   11/12/18 0348 11/13/18 0448 11/14/18 0500  Weight: 90.6 kg 91.5 kg 92.9 kg   Gen: 53 y.o. female alert, follows commands HEENT: Status post tracheostomy with ventilator support Pulm: Clear to auscultation bilaterally CV: Borderline tachycardia, regular rate GI: Abdomen soft, non-tender, non-distended, with normoactive bowel sounds.  Ext: Warm, no deformities Skin: No rashes, lesions or ulcers on visualized skin. Neuro: Rousable, follows some commands Psych: At times, agitated  Data Reviewed: I have personally reviewed following labs and imaging studies  CBC: Recent Labs  Lab 11/08/18 0430 11/10/18 1722 11/11/18 0355 11/12/18 0403 11/13/18 0330 11/14/18 0220  WBC 8.1  --  10.1 9.4 6.6 9.0  NEUTROABS 4.5  --   --   --   --   --  HGB 10.9* 13.6 11.4* 10.7* 10.9* 11.3*  HCT 37.2 40.0 38.6 35.7* 36.1 36.1  MCV 94.4  --  94.8 94.7 95.3 94.5  PLT 134*  --  153 124* 125* 126*   Basic Metabolic Panel: Recent Labs  Lab 11/10/18 0200 11/10/18 1657 11/10/18 1722 11/11/18 0355 11/11/18 1640 11/12/18 0403 11/13/18 0330 11/14/18 0220  NA 143  --  142 142  --  141 142 140  K 3.6  --  3.8 3.3*  --   3.7 3.7 3.4*  CL 113*  --   --  111  --  111 109 108  CO2 24  --   --  24  --  25 25 24   GLUCOSE 112*  --   --  115*  --  97 112* 112*  BUN 34*  --   --  32*  --  30* 33* 27*  CREATININE 0.48  --   --  0.62  --  0.56 0.46 0.51  CALCIUM 8.7*  --   --  8.3*  --  8.0* 8.4* 8.2*  MG  --  1.9  --  2.2 2.2 2.0  --   --   PHOS  --  4.4  --  4.4 3.9 3.5  --   --    GFR: Estimated Creatinine Clearance: 87.8 mL/min (by C-G formula based on SCr of 0.51 mg/dL). Liver Function Tests: Recent Labs  Lab 11/08/18 0430  AST 26  ALT 28  ALKPHOS 86  BILITOT 0.7  PROT 5.2*  ALBUMIN 2.6*   No results for input(s): LIPASE, AMYLASE in the last 168 hours. No results for input(s): AMMONIA in the last 168 hours. Coagulation Profile: Recent Labs  Lab 11/13/18 0854  INR 0.9   Cardiac Enzymes: No results for input(s): CKTOTAL, CKMB, CKMBINDEX, TROPONINI in the last 168 hours. BNP (last 3 results) No results for input(s): PROBNP in the last 8760 hours. HbA1C: No results for input(s): HGBA1C in the last 72 hours. CBG: Recent Labs  Lab 11/13/18 1634 11/13/18 1959 11/14/18 0032 11/14/18 0749 11/14/18 1155  GLUCAP 112* 102* 107* 106* 103*   Lipid Profile: No results for input(s): CHOL, HDL, LDLCALC, TRIG, CHOLHDL, LDLDIRECT in the last 72 hours. Thyroid Function Tests: No results for input(s): TSH, T4TOTAL, FREET4, T3FREE, THYROIDAB in the last 72 hours. Anemia Panel: No results for input(s): VITAMINB12, FOLATE, FERRITIN, TIBC, IRON, RETICCTPCT in the last 72 hours. Urine analysis:    Component Value Date/Time   COLORURINE STRAW (A) 11/01/2018 0901   APPEARANCEUR HAZY (A) 11/01/2018 0901   LABSPEC 1.010 11/01/2018 0901   PHURINE 5.0 11/01/2018 0901   GLUCOSEU NEGATIVE 11/01/2018 0901   HGBUR NEGATIVE 11/01/2018 0901   BILIRUBINUR NEGATIVE 11/01/2018 0901   KETONESUR NEGATIVE 11/01/2018 0901   PROTEINUR NEGATIVE 11/01/2018 0901   NITRITE NEGATIVE 11/01/2018 0901   LEUKOCYTESUR NEGATIVE  11/01/2018 0901   No results found for this or any previous visit (from the past 240 hour(s)).    Radiology Studies: Dg Chest Port 1 View  Result Date: 11/13/2018 CLINICAL DATA:  Tracheostomy placement.  COVID-19. EXAM: PORTABLE CHEST 1 VIEW COMPARISON:  11/11/2018. FINDINGS: Tracheostomy is been inserted. Tip lies 4.5 cm above carina. BILATERAL pulmonary opacities persist, LEFT greater than RIGHT, not significantly improved. Cardiomegaly. Feeding tube duodenum. IMPRESSION: Satisfactory tracheostomy placement. No significant improvement aeration. Electronically Signed   By: Elsie StainJohn T Curnes M.D.   On: 11/13/2018 16:16    Scheduled Meds: . chlorhexidine  15 mL Mouth/Throat BID  .  Chlorhexidine Gluconate Cloth  6 each Topical Daily  . docusate  100 mg Per Tube BID  . enoxaparin (LOVENOX) injection  40 mg Subcutaneous Q12H  . famotidine  20 mg Per Tube Q12H  . feeding supplement (PRO-STAT SUGAR FREE 64)  60 mL Per Tube Daily  . feeding supplement (VITAL HIGH PROTEIN)  1,000 mL Per Tube Q24H  . furosemide  40 mg Oral Daily  . insulin aspart  0-9 Units Subcutaneous Q4H  . mouth rinse  15 mL Mouth Rinse 10 times per day  . midazolam  2 mg Intravenous Once  . multivitamin  15 mL Per Tube Daily  . oxyCODONE  5 mg Per Tube TID  . potassium chloride  40 mEq Oral Daily  . QUEtiapine  100 mg Oral BID  . sodium chloride flush  10-40 mL Intracatheter Q12H  . sodium chloride flush  3 mL Intravenous Q12H  . vecuronium  10 mg Intravenous Once   Continuous Infusions: . sodium chloride Stopped (11/13/18 1337)  . dexmedetomidine (PRECEDEX) IV infusion 0.7 mcg/kg/hr (11/14/18 1228)  . norepinephrine (LEVOPHED) Adult infusion Stopped (11/13/18 0100)     LOS: 23 days   Time spent: Spent 40 minutes in the care of this critically ill patient including medical decision making, bedside examination and review patient's labs, records and radiology studies plus discussion of care with nursing and critical care.    Annita Brod, MD [facility] [email or web address] Password [facility] 11/14/2018, 3:46 PM

## 2018-11-14 NOTE — Progress Notes (Signed)
Occupational Therapy Treatment Patient Details Name: Tanyla Stege MRN: [id number] DOB: 06-15-1965 Today's Date: 11/14/2018    History of present illness Pt adm 5/20 with hypoxic resp failure due to Covid 19. Pt intubated 5/20 and extubated 6/8. PMH - none. Trach 6/11.    OT comments  Pt with low BP therefore session completed at bed level for UB ROM/therapeutic exercise as tolerated. FiO2 100%; Peep 5. Pt assisting with BUE exercise. Appears restless, reaching toward trach/neck. Will further assess grip strength to work on using dry erase board for communication. HR increased to 138 with increased RR toward end of session. Will progress mobility and ADL as pt is able to tolerate.   Follow Up Recommendations  CIR    Equipment Recommendations  Other (comment)(TBA)    Recommendations for Other Services      Precautions / Restrictions Precautions Precautions: Fall Precaution Comments: trach       Mobility Bed Mobility Overal bed mobility: Needs Assistance             General bed mobility comments: Repositioned pt in bed with total A +2`  Transfers                 General transfer comment: not attempted    Balance                                           ADL either performed or assessed with clinical judgement   ADL Overall ADL's : Needs assistance/impaired                                             Vision       Perception     Praxis      Cognition Arousal/Alertness: Lethargic Behavior During Therapy: Restless Overall Cognitive Status: Difficult to assess                     Current Attention Level: Sustained   Following Commands: Follows one step commands inconsistently   Awareness: Intellectual Problem Solving: Slow processing;Decreased initiation          Exercises Exercises: General Upper Extremity General Exercises - Upper Extremity Shoulder Flexion: AAROM;Both;10 reps;Supine Shoulder  ABduction: AAROM;Both;10 reps;Supine Elbow Flexion: AAROM;Both;AROM;10 reps;Supine Elbow Extension: AROM;AAROM;Both;10 reps;Supine Digit Composite Flexion: AROM;AAROM;Both;15 reps;Supine Composite Extension: AROM;AAROM;Both;15 reps;Supine   Shoulder Instructions       General Comments  Pt's lethargy and ability to follow commands may also be affected by Precedex.     Pertinent Vitals/ Pain       Pain Assessment: Faces Faces Pain Scale: Hurts little more Pain Location: generalized Pain Descriptors / Indicators: Discomfort;Grimacing Pain Intervention(s): Limited activity within patient's tolerance  Home Living                                          Prior Functioning/Environment              Frequency  Min 3X/week        Progress Toward Goals  OT Goals(current goals can now be found in the care plan section)  Progress towards OT goals: Not progressing toward goals - comment(trach; HR; BP)  Acute Rehab OT Goals Patient Stated Goal: none stated OT Goal Formulation: Patient unable to participate in goal setting Time For Goal Achievement: 11/24/18 Potential to Achieve Goals: Good ADL Goals Pt Will Perform Grooming: with min assist;sitting Pt Will Transfer to Toilet: stand pivot transfer;bedside commode;with mod assist Pt Will Perform Toileting - Clothing Manipulation and hygiene: with mod assist;sit to/from stand;sitting/lateral leans Additional ADL Goal #1: Pt will perform bed mobility with Min A in preparation for ADLs Additional ADL Goal #2: Pt will tolerate sitting at EOB for 10 minutes with Min Guard A in preparation for ADLs Additional ADL Goal #3: Pt will demonstrate selective attention during ADL in a distracting environement with Min cues  Plan Discharge plan remains appropriate    Co-evaluation    PT/OT/SLP Co-Evaluation/Treatment: Yes(partial session)            AM-PAC OT "6 Clicks" Daily Activity     Outcome Measure   Help  from another person eating meals?: Total Help from another person taking care of personal grooming?: Total Help from another person toileting, which includes using toliet, bedpan, or urinal?: Total Help from another person bathing (including washing, rinsing, drying)?: Total Help from another person to put on and taking off regular upper body clothing?: Total Help from another person to put on and taking off regular lower body clothing?: Total 6 Click Score: 6    End of Session    OT Visit Diagnosis: Unsteadiness on feet (R26.81);Other abnormalities of gait and mobility (R26.89);Muscle weakness (generalized) (M62.81);Pain;Other symptoms and signs involving cognitive function Pain - part of body: (generalized)   Activity Tolerance Treatment limited secondary to medical complications (Comment)(low BP; Tachy)   Patient Left in bed;with call bell/phone within reach;with restraints reapplied;with nursing/sitter in room   Nurse Communication Mobility status        Time: 1605(1605)-1620 OT Time Calculation (min): 15 min  Charges: OT General Charges $OT Visit: 1 Visit OT Treatments $Therapeutic Activity: 8-22 mins  Luisa DagoHilary Nafisa Olds, OT/L   Acute OT Clinical Specialist Acute Rehabilitation Services Pager [phone] Office [phone]    [facility] LLC[facility],HILLARY 11/14/2018, 5:04 PM

## 2018-11-14 NOTE — Progress Notes (Signed)
RB spoke with daughter to update her on her mother's condition. The family expressed happiness with her condition and requested we play music for her mother.

## 2018-11-15 DIAGNOSIS — E876 Hypokalemia: Secondary | ICD-10-CM

## 2018-11-15 LAB — BASIC METABOLIC PANEL
Anion gap: 8 (ref 5–15)
BUN: 24 mg/dL — ABNORMAL HIGH (ref 6–20)
CO2: 26 mmol/L (ref 22–32)
Calcium: 8.5 mg/dL — ABNORMAL LOW (ref 8.9–10.3)
Chloride: 108 mmol/L (ref 98–111)
Creatinine, Ser: 0.47 mg/dL (ref 0.44–1.00)
GFR calc Af Amer: >60 mL/min (ref >60)
GFR calc non Af Amer: >60 mL/min (ref >60)
Glucose, Bld: 136 mg/dL — ABNORMAL HIGH (ref 70–99)
Potassium: 3.4 mmol/L — ABNORMAL LOW (ref 3.5–5.1)
Sodium: 142 mmol/L (ref 135–145)

## 2018-11-15 LAB — CBC
HCT: 33.9 % — ABNORMAL LOW (ref 36.0–46.0)
Hemoglobin: 10.2 g/dL — ABNORMAL LOW (ref 12.0–15.0)
MCH: 28.5 pg (ref 26.0–34.0)
MCHC: 30.1 g/dL (ref 30.0–36.0)
MCV: 94.7 fL (ref 80.0–100.0)
Platelets: 127 10*3/uL — ABNORMAL LOW (ref 150–400)
RBC: 3.58 MIL/uL — ABNORMAL LOW (ref 3.87–5.11)
RDW: 17.8 % — ABNORMAL HIGH (ref 11.5–15.5)
WBC: 8.7 10*3/uL (ref 4.0–10.5)
nRBC: 0 % (ref 0.0–0.2)

## 2018-11-15 LAB — GLUCOSE, CAPILLARY
Glucose-Capillary: 103 mg/dL — ABNORMAL HIGH (ref 70–99)
Glucose-Capillary: 109 mg/dL — ABNORMAL HIGH (ref 70–99)
Glucose-Capillary: 98 mg/dL (ref 70–99)
Glucose-Capillary: 97 mg/dL (ref 70–99)
Glucose-Capillary: 99 mg/dL (ref 70–99)

## 2018-11-15 MED ORDER — METOPROLOL TARTRATE 5 MG/5ML IV SOLN
5.0000 mg | Freq: Four times a day (QID) | INTRAVENOUS | Status: DC | PRN
  Administered 2018-11-15 – 2018-12-01 (×13): 5 mg via INTRAVENOUS
  Filled 2018-11-15 (×13): qty 5

## 2018-11-15 MED ORDER — OXYCODONE HCL 5 MG PO TABS
5.0000 mg | ORAL_TABLET | Freq: Three times a day (TID) | ORAL | Status: DC
  Administered 2018-11-15 – 2018-11-16 (×4): 5 mg
  Filled 2018-11-15 (×3): qty 1

## 2018-11-15 NOTE — Progress Notes (Signed)
Pt daughter called for update. Answered all of her questions as well as discussed the purpose of sedation and weaning on the ventilator with Trach. Pt daughter had no further questions.

## 2018-11-15 NOTE — Progress Notes (Signed)
Pt sister called from [place] and asked for update. Answered her questions until she had no more.

## 2018-11-15 NOTE — Progress Notes (Signed)
Pt daughter just called, informed her that pt has been successful on trach collar all day and has actually done better than she was on the vent. Daughter has no further questions at this time.

## 2018-11-15 NOTE — Progress Notes (Signed)
Updated Shannon Meyer on patient condition and medications via phone.

## 2018-11-15 NOTE — Progress Notes (Signed)
PROGRESS NOTE  Shannon BradfordDeborah Meyer  XBJ:[id number] DOB: 03/31/1966 DOA: 10/22/2018 PCP: No primary care provider on file.   Brief Narrative: Shannon Meyer is a 53 y.o. female with no significant medical history who presented with cough, weakness and shortness of breath found to have covid-19 with bilateral CXR opacities, intubated in ED and [facility]   Patient's hospital course included failed extubation Lasix/8 and placement of tracheostomy on 6/11.  She has completed course of Remdisivir, Zithromax and received Actemra on 5/20 & 5/21.   Patient at times gets quite agitated violent.  Since she has been more awake, have episodes of hypotension and severe sinus tachycardia, responding well to IV metoprolol  Patient did well overnight.  Tolerating attempts to wean oxygen support.  Still at times agitated confused.  Assessment & Plan: Principal Problem:   COVID-19 Active Problems:   Leukocytosis   Sepsis (HCC)   Elevated LFTs   QT prolongation   Acute respiratory failure (HCC)   Pressure injury of skin   Tracheostomy status (HCC)  Acute hypoxic respiratory failure due to covid-19 pneumonia: Failed extubation 6/8. - Complete remdesivir 5/21 - 5/30, received actemra 5/20, and 5/21. Received CTX 5/20-5/26, azithromycin 5/20-5/24. - Continue airborne, contact precautions. PPE including surgical gown, gloves, face shield, cap, shoe covers, and N-95 used during this encounter in a negative pressure room.  - Maintain euvolemia/net negative.  - Avoid NSAIDs  -Status post tracheostomy.  Working on Consulting civil engineerweaning ventilator.  Stable for the next 1 to 2 days, transfer to floor.  Agitation: May be secondary to steroids versus ICU psychosis.  Hypernatremia:  - Continue free water, sodium normalized  Hypokalemia:  - Supplement and monitor  Positive blood cultures: 1 of 4 bottles, likely coNS contaminant.   Thrombocytopenia: Mild. Plt stable. - Ok to continue lovenox 40mg  q12h per covid ICU  protocol.  Obesity: BMI 34.  - Optimize nutritional status  DVT prophylaxis: Lovenox Code Status: Full Family Communication: Updated daughter Disposition Plan: Remain in ICU, work on decreasing ventilator support  Consultants:   PCCM  Procedures:   5/20 ETT  5/24 R PICC line  6/11 Tracheostomy  Antimicrobials:  Azithromycin 5/20 >> 5/24  Rocephin 5/20 >> 5/26  Remdesivir 5/25 >> 5/30  Actemra 5/20 and 5/21  Diflucan 5/30 >> 6/1    Objective: Vitals:   11/15/18 0900 11/15/18 1000 11/15/18 1100 11/15/18 1200  BP: (!) 141/88 118/80 125/78 (!) 144/84  Pulse: (!) 102 (!) 150 (!) 118 (!) 127  Resp: (!) 37 (!) 39 (!) 41 (!) 32  Temp:    99 F (37.2 C)  TempSrc:    Axillary  SpO2: 97% (!) 83% 99% 99%  Weight:      Height:        Intake/Output Summary (Last 24 hours) at 11/15/2018 1417 Last data filed at 11/15/2018 1200 Gross per 24 hour  Intake 2535 ml  Output 2025 ml  Net 510 ml   Filed Weights   11/13/18 0448 11/14/18 0500 11/15/18 0500  Weight: 91.5 kg 92.9 kg 92 kg   Gen: 53 y.o. female awakens easily, follows commands HEENT: Status post tracheostomy with ventilator support Pulm: Clear to auscultation bilaterally CV: Borderline tachycardia, regular rate GI: Abdomen soft, non-tender, non-distended, with normoactive bowel sounds.  Ext: Warm, no deformities Skin: No rashes, lesions or ulcers on visualized skin. Neuro: Rousable, follows most commands Psych: At times, agitated  Data Reviewed: I have personally reviewed following labs and imaging studies  CBC: Recent Labs  Lab 11/11/18 0355 11/12/18 0403 11/13/18 0330 11/14/18 0220 11/15/18 0142  WBC 10.1 9.4 6.6 9.0 8.7  HGB 11.4* 10.7* 10.9* 11.3* 10.2*  HCT 38.6 35.7* 36.1 36.1 33.9*  MCV 94.8 94.7 95.3 94.5 94.7  PLT 153 124* 125* 126* 956*   Basic Metabolic Panel: Recent Labs  Lab 11/10/18 1657  11/11/18 0355 11/11/18 1640 11/12/18 0403 11/13/18 0330 11/14/18 0220 11/15/18 0142   NA  --    < > 142  --  141 142 140 142  K  --    < > 3.3*  --  3.7 3.7 3.4* 3.4*  CL  --   --  111  --  111 109 108 108  CO2  --   --  24  --  25 25 24 26   GLUCOSE  --   --  115*  --  97 112* 112* 136*  BUN  --   --  32*  --  30* 33* 27* 24*  CREATININE  --   --  0.62  --  0.56 0.46 0.51 0.47  CALCIUM  --   --  8.3*  --  8.0* 8.4* 8.2* 8.5*  MG 1.9  --  2.2 2.2 2.0  --   --   --   PHOS 4.4  --  4.4 3.9 3.5  --   --   --    < > = values in this interval not displayed.   GFR: Estimated Creatinine Clearance: 87.3 mL/min (by C-G formula based on SCr of 0.47 mg/dL). Liver Function Tests: No results for input(s): AST, ALT, ALKPHOS, BILITOT, PROT, ALBUMIN in the last 168 hours. No results for input(s): LIPASE, AMYLASE in the last 168 hours. No results for input(s): AMMONIA in the last 168 hours. Coagulation Profile: Recent Labs  Lab 11/13/18 0854  INR 0.9   Cardiac Enzymes: No results for input(s): CKTOTAL, CKMB, CKMBINDEX, TROPONINI in the last 168 hours. BNP (last 3 results) No results for input(s): PROBNP in the last 8760 hours. HbA1C: No results for input(s): HGBA1C in the last 72 hours. CBG: Recent Labs  Lab 11/14/18 1949 11/14/18 2345 11/15/18 0400 11/15/18 0737 11/15/18 1235  GLUCAP 92 105* 103* 109* 98   Lipid Profile: No results for input(s): CHOL, HDL, LDLCALC, TRIG, CHOLHDL, LDLDIRECT in the last 72 hours. Thyroid Function Tests: No results for input(s): TSH, T4TOTAL, FREET4, T3FREE, THYROIDAB in the last 72 hours. Anemia Panel: No results for input(s): VITAMINB12, FOLATE, FERRITIN, TIBC, IRON, RETICCTPCT in the last 72 hours. Urine analysis:    Component Value Date/Time   COLORURINE STRAW (A) 11/01/2018 0901   APPEARANCEUR HAZY (A) 11/01/2018 0901   LABSPEC 1.010 11/01/2018 0901   PHURINE 5.0 11/01/2018 0901   GLUCOSEU NEGATIVE 11/01/2018 0901   HGBUR NEGATIVE 11/01/2018 0901   BILIRUBINUR NEGATIVE 11/01/2018 0901   KETONESUR NEGATIVE 11/01/2018 0901    PROTEINUR NEGATIVE 11/01/2018 0901   NITRITE NEGATIVE 11/01/2018 0901   LEUKOCYTESUR NEGATIVE 11/01/2018 0901   No results found for this or any previous visit (from the past 240 hour(s)).    Radiology Studies: Dg Chest Port 1 View  Result Date: 11/13/2018 CLINICAL DATA:  Tracheostomy placement.  COVID-19. EXAM: PORTABLE CHEST 1 VIEW COMPARISON:  11/11/2018. FINDINGS: Tracheostomy is been inserted. Tip lies 4.5 cm above carina. BILATERAL pulmonary opacities persist, LEFT greater than RIGHT, not significantly improved. Cardiomegaly. Feeding tube duodenum. IMPRESSION: Satisfactory tracheostomy placement. No significant improvement aeration. Electronically Signed   By: Staci Righter M.D.   On:  11/13/2018 16:16    Scheduled Meds: . chlorhexidine  15 mL Mouth/Throat BID  . Chlorhexidine Gluconate Cloth  6 each Topical Daily  . docusate  100 mg Per Tube BID  . enoxaparin (LOVENOX) injection  40 mg Subcutaneous Q12H  . famotidine  20 mg Per Tube Q12H  . feeding supplement (PRO-STAT SUGAR FREE 64)  60 mL Per Tube Daily  . feeding supplement (VITAL HIGH PROTEIN)  1,000 mL Per Tube Q24H  . furosemide  40 mg Oral Daily  . insulin aspart  0-9 Units Subcutaneous Q4H  . mouth rinse  15 mL Mouth Rinse 10 times per day  . midazolam  2 mg Intravenous Once  . multivitamin  15 mL Per Tube Daily  . oxyCODONE  5 mg Per Tube Q8H  . potassium chloride  40 mEq Oral Daily  . QUEtiapine  100 mg Oral BID  . sodium chloride flush  10-40 mL Intracatheter Q12H  . sodium chloride flush  3 mL Intravenous Q12H  . vecuronium  10 mg Intravenous Once   Continuous Infusions: . sodium chloride 10 mL/hr at 11/15/18 1200  . dexmedetomidine (PRECEDEX) IV infusion 0.6 mcg/kg/hr (11/15/18 1200)  . norepinephrine (LEVOPHED) Adult infusion Stopped (11/13/18 0100)     LOS: 24 days   Time spent: Spent 35 minutes in the care of this critically ill patient including medical decision making, bedside examination and review  patient's labs, records and radiology studies plus discussion of care with nursing and critical care.   Hollice EspySendil K Rodriquez Thorner, MD [facility] [email or web address] Password [email or web address] 11/15/2018, 2:17 PM

## 2018-11-15 NOTE — Progress Notes (Signed)
NAME:  Shannon Meyer, MRN:  [id number], DOB:  [birth year], LOS: 24 ADMISSION DATE:  10/22/2018, CONSULTATION DATE: Oct 22, 2018 REFERRING MD:  Sedonia Small, CHIEF COMPLAINT: Dyspnea  Brief History   53 year old female with no past medical history admitted for med [facility] on Oct 22, 2018 with acute respiratory failure with hypoxemia due to COVID-19 pneumonia.  Past Medical History  No known past medical history  Significant Hospital Events   5/20 Intubated in ED and admitted to [facility] ICU 5/21-5/26 Weaning FIO2 and PEEP, diuresing 5/26-6/3 Tolerating weaning for 1-2 hours 6/7 weaning on pressure support ventilation again this morning 10/5 6/8 extubated, reintubated (tachypnea, tachycardia) 6/12 Trach  Consults:  Pulmonary and critical care medicine  Procedures:  5/20 ET tube> 6/11  Trach 6/11 >> 5/24 PICC line right arm>   Significant Diagnostic Tests:  Oct 28, 2018 CT angiogram chest no pulmonary embolism, bilateral atypical pneumonia consistent with COVID-19  Micro Data:  SARS-CoV-2 May 20+ Blood culture May 20 1 out of 2 GPC staph, coag negative  Antimicrobials:  Zithromax 5/20 >>5/24 Rocephin 5/20 >>5/26 Remdesivir 5/21 >5/30 Actemra 5/20>5/21 Solumedrol 5/12  > 5/26  Interim history/subjective:  Has periods of agitation  Objective   Blood pressure 98/61, pulse (!) 52, temperature (!) 96.7 F (35.9 C), temperature source Axillary, resp. rate (!) 5, height 5\' 4"  (1.626 m), weight 92 kg, SpO2 99 %.    Vent Mode: PCV FiO2 (%):  [40 %] 40 % Set Rate:  [20 bmp] 20 bmp PEEP:  [5 cmH20] 5 cmH20 Plateau Pressure:  [28 cmH20-30 cmH20] 30 cmH20   Intake/Output Summary (Last 24 hours) at 11/15/2018 0758 Last data filed at 11/15/2018 0600 Gross per 24 hour  Intake 1892.15 ml  Output 1900 ml  Net -7.85 ml   Filed Weights   11/13/18 0448 11/14/18 0500 11/15/18 0500  Weight: 91.5 kg 92.9 kg 92 kg    Examination: Gen:      No acute  distress HEENT:  EOMI, sclera anicteric Neck:     No masses; no thyromegaly, trach  Lungs:    Clear to auscultation bilaterally; normal respiratory effort CV:         Regular rate and rhythm; no murmurs Abd:      + bowel sounds; soft, non-tender; no palpable masses, no distension Ext:    No edema; adequate peripheral perfusion Skin:      Warm and dry; no rash Neuro: Awake, responsive  Resolved Hospital Problem list     Assessment & Plan:  Acute respiratory failure with hypoxemia due to COVID-19 pneumonia Very low lung compliance. 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 Speech  Delirium, encephalopathy Precedex, ativan PRN Started seroqel. Follow qtc  Best practice:  Diet: tube feeding via cortrak Pain/Anxiety/Delirium protocol (if indicated): RASS goal 0 VAP protocol (if indicated): yes DVT prophylaxis: lovenox bid GI prophylaxis: famotidine Glucose control: SSI Mobility: PT consult, range of motion exercises Code Status: full Family Communication: Pending Disposition: remain in ICU   Labs   CBC: Recent Labs  Lab 11/11/18 0355 11/12/18 0403 11/13/18 0330 11/14/18 0220 11/15/18 0142  WBC 10.1 9.4 6.6 9.0 8.7  HGB 11.4* 10.7* 10.9* 11.3* 10.2*  HCT 38.6 35.7* 36.1 36.1 33.9*  MCV 94.8 94.7 95.3 94.5 94.7  PLT 153 124* 125* 126* 127*    Basic Metabolic Panel: Recent Labs  Lab 11/10/18 1657  11/11/18 0355 11/11/18 1640 11/12/18 0403 11/13/18 0330 11/14/18 0220 11/15/18  0142  NA  --    < > 142  --  141 142 140 142  K  --    < > 3.3*  --  3.7 3.7 3.4* 3.4*  CL  --   --  111  --  111 109 108 108  CO2  --   --  24  --  25 25 24 26   GLUCOSE  --   --  115*  --  97 112* 112* 136*  BUN  --   --  32*  --  30* 33* 27* 24*  CREATININE  --   --  0.62  --  0.56 0.46 0.51 0.47  CALCIUM  --   --  8.3*  --  8.0* 8.4* 8.2* 8.5*  MG 1.9  --  2.2 2.2 2.0  --   --   --   PHOS 4.4  --  4.4 3.9 3.5  --   --   --    < > = values  in this interval not displayed.   GFR: Estimated Creatinine Clearance: 87.3 mL/min (by C-G formula based on SCr of 0.47 mg/dL). Recent Labs  Lab 11/12/18 0403 11/13/18 0330 11/14/18 0220 11/15/18 0142  WBC 9.4 6.6 9.0 8.7    Liver Function Tests: No results for input(s): AST, ALT, ALKPHOS, BILITOT, PROT, ALBUMIN in the last 168 hours. No results for input(s): LIPASE, AMYLASE in the last 168 hours. No results for input(s): AMMONIA in the last 168 hours.  ABG    Component Value Date/Time   PHART 7.432 11/10/2018 1722   PCO2ART 36.0 11/10/2018 1722   PO2ART 77.0 (L) 11/10/2018 1722   HCO3 23.9 11/10/2018 1722   TCO2 25 11/10/2018 1722   ACIDBASEDEF 1.0 10/24/2018 0419   O2SAT 95.0 11/10/2018 1722     Coagulation Profile: Recent Labs  Lab 11/13/18 0854  INR 0.9    Cardiac Enzymes: No results for input(s): CKTOTAL, CKMB, CKMBINDEX, TROPONINI in the last 168 hours.  HbA1C: No results found for: HGBA1C  CBG: Recent Labs  Lab 11/14/18 1155 11/14/18 1949 11/14/18 2345 11/15/18 0400 11/15/18 0737  GLUCAP 103* 92 105* 103* 109*   The patient is critically ill with multiple organ system failure and requires high complexity decision making for assessment and support, frequent evaluation and titration of therapies, advanced monitoring, review of radiographic studies and interpretation of complex data.   Critical Care Time devoted to patient care services, exclusive of separately billable procedures, described in this note is 35 minutes.   Chilton GreathousePraveen Everet Flagg MD [facility] Pager [phone] If no answer call [phone] 11/15/2018, 8:08 AM

## 2018-11-16 LAB — GLUCOSE, CAPILLARY
Glucose-Capillary: 100 mg/dL — ABNORMAL HIGH (ref 70–99)
Glucose-Capillary: 104 mg/dL — ABNORMAL HIGH (ref 70–99)
Glucose-Capillary: 112 mg/dL — ABNORMAL HIGH (ref 70–99)
Glucose-Capillary: 118 mg/dL — ABNORMAL HIGH (ref 70–99)
Glucose-Capillary: 91 mg/dL (ref 70–99)
Glucose-Capillary: 91 mg/dL (ref 70–99)

## 2018-11-16 LAB — CBC
HCT: 40.3 % (ref 36.0–46.0)
Hemoglobin: 12 g/dL (ref 12.0–15.0)
MCH: 28.5 pg (ref 26.0–34.0)
MCHC: 29.8 g/dL — ABNORMAL LOW (ref 30.0–36.0)
MCV: 95.7 fL (ref 80.0–100.0)
Platelets: 180 10*3/uL (ref 150–400)
RBC: 4.21 MIL/uL (ref 3.87–5.11)
RDW: 18.1 % — ABNORMAL HIGH (ref 11.5–15.5)
WBC: 14.7 10*3/uL — ABNORMAL HIGH (ref 4.0–10.5)
nRBC: 0 % (ref 0.0–0.2)

## 2018-11-16 LAB — BASIC METABOLIC PANEL
Anion gap: 8 (ref 5–15)
BUN: 18 mg/dL (ref 6–20)
CO2: 28 mmol/L (ref 22–32)
Calcium: 8.9 mg/dL (ref 8.9–10.3)
Chloride: 107 mmol/L (ref 98–111)
Creatinine, Ser: 0.46 mg/dL (ref 0.44–1.00)
GFR calc Af Amer: >60 mL/min (ref >60)
GFR calc non Af Amer: >60 mL/min (ref >60)
Glucose, Bld: 122 mg/dL — ABNORMAL HIGH (ref 70–99)
Potassium: 3.6 mmol/L (ref 3.5–5.1)
Sodium: 143 mmol/L (ref 135–145)

## 2018-11-16 MED ORDER — POTASSIUM CHLORIDE 20 MEQ/15ML (10%) PO SOLN
40.0000 meq | Freq: Two times a day (BID) | ORAL | Status: DC
  Administered 2018-11-16 – 2018-11-17 (×3): 40 meq via ORAL
  Filled 2018-11-16 (×3): qty 30

## 2018-11-16 MED ORDER — MIDAZOLAM HCL 2 MG/2ML IJ SOLN
2.0000 mg | INTRAMUSCULAR | Status: DC | PRN
  Administered 2018-11-16 – 2018-11-20 (×11): 2 mg via INTRAVENOUS
  Filled 2018-11-16 (×11): qty 2

## 2018-11-16 MED ORDER — CLONAZEPAM 0.1 MG/ML ORAL SUSPENSION: 0.5000 mg | Freq: Two times a day (BID) | ORAL | Status: DC

## 2018-11-16 MED ORDER — "THROMBI-PAD 3""X3"" EX PADS"
1.0000 | MEDICATED_PAD | Freq: Once | CUTANEOUS | Status: AC
  Administered 2018-11-16: 1 via TOPICAL
  Filled 2018-11-16: qty 1

## 2018-11-16 MED ORDER — CLONAZEPAM 0.5 MG PO TBDP
0.5000 mg | ORAL_TABLET | Freq: Two times a day (BID) | ORAL | Status: DC
  Administered 2018-11-16 – 2018-11-17 (×3): 0.5 mg via ORAL
  Filled 2018-11-16 (×3): qty 1

## 2018-11-16 MED ORDER — OXYCODONE HCL 5 MG PO TABS
2.5000 mg | ORAL_TABLET | Freq: Three times a day (TID) | ORAL | Status: DC
  Administered 2018-11-16 – 2018-11-17 (×3): 2.5 mg
  Filled 2018-11-16 (×3): qty 1

## 2018-11-16 MED ORDER — LORAZEPAM 2 MG/ML IJ SOLN
1.0000 mg | INTRAMUSCULAR | Status: DC | PRN
  Administered 2018-11-16 – 2018-11-17 (×3): 1 mg via INTRAVENOUS
  Filled 2018-11-16 (×3): qty 1

## 2018-11-16 NOTE — Progress Notes (Signed)
Pt daughter called for update. Updated her on the pulmonary and vent status. She was happy to hear the success on the trach collar. No more questions at this time.

## 2018-11-16 NOTE — Progress Notes (Signed)
Called and updated daughter Elveta Rape on paitent's condition and medications. Daughter asked to facetime and we were able to do a brief facetime with patient.

## 2018-11-16 NOTE — Progress Notes (Signed)
Changed patients inner cannula and patient became agitated with increased respiratory rate (49-60) with a decrease in Sp02 level as well. Nurse gave 1mg  of Ativan to help attempt to calm patient down. Patient remained labored with respiratory rate still elevated. Placed patient back on ventilator full support, SpO2 increased and patients respiratory rate came down to 30bpm. Patient appears comfortable on the vent at this time, will leave patient on vent and attempt to wean later this morning.

## 2018-11-16 NOTE — Progress Notes (Signed)
Updated sister in [place] on patient condition via telephone.

## 2018-11-16 NOTE — Plan of Care (Signed)
  Problem: Clinical Measurements: Goal: Ability to maintain clinical measurements within normal limits will improve Outcome: Progressing   

## 2018-11-16 NOTE — Progress Notes (Signed)
NAME:  Shannon Meyer, MRN:  [id number], DOB:  03/26/1966, LOS: 25 ADMISSION DATE:  10/22/2018, CONSULTATION DATE: Oct 22, 2018 REFERRING MD:  Pilar PlateBero, CHIEF COMPLAINT: Dyspnea  Brief History   53 year old female with no past medical history [facility] Oct 22, 2018 with acute respiratory failure with hypoxemia due to COVID-19 pneumonia.  Past Medical History  No known past medical history  Significant Hospital Events   5/20 [facility]ubated in ED and [facility] 5/21-5/26 Weaning FIO2 and PEEP, diuresing 5/26-6/3 Tolerating weaning for 1-2 hours 6/7 weaning on pressure support ventilation again this morning 10/5 6/8 extubated, reintubated (tachypnea, tachycardia) 6/12 Trach 6/13 Trach collar all day  Consults:  Pulmonary and critical care medicine  Procedures:  5/20 ET tube> 6/11  Trach 6/11 >> 5/24 PICC line right arm>   Significant Diagnostic Tests:  Oct 28, 2018 CT angiogram chest no pulmonary embolism, bilateral atypical pneumonia consistent with COVID-19  Micro Data:  SARS-CoV-2 May 20+ Blood culture May 20 1 out of 2 GPC staph, coag negative  Antimicrobials:  Zithromax 5/20 >>5/24 Rocephin 5/20 >>5/26 Remdesivir 5/21 >5/30 Actemra 5/20>5/21 Solumedrol 5/12  > 5/26  Interim history/subjective:  Tolerated trach collar all day. Back on vent at night Episode of agitation today AM  Objective   Blood pressure (!) 141/104, pulse 96, temperature 97.8 F (36.6 C), temperature source Axillary, resp. rate (!) 36, height 5\' 4"  (1.626 m), weight 92.5 kg, SpO2 93 %.    Vent Mode: PCV FiO2 (%):  [40 %-60 %] 40 % Set Rate:  [20 bmp] 20 bmp PEEP:  [8 cmH20] 8 cmH20 Plateau Pressure:  [25 cmH20] 25 cmH20   Intake/Output Summary (Last 24 hours) at 11/16/2018 0856 Last data filed at 11/16/2018 0700 Gross per 24 hour  Intake 1723.65 ml  Output 2025 ml  Net -301.35 ml   Filed Weights   11/14/18 0500 11/15/18 0500  11/16/18 0500  Weight: 92.9 kg 92 kg 92.5 kg    Examination: Gen:      No acute distress HEENT:  EOMI, sclera anicteric, trach, mild oozing Neck:     No masses; no thyromegaly Lungs:    Clear to auscultation bilaterally; normal respiratory effort CV:         Regular rate and rhythm; no murmurs Abd:      + bowel sounds; soft, non-tender; no palpable masses, no distension Ext:    No edema; adequate peripheral perfusion Skin:      Warm and dry; no rash Neuro: Somnolent , arousable  Resolved Hospital Problem list     Assessment & Plan:  Acute respiratory failure with hypoxemia due to COVID-19 pneumonia Very low lung compliance. 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 Speech Will hold lovenox for oozing from trach site Thrombi pads  Delirium, encephalopathy Wean off precedex, ativan PRN Started seroqel. Follow qtc Start klonopin today  Best practice:  Diet: tube feeding via cortrak Pain/Anxiety/Delirium protocol (if indicated): RASS goal 0 VAP protocol (if indicated): yes DVT prophylaxis: lovenox bid GI prophylaxis: famotidine Glucose control: SSI Mobility: PT consult, range of motion exercises Code Status: full Family Communication: Per TRH Disposition: remain in ICU   Labs   CBC: Recent Labs  Lab 11/12/18 0403 11/13/18 0330 11/14/18 0220 11/15/18 0142 11/16/18 0330  WBC 9.4 6.6 9.0 8.7 14.7*  HGB 10.7* 10.9* 11.3* 10.2* 12.0  HCT 35.7* 36.1 36.1 33.9* 40.3  MCV 94.7 95.3  94.5 94.7 95.7  PLT 124* 125* 126* 127* 073    Basic Metabolic Panel: Recent Labs  Lab 11/10/18 1657  11/11/18 0355 11/11/18 1640 11/12/18 0403 11/13/18 0330 11/14/18 0220 11/15/18 0142 11/16/18 0330  NA  --    < > 142  --  141 142 140 142 143  K  --    < > 3.3*  --  3.7 3.7 3.4* 3.4* 3.6  CL  --   --  111  --  111 109 108 108 107  CO2  --   --  24  --  25 25 24 26 28   GLUCOSE  --   --  115*  --  97 112* 112* 136* 122*  BUN   --   --  32*  --  30* 33* 27* 24* 18  CREATININE  --   --  0.62  --  0.56 0.46 0.51 0.47 0.46  CALCIUM  --   --  8.3*  --  8.0* 8.4* 8.2* 8.5* 8.9  MG 1.9  --  2.2 2.2 2.0  --   --   --   --   PHOS 4.4  --  4.4 3.9 3.5  --   --   --   --    < > = values in this interval not displayed.   GFR: Estimated Creatinine Clearance: 87.6 mL/min (by C-G formula based on SCr of 0.46 mg/dL). Recent Labs  Lab 11/13/18 0330 11/14/18 0220 11/15/18 0142 11/16/18 0330  WBC 6.6 9.0 8.7 14.7*    Liver Function Tests: No results for input(s): AST, ALT, ALKPHOS, BILITOT, PROT, ALBUMIN in the last 168 hours. No results for input(s): LIPASE, AMYLASE in the last 168 hours. No results for input(s): AMMONIA in the last 168 hours.  ABG    Component Value Date/Time   PHART 7.432 11/10/2018 1722   PCO2ART 36.0 11/10/2018 1722   PO2ART 77.0 (L) 11/10/2018 1722   HCO3 23.9 11/10/2018 1722   TCO2 25 11/10/2018 1722   ACIDBASEDEF 1.0 10/24/2018 0419   O2SAT 95.0 11/10/2018 1722     Coagulation Profile: Recent Labs  Lab 11/13/18 0854  INR 0.9    Cardiac Enzymes: No results for input(s): CKTOTAL, CKMB, CKMBINDEX, TROPONINI in the last 168 hours.  HbA1C: No results found for: HGBA1C  CBG: Recent Labs  Lab 11/15/18 1235 11/15/18 1640 11/15/18 1957 11/15/18 2352 11/16/18 0423  GLUCAP 98 97 99 112* 118*   The patient is critically ill with multiple organ system failure and requires high complexity decision making for assessment and support, frequent evaluation and titration of therapies, advanced monitoring, review of radiographic studies and interpretation of complex data.   Critical Care Time devoted to patient care services, exclusive of separately billable procedures, described in this note is 35 minutes.   Marshell Garfinkel MD [facility] Pager [phone] If no answer call [phone] 11/16/2018, 8:58 AM

## 2018-11-16 NOTE — Progress Notes (Signed)
PROGRESS NOTE  Shannon Meyer  ZOX:[id number] DOB: 10/21/1965 DOA: 10/22/2018 PCP: No primary care provider on file.   Brief Narrative: Shannon BradfordDeborah Meyer is a 53 y.o. female with no significant medical history who presented with cough, weakness and shortness of breath found to have covid-19 with bilateral CXR opacities, intubated in ED and [facility]   Patient's hospital course included failed extubation Lasix/8 and placement of tracheostomy on 6/11.  She has completed course of Remdisivir, Zithromax and received Actemra on 5/20 & 5/21.   Patient at times gets quite agitated violent.  Since she has been more awake, Has had episodes of hypotension and severe sinus tachycardia, responding well to IV metoprolol  Overnight, patient has had a few issues of bleeding around her tracheostomy site.  Careful inspection by respiratory notes no evidence of patient bleeding into tracheostomy site.  Her oxygenation has been good and she was able to maintain breathing on her own without oxygen support through tracheostomy for most of 6/13 and then again this morning.  Still having episodes at times of tachycardia.  Assessment & Plan: Principal Problem:   COVID-19 Active Problems:   Leukocytosis   Sepsis (HCC)   Elevated LFTs   QT prolongation   Acute respiratory failure (HCC)   Pressure injury of skin   Tracheostomy status (HCC)  Acute hypoxic respiratory failure due to covid-19 pneumonia: Failed extubation 6/8, status post tracheostomy 6/11. - Completed remdesivir 5/21 - 5/30, received actemra 5/20, and 5/21. Received CTX 5/20-5/26, azithromycin 5/20-5/24. - Continue airborne, contact precautions. PPE including surgical gown, gloves, face shield, cap, shoe covers, and N-95 used during this encounter in a negative pressure room.  - Maintain euvolemia/net negative.  - Avoid NSAIDs  Patient has been doing well with minimal to no oxygen support.  Agitation: May be secondary to steroids versus ICU  psychosis.  Have started oral medications including Klonopin and attempts to keep her calm as we wean her off of Precedex drip  Hypernatremia:  - Continue free water, sodium normalized  Hypokalemia:  - Supplement and monitor  Positive blood cultures: 1 of 4 bottles, likely coNS contaminant.   Thrombocytopenia: Mild. Plt stable. Discontinuing Lovenox and putting her on SCDs  Obesity: BMI 34.  - Optimize nutritional status  DVT prophylaxis: Lovenox Code Status: Full Family Communication: Updated daughter by phone today Disposition Plan: Hopefully transfer out of ICU in the next few days, once completely off of Precedex drip on oral medications only for agitation  Consultants:   PCCM  Procedures:   5/20 ETT  5/24 R PICC line  6/11 Tracheostomy  Antimicrobials:  Azithromycin 5/20 >> 5/24  Rocephin 5/20 >> 5/26  Remdesivir 5/25 >> 5/30  Actemra 5/20 and 5/21  Diflucan 5/30 >> 6/1    Objective: Vitals:   11/16/18 0800 11/16/18 1100 11/16/18 1200 11/16/18 1300  BP: (!) 141/104 (!) 107/59 (!) 104/56 115/75  Pulse: 96 (!) 129 (!) 119 (!) 119  Resp: (!) 36 (!) 41 (!) 35 (!) 24  Temp:   99.7 F (37.6 C)   TempSrc:   Axillary   SpO2: 93% (!) 87% 96% 99%  Weight:      Height:        Intake/Output Summary (Last 24 hours) at 11/16/2018 1322 Last data filed at 11/16/2018 1200 Gross per 24 hour  Intake 1098.25 ml  Output 1850 ml  Net -751.75 ml   Filed Weights   11/14/18 0500 11/15/18 0500 11/16/18 0500  Weight: 92.9 kg 92 kg  92.5 kg   Gen: 53 y.o. female awakens easily, follows commands HEENT: Status post tracheostomy with ventilator support Pulm: Clear to auscultation bilaterally CV: Borderline tachycardia, regular rate GI: Abdomen soft, non-tender, non-distended, with normoactive bowel sounds.  Ext: Warm, no deformities Skin: No rashes, lesions or ulcers on visualized skin.  Minimal mild bleeding around tracheostomy site Neuro: Rousable, follows most  commands Psych: At times, agitated  Data Reviewed: I have personally reviewed following labs and imaging studies  CBC: Recent Labs  Lab 11/12/18 0403 11/13/18 0330 11/14/18 0220 11/15/18 0142 11/16/18 0330  WBC 9.4 6.6 9.0 8.7 14.7*  HGB 10.7* 10.9* 11.3* 10.2* 12.0  HCT 35.7* 36.1 36.1 33.9* 40.3  MCV 94.7 95.3 94.5 94.7 95.7  PLT 124* 125* 126* 127* 161   Basic Metabolic Panel: Recent Labs  Lab 11/10/18 1657  11/11/18 0355 11/11/18 1640 11/12/18 0403 11/13/18 0330 11/14/18 0220 11/15/18 0142 11/16/18 0330  NA  --    < > 142  --  141 142 140 142 143  K  --    < > 3.3*  --  3.7 3.7 3.4* 3.4* 3.6  CL  --   --  111  --  111 109 108 108 107  CO2  --   --  24  --  25 25 24 26 28   GLUCOSE  --   --  115*  --  97 112* 112* 136* 122*  BUN  --   --  32*  --  30* 33* 27* 24* 18  CREATININE  --   --  0.62  --  0.56 0.46 0.51 0.47 0.46  CALCIUM  --   --  8.3*  --  8.0* 8.4* 8.2* 8.5* 8.9  MG 1.9  --  2.2 2.2 2.0  --   --   --   --   PHOS 4.4  --  4.4 3.9 3.5  --   --   --   --    < > = values in this interval not displayed.   GFR: Estimated Creatinine Clearance: 87.6 mL/min (by C-G formula based on SCr of 0.46 mg/dL). Liver Function Tests: No results for input(s): AST, ALT, ALKPHOS, BILITOT, PROT, ALBUMIN in the last 168 hours. No results for input(s): LIPASE, AMYLASE in the last 168 hours. No results for input(s): AMMONIA in the last 168 hours. Coagulation Profile: Recent Labs  Lab 11/13/18 0854  INR 0.9   Cardiac Enzymes: No results for input(s): CKTOTAL, CKMB, CKMBINDEX, TROPONINI in the last 168 hours. BNP (last 3 results) No results for input(s): PROBNP in the last 8760 hours. HbA1C: No results for input(s): HGBA1C in the last 72 hours. CBG: Recent Labs  Lab 11/15/18 1640 11/15/18 1957 11/15/18 2352 11/16/18 0423 11/16/18 1210  GLUCAP 97 99 112* 118* 104*   Lipid Profile: No results for input(s): CHOL, HDL, LDLCALC, TRIG, CHOLHDL, LDLDIRECT in the last  72 hours. Thyroid Function Tests: No results for input(s): TSH, T4TOTAL, FREET4, T3FREE, THYROIDAB in the last 72 hours. Anemia Panel: No results for input(s): VITAMINB12, FOLATE, FERRITIN, TIBC, IRON, RETICCTPCT in the last 72 hours. Urine analysis:    Component Value Date/Time   COLORURINE STRAW (A) 11/01/2018 0901   APPEARANCEUR HAZY (A) 11/01/2018 0901   LABSPEC 1.010 11/01/2018 0901   PHURINE 5.0 11/01/2018 0901   GLUCOSEU NEGATIVE 11/01/2018 0901   HGBUR NEGATIVE 11/01/2018 0901   BILIRUBINUR NEGATIVE 11/01/2018 0901   KETONESUR NEGATIVE 11/01/2018 0901   PROTEINUR NEGATIVE 11/01/2018 0901  NITRITE NEGATIVE 11/01/2018 0901   LEUKOCYTESUR NEGATIVE 11/01/2018 0901   No results found for this or any previous visit (from the past 240 hour(s)).    Radiology Studies: No results found.  Scheduled Meds: . chlorhexidine  15 mL Mouth/Throat BID  . Chlorhexidine Gluconate Cloth  6 each Topical Daily  . clonazepam  0.5 mg Oral BID  . docusate  100 mg Per Tube BID  . famotidine  20 mg Per Tube Q12H  . feeding supplement (PRO-STAT SUGAR FREE 64)  60 mL Per Tube Daily  . feeding supplement (VITAL HIGH PROTEIN)  1,000 mL Per Tube Q24H  . furosemide  40 mg Oral Daily  . insulin aspart  0-9 Units Subcutaneous Q4H  . mouth rinse  15 mL Mouth Rinse 10 times per day  . midazolam  2 mg Intravenous Once  . multivitamin  15 mL Per Tube Daily  . oxyCODONE  2.5 mg Per Tube Q8H  . potassium chloride  40 mEq Oral BID  . QUEtiapine  100 mg Oral BID  . sodium chloride flush  10-40 mL Intracatheter Q12H  . sodium chloride flush  3 mL Intravenous Q12H  . vecuronium  10 mg Intravenous Once   Continuous Infusions: . sodium chloride 10 mL/hr at 11/16/18 0700  . dexmedetomidine (PRECEDEX) IV infusion 0.8 mcg/kg/hr (11/16/18 1152)  . norepinephrine (LEVOPHED) Adult infusion Stopped (11/13/18 0100)     LOS: 25 days     Hollice EspySendil K Rahcel Shutes, MD [facility] [email or web address] Password [email or web address]  11/16/2018, 1:22 PM

## 2018-11-17 LAB — GLUCOSE, CAPILLARY
Glucose-Capillary: 102 mg/dL — ABNORMAL HIGH (ref 70–99)
Glucose-Capillary: 101 mg/dL — ABNORMAL HIGH (ref 70–99)
Glucose-Capillary: 84 mg/dL (ref 70–99)
Glucose-Capillary: 100 mg/dL — ABNORMAL HIGH (ref 70–99)
Glucose-Capillary: 113 mg/dL — ABNORMAL HIGH (ref 70–99)
Glucose-Capillary: 95 mg/dL (ref 70–99)
Glucose-Capillary: 119 mg/dL — ABNORMAL HIGH (ref 70–99)

## 2018-11-17 MED ORDER — CLONAZEPAM 0.5 MG PO TBDP
1.0000 mg | ORAL_TABLET | Freq: Two times a day (BID) | ORAL | Status: DC
  Administered 2018-11-17: 1 mg via ORAL
  Filled 2018-11-17: qty 2

## 2018-11-17 MED ORDER — OXYCODONE HCL 5 MG PO TABS
5.0000 mg | ORAL_TABLET | Freq: Four times a day (QID) | ORAL | Status: DC
  Administered 2018-11-17 – 2018-11-19 (×8): 5 mg
  Filled 2018-11-17 (×8): qty 1

## 2018-11-17 NOTE — Progress Notes (Signed)
SLP Cancellation Note  Patient Details Name: Shannon Meyer MRN: [id number] DOB: 04-Jul-1965   Cancelled treatment:       Reason Eval/Treat Not Completed: Patient not medically ready. Per chart review, pt has been doing trials of TC during the day. Discussed with RN: today she is on trach collar with T bar but her day has also been complicated by tachycardia, agitation, and bleeding from her trach. She is requiring dilaudid and versed, and is currently restrained. Will hold PMV trials for now - will continue to follow for readiness.   Shannon Meyer 11/17/2018, 4:34 PM  Pollyann Glen, M.A. [place] Acute Environmental education officer [phone] Office [phone]

## 2018-11-17 NOTE — Progress Notes (Signed)
Physical Therapy Cancellation    11/17/18 0847  PT Visit Information  Last PT Received On 11/17/18  Reason Eval/Treat Not Completed Medical issues which prohibited therapy--RN reported pt with high HR and trying to get this rate better controlled.   Will see in pm as schedule permits.   Barry Brunner, PT

## 2018-11-17 NOTE — Progress Notes (Addendum)
NAME:  Shannon BradfordDeborah Meyer, MRN:  [id number], DOB:  07/24/1965, LOS: 26 ADMISSION DATE:  10/22/2018, CONSULTATION DATE: Oct 22, 2018 REFERRING MD:  Pilar PlateBero, CHIEF COMPLAINT: Dyspnea  Brief History   53 year old female with no past medical history [facility] on Oct 22, 2018 with acute respiratory failure with hypoxemia due to COVID-19 pneumonia.  Past Medical History  No known past medical history  Significant Hospital Events   5/20 Intubated in ED and [facility] 5/21-5/26 Weaning FIO2 and PEEP, diuresing 5/26-6/3 Tolerating weaning for 1-2 hours 6/7 weaning on pressure support ventilation again this morning 10/5 6/8 extubated, reintubated (tachypnea, tachycardia) 6/12 Trached 6/13 Trach collar all day  Consults:  Pulmonary and critical care medicine  Procedures:  5/20 ET tube> 6/11  Trach 6/11 >> 5/24 PICC line right arm>   Significant Diagnostic Tests:  Oct 28, 2018 CT angiogram chest no pulmonary embolism, bilateral atypical pneumonia consistent with COVID-19  Micro Data:  SARS-CoV-2 May 20+ Blood culture May 20 1 out of 2 GPC staph, coag negative  Antimicrobials:  Zithromax 5/20 >>5/24 Rocephin 5/20 >>5/26 Remdesivir 5/21 >5/30 Actemra 5/20>5/21 Solumedrol 5/12  > 5/26  Interim history/subjective:  On trach collar since today AM Bleeding from tach better Off precedex  Objective   Blood pressure 140/72, pulse (!) 128, temperature 98.9 F (37.2 C), temperature source Axillary, resp. rate (!) 25, height 5\' 4"  (1.626 m), weight 93 kg, SpO2 98 %.    Vent Mode: PCV FiO2 (%):  [40 %-60 %] 40 % Set Rate:  [20 bmp] 20 bmp PEEP:  [8 cmH20] 8 cmH20 Plateau Pressure:  [28 cmH20-37 cmH20] 35 cmH20   Intake/Output Summary (Last 24 hours) at 11/17/2018 0813 Last data filed at 11/17/2018 0700 Gross per 24 hour  Intake 1316.97 ml  Output 1500 ml  Net -183.03 ml   Filed Weights   11/15/18 0500 11/16/18 0500 11/17/18  0421  Weight: 92 kg 92.5 kg 93 kg    Examination: Gen:      No acute distress HEENT:  EOMI, sclera anicteric Neck:     No masses; no thyromegaly, trach with blood clot.  Lungs:    Clear to auscultation bilaterally; normal respiratory effort CV:         Regular rate and rhythm; no murmurs Abd:      + bowel sounds; soft, non-tender; no palpable masses, no distension Ext:    No edema; adequate peripheral perfusion Skin:      Warm and dry; no rash Neuro: Sedated, arousable  Resolved Hospital Problem list     Assessment & Plan:  Acute respiratory failure with hypoxemia due to COVID-19 pneumonia Very low lung compliance. 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 Speech Holding lovenox for oozing from trach site Thrombi pads  Delirium, encephalopathy Off precedex, ativan PRN On seroqel. Follow qtc Started klonopin  Best practice:  Diet: tube feeding via cortrak Pain/Anxiety/Delirium protocol (if indicated): RASS goal 0 VAP protocol (if indicated): yes DVT prophylaxis: lovenox held, SCDs GI prophylaxis: famotidine Glucose control: SSI Mobility: PT consult, range of motion exercises Code Status: full Family Communication: Called daughter and updated 6/15 Disposition: remain in ICU   Labs   CBC: Recent Labs  Lab 11/12/18 0403 11/13/18 0330 11/14/18 0220 11/15/18 0142 11/16/18 0330  WBC 9.4 6.6 9.0 8.7 14.7*  HGB 10.7* 10.9* 11.3* 10.2* 12.0  HCT 35.7* 36.1 36.1 33.9* 40.3  MCV 94.7 95.3  94.5 94.7 95.7  PLT 124* 125* 126* 127* 683    Basic Metabolic Panel: Recent Labs  Lab 11/10/18 1657  11/11/18 0355 11/11/18 1640 11/12/18 0403 11/13/18 0330 11/14/18 0220 11/15/18 0142 11/16/18 0330  NA  --    < > 142  --  141 142 140 142 143  K  --    < > 3.3*  --  3.7 3.7 3.4* 3.4* 3.6  CL  --    < > 111  --  111 109 108 108 107  CO2  --    < > 24  --  25 25 24 26 28   GLUCOSE  --    < > 115*  --  97 112* 112* 136*  122*  BUN  --    < > 32*  --  30* 33* 27* 24* 18  CREATININE  --    < > 0.62  --  0.56 0.46 0.51 0.47 0.46  CALCIUM  --    < > 8.3*  --  8.0* 8.4* 8.2* 8.5* 8.9  MG 1.9  --  2.2 2.2 2.0  --   --   --   --   PHOS 4.4  --  4.4 3.9 3.5  --   --   --   --    < > = values in this interval not displayed.   GFR: Estimated Creatinine Clearance: 87.8 mL/min (by C-G formula based on SCr of 0.46 mg/dL). Recent Labs  Lab 11/13/18 0330 11/14/18 0220 11/15/18 0142 11/16/18 0330  WBC 6.6 9.0 8.7 14.7*    Liver Function Tests: No results for input(s): AST, ALT, ALKPHOS, BILITOT, PROT, ALBUMIN in the last 168 hours. No results for input(s): LIPASE, AMYLASE in the last 168 hours. No results for input(s): AMMONIA in the last 168 hours.  ABG    Component Value Date/Time   PHART 7.432 11/10/2018 1722   PCO2ART 36.0 11/10/2018 1722   PO2ART 77.0 (L) 11/10/2018 1722   HCO3 23.9 11/10/2018 1722   TCO2 25 11/10/2018 1722   ACIDBASEDEF 1.0 10/24/2018 0419   O2SAT 95.0 11/10/2018 1722     Coagulation Profile: Recent Labs  Lab 11/13/18 0854  INR 0.9    Cardiac Enzymes: No results for input(s): CKTOTAL, CKMB, CKMBINDEX, TROPONINI in the last 168 hours.  HbA1C: No results found for: HGBA1C  CBG: Recent Labs  Lab 11/16/18 1701 11/16/18 1949 11/16/18 2324 11/17/18 0338 11/17/18 0738  GLUCAP 91 91 100* 84 100*   The patient is critically ill with multiple organ system failure and requires high complexity decision making for assessment and support, frequent evaluation and titration of therapies, advanced monitoring, review of radiographic studies and interpretation of complex data.   Critical Care Time devoted to patient care services, exclusive of separately billable procedures, described in this note is 35 minutes.   Marshell Garfinkel MD [place] [facility] Pager [phone] If no answer call [phone] 11/17/2018, 8:13 AM

## 2018-11-17 NOTE — Progress Notes (Signed)
OT Cancellation Note  Patient Details Name: Shannon Meyer MRN: [id number] DOB: [birth year]   Cancelled Treatment:    Reason Eval/Treat Not Completed: Medical issues which prohibited therapy. Spoke with nsg who states HR in the 150s/160s. Nsg working on decreasing heart rate. Will attempt this pm if appropriate.   Ramond Dial, OT/L   Acute OT Clinical Specialist Acute Rehabilitation Services Pager [phone] Office [phone]  11/17/2018, 8:57 AM

## 2018-11-17 NOTE — Progress Notes (Addendum)
PROGRESS NOTE  Shannon Meyer  WUJ:[id number] DOB: 12-12-1965 DOA: 10/22/2018 PCP: No primary care provider on file.   Brief Narrative: Shannon Meyer is a 53 y.o. female with no significant medical history who presented with cough, weakness and shortness of breath found to have covid-19 with bilateral CXR opacities, intubated in ED and admitted to [facility] 5/20.   Assessment & Plan: Principal Problem:   COVID-19 Active Problems:   Leukocytosis   Sepsis ([place])   Elevated LFTs   QT prolongation   Acute respiratory failure (HCC)   Pressure injury of skin   Tracheostomy status (HCC)  Acute hypoxic respiratory failure due to covid-19 pneumonia: Failed extubation 6/8. - Complete remdesivir 5/21 - 5/30, received actemra 5/20, and 5/21. Received CTX 5/20-5/26, azithromycin 5/20-5/24. - Continue airborne, contact precautions. PPE including surgical gown, gloves, face shield, cap, shoe covers, and N-95 used during this encounter in a negative pressure room.  - Maintain euvolemia/net negative.  - Avoid NSAIDs  - Wean to trach collar as much as possible. - Diurese as able.  ICU delirium:  - Continue scheduled seroquel 100mg  BID - Increase clonazepam to 1mg  BID - Increase oxyIR to 5mg  q6h  Hypernatremia: Resolved. - Continue free water  Hypokalemia:  - Supplement and monitor  Positive blood cultures: 1 of 4 bottles, suspect CoNS contaminant.   Thrombocytopenia: Mild. Plt stable. - Ok to continue lovenox 40mg  q12h per covid ICU protocol.  Obesity: BMI 34.  - Optimize nutritional status  DVT prophylaxis: Lovenox Code Status: Full Family Communication: Per PCCM Disposition Plan: Remain in ICU  Consultants:   PCCM  Procedures:   5/20 ETT  5/24 R PICC line  6/11 Tracheostomy  Antimicrobials:  Azithromycin 5/20 >> 5/24  Rocephin 5/20 >> 5/26  Remdesivir 5/25 >> 5/30  Actemra 5/20 and 5/21  Diflucan 5/30 >> 6/1   Subjective: Was on vent last night. Continues to require  IV pushes of sedation for agitation.   Objective: Vitals:   11/17/18 0749 11/17/18 1100 11/17/18 1123 11/17/18 1400  BP: 140/72  (!) 143/82   Pulse: (!) 128  (!) 142   Resp: (!) 25  (!) 35   Temp:  (!) 100.8 F (38.2 C)  99.9 F (37.7 C)  TempSrc:      SpO2: 98%  100%   Weight:      Height:        Intake/Output Summary (Last 24 hours) at 11/17/2018 1424 Last data filed at 11/17/2018 1200 Gross per 24 hour  Intake 918.91 ml  Output 1900 ml  Net -981.09 ml   Filed Weights   11/15/18 0500 11/16/18 0500 11/17/18 0421  Weight: 92 kg 92.5 kg 93 kg   Gen: 53 y.o. female in no distress Pulm: Nonlabored on trach collar. Trach site hemostatic with blood clot. CV: Regular rate and rhythm. No murmur, rub, or gallop. No JVD, no dependent edema. GI: Abdomen soft, non-tender, non-distended, with normoactive bowel sounds.  Ext: Warm, no deformities Skin: No new rashes, lesions or ulcers on visualized skin. Neuro: Sedated, rousable, not following commands.  Psych: UTD  Data Reviewed: I have personally reviewed following labs and imaging studies  CBC: Recent Labs  Lab 11/12/18 0403 11/13/18 0330 11/14/18 0220 11/15/18 0142 11/16/18 0330  WBC 9.4 6.6 9.0 8.7 14.7*  HGB 10.7* 10.9* 11.3* 10.2* 12.0  HCT 35.7* 36.1 36.1 33.9* 40.3  MCV 94.7 95.3 94.5 94.7 95.7  PLT 124* 125* 126* 127* 956   Basic Metabolic Panel: Recent Labs  Lab  11/10/18 1657  11/11/18 0355 11/11/18 1640 11/12/18 0403 11/13/18 0330 11/14/18 0220 11/15/18 0142 11/16/18 0330  NA  --    < > 142  --  141 142 140 142 143  K  --    < > 3.3*  --  3.7 3.7 3.4* 3.4* 3.6  CL  --    < > 111  --  111 109 108 108 107  CO2  --    < > 24  --  25 25 24 26 28   GLUCOSE  --    < > 115*  --  97 112* 112* 136* 122*  BUN  --    < > 32*  --  30* 33* 27* 24* 18  CREATININE  --    < > 0.62  --  0.56 0.46 0.51 0.47 0.46  CALCIUM  --    < > 8.3*  --  8.0* 8.4* 8.2* 8.5* 8.9  MG 1.9  --  2.2 2.2 2.0  --   --   --   --   PHOS  4.4  --  4.4 3.9 3.5  --   --   --   --    < > = values in this interval not displayed.   GFR: Estimated Creatinine Clearance: 87.8 mL/min (by C-G formula based on SCr of 0.46 mg/dL). Liver Function Tests: No results for input(s): AST, ALT, ALKPHOS, BILITOT, PROT, ALBUMIN in the last 168 hours. No results for input(s): LIPASE, AMYLASE in the last 168 hours. No results for input(s): AMMONIA in the last 168 hours. Coagulation Profile: Recent Labs  Lab 11/13/18 0854  INR 0.9   Cardiac Enzymes: No results for input(s): CKTOTAL, CKMB, CKMBINDEX, TROPONINI in the last 168 hours. BNP (last 3 results) No results for input(s): PROBNP in the last 8760 hours. HbA1C: No results for input(s): HGBA1C in the last 72 hours. CBG: Recent Labs  Lab 11/16/18 1949 11/16/18 2324 11/17/18 0338 11/17/18 0738 11/17/18 1151  GLUCAP 91 100* 84 100* 113*   Lipid Profile: No results for input(s): CHOL, HDL, LDLCALC, TRIG, CHOLHDL, LDLDIRECT in the last 72 hours. Thyroid Function Tests: No results for input(s): TSH, T4TOTAL, FREET4, T3FREE, THYROIDAB in the last 72 hours. Anemia Panel: No results for input(s): VITAMINB12, FOLATE, FERRITIN, TIBC, IRON, RETICCTPCT in the last 72 hours. Urine analysis:    Component Value Date/Time   COLORURINE STRAW (A) 11/01/2018 0901   APPEARANCEUR HAZY (A) 11/01/2018 0901   LABSPEC 1.010 11/01/2018 0901   PHURINE 5.0 11/01/2018 0901   GLUCOSEU NEGATIVE 11/01/2018 0901   HGBUR NEGATIVE 11/01/2018 0901   BILIRUBINUR NEGATIVE 11/01/2018 0901   KETONESUR NEGATIVE 11/01/2018 0901   PROTEINUR NEGATIVE 11/01/2018 0901   NITRITE NEGATIVE 11/01/2018 0901   LEUKOCYTESUR NEGATIVE 11/01/2018 0901   No results found for this or any previous visit (from the past 240 hour(s)).    Radiology Studies: No results found.  Scheduled Meds: . chlorhexidine  15 mL Mouth/Throat BID  . Chlorhexidine Gluconate Cloth  6 each Topical Daily  . clonazepam  1 mg Oral BID  . docusate   100 mg Per Tube BID  . famotidine  20 mg Per Tube Q12H  . feeding supplement (PRO-STAT SUGAR FREE 64)  60 mL Per Tube Daily  . feeding supplement (VITAL HIGH PROTEIN)  1,000 mL Per Tube Q24H  . furosemide  40 mg Oral Daily  . insulin aspart  0-9 Units Subcutaneous Q4H  . mouth rinse  15 mL Mouth Rinse 10  times per day  . multivitamin  15 mL Per Tube Daily  . oxyCODONE  5 mg Per Tube Q6H  . potassium chloride  40 mEq Oral BID  . QUEtiapine  100 mg Oral BID  . sodium chloride flush  10-40 mL Intracatheter Q12H  . sodium chloride flush  3 mL Intravenous Q12H  . vecuronium  10 mg Intravenous Once   Continuous Infusions: . sodium chloride Stopped (11/17/18 0219)     LOS: 26 days   Time spent: 35 minutes.  Tyrone Nineyan B Noemie Devivo, MD [facility] [email or web address] Password [email or web address] 11/17/2018, 2:24 PM

## 2018-11-17 NOTE — Progress Notes (Signed)
Spoke with patient's daughter, [name] and gave her updates on her mother. She thanked me for updates. I told her we were trying to ween patient off the vent and that we would notify her if there were any changes. Patient will call if she has further questions or concerns.

## 2018-11-17 NOTE — Plan of Care (Signed)
  Problem: Nutrition: Goal: Adequate nutrition will be maintained Outcome: Progressing   

## 2018-11-18 LAB — COMPREHENSIVE METABOLIC PANEL
ALT: 43 U/L (ref 0–44)
AST: 39 U/L (ref 15–41)
Albumin: 3.1 g/dL — ABNORMAL LOW (ref 3.5–5.0)
Alkaline Phosphatase: 158 U/L — ABNORMAL HIGH (ref 38–126)
Anion gap: 11 (ref 5–15)
BUN: 22 mg/dL — ABNORMAL HIGH (ref 6–20)
CO2: 30 mmol/L (ref 22–32)
Calcium: 9 mg/dL (ref 8.9–10.3)
Chloride: 100 mmol/L (ref 98–111)
Creatinine, Ser: 0.48 mg/dL (ref 0.44–1.00)
GFR calc Af Amer: >60 mL/min (ref >60)
GFR calc non Af Amer: >60 mL/min (ref >60)
Glucose, Bld: 134 mg/dL — ABNORMAL HIGH (ref 70–99)
Potassium: 4.4 mmol/L (ref 3.5–5.1)
Sodium: 141 mmol/L (ref 135–145)
Total Bilirubin: 0.4 mg/dL (ref 0.3–1.2)
Total Protein: 6.4 g/dL — ABNORMAL LOW (ref 6.5–8.1)

## 2018-11-18 LAB — CBC WITH DIFFERENTIAL/PLATELET
Abs Immature Granulocytes: 0.07 10*3/uL (ref 0.00–0.07)
Basophils Absolute: 0 10*3/uL (ref 0.0–0.1)
Basophils Relative: 0 %
Eosinophils Absolute: 0.8 10*3/uL — ABNORMAL HIGH (ref 0.0–0.5)
Eosinophils Relative: 6 %
HCT: 37.4 % (ref 36.0–46.0)
Hemoglobin: 11.6 g/dL — ABNORMAL LOW (ref 12.0–15.0)
Immature Granulocytes: 1 %
Lymphocytes Relative: 14 %
Lymphs Abs: 2.1 10*3/uL (ref 0.7–4.0)
MCH: 30 pg (ref 26.0–34.0)
MCHC: 31 g/dL (ref 30.0–36.0)
MCV: 96.6 fL (ref 80.0–100.0)
Monocytes Absolute: 1.7 10*3/uL — ABNORMAL HIGH (ref 0.1–1.0)
Monocytes Relative: 11 %
Neutro Abs: 10 10*3/uL — ABNORMAL HIGH (ref 1.7–7.7)
Neutrophils Relative %: 68 %
Platelets: 231 10*3/uL (ref 150–400)
RBC: 3.87 MIL/uL (ref 3.87–5.11)
RDW: 18.6 % — ABNORMAL HIGH (ref 11.5–15.5)
WBC: 14.7 10*3/uL — ABNORMAL HIGH (ref 4.0–10.5)
nRBC: 0 % (ref 0.0–0.2)

## 2018-11-18 LAB — GLUCOSE, CAPILLARY
Glucose-Capillary: 103 mg/dL — ABNORMAL HIGH (ref 70–99)
Glucose-Capillary: 112 mg/dL — ABNORMAL HIGH (ref 70–99)
Glucose-Capillary: 116 mg/dL — ABNORMAL HIGH (ref 70–99)
Glucose-Capillary: 117 mg/dL — ABNORMAL HIGH (ref 70–99)
Glucose-Capillary: 120 mg/dL — ABNORMAL HIGH (ref 70–99)
Glucose-Capillary: 133 mg/dL — ABNORMAL HIGH (ref 70–99)
Glucose-Capillary: 134 mg/dL — ABNORMAL HIGH (ref 70–99)

## 2018-11-18 LAB — C-REACTIVE PROTEIN: CRP: 2.2 mg/dL — ABNORMAL HIGH (ref <1.0)

## 2018-11-18 MED ORDER — FUROSEMIDE 8 MG/ML PO SOLN
40.0000 mg | Freq: Every day | ORAL | Status: DC
  Filled 2018-11-18: qty 5

## 2018-11-18 MED ORDER — METOPROLOL TARTRATE 25 MG/10 ML ORAL SUSPENSION
25.0000 mg | Freq: Two times a day (BID) | ORAL | Status: DC
  Administered 2018-11-18 – 2018-11-23 (×10): 25 mg
  Filled 2018-11-18 (×11): qty 10

## 2018-11-18 MED ORDER — FUROSEMIDE 20 MG PO TABS
40.0000 mg | ORAL_TABLET | Freq: Every day | ORAL | Status: DC
  Administered 2018-11-18 – 2018-11-28 (×10): 40 mg
  Filled 2018-11-18 (×11): qty 1

## 2018-11-18 MED ORDER — CLONAZEPAM 0.5 MG PO TBDP
1.0000 mg | ORAL_TABLET | Freq: Two times a day (BID) | ORAL | Status: DC
  Administered 2018-11-18 (×2): 1 mg
  Filled 2018-11-18 (×3): qty 2

## 2018-11-18 MED ORDER — OSMOLITE 1.2 CAL PO LIQD
1000.0000 mL | ORAL | Status: DC
  Administered 2018-11-18: 1000 mL

## 2018-11-18 MED ORDER — PRO-STAT SUGAR FREE PO LIQD
30.0000 mL | Freq: Three times a day (TID) | ORAL | Status: DC
  Administered 2018-11-18 – 2018-11-20 (×6): 30 mL
  Filled 2018-11-18 (×6): qty 30

## 2018-11-18 MED ORDER — HEPARIN SODIUM (PORCINE) 5000 UNIT/ML IJ SOLN
5000.0000 [IU] | Freq: Three times a day (TID) | INTRAMUSCULAR | Status: DC
  Administered 2018-11-18 – 2018-11-19 (×4): 5000 [IU] via SUBCUTANEOUS
  Filled 2018-11-18 (×4): qty 1

## 2018-11-18 MED ORDER — POTASSIUM CHLORIDE 20 MEQ/15ML (10%) PO SOLN
40.0000 meq | Freq: Two times a day (BID) | ORAL | Status: DC
  Administered 2018-11-18 – 2018-11-22 (×9): 40 meq
  Filled 2018-11-18 (×11): qty 30

## 2018-11-18 MED ORDER — QUETIAPINE FUMARATE 50 MG PO TABS
100.0000 mg | ORAL_TABLET | Freq: Two times a day (BID) | ORAL | Status: DC
  Administered 2018-11-18 (×2): 100 mg
  Filled 2018-11-18 (×3): qty 2

## 2018-11-18 NOTE — Progress Notes (Signed)
Spoke with Tommie Ard, patients daughter, and gave her updates. She asked about possibility of being able to facetime to see her mom without actually allowing her mom to see her because the last session was so emotional for them both. Advised that I would pass that along and offered to do the FaceTime while I had her on the phone. She didn't want to because she wasn't "prepared" for it. [name] expressed concern of her mom possibly moving to the progressive floor and not having the one on one like she does in ICU. Advised I would pass all her concerns along.

## 2018-11-18 NOTE — Progress Notes (Signed)
Physical Therapy Treatment Patient Details Name: Shannon Meyer MRN: [id number] DOB: Jan 02, 1966 Today's Date: 11/18/2018    History of Present Illness Pt adm 5/20 with hypoxic resp failure due to Covid 19. Pt intubated 5/20 and extubated 6/8, with re-intubation 6/8; Trach 6/11. PMH - none.     PT Comments    Patient drowsy, but arousable. RR and HR continue to be elevated at rest and continue to be limiting factor during therapy session. She followed all 1 step commands (with some delay), and was able to assist with trunk control while sitting at EOB. End of session, pt returned to supine and remained restless with elevated HR, RR with RN providing IV meds to help relax patient.     Follow Up Recommendations  CIR     Equipment Recommendations  Other (comment)    Recommendations for Other Services       Precautions / Restrictions Precautions Precautions: Fall Precaution Comments: t-bar, multiple lines    Mobility  Bed Mobility Overal bed mobility: Needs Assistance Bed Mobility: Supine to Sit;Sit to Sidelying     Supine to sit: Total assist;+2 for physical assistance;HOB elevated   Sit to sidelying: Max assist;+2 for physical assistance;+2 for safety/equipment General bed mobility comments: HOB elevated assisted to dangle at EOB; returned to sidelying, then supine  Transfers                 General transfer comment: RR and HR too high to progress  Ambulation/Gait                 Stairs             Wheelchair Mobility    Modified Rankin (Stroke Patients Only)       Balance   Sitting-balance support: Feet unsupported;Bilateral upper extremity supported(very little, if any support thru hands on bed) Sitting balance-[name] Scale: Zero Sitting balance - Comments: sat EOB ~8 minutes with 2 person mod-max asssist (most often max assist due to level of fatigue/alertness); able to raise her head on command and nearly achieve neutral head position for  up to 5 seconds Postural control: Posterior lean                                  Cognition Arousal/Alertness: Lethargic Behavior During Therapy: Restless Overall Cognitive Status: Difficult to assess                     Current Attention Level: Sustained   Following Commands: Follows one step commands consistently;Follows one step commands with increased time   Awareness: Intellectual Problem Solving: Slow processing;Decreased initiation General Comments: gives "thumbs up" with either hand, raises one finger, AAROM bil UEs,       Exercises Other Exercises Other Exercises: PROM bil hip internal rotation in supine with pt easily moving beyond neutral    General Comments General comments (skin integrity, edema, etc.): Pre- and post-activity--HR 126 to 150, SaO2 on 30% FiO2 100% down to 90%, RR 42 to 60, BP 135/86 to 143/117      Pertinent Vitals/Pain Pain Assessment: Faces Faces Pain Scale: Hurts little more Pain Location: generalized(?pt pointing to peri-area??) Pain Descriptors / Indicators: Discomfort;Grimacing Pain Intervention(s): Monitored during session;Repositioned;Relaxation;Other (comment)(RN provided meds at end of session )    Home Living                      Prior Function  PT Goals (current goals can now be found in the care plan section) Acute Rehab PT Goals Patient Stated Goal: none stated Time For Goal Achievement: 11/24/18 Progress towards PT goals: Progressing toward goals    Frequency    Min 3X/week      PT Plan Current plan remains appropriate    Co-evaluation PT/OT/SLP Co-Evaluation/Treatment: Yes Reason for Co-Treatment: Complexity of the patient's impairments (multi-system involvement);For patient/therapist safety PT goals addressed during session: Mobility/safety with mobility;Balance        AM-PAC PT "6 Clicks" Mobility   Outcome Measure  Help needed turning from your back to your side  while in a flat bed without using bedrails?: Total Help needed moving from lying on your back to sitting on the side of a flat bed without using bedrails?: Total Help needed moving to and from a bed to a chair (including a wheelchair)?: Total Help needed standing up from a chair using your arms (e.g., wheelchair or bedside chair)?: Total Help needed to walk in hospital room?: Total Help needed climbing 3-5 steps with a railing? : Total 6 Click Score: 6    End of Session Equipment Utilized During Treatment: Oxygen Activity Tolerance: Treatment limited secondary to medical complications (Comment);Patient limited by fatigue Patient left: in bed;with nursing/sitter in room;with restraints reapplied(bil wrist restraints) Nurse Communication: (RN present throughout) PT Visit Diagnosis: Other abnormalities of gait and mobility (R26.89);Muscle weakness (generalized) (M62.81)     Time: [id number] PT Time Calculation (min) (ACUTE ONLY): 35 min  Charges:  $Therapeutic Activity: 8-22 mins                        [employer] Sciences CorporationLynn P Hiilei Gerst, PT 11/18/2018, 9:56 AM

## 2018-11-18 NOTE — Progress Notes (Signed)
Pt daughter, [name], called while I stepped off the floor wanting updates. Attempted to call patient back on her cell phone. Left her a message to call back.

## 2018-11-18 NOTE — Progress Notes (Addendum)
PROGRESS NOTE  Shannon BradfordDeborah Dumler  ZOX:[id number] DOB: 02/25/1966 DOA: 10/22/2018 PCP: No primary care provider on file.   Brief Narrative: Shannon Meyer is a 53 y.o. female with no significant medical history who presented with cough, weakness and shortness of breath found to have covid-19 with bilateral CXR opacities, intubated in ED and [facility].   Assessment & Plan: Principal Problem:   COVID-19 Active Problems:   Leukocytosis   Sepsis (HCC)   Elevated LFTs   QT prolongation   Acute respiratory failure (HCC)   Pressure injury of skin   Tracheostomy status (HCC)  Acute hypoxic respiratory failure due to covid-19 pneumonia: Failed extubation 6/8. - Complete remdesivir 5/21 - 5/30, received actemra 5/20, and 5/21. Received CTX 5/20-5/26, azithromycin 5/20-5/24. - Continue airborne, contact precautions. PPE including surgical gown, gloves, face shield, cap, shoe covers, and N-95 used during this encounter in a negative pressure room.  - Maintain euvolemia/net negative.  - Avoid NSAIDs  - Continue trach collar.  - Diurese as able.  ICU delirium:  - Continue scheduled seroquel 100mg  BID, clonazepam 1mg  BID, oxyIR 5mg  q6h - Soft restraints as last resort to prevent manipulating tube.   Sinus tachycardia: 130's - 160's. No dehydration, fever. Hgb stable. Not hypotensive.  - Will give metoprolol and continue monitoring.  - TSH pending   Hypernatremia: Resolved. - Continue free water  Hypokalemia:  - Supplement and monitor  Positive blood cultures: 1 of 4 bottles, suspect CoNS contaminant.   Thrombocytopenia: Mild. Plt stable. - Ok to continue lovenox 40mg  q12h per covid ICU protocol.  Obesity: BMI 34.  - Optimize nutritional status  DVT prophylaxis: Lovenox Code Status: Full Family Communication: Per PCCM Disposition Plan: Remain in ICU. Therapy recommending CIR eventual disposition.   Consultants:   PCCM  Procedures:   5/20 ETT  5/24 R PICC line  6/11  Tracheostomy  Antimicrobials:  Azithromycin 5/20 >> 5/24  Rocephin 5/20 >> 5/26  Remdesivir 5/25 >> 5/30  Actemra 5/20 and 5/21  Diflucan 5/30 >> 6/1   Subjective: Trach collar without vent x24 hours. Remains very tachycardic without ongoing bleeding at trach site.  Objective: Vitals:   11/18/18 1000 11/18/18 1100 11/18/18 1136 11/18/18 1200  BP: (!) 143/70 140/88  (!) 144/71  Pulse: (!) 116 (!) 122 (!) 112 (!) 116  Resp: (!) 26 18 (!) 33 (!) 24  Temp:   98.4 F (36.9 C)   TempSrc:   Oral   SpO2: 100% 100% 100% 100%  Weight:      Height:        Intake/Output Summary (Last 24 hours) at 11/18/2018 1344 Last data filed at 11/18/2018 1200 Gross per 24 hour  Intake 1237.65 ml  Output 1450 ml  Net -212.35 ml   Filed Weights   11/16/18 0500 11/17/18 0421 11/18/18 0500  Weight: 92.5 kg 93 kg 88.6 kg   Gen: 53 y.o. female in no distress, working with OT, sitting with assistance Pulm: Trach collar, crackles at bases.  CV: Regular rate and rhythm. No murmur, rub, or gallop. No JVD, no dependent edema. GI: Abdomen soft, non-tender, non-distended, with normoactive bowel sounds.  Ext: Warm, no deformities Skin: Some small hemostatic clot on gauze around tracheostomy. No other rashes, lesions or ulcers on visualized skin. Neuro: Opens eyes, follows commands, no focal deficits but diffusely very weak.  Psych: Nonverbal.  Data Reviewed: I have personally reviewed following labs and imaging studies  CBC: Recent Labs  Lab 11/13/18 0330 11/14/18 0220 11/15/18  0142 11/16/18 0330 11/18/18 0745  WBC 6.6 9.0 8.7 14.7* 14.7*  NEUTROABS  --   --   --   --  10.0*  HGB 10.9* 11.3* 10.2* 12.0 11.6*  HCT 36.1 36.1 33.9* 40.3 37.4  MCV 95.3 94.5 94.7 95.7 96.6  PLT 125* 126* 127* 180 231   Basic Metabolic Panel: Recent Labs  Lab 11/11/18 1640  11/12/18 0403 11/13/18 0330 11/14/18 0220 11/15/18 0142 11/16/18 0330 11/18/18 0745  NA  --    < > 141 142 140 142 143 141  K  --     < > 3.7 3.7 3.4* 3.4* 3.6 4.4  CL  --    < > 111 109 108 108 107 100  CO2  --    < > 25 25 24 26 28 30   GLUCOSE  --    < > 97 112* 112* 136* 122* 134*  BUN  --    < > 30* 33* 27* 24* 18 22*  CREATININE  --    < > 0.56 0.46 0.51 0.47 0.46 0.48  CALCIUM  --    < > 8.0* 8.4* 8.2* 8.5* 8.9 9.0  MG 2.2  --  2.0  --   --   --   --   --   PHOS 3.9  --  3.5  --   --   --   --   --    < > = values in this interval not displayed.   GFR: Estimated Creatinine Clearance: 85.7 mL/min (by C-G formula based on SCr of 0.48 mg/dL). Liver Function Tests: Recent Labs  Lab 11/18/18 0745  AST 39  ALT 43  ALKPHOS 158*  BILITOT 0.4  PROT 6.4*  ALBUMIN 3.1*   No results for input(s): LIPASE, AMYLASE in the last 168 hours. No results for input(s): AMMONIA in the last 168 hours. Coagulation Profile: Recent Labs  Lab 11/13/18 0854  INR 0.9   Cardiac Enzymes: No results for input(s): CKTOTAL, CKMB, CKMBINDEX, TROPONINI in the last 168 hours. BNP (last 3 results) No results for input(s): PROBNP in the last 8760 hours. HbA1C: No results for input(s): HGBA1C in the last 72 hours. CBG: Recent Labs  Lab 11/17/18 1951 11/18/18 0024 11/18/18 0341 11/18/18 0752 11/18/18 1151  GLUCAP 119* 116* 112* 120* 133*   Lipid Profile: No results for input(s): CHOL, HDL, LDLCALC, TRIG, CHOLHDL, LDLDIRECT in the last 72 hours. Thyroid Function Tests: No results for input(s): TSH, T4TOTAL, FREET4, T3FREE, THYROIDAB in the last 72 hours. Anemia Panel: No results for input(s): VITAMINB12, FOLATE, FERRITIN, TIBC, IRON, RETICCTPCT in the last 72 hours. Urine analysis:    Component Value Date/Time   COLORURINE STRAW (A) 11/01/2018 0901   APPEARANCEUR HAZY (A) 11/01/2018 0901   LABSPEC 1.010 11/01/2018 0901   PHURINE 5.0 11/01/2018 0901   GLUCOSEU NEGATIVE 11/01/2018 0901   HGBUR NEGATIVE 11/01/2018 0901   BILIRUBINUR NEGATIVE 11/01/2018 0901   KETONESUR NEGATIVE 11/01/2018 0901   PROTEINUR NEGATIVE  11/01/2018 0901   NITRITE NEGATIVE 11/01/2018 0901   LEUKOCYTESUR NEGATIVE 11/01/2018 0901   No results found for this or any previous visit (from the past 240 hour(s)).    Radiology Studies: No results found.  Scheduled Meds: . chlorhexidine  15 mL Mouth/Throat BID  . Chlorhexidine Gluconate Cloth  6 each Topical Daily  . clonazepam  1 mg Per Tube BID  . docusate  100 mg Per Tube BID  . famotidine  20 mg Per Tube Q12H  .  feeding supplement (PRO-STAT SUGAR FREE 64)  30 mL Per Tube TID  . feeding supplement (VITAL HIGH PROTEIN)  1,000 mL Per Tube Q24H  . furosemide  40 mg Per Tube Daily  . heparin injection (subcutaneous)  5,000 Units Subcutaneous Q8H  . insulin aspart  0-9 Units Subcutaneous Q4H  . mouth rinse  15 mL Mouth Rinse 10 times per day  . multivitamin  15 mL Per Tube Daily  . oxyCODONE  5 mg Per Tube Q6H  . potassium chloride  40 mEq Per Tube BID  . QUEtiapine  100 mg Per Tube BID  . sodium chloride flush  10-40 mL Intracatheter Q12H  . sodium chloride flush  3 mL Intravenous Q12H  . vecuronium  10 mg Intravenous Once   Continuous Infusions: . sodium chloride 10 mL/hr at 11/18/18 1200  . feeding supplement (OSMOLITE 1.2 CAL)       LOS: 27 days   Time spent: 35 minutes.  Patrecia Pour, MD [facility] [email or web address] Password [email or web address] 11/18/2018, 1:44 PM

## 2018-11-18 NOTE — Progress Notes (Signed)
NAME:  Shannon Meyer, MRN:  [id number], DOB:  06/19/1965, LOS: 6 ADMISSION DATE:  10/22/2018, CONSULTATION DATE:  10/22/2018 REFERRING MD:  Sedonia Small, CHIEF COMPLAINT:  Dyspnea   Brief History   53 y/o female with no PMH admitted from [facility] on Oct 22, 2018 due to ARDS due to COVID 19 pneumonia.   Past Medical History  None  Significant Hospital Events   5/20 Intubated in ED and admitted to [facility] ICU 5/21-5/26 Weaning FIO2 and PEEP, diuresing 5/26-6/3 Tolerating weaning for 1-2 hours 6/7 weaning on pressure support ventilation again this morning 10/5 6/8 extubated, reintubated (tachypnea, tachycardia) 6/12 Trached 6/13 Trach collar all day   Consults:  Pulmonary and critical care medicine  Procedures:  5/20 ET tube>6/11  Trach 6/11 >> 6/7 PICC line right arm>  Significant Diagnostic Tests:  Oct 28, 2018 CT angiogram chest no pulmonary embolism, bilateral atypical pneumonia consistent with COVID-19  Micro Data:  SARS-CoV-2 May 20+ Blood culture May 20 1 out of 2 GPC staph, coag negative  Antimicrobials:  Zithromax 5/20 >>5/24 Rocephin 5/20 >>5/26 Remdesivir 5/21 >5/30 Actemra 5/20>5/21 Solumedrol 5/12  > 5/26   Interim history/subjective:  Has remained on trach collar, off of mechanical ventilation for 24 hours Minimal oozing from tracheostomy site Remains off of Precedex Working with occupational/physical therapy today, set up on side of bed Remains quite weak.  Objective   Blood pressure (!) 141/94, pulse (!) 120, temperature 99.7 F (37.6 C), temperature source Oral, resp. rate 19, height 5\' 4"  (1.626 m), weight 88.6 kg, SpO2 100 %.    FiO2 (%):  [40 %] 40 %   Intake/Output Summary (Last 24 hours) at 11/18/2018 0805 Last data filed at 11/18/2018 0700 Gross per 24 hour  Intake 876.79 ml  Output 2100 ml  Net -1223.21 ml   Filed Weights   11/16/18 0500 11/17/18 0421 11/18/18 0500  Weight: 92.5 kg 93 kg 88.6 kg    Examination:   General:  In bed on trach collar HENT: NCAT tracheostomy site with dried blood, no oozing or bleeding PULM: Crackles bases B,  CV: RRR, no mgr GI: BS+, soft, nontender MSK: normal bulk and tone Neuro: opens eyes, follows commands, generally very weak    Resolved Hospital Problem list     Assessment & Plan:  ARDS due to COVID-19 pneumonia Continue to maintain off of mechanical ventilation today Tracheostomy management per routine Monitor tracheostomy for bleeding Speech therapy today Out of bed, to chair  Delirium/ICU encephalopathy Maintain off Precedex Continue oxycodone, clonazepam, quetiapine  Severe physical deconditioning: Physical therapy consult  Best practice:  Diet: Tube feeding Pain/Anxiety/Delirium protocol (if indicated): RA SS goal 0 VAP protocol (if indicated): Yes DVT prophylaxis: SCDs GI prophylaxis: Famotidine Glucose control: SSI Mobility: PT consult Code Status: Full Family Communication: will contact today Disposition: remain in ICU  Labs   CBC: Recent Labs  Lab 11/12/18 0403 11/13/18 0330 11/14/18 0220 11/15/18 0142 11/16/18 0330  WBC 9.4 6.6 9.0 8.7 14.7*  HGB 10.7* 10.9* 11.3* 10.2* 12.0  HCT 35.7* 36.1 36.1 33.9* 40.3  MCV 94.7 95.3 94.5 94.7 95.7  PLT 124* 125* 126* 127* 332    Basic Metabolic Panel: Recent Labs  Lab 11/11/18 1640 11/12/18 0403 11/13/18 0330 11/14/18 0220 11/15/18 0142 11/16/18 0330  NA  --  141 142 140 142 143  K  --  3.7 3.7 3.4* 3.4* 3.6  CL  --  111 109 108 108 107  CO2  --  25 25 24  26 28  GLUCOSE  --  97 112* 112* 136* 122*  BUN  --  30* 33* 27* 24* 18  CREATININE  --  0.56 0.46 0.51 0.47 0.46  CALCIUM  --  8.0* 8.4* 8.2* 8.5* 8.9  MG 2.2 2.0  --   --   --   --   PHOS 3.9 3.5  --   --   --   --    GFR: Estimated Creatinine Clearance: 85.7 mL/min (by C-G formula based on SCr of 0.46 mg/dL). Recent Labs  Lab 11/13/18 0330 11/14/18 0220 11/15/18 0142 11/16/18 0330  WBC 6.6 9.0 8.7 14.7*     Liver Function Tests: No results for input(s): AST, ALT, ALKPHOS, BILITOT, PROT, ALBUMIN in the last 168 hours. No results for input(s): LIPASE, AMYLASE in the last 168 hours. No results for input(s): AMMONIA in the last 168 hours.  ABG    Component Value Date/Time   PHART 7.432 11/10/2018 1722   PCO2ART 36.0 11/10/2018 1722   PO2ART 77.0 (L) 11/10/2018 1722   HCO3 23.9 11/10/2018 1722   TCO2 25 11/10/2018 1722   ACIDBASEDEF 1.0 10/24/2018 0419   O2SAT 95.0 11/10/2018 1722     Coagulation Profile: Recent Labs  Lab 11/13/18 0854  INR 0.9    Cardiac Enzymes: No results for input(s): CKTOTAL, CKMB, CKMBINDEX, TROPONINI in the last 168 hours.  HbA1C: No results found for: HGBA1C  CBG: Recent Labs  Lab 11/17/18 1609 11/17/18 1951 11/18/18 0024 11/18/18 0341 11/18/18 0752  GLUCAP 95 119* 116* 112* 120*    Critical care time: 40 minutes    Heber CarolinaBrent McQuaid, MD [facility] Pager: [phone] Cell: [phone] If no response, call [phone]

## 2018-11-18 NOTE — Evaluation (Signed)
[facility]y-[facility] Speaking Valve - Evaluation Patient Details  Name: Shannon Meyer MRN: [id number] Date of Birth: [birth year]  Today's Date: 11/18/2018 Time: 5361-4431 SLP Time Calculation (min) (ACUTE ONLY): 22 min  Past Medical History: History reviewed. No pertinent past medical history. Past Surgical History: History reviewed. No pertinent surgical history. HPI:  Pt adm 5/20 with hypoxic resp failure due to Covid 19. Pt intubated 5/20 and extubated 6/8, with re-intubation 6/8; Trach 6/11. PMH - none.    Assessment / Plan / Recommendation Clinical Impression  Pt has fluctuating alertness, but wore tolerated cuff deflation and PMV placement with stable VS and no evidence of air trapping. RT was present and assisted with tracheal suction around cuff deflation. Although pt was not consistently responsive to questions, she did follow some commands and produced a few single words and short phrases ("wait a minute" and when asked her sister's name, "Shannon Meyer"). The first time she heard her voice, she followed it with a big smile. Valve was left in place for at least 15 minutes but was removed when alertness seemed to be waning more, although her cuff was left deflated to allow for more access to her upper airway. Other staff could place PMV for short intervals when she is alert and attempting to communicate, and when full supervision can be provided. SLP will also continue to follow to facilitate increased use and to assess swallow function as mentation allows.  SLP Visit Diagnosis: Aphonia (R49.1)    SLP Assessment  Patient needs continued Speech Lanaguage Pathology Services    Follow Up Recommendations  Inpatient Rehab    Frequency and Duration min 3x week  2 weeks    PMSV Trial PMSV was placed for: 15 min Able to redirect subglottic air through upper airway: Yes Able to Attain Phonation: Yes Voice Quality: Other (comment)(mildly rough) Able to Expectorate Secretions: No attempts Level of  Secretion Expectoration with PMSV: Not observed Breath Support for Phonation: Mildly decreased(for brief verbalizations observed) Intelligibility: Intelligibility reduced Word: 75-100% accurate Phrase: 75-100% accurate Respirations During Trial: (low 30s) SpO2 During Trial: (mid 90s) Pulse During Trial: (120s-130s) Behavior: Other (comment)(fluctuating alertness and responses)   Tracheostomy Tube       Vent Dependency       Cuff Deflation Trial  GO Tolerated Cuff Deflation: Yes Length of Time for Cuff Deflation Trial: 15+ min Behavior: Other (comment)(lethargic)        Shannon Meyer 11/18/2018, 4:33 PM   Pollyann Glen, M.A. [place] Acute Environmental education officer [phone] Office [phone]

## 2018-11-18 NOTE — Progress Notes (Signed)
Speech Therapy at bedside using PMV with patient.

## 2018-11-18 NOTE — Progress Notes (Signed)
OT Treatment Note  Able to progress to EOB today, sitting with Shannon Meyer x 8 min. Following commands and more appropriate and interactive.  HR 126 -150 with RR up to 50s. BP stable. Prior to session , pt appeared to be able to help calm breathing by using relaxation techniques. Will benefit form extensive therapy to progress to next level of care.     11/18/18 1800  OT Visit Information  Last OT Received On 11/18/18  Assistance Needed +2  PT/OT/SLP Co-Evaluation/Treatment Yes  Reason for Co-Treatment Complexity of the patient's impairments (multi-system involvement);Necessary to address cognition/behavior during functional activity;For patient/therapist safety;To address functional/ADL transfers  OT goals addressed during session ADL's and self-care;Strengthening/ROM  History of Present Illness Pt adm 5/20 with hypoxic resp failure due to Covid 19. Pt intubated 5/20 and extubated 6/8, with re-intubation 6/8; Trach 6/11. PMH - none.   Precautions  Precautions Fall  Precaution Comments t-bar, multiple lines  Pain Assessment  Pain Assessment Faces  Faces Pain Scale 4  Pain Location generalized (?pt pointing to peri-area??)  Pain Descriptors / Indicators Discomfort;Grimacing  Pain Intervention(s) Limited activity within patient's tolerance;Relaxation;Repositioned  Cognition  Arousal/Alertness Lethargic  Behavior During Therapy Restless  Overall Cognitive Status Difficult to assess  Area of Impairment Attention;Following commands;Safety/judgement;Awareness;Problem solving  Current Attention Level Sustained  Following Commands Follows one step commands consistently;Follows one step commands with increased time  Safety/Judgement Decreased awareness of safety  Awareness Intellectual  Problem Solving Slow processing;Decreased initiation  General Comments gives "thumbs up" with either hand, raises one finger, AAROM bil UEs,   Difficult to assess due to Tracheostomy;Level of arousal (on precedex,  but eyes open throughout)  Upper Extremity Assessment  Upper Extremity Assessment Generalized weakness  RUE Deficits / Details Limited ROM at shoulders. Poor grasp strength. Increased edema.  (gross grasp/ stronger than L; unable to maintain grasp on pe)  RUE Coordination decreased fine motor;decreased gross motor  LUE Deficits / Details Limited ROM at shoulders. Poor grasp strength. Increased edema.   LUE Coordination decreased fine motor;decreased gross motor  ADL  Overall ADL's  Needs assistance/impaired  Eating/Feeding NPO  Grooming Details (indicate cue type and reason) hand over hand to wash face  Upper Body Bathing Maximal assistance;Sitting;Bed level  General ADL Comments Focus of session sitting EOB; unable to complete ADL EOB due to weakness  Bed Mobility  Overal bed mobility Needs Assistance  Bed Mobility Supine to Sit;Sit to Sidelying  Supine to sit Total assist;+2 for physical assistance;HOB elevated  Sit to sidelying Shannon assist;+2 for physical assistance;+2 for safety/equipment  General bed mobility comments HOB elevated assisted to dangle at EOB; returned to sidelying, then supine  Balance  Sitting-balance support Feet unsupported;Bilateral upper extremity supported (very little, if any support thru hands on bed)  Sitting balance-[name] Scale Poor  Sitting balance - Comments sat EOB ~8 minutes with 2 person mod-Shannon asssist (most often Shannon assist due to level of fatigue/alertness); able to raise her head on command and nearly achieve neutral head position for up to 5 seconds  Postural control Posterior lean  Vision- Assessment  Additional Comments will further assess; decreased visual attention  Transfers  General transfer comment RR and HR too high to progress  General Exercises - Upper Extremity  Shoulder Flexion AAROM;Both;10 reps;Supine  Shoulder ABduction AAROM;Both;10 reps;Supine  Elbow Flexion AAROM;Both;AROM;10 reps;Supine  Elbow Extension AROM;AAROM;Both;10  reps;Supine  Wrist Flexion AROM;AAROM;Both;10 reps  Wrist Extension AROM;AAROM;Both;10 reps  Digit Composite Flexion AROM;AAROM;Both;15 reps;Supine  Composite Extension AROM;AAROM;Both;15 reps;Supine  Other Exercises  Other Exercises PROM bil hip internal rotation in supine with pt easily moving beyond neutral  OT - End of Session  Equipment Utilized During Treatment Oxygen (4L 30% FiO2)  Activity Tolerance Patient tolerated treatment well  Patient left in bed;with call bell/phone within reach;with nursing/sitter in room;with restraints reapplied  Nurse Communication Mobility status  OT Assessment/Plan  OT Plan Discharge plan remains appropriate  OT Visit Diagnosis Unsteadiness on feet (R26.81);Other abnormalities of gait and mobility (R26.89);Muscle weakness (generalized) (M62.81);Pain;Other symptoms and signs involving cognitive function  Pain - part of body  (generalized)  Recommendations for Other Services Rehab consult  Follow Up Recommendations CIR  OT Equipment Other (comment) (TBA)  AM-PAC OT "6 Clicks" Daily Activity Outcome Measure (Version 2)  Help from another person eating meals? 1  Help from another person taking care of personal grooming? 1  Help from another person toileting, which includes using toliet, bedpan, or urinal? 1  Help from another person bathing (including washing, rinsing, drying)? 1  Help from another person to put on and taking off regular upper body clothing? 1  Help from another person to put on and taking off regular lower body clothing? 1  6 Click Score 6  OT Goal Progression  Progress towards OT goals Progressing toward goals  Acute Rehab OT Goals  Patient Stated Goal none stated  OT Goal Formulation Patient unable to participate in goal setting  Time For Goal Achievement 11/24/18  Potential to Achieve Goals Good  ADL Goals  Pt Will Perform Grooming with min assist;sitting  Pt Will Transfer to Toilet stand pivot transfer;bedside commode;with  mod assist  Pt Will Perform Toileting - Clothing Manipulation and hygiene with mod assist;sit to/from stand;sitting/lateral leans  Additional ADL Goal #1 Pt will perform bed mobility with Min Meyer in preparation for ADLs  Additional ADL Goal #2 Pt will tolerate sitting at EOB for 10 minutes with Min Guard Meyer in preparation for ADLs  Additional ADL Goal #3 Pt will demonstrate selective attention during ADL in Meyer distracting environement with Min cues  OT Time Calculation  OT Start Time (ACUTE ONLY) 0855  OT Stop Time (ACUTE ONLY) 0930  OT Time Calculation (min) 35 min  OT General Charges  $OT Visit 1 Visit  OT Treatments  $Self Care/Home Management  8-22 mins  Luisa DagoHilary Clair [place], OT/L   Acute OT Clinical Specialist Acute Rehabilitation Services Pager [phone] Office [phone]

## 2018-11-18 NOTE — Progress Notes (Signed)
Nutrition Follow-up  DOCUMENTATION CODES:   Obesity unspecified  INTERVENTION:    To better meet re-estimated needs, change TF to Osmolite 1.2 at 50 ml/h    Pro-stat 30 ml TID   Provides 1740 kcal, 112 gm protein, 984 ml free water daily  NUTRITION DIAGNOSIS:   Inadequate oral intake related to inability to eat as evidenced by NPO status.  Ongoing  GOAL:   Provide needs based on ASPEN/SCCM guidelines  Met with TF  MONITOR:   Vent status, TF tolerance, Labs, Skin  ASSESSMENT:   53 yo female with no significant PMH who was admitted with COVID-19.  S/P tracheostomy on 6/12. Currently on trach collar. Plans for SLP evaluation today.  Cortrak in place, tip is post-pyloric. Currently receiving Vital High Protein at 40 ml/h with Pro-stat 60 ml daily. Patient tolerating TF without difficulty.  Labs reviewed. BUN 22 (H) CBG's: 116-112-120-133 Medications reviewed and include Colace, Lasix, MVI, KCl.  Weight down to 88.6 kg today I/O net negative 5.2 L  Diet Order:   Diet Order            Diet NPO time specified  Diet effective midnight              EDUCATION NEEDS:   No education needs have been identified at this time  Skin:  Skin Assessment: Skin Integrity Issues: Skin Integrity Issues:: Stage II Stage II: perineum  Last BM:  6/15 (type 7)  Height:   Ht Readings from Last 1 Encounters:  10/22/18 '5\' 4"'  (1.626 m)    Weight:   Wt Readings from Last 1 Encounters:  11/18/18 88.6 kg    Ideal Body Weight:  54.5 kg  BMI:  Body mass index is 33.53 kg/m.  Estimated Nutritional Needs:   Kcal:  1600-1800  Protein:  109 gm  Fluid:  >/= 1.8 L    Molli Barrows, RD, LDN, [place] Pager [phone] After Hours Pager [phone]

## 2018-11-18 NOTE — Progress Notes (Addendum)
Spoke with patient's daughter, [name] and gave her any new updates. Encouraged daughter to video conference with patient. She stated that they had video conference yesterday and it was very emotional for she and her mom. Still encouraged daughter to continue with the video calls. States the entire family may send her a video.

## 2018-11-18 NOTE — Progress Notes (Signed)
Family called and updated. All questions are answered

## 2018-11-19 ENCOUNTER — Inpatient Hospital Stay (HOSPITAL_COMMUNITY): Payer: HRSA Program

## 2018-11-19 DIAGNOSIS — R509 Fever, unspecified: Secondary | ICD-10-CM

## 2018-11-19 DIAGNOSIS — Z930 Tracheostomy status: Secondary | ICD-10-CM

## 2018-11-19 LAB — CBC WITH DIFFERENTIAL/PLATELET
Abs Immature Granulocytes: 0.09 10*3/uL — ABNORMAL HIGH (ref 0.00–0.07)
Basophils Absolute: 0.1 10*3/uL (ref 0.0–0.1)
Basophils Relative: 0 %
Eosinophils Absolute: 0.7 10*3/uL — ABNORMAL HIGH (ref 0.0–0.5)
Eosinophils Relative: 4 %
HCT: 39.7 % (ref 36.0–46.0)
Hemoglobin: 11.7 g/dL — ABNORMAL LOW (ref 12.0–15.0)
Immature Granulocytes: 1 %
Lymphocytes Relative: 12 %
Lymphs Abs: 2.2 10*3/uL (ref 0.7–4.0)
MCH: 28.7 pg (ref 26.0–34.0)
MCHC: 29.5 g/dL — ABNORMAL LOW (ref 30.0–36.0)
MCV: 97.3 fL (ref 80.0–100.0)
Monocytes Absolute: 1.4 10*3/uL — ABNORMAL HIGH (ref 0.1–1.0)
Monocytes Relative: 8 %
Neutro Abs: 14.3 10*3/uL — ABNORMAL HIGH (ref 1.7–7.7)
Neutrophils Relative %: 75 %
Platelets: 260 10*3/uL (ref 150–400)
RBC: 4.08 MIL/uL (ref 3.87–5.11)
RDW: 18.4 % — ABNORMAL HIGH (ref 11.5–15.5)
WBC: 18.8 10*3/uL — ABNORMAL HIGH (ref 4.0–10.5)
nRBC: 0 % (ref 0.0–0.2)

## 2018-11-19 LAB — COMPREHENSIVE METABOLIC PANEL
ALT: 43 U/L (ref 0–44)
AST: 42 U/L — ABNORMAL HIGH (ref 15–41)
Albumin: 2.9 g/dL — ABNORMAL LOW (ref 3.5–5.0)
Alkaline Phosphatase: 152 U/L — ABNORMAL HIGH (ref 38–126)
Anion gap: 9 (ref 5–15)
BUN: 23 mg/dL — ABNORMAL HIGH (ref 6–20)
CO2: 31 mmol/L (ref 22–32)
Calcium: 9.1 mg/dL (ref 8.9–10.3)
Chloride: 100 mmol/L (ref 98–111)
Creatinine, Ser: 0.51 mg/dL (ref 0.44–1.00)
GFR calc Af Amer: >60 mL/min (ref >60)
GFR calc non Af Amer: >60 mL/min (ref >60)
Glucose, Bld: 158 mg/dL — ABNORMAL HIGH (ref 70–99)
Potassium: 4.4 mmol/L (ref 3.5–5.1)
Sodium: 140 mmol/L (ref 135–145)
Total Bilirubin: 0.4 mg/dL (ref 0.3–1.2)
Total Protein: 6.1 g/dL — ABNORMAL LOW (ref 6.5–8.1)

## 2018-11-19 LAB — C-REACTIVE PROTEIN: CRP: 3.4 mg/dL — ABNORMAL HIGH (ref <1.0)

## 2018-11-19 LAB — GLUCOSE, CAPILLARY
Glucose-Capillary: 115 mg/dL — ABNORMAL HIGH (ref 70–99)
Glucose-Capillary: 118 mg/dL — ABNORMAL HIGH (ref 70–99)
Glucose-Capillary: 148 mg/dL — ABNORMAL HIGH (ref 70–99)
Glucose-Capillary: 108 mg/dL — ABNORMAL HIGH (ref 70–99)
Glucose-Capillary: 122 mg/dL — ABNORMAL HIGH (ref 70–99)

## 2018-11-19 LAB — TSH: TSH: 1.034 u[IU]/mL (ref 0.350–4.500)

## 2018-11-19 MED ORDER — HEPARIN SODIUM (PORCINE) 10000 UNIT/ML IJ SOLN
7500.0000 [IU] | Freq: Three times a day (TID) | INTRAMUSCULAR | Status: DC
  Administered 2018-11-19 – 2018-12-09 (×57): 7500 [IU] via SUBCUTANEOUS
  Filled 2018-11-19 (×57): qty 1

## 2018-11-19 MED ORDER — OXYCODONE HCL 5 MG PO TABS
10.0000 mg | ORAL_TABLET | Freq: Four times a day (QID) | ORAL | Status: DC
  Administered 2018-11-19 – 2018-11-21 (×7): 10 mg
  Filled 2018-11-19 (×7): qty 2

## 2018-11-19 MED ORDER — QUETIAPINE FUMARATE 50 MG PO TABS
100.0000 mg | ORAL_TABLET | Freq: Every day | ORAL | Status: DC
  Filled 2018-11-19: qty 2

## 2018-11-19 MED ORDER — VANCOMYCIN HCL IN DEXTROSE 1-5 GM/200ML-% IV SOLN
1000.0000 mg | Freq: Two times a day (BID) | INTRAVENOUS | Status: DC
  Administered 2018-11-20: 1000 mg via INTRAVENOUS
  Filled 2018-11-19 (×2): qty 200

## 2018-11-19 MED ORDER — VANCOMYCIN HCL 10 G IV SOLR
1500.0000 mg | Freq: Once | INTRAVENOUS | Status: AC
  Administered 2018-11-19: 16:00:00 1500 mg via INTRAVENOUS
  Filled 2018-11-19: qty 1000

## 2018-11-19 MED ORDER — CLONAZEPAM 0.5 MG PO TBDP
1.0000 mg | ORAL_TABLET | Freq: Every day | ORAL | Status: DC
  Filled 2018-11-19: qty 2

## 2018-11-19 MED ORDER — PIPERACILLIN-TAZOBACTAM 3.375 G IVPB
3.3750 g | Freq: Three times a day (TID) | INTRAVENOUS | Status: AC
  Administered 2018-11-19 – 2018-11-21 (×7): 3.375 g via INTRAVENOUS
  Filled 2018-11-19 (×7): qty 50

## 2018-11-19 NOTE — Progress Notes (Signed)
Spoke with patient's daughter, [name] and provided full update and answered all questions.

## 2018-11-19 NOTE — Progress Notes (Addendum)
Occupational Therapy Treatment Patient Details Name: Shannon BradfordDeborah Meyer MRN: [id number] DOB: 01/19/1966 Today's Date: 11/19/2018    History of present illness Pt adm 5/20 with hypoxic resp failure due to Covid 19. Pt intubated 5/20 and extubated 6/8, with re-intubation 6/8; Trach 6/11. PMH - none. 6/17 foul smelling trach site/secretions, elevated WBC and temp; vomited tube feeds..   OT comments  Pt seen at bed level. Pt had pain meds earlier and inconsistently following commands. Attempted to engage pt by playing music/using videos on phone. Pt briefly sustaining attention to phone video. Completed BU/LE AA/PROM. Pt scratching head with L hand. Pt seen on TC; 10L; RR into 40s and HR 130s during ROM with desat to 88. End of session, HR 120; SpO2 91; RR 33. Will continue to follow.   Follow Up Recommendations  Supervision/Assistance - 24 hour;CIR    Equipment Recommendations  Other (comment)(TBA)    Recommendations for Other Services Rehab consult    Precautions / Restrictions Precautions Precautions: Fall Precaution Comments: t-bar, multiple lines; coretrack       Mobility Bed Mobility Overal bed mobility: Needs Assistance             General bed mobility comments: Total A +2 with rolling  Transfers                 General transfer comment: NA    Balance                                           ADL either performed or assessed with clinical judgement   ADL                                         General ADL Comments: total A this session; affected by lethargy/medicaiton     Vision       Perception     Praxis      Cognition Arousal/Alertness: Lethargic Behavior During Therapy: Restless Overall Cognitive Status: Impaired/Different from baseline Area of Impairment: Attention;Following commands;Awareness                   Current Attention Level: Focused   Following Commands: (not following commands)    Awareness: Intellectual            Exercises General Exercises - Upper Extremity Shoulder Flexion: AAROM;PROM;Both;10 reps;Supine Shoulder ABduction: PROM;AAROM;Both;10 reps;Supine Elbow Flexion: AAROM;PROM;Both;10 reps;Seated;Supine Elbow Extension: PROM;AROM;Both;10 reps;Seated;Supine Wrist Flexion: PROM;AAROM;Both;10 reps;Supine;Seated Wrist Extension: PROM;AAROM;Both;10 reps;Supine;Seated Digit Composite Flexion: PROM;Both;10 reps;Seated;Supine Composite Extension: PROM;AAROM;Both;10 reps;Supine General Exercises - Lower Extremity Ankle Circles/Pumps: PROM;Both;10 reps;Supine Heel Slides: PROM;Both;10 reps;Supine Hip ABduction/ADduction: PROM;Both;10 reps;Supine Other Exercises Other Exercises: B hip ER/IR as allowed Other Exercises: B heels elevated on pillows   Shoulder Instructions       General Comments      Pertinent Vitals/ Pain       Pain Assessment: Faces Faces Pain Scale: Hurts little more Pain Location: generalized Pain Descriptors / Indicators: Discomfort;Grimacing Pain Intervention(s): Limited activity within patient's tolerance  Home Living                                          Prior Functioning/Environment  Frequency  Min 3X/week        Progress Toward Goals  OT Goals(current goals can now be found in the care plan section)  Progress towards OT goals: Not progressing toward goals - comment(lethargy limiting ability to participate)  Acute Rehab OT Goals Patient Stated Goal: none stated OT Goal Formulation: Patient unable to participate in goal setting Time For Goal Achievement: 11/24/18 Potential to Achieve Goals: Good ADL Goals Pt Will Perform Grooming: with min assist;sitting Pt Will Transfer to Toilet: stand pivot transfer;bedside commode;with mod assist Pt Will Perform Toileting - Clothing Manipulation and hygiene: with mod assist;sit to/from stand;sitting/lateral leans Additional ADL Goal #1: Pt  will perform bed mobility with Min A in preparation for ADLs Additional ADL Goal #2: Pt will tolerate sitting at EOB for 10 minutes with Min Guard A in preparation for ADLs Additional ADL Goal #3: Pt will demonstrate selective attention during ADL in a distracting environement with Min cues  Plan Discharge plan remains appropriate    Co-evaluation                 AM-PAC OT "6 Clicks" Daily Activity     Outcome Measure   Help from another person eating meals?: Total Help from another person taking care of personal grooming?: Total Help from another person toileting, which includes using toliet, bedpan, or urinal?: Total Help from another person bathing (including washing, rinsing, drying)?: Total Help from another person to put on and taking off regular upper body clothing?: Total Help from another person to put on and taking off regular lower body clothing?: Total 6 Click Score: 6    End of Session Equipment Utilized During Treatment: Oxygen(10L)  OT Visit Diagnosis: Unsteadiness on feet (R26.81);Other abnormalities of gait and mobility (R26.89);Muscle weakness (generalized) (M62.81);Pain;Other symptoms and signs involving cognitive function Pain - part of body: (generalized discomfort)   Activity Tolerance Patient limited by lethargy   Patient Left in bed;with call bell/phone within reach;with nursing/sitter in room   Nurse Communication Other (comment)(positioning)        Time: 1550-1610 OT Time Calculation (min): 20 min  Charges: OT General Charges $OT Visit: 1 Visit OT Treatments $Therapeutic Exercise: 8-22 mins  Maurie Boettcher, OT/L   Acute OT Clinical Specialist [place] Pager [phone] Office [phone]    [facility] 11/19/2018, 4:30 PM

## 2018-11-19 NOTE — Progress Notes (Signed)
Spoke with Madaline Savage, patient's sister and updated her on patient and her care.

## 2018-11-19 NOTE — Progress Notes (Addendum)
PROGRESS NOTE  Shannon Meyer  YBO:[id number] DOB: 03-05-1966 DOA: 10/22/2018 PCP: No primary care provider on file.   Brief Narrative: Shannon Meyer is a 53 y.o. female with no significant medical history who presented with cough, weakness and shortness of breath found to have covid-19 with bilateral CXR opacities, intubated in ED and admitted to [facility] 5/20.   Assessment & Plan: Principal Problem:   COVID-19 Active Problems:   Leukocytosis   Sepsis ([place])   Elevated LFTs   QT prolongation   Acute respiratory failure (HCC)   Pressure injury of skin   Tracheostomy status (HCC)  Acute hypoxic respiratory failure due to covid-19 pneumonia: Failed extubation 6/8. - Completed remdesivir 5/21 - 5/30, received actemra 5/20, and 5/21. Received CTX 5/20-5/26, azithromycin 5/20-5/24. -Since staff is reporting foul-smelling sputum and she is having mild fevers, respiratory culture has been sent and repeat chest x-ray ordered.  After reviewing chest x-ray, he does note that she has worsening bilateral patchy interstitial disease.  Due to concern for HCAP, she will be started on vancomycin and Zosyn. - Maintain euvolemia/net negative.  - Avoid NSAIDs  - Continue trach collar.  - Diurese as able.  ICU delirium:  -Patient was previously on Precedex which has since been weaned off.  She was started on twice daily clonazepam, twice daily Seroquel, scheduled oxycodone.  She is extremely somnolent today, but is reported to be agitated overnight.  Will discontinue morning dose of clonazepam and Seroquel and continue nightly dose.  Since she is requiring significant doses of PRN Dilaudid, will will increase her scheduled oxycodone dose  Vomiting Etiology unclear.  Check abdominal x-ray.  Holding tube feeds for now.  Sinus tachycardia: 130's - 160's.  Appears to have been persistent through her hospital stay.  Pulmonary embolus has been ruled out by CTA of the chest.  She is mildly febrile which could  certainly be contributing. - Will give metoprolol and continue monitoring.  - TSH in normal range  Hypernatremia: Resolved. - Continue free water  Hypokalemia:  - Supplement and monitor  Positive blood cultures: 1 of 4 bottles, suspect CoNS contaminant.   Obesity: BMI 34.  - Optimize nutritional status  DVT prophylaxis: Lovenox Code Status: Full Family Communication: updated daughter on 6/17 Disposition Plan: Remain in ICU. Therapy recommending CIR eventual disposition.   Consultants:   PCCM  Procedures:   5/20 ETT  5/24 R PICC line  6/11 Tracheostomy  Antimicrobials:  Azithromycin 5/20 >> 5/24  Rocephin 5/20 >> 5/26  Remdesivir 5/25 >> 5/30  Actemra 5/20 and 5/21  Diflucan 5/30 >> 6/1   Subjective: Patient currently on trach collar.  Staff has noted foul-smelling secretions being suctioned from her trach.  She is not been having any diarrhea.  She had an episode of vomiting earlier today and tube feeds have been held.  Objective: Vitals:   11/19/18 1200 11/19/18 1245 11/19/18 1300 11/19/18 1330  BP:  (!) 151/71 (!) 133/117 (!) 111/97  Pulse: (!) 135 (!) 122 (!) 121 (!) 122  Resp: (!) 30 (!) 38 (!) 34 (!) 29  Temp:      TempSrc:      SpO2: (!) 84% 94% 98% 98%  Weight:      Height:        Intake/Output Summary (Last 24 hours) at 11/19/2018 1610 Last data filed at 11/19/2018 1400 Gross per 24 hour  Intake 910.76 ml  Output 1000 ml  Net -89.24 ml   Filed Weights   11/17/18 0421 11/18/18  0500 11/19/18 0500  Weight: 93 kg 88.6 kg 88.6 kg   General exam: somnolent, opens eyes to voice but does not answer questions Respiratory system: Clear to auscultation. Respiratory effort increased. Cardiovascular system:regular rhythm, tachycardic. No murmurs, rubs, gallops. Gastrointestinal system: Abdomen is nondistended, soft and nontender. No organomegaly or masses felt. Normal bowel sounds heard. Central nervous system: somnolent. No focal neurological  deficits. Extremities: No C/C/E, +pedal pulses Skin: No rashes, lesions or ulcers Psychiatry: very somnolent, does not answer questions or follow commands   Data Reviewed: I have personally reviewed following labs and imaging studies  CBC: Recent Labs  Lab 11/14/18 0220 11/15/18 0142 11/16/18 0330 11/18/18 0745 11/19/18 0200  WBC 9.0 8.7 14.7* 14.7* 18.8*  NEUTROABS  --   --   --  10.0* 14.3*  HGB 11.3* 10.2* 12.0 11.6* 11.7*  HCT 36.1 33.9* 40.3 37.4 39.7  MCV 94.5 94.7 95.7 96.6 97.3  PLT 126* 127* 180 231 260   Basic Metabolic Panel: Recent Labs  Lab 11/14/18 0220 11/15/18 0142 11/16/18 0330 11/18/18 0745 11/19/18 0200  NA 140 142 143 141 140  K 3.4* 3.4* 3.6 4.4 4.4  CL 108 108 107 100 100  CO2 24 26 28 30 31   GLUCOSE 112* 136* 122* 134* 158*  BUN 27* 24* 18 22* 23*  CREATININE 0.51 0.47 0.46 0.48 0.51  CALCIUM 8.2* 8.5* 8.9 9.0 9.1   GFR: Estimated Creatinine Clearance: 85.7 mL/min (by C-G formula based on SCr of 0.51 mg/dL). Liver Function Tests: Recent Labs  Lab 11/18/18 0745 11/19/18 0200  AST 39 42*  ALT 43 43  ALKPHOS 158* 152*  BILITOT 0.4 0.4  PROT 6.4* 6.1*  ALBUMIN 3.1* 2.9*   No results for input(s): LIPASE, AMYLASE in the last 168 hours. No results for input(s): AMMONIA in the last 168 hours. Coagulation Profile: Recent Labs  Lab 11/13/18 0854  INR 0.9   Cardiac Enzymes: No results for input(s): CKTOTAL, CKMB, CKMBINDEX, TROPONINI in the last 168 hours. BNP (last 3 results) No results for input(s): PROBNP in the last 8760 hours. HbA1C: No results for input(s): HGBA1C in the last 72 hours. CBG: Recent Labs  Lab 11/18/18 2327 11/19/18 0337 11/19/18 0746 11/19/18 1122 11/19/18 1519  GLUCAP 134* 115* 118* 148* 108*   Lipid Profile: No results for input(s): CHOL, HDL, LDLCALC, TRIG, CHOLHDL, LDLDIRECT in the last 72 hours. Thyroid Function Tests: Recent Labs    11/19/18 0200  TSH 1.034   Anemia Panel: No results for  input(s): VITAMINB12, FOLATE, FERRITIN, TIBC, IRON, RETICCTPCT in the last 72 hours. Urine analysis:    Component Value Date/Time   COLORURINE STRAW (A) 11/01/2018 0901   APPEARANCEUR HAZY (A) 11/01/2018 0901   LABSPEC 1.010 11/01/2018 0901   PHURINE 5.0 11/01/2018 0901   GLUCOSEU NEGATIVE 11/01/2018 0901   HGBUR NEGATIVE 11/01/2018 0901   BILIRUBINUR NEGATIVE 11/01/2018 0901   KETONESUR NEGATIVE 11/01/2018 0901   PROTEINUR NEGATIVE 11/01/2018 0901   NITRITE NEGATIVE 11/01/2018 0901   LEUKOCYTESUR NEGATIVE 11/01/2018 0901   Recent Results (from the past 240 hour(s))  Culture, respiratory (non-expectorated)     Status: None (Preliminary result)   Collection Time: 11/19/18 10:59 AM   Specimen: Tracheal Aspirate; Respiratory  Result Value Ref Range Status   Specimen Description   Final    TRACHEAL ASPIRATE Performed at [facility], [address] [place], [place] [address]    Special Requests   Final    NONE Performed at [facility], [address] [place], [place] [address]    Gram Stain   Final    MODERATE WBC PRESENT,BOTH PMN AND MONONUCLEAR FEW GRAM NEGATIVE RODS FEW GRAM POSITIVE COCCI RARE GRAM VARIABLE ROD RARE YEAST Performed at [facility], [address] [place], [place] [address]    Culture PENDING  Incomplete   Report Status PENDING  Incomplete      Radiology Studies: Dg Chest Port 1 View  Result Date: 11/19/2018 CLINICAL DATA:  Fever EXAM: PORTABLE CHEST 1 VIEW COMPARISON:  11/13/2018, 11/11/2018 FINDINGS: Tracheostomy tube in satisfactory position. Feeding tube coiled in the stomach. Right-sided PICC line with the tip projecting over the SVC. Worsening bilateral patchy interstitial and alveolar airspace disease throughout bilateral lungs concerning for multilobar pneumonia. No pleural effusion or pneumothorax. Stable cardiomediastinal silhouette. No aggressive osseous lesion. IMPRESSION: 1. Support lines and  tubing in satisfactory position. 2. Worsening bilateral patchy interstitial and alveolar airspace disease throughout bilateral lungs concerning for multilobar pneumonia. Electronically Signed   By: Elige KoHetal  Patel   On: 11/19/2018 13:38    Scheduled Meds:  chlorhexidine  15 mL Mouth/Throat BID   Chlorhexidine Gluconate Cloth  6 each Topical Daily   [START ON 11/20/2018] clonazepam  1 mg Per Tube QHS   docusate  100 mg Per Tube BID   famotidine  20 mg Per Tube Q12H   feeding supplement (PRO-STAT SUGAR FREE 64)  30 mL Per Tube TID   furosemide  40 mg Per Tube Daily   heparin injection (subcutaneous)  7,500 Units Subcutaneous Q8H   insulin aspart  0-9 Units Subcutaneous Q4H   mouth rinse  15 mL Mouth Rinse 10 times per day   metoprolol tartrate  25 mg Per Tube BID   multivitamin  15 mL Per Tube Daily   oxyCODONE  10 mg Per Tube Q6H   potassium chloride  40 mEq Per Tube BID   [START ON 11/20/2018] QUEtiapine  100 mg Per Tube QHS   sodium chloride flush  10-40 mL Intracatheter Q12H   sodium chloride flush  3 mL Intravenous Q12H   vecuronium  10 mg Intravenous Once   Continuous Infusions:  sodium chloride 10 mL/hr at 11/19/18 1400   feeding supplement (OSMOLITE 1.2 CAL) Stopped (11/19/18 1215)   piperacillin-tazobactam (ZOSYN)  IV     vancomycin 1,500 mg (11/19/18 1544)   [START ON 11/20/2018] vancomycin       LOS: 28 days   Critical care time spent: 35 minutes.  Erick BlinksJehanzeb Hall Birchard, MD [facility] [email or web address] Password [email or web address] 11/19/2018, 4:10 PM

## 2018-11-19 NOTE — Progress Notes (Signed)
NAME:  Shannon BradfordDeborah Vogan, MRN:  [id number], DOB:  11/22/1965, LOS: 28 ADMISSION DATE:  10/22/2018, CONSULTATION DATE:  10/22/2018 REFERRING MD:  Pilar PlateBero, CHIEF COMPLAINT:  Dyspnea   Brief History   53 y/o female with no PMH admitted [facility] Oct [facility], 2018 due to ARDS due to COVID 19 pneumonia.  Past Medical History  None  Significant Hospital Events   5/20 Intubated in ED and [facility] ICU 5/21-5/26 Weaning FIO2 and PEEP, diuresing 5/26-6/3 Tolerating weaning for 1-2 hours 6/7 weaning on pressure support ventilation again this morning 10/5 6/8 extubated, reintubated (tachypnea, tachycardia) 6/12 Trached 6/13 Trach collar all day  6/14 bleeding from tracheostomy site 6/17 48 hours off vent, fever  Consults:  Pulmonary and critical care medicine  Procedures:  5/20 ET tube>6/11  Trach 6/11 >> 6/7 PICC line right arm>  Significant Diagnostic Tests:  Oct 28, 2018 CT angiogram chest no pulmonary embolism, bilateral atypical pneumonia consistent with COVID-19  Micro Data:  SARS-CoV-2 May 20+ Blood culture May 20 1 out of 2 [facility] staph, coag negative  Antimicrobials:  Zithromax 5/20 >>5/24 Rocephin 5/20 >>5/26 Remdesivir 5/21 >5/30 Actemra 5/20>5/21 Solumedrol 5/12  > 5/26   Interim history/subjective:   Remains off of mechanical ventilation No more oozing around tracheostomy site Foul smelling respiratory secretions WBC up drowsy  Objective   Blood pressure (!) 126/114, pulse (!) 122, temperature 99 F (37.2 C), temperature source Oral, resp. rate (!) 36, height 5\' 4"  (1.626 m), weight 88.6 kg, SpO2 94 %.    FiO2 (%):  [30 %-35 %] 35 %   Intake/Output Summary (Last 24 hours) at 11/19/2018 0804 Last data filed at 11/19/2018 0530 Gross per 24 hour  Intake 837.26 ml  Output 600 ml  Net 237.26 ml   Filed Weights   11/17/18 0421 11/18/18 0500 11/19/18 0500  Weight: 93 kg 88.6 kg 88.6 kg    Examination:  General:  Resting comfortably  in bed HENT: NCAT trach site with no bleeding, clean PULM: CTA B, tachypnea but no accessory muscle use CV: RRR, no mgr GI: BS+, soft, nontender MSK: normal bulk and tone Neuro: drowsy but will wake up, not following commands today  6/17 CXR > worsening infiltrates bilatearlly  Resolved Hospital Problem list     Assessment & Plan:  ARDS due to COVID-19 pneumonia Continue to maintain off ventilator today Monitor respiratory status closely Monitor O2 saturation, target greater than 88% Tracheostomy care per routine Mobilize as able  Delirium/ICU encephalopathy: Appears to be worse today, new infection? Monitor off of Precedex Wean down oxycodone, clonazepam and quetiapine, agree with changing to nightly dosing only  Possible HCAP: worsening infiltrates, WBC up, increased secretions Respiratory culture Will discuss starting antibiotics with attending  Severe physical deconditioning: Physical therapy consult  Best practice:  Diet: Tube feeding Pain/Anxiety/Delirium protocol (if indicated): RASS goal 0 VAP protocol (if indicated): Yes DVT prophylaxis: SCDs GI prophylaxis: Famotidine Glucose control: SSI Mobility: PT consult Code Status: Full Family Communication: will contact today Disposition: remain in ICU  Labs   CBC: Recent Labs  Lab 11/14/18 0220 11/15/18 0142 11/16/18 0330 11/18/18 0745 11/19/18 0200  WBC 9.0 8.7 14.7* 14.7* 18.8*  NEUTROABS  --   --   --  10.0* 14.3*  HGB 11.3* 10.2* 12.0 11.6* 11.7*  HCT 36.1 33.9* 40.3 37.4 39.7  MCV 94.5 94.7 95.7 96.6 97.3  PLT 126* 127* 180 231 260    Basic Metabolic Panel: Recent Labs  Lab 11/14/18  8563 11/15/18 0142 11/16/18 0330 11/18/18 0745 11/19/18 0200  NA 140 142 143 141 140  K 3.4* 3.4* 3.6 4.4 4.4  CL 108 108 107 100 100  CO2 24 26 28 30 31   GLUCOSE 112* 136* 122* 134* 158*  BUN 27* 24* 18 22* 23*  CREATININE 0.51 0.47 0.46 0.48 0.51  CALCIUM 8.2* 8.5* 8.9 9.0 9.1   GFR: Estimated  Creatinine Clearance: 85.7 mL/min (by C-G formula based on SCr of 0.51 mg/dL). Recent Labs  Lab 11/15/18 0142 11/16/18 0330 11/18/18 0745 11/19/18 0200  WBC 8.7 14.7* 14.7* 18.8*    Liver Function Tests: Recent Labs  Lab 11/18/18 0745 11/19/18 0200  AST 39 42*  ALT 43 43  ALKPHOS 158* 152*  BILITOT 0.4 0.4  PROT 6.4* 6.1*  ALBUMIN 3.1* 2.9*   No results for input(s): LIPASE, AMYLASE in the last 168 hours. No results for input(s): AMMONIA in the last 168 hours.  ABG    Component Value Date/Time   PHART 7.432 11/10/2018 1722   PCO2ART 36.0 11/10/2018 1722   PO2ART 77.0 (L) 11/10/2018 1722   HCO3 23.9 11/10/2018 1722   TCO2 25 11/10/2018 1722   ACIDBASEDEF 1.0 10/24/2018 0419   O2SAT 95.0 11/10/2018 1722     Coagulation Profile: Recent Labs  Lab 11/13/18 0854  INR 0.9    Cardiac Enzymes: No results for input(s): CKTOTAL, CKMB, CKMBINDEX, TROPONINI in the last 168 hours.  HbA1C: No results found for: HGBA1C  CBG: Recent Labs  Lab 11/18/18 1618 11/18/18 1932 11/18/18 2327 11/19/18 0337 11/19/18 0746  GLUCAP 103* 117* 134* 115* 118*    Critical care time: 35 minutes    Roselie Awkward, MD [place] [facility] Pager: [phone] Cell: [phone] If no response, call [phone]

## 2018-11-19 NOTE — Progress Notes (Signed)
Patient became more restless and vomited large amount of tube feeds. RT at bedside to inflate trach cuff. Tube feeds turned off. Patient nodded head "yes" when asked if she was feeling nauseas. Gave PRN zofran and patient continues to be restless so gave PRN dilaudid as well. Dr. Roderic Palau notified. Patient bathed and is currently resting at this time. Will continue to monitor.

## 2018-11-19 NOTE — Progress Notes (Signed)
Spoke with [name] again to give updates. No changes since I spoke with her last. Answered all questions

## 2018-11-19 NOTE — Progress Notes (Signed)
Pharmacy Antibiotic Note  Shannon Meyer is a 53 y.o. female admitted on 10/22/2018 with COVID-19 pneumonia.  Today she had foul smelling trach site secretions, elevated WBC and temp, and later developed tube feed vomiting.  Pharmacy has been consulted for Vancomycin and Zosyn dosing.  Plan:  Zosyn 3.375g IV Q8H infused over 4hrs.  Vancomycin 1500mg  IV x1 then 1g IV q12h.  Goal AUC = 400 - 550.  Est AUC 481 using SCr 0.51  Follow up renal function, culture results, and clinical course.    Height: 5\' 4"  (162.6 cm) Weight: 195 lb 5.2 oz (88.6 kg) IBW/kg (Calculated) : 54.7  Temp (24hrs), Avg:99.2 F (37.3 C), Min:98.3 F (36.8 C), Max:100.1 F (37.8 C)  Recent Labs  Lab 11/14/18 0220 11/15/18 0142 11/16/18 0330 11/18/18 0745 11/19/18 0200  WBC 9.0 8.7 14.7* 14.7* 18.8*  CREATININE 0.51 0.47 0.46 0.48 0.51    Estimated Creatinine Clearance: 85.7 mL/min (by C-G formula based on SCr of 0.51 mg/dL).    No Known Allergies  Antimicrobials this admission: 5/20 Azith >> 5/24 5/20 CTX >> 5/26 5/20 Vit C/ Zinc >>6/1 5/20 Actemra 5/21 Actemra 5/21 Remdesivir >> 5/30 5/30 Fluconazole >> 6/1 6/17 Zosyn >>  6/17 Vanc >>   Microbiology results: 5/20 SARS Coronavirus 2:  positive 5/20 BCx: 1/2 bottles CNS      5/22 BCID:  coag neg staph species. 5/23 MRSA PCR: neg 6/17 TA:  few GNR, few GPR, rare yeast  Thank you for allowing pharmacy to be a part of this patient's care.  Gretta Arab PharmD, BCPS Clinical Pharmacist Clinical pharmacist phone 7am- 5pm: [phone] 11/19/2018 3:18 PM

## 2018-11-20 ENCOUNTER — Inpatient Hospital Stay (HOSPITAL_COMMUNITY): Payer: HRSA Program

## 2018-11-20 LAB — COMPREHENSIVE METABOLIC PANEL
ALT: 63 U/L — ABNORMAL HIGH (ref 0–44)
AST: 77 U/L — ABNORMAL HIGH (ref 15–41)
Albumin: 2.7 g/dL — ABNORMAL LOW (ref 3.5–5.0)
Alkaline Phosphatase: 172 U/L — ABNORMAL HIGH (ref 38–126)
Anion gap: 6 (ref 5–15)
BUN: 29 mg/dL — ABNORMAL HIGH (ref 6–20)
CO2: 34 mmol/L — ABNORMAL HIGH (ref 22–32)
Calcium: 9 mg/dL (ref 8.9–10.3)
Chloride: 103 mmol/L (ref 98–111)
Creatinine, Ser: 0.59 mg/dL (ref 0.44–1.00)
GFR calc Af Amer: >60 mL/min (ref >60)
GFR calc non Af Amer: >60 mL/min (ref >60)
Glucose, Bld: 65 mg/dL — ABNORMAL LOW (ref 70–99)
Potassium: 4.3 mmol/L (ref 3.5–5.1)
Sodium: 143 mmol/L (ref 135–145)
Total Bilirubin: 0.3 mg/dL (ref 0.3–1.2)
Total Protein: 6 g/dL — ABNORMAL LOW (ref 6.5–8.1)

## 2018-11-20 LAB — CBC
HCT: 37.1 % (ref 36.0–46.0)
Hemoglobin: 10.8 g/dL — ABNORMAL LOW (ref 12.0–15.0)
MCH: 28.7 pg (ref 26.0–34.0)
MCHC: 29.1 g/dL — ABNORMAL LOW (ref 30.0–36.0)
MCV: 98.7 fL (ref 80.0–100.0)
Platelets: 272 10*3/uL (ref 150–400)
RBC: 3.76 MIL/uL — ABNORMAL LOW (ref 3.87–5.11)
RDW: 18.6 % — ABNORMAL HIGH (ref 11.5–15.5)
WBC: 17 10*3/uL — ABNORMAL HIGH (ref 4.0–10.5)
nRBC: 0 % (ref 0.0–0.2)

## 2018-11-20 LAB — GLUCOSE, CAPILLARY
Glucose-Capillary: 101 mg/dL — ABNORMAL HIGH (ref 70–99)
Glucose-Capillary: 107 mg/dL — ABNORMAL HIGH (ref 70–99)
Glucose-Capillary: 126 mg/dL — ABNORMAL HIGH (ref 70–99)
Glucose-Capillary: 75 mg/dL (ref 70–99)
Glucose-Capillary: 94 mg/dL (ref 70–99)
Glucose-Capillary: 98 mg/dL (ref 70–99)

## 2018-11-20 MED ORDER — LINACLOTIDE 145 MCG PO CAPS
290.0000 ug | ORAL_CAPSULE | Freq: Every day | ORAL | Status: DC
  Administered 2018-11-20: 290 ug
  Filled 2018-11-20 (×2): qty 2

## 2018-11-20 MED ORDER — ACETAMINOPHEN 650 MG RE SUPP: 650.0000 mg | Freq: Four times a day (QID) | RECTAL | Status: DC | PRN

## 2018-11-20 MED ORDER — NALOXEGOL OXALATE 25 MG PO TABS
25.0000 mg | ORAL_TABLET | Freq: Every day | ORAL | Status: DC
  Filled 2018-11-20 (×3): qty 1

## 2018-11-20 MED ORDER — FAMOTIDINE IN NACL 20-0.9 MG/50ML-% IV SOLN
20.0000 mg | Freq: Two times a day (BID) | INTRAVENOUS | Status: DC
  Administered 2018-11-20 – 2018-11-21 (×2): 20 mg via INTRAVENOUS
  Filled 2018-11-20 (×2): qty 50

## 2018-11-20 MED ORDER — VITAL AF 1.2 CAL PO LIQD
1000.0000 mL | ORAL | Status: DC
  Administered 2018-11-21 – 2018-11-27 (×6): 1000 mL
  Filled 2018-11-20: qty 1000

## 2018-11-20 NOTE — Progress Notes (Addendum)
PROGRESS NOTE  Shannon BradfordDeborah Meyer  ZOX:[id number] DOB: 07/04/1965 DOA: 10/22/2018 PCP: No primary care provider on file.   Brief Narrative: Shannon Meyer is a 53 y.o. female with no significant medical history who presented with cough, weakness and shortness of breath found to have covid-19 with bilateral CXR opacities, intubated in ED and [facility].   Assessment & Plan: Principal Problem:   COVID-19 Active Problems:   Leukocytosis   Sepsis (HCC)   Elevated LFTs   QT prolongation   Acute respiratory failure (HCC)   Pressure injury of skin   Tracheostomy status (HCC)  Acute hypoxic respiratory failure due to covid-19 pneumonia/HCAP: Failed extubation 6/8. - Completed remdesivir 5/21 - 5/30, received actemra 5/20, and 5/21. Received CTX 5/20-5/26, azithromycin 5/20-5/24. -She was noted to have foul-smelling sputum and mild fevers.  Respiratory culture positive for Pseudomonas.  Currently on vancomycin and Zosyn.  Will discontinue vancomycin and continue Zosyn. - Maintain euvolemia/net negative.  - Avoid NSAIDs  - Continue trach collar.  - Diurese as able.  ICU delirium:  -Patient was previously on Precedex which has since been weaned off.  She was started on twice daily clonazepam, twice daily Seroquel, scheduled oxycodone but remained quite somnolent during the day.  Her morning dose of clonazepam and Seroquel were discontinued and nightly doses have been continued.  She appears to be less drowsy today.  Following more commands.  Continue current treatments  Vomiting Etiology unclear.  Abdominal x-ray unremarkable.  Restart tube feeds at lower rate.  May have an element of constipation.  Started on Movantik.  Sinus tachycardia: 130's - 160's.  Appears to have been persistent through her hospital stay.  Pulmonary embolus has been ruled out by CTA of the chest.  Since fevers have improved, heart rate has mildly improved. - Will give metoprolol and continue monitoring.  - TSH in  normal range  Hypernatremia: Resolved. - Continue free water  Hypokalemia:  - Supplement and monitor  Positive blood cultures: 1 of 4 bottles, suspect CoNS contaminant.   Obesity: BMI 34.  - Optimize nutritional status  DVT prophylaxis: Lovenox Code Status: Full Family Communication: updated daughter on 6/18 Disposition Plan: Remain in ICU. Therapy recommending CIR eventual disposition.   Consultants:   PCCM  Procedures:   5/20 ETT  5/24 R PICC line  6/11 Tracheostomy  Antimicrobials:  Azithromycin 5/20 >> 5/24  Rocephin 5/20 >> 5/26  Remdesivir 5/25 >> 5/30  Actemra 5/20 and 5/21  Diflucan 5/30 >> 6/1   Subjective: No further vomiting after yesterday's episode.  She did not have any bowel movements.  Tube feeds have been held.  She had increased respiratory rate overnight briefly requiring to go back on ventilator.  This was transient and she is back on oxygen now  Objective: Vitals:   11/20/18 1400 11/20/18 1500 11/20/18 1600 11/20/18 1700  BP: 129/72 137/89 (!) 146/82 109/68  Pulse: (!) 107 (!) 46 (!) 117 (!) 106  Resp: (!) 41 (!) 46 (!) 30 (!) 36  Temp:      TempSrc:      SpO2: 93% 92% 96% 98%  Weight:      Height:        Intake/Output Summary (Last 24 hours) at 11/20/2018 1727 Last data filed at 11/20/2018 1700 Gross per 24 hour  Intake 706.27 ml  Output 400 ml  Net 306.27 ml   Filed Weights   11/17/18 0421 11/18/18 0500 11/19/18 0500  Weight: 93 kg 88.6 kg 88.6 kg  General exam: She is drowsy, but responds to voice and answer some questions Respiratory system: Clear to auscultation. Respiratory effort normal. Tracheostomy in place Cardiovascular system:tachycardic. No murmurs, rubs, gallops. Gastrointestinal system: Abdomen is nondistended, soft and nontender. No organomegaly or masses felt. Normal bowel sounds heard. Central nervous system: unable to assess due to mental status. Moving both lower extremities spontaneously Extremities: No  C/C/E, +pedal pulses Skin: No rashes, lesions or ulcers Psychiatry: somnolent    Data Reviewed: I have personally reviewed following labs and imaging studies  CBC: Recent Labs  Lab 11/15/18 0142 11/16/18 0330 11/18/18 0745 11/19/18 0200 11/20/18 0400  WBC 8.7 14.7* 14.7* 18.8* 17.0*  NEUTROABS  --   --  10.0* 14.3*  --   HGB 10.2* 12.0 11.6* 11.7* 10.8*  HCT 33.9* 40.3 37.4 39.7 37.1  MCV 94.7 95.7 96.6 97.3 98.7  PLT 127* 180 231 260 272   Basic Metabolic Panel: Recent Labs  Lab 11/15/18 0142 11/16/18 0330 11/18/18 0745 11/19/18 0200 11/20/18 0400  NA 142 143 141 140 143  K 3.4* 3.6 4.4 4.4 4.3  CL 108 107 100 100 103  CO2 26 28 30 31  34*  GLUCOSE 136* 122* 134* 158* 65*  BUN 24* 18 22* 23* 29*  CREATININE 0.47 0.46 0.48 0.51 0.59  CALCIUM 8.5* 8.9 9.0 9.1 9.0   GFR: Estimated Creatinine Clearance: 85.7 mL/min (by C-G formula based on SCr of 0.59 mg/dL). Liver Function Tests: Recent Labs  Lab 11/18/18 0745 11/19/18 0200 11/20/18 0400  AST 39 42* 77*  ALT 43 43 63*  ALKPHOS 158* 152* 172*  BILITOT 0.4 0.4 0.3  PROT 6.4* 6.1* 6.0*  ALBUMIN 3.1* 2.9* 2.7*   No results for input(s): LIPASE, AMYLASE in the last 168 hours. No results for input(s): AMMONIA in the last 168 hours. Coagulation Profile: No results for input(s): INR, PROTIME in the last 168 hours. Cardiac Enzymes: No results for input(s): CKTOTAL, CKMB, CKMBINDEX, TROPONINI in the last 168 hours. BNP (last 3 results) No results for input(s): PROBNP in the last 8760 hours. HbA1C: No results for input(s): HGBA1C in the last 72 hours. CBG: Recent Labs  Lab 11/19/18 2353 11/20/18 0343 11/20/18 0818 11/20/18 1239 11/20/18 1646  GLUCAP 126* 107* 98 101* 94   Lipid Profile: No results for input(s): CHOL, HDL, LDLCALC, TRIG, CHOLHDL, LDLDIRECT in the last 72 hours. Thyroid Function Tests: Recent Labs    11/19/18 0200  TSH 1.034   Anemia Panel: No results for input(s): VITAMINB12,  FOLATE, FERRITIN, TIBC, IRON, RETICCTPCT in the last 72 hours. Urine analysis:    Component Value Date/Time   COLORURINE STRAW (A) 11/01/2018 0901   APPEARANCEUR HAZY (A) 11/01/2018 0901   LABSPEC 1.010 11/01/2018 0901   PHURINE 5.0 11/01/2018 0901   GLUCOSEU NEGATIVE 11/01/2018 0901   HGBUR NEGATIVE 11/01/2018 0901   BILIRUBINUR NEGATIVE 11/01/2018 0901   KETONESUR NEGATIVE 11/01/2018 0901   PROTEINUR NEGATIVE 11/01/2018 0901   NITRITE NEGATIVE 11/01/2018 0901   LEUKOCYTESUR NEGATIVE 11/01/2018 0901   Recent Results (from the past 240 hour(s))  Culture, respiratory (non-expectorated)     Status: None (Preliminary result)   Collection Time: 11/19/18 10:59 AM   Specimen: Tracheal Aspirate; Respiratory  Result Value Ref Range Status   Specimen Description   Final    TRACHEAL ASPIRATE Performed at [facility], [address] [place], [place] [address]    Special Requests   Final    NONE Performed at [facility], [address], [place], [place] [address]    Gram Stain   Final    MODERATE WBC PRESENT,BOTH PMN AND MONONUCLEAR FEW GRAM NEGATIVE RODS FEW GRAM POSITIVE COCCI RARE GRAM VARIABLE ROD RARE YEAST    Culture   Final    FEW PSEUDOMONAS AERUGINOSA SUSCEPTIBILITIES TO FOLLOW Performed at [facility], [place] [address], [place], [place] [address]    Report Status PENDING  Incomplete      Radiology Studies: Dg Chest Port 1 View  Result Date: 11/19/2018 CLINICAL DATA:  Fever EXAM: PORTABLE CHEST 1 VIEW COMPARISON:  11/13/2018, 11/11/2018 FINDINGS: Tracheostomy tube in satisfactory position. Feeding tube coiled in the stomach. Right-sided PICC line with the tip projecting over the SVC. Worsening bilateral patchy interstitial and alveolar airspace disease throughout bilateral lungs concerning for multilobar pneumonia. No pleural effusion or pneumothorax. Stable cardiomediastinal silhouette. No aggressive osseous lesion.  IMPRESSION: 1. Support lines and tubing in satisfactory position. 2. Worsening bilateral patchy interstitial and alveolar airspace disease throughout bilateral lungs concerning for multilobar pneumonia. Electronically Signed   By: Kathreen Devoid   On: 11/19/2018 13:38   Dg Abd Portable 1v  Result Date: 11/19/2018 CLINICAL DATA:  Vomiting. EXAM: PORTABLE ABDOMEN - 1 VIEW COMPARISON:  None. FINDINGS: A feeding tube coils in the stomach with the distal tip in the fundus. No evidence of bowel obstruction. IMPRESSION: A feeding tube coils in the stomach. No evidence of bowel obstruction. No cause for vomiting noted. Electronically Signed   By: Dorise Bullion III M.D   On: 11/19/2018 16:43    Scheduled Meds:  chlorhexidine  15 mL Mouth/Throat BID   Chlorhexidine Gluconate Cloth  6 each Topical Daily   clonazepam  1 mg Per Tube QHS   famotidine  20 mg Per Tube Q12H   furosemide  40 mg Per Tube Daily   heparin injection (subcutaneous)  7,500 Units Subcutaneous Q8H   insulin aspart  0-9 Units Subcutaneous Q4H   mouth rinse  15 mL Mouth Rinse 10 times per day   metoprolol tartrate  25 mg Per Tube BID   multivitamin  15 mL Per Tube Daily   naloxegol oxalate  25 mg Oral Daily   oxyCODONE  10 mg Per Tube Q6H   potassium chloride  40 mEq Per Tube BID   QUEtiapine  100 mg Per Tube QHS   sodium chloride flush  10-40 mL Intracatheter Q12H   sodium chloride flush  3 mL Intravenous Q12H   vecuronium  10 mg Intravenous Once   Continuous Infusions:  sodium chloride Stopped (11/20/18 1322)   feeding supplement (VITAL AF 1.2 CAL)     piperacillin-tazobactam (ZOSYN)  IV 12.5 mL/hr at 11/20/18 1700     LOS: 29 days   Critical care time spent: 35 minutes.  Kathie Dike, MD [facility] [email or web address] Password [facility] 11/20/2018, 5:27 PM

## 2018-11-20 NOTE — Progress Notes (Signed)
[facility] Progress Note Patient Name: Shannon Meyer DOB: Jul 21, 1965 MRN: [id number]   Date of Service  11/20/2018  HPI/Events of Note  CorTrak tube clogged.   eICU Interventions  Will change" 1. Tylenol to Suppository PR Q 6 hours PRN Temp > 100.0 F. 2. Pepcid per tube to Pepcid IV.         Huzaifa Viney Cornelia Copa 11/20/2018, 8:27 PM

## 2018-11-20 NOTE — Progress Notes (Signed)
   11/20/18 1550  PT Visit Information  Last PT Received On 11/20/18  Reason Eval/Treat Not Completed Patient at procedure or test/unavailable: pt just began working with SLP to attempt use of PMV.    Barry Brunner, PT

## 2018-11-20 NOTE — Progress Notes (Signed)
Spoke with patient's daughter, Trust Crago and gave full update/answered all questions.

## 2018-11-20 NOTE — Progress Notes (Signed)
Pt with numerous times of desat below 88% SPO2 as documented. Little to no secretions with suctioning. PRN Versed given with little to no relief. Pt remains tachypneic. RT was notified with ultimate decision to place pt back on ventilator at this time.

## 2018-11-20 NOTE — Progress Notes (Signed)
Removed sutures from trach.  Found two small, red,swollen areas on right and left sides of trach.  Applied protective foam dressing.

## 2018-11-20 NOTE — Progress Notes (Signed)
Natalia, RN called and spoke with Dr. Oletta Darter and made MD aware that corTrak tube is clogged and of abd xray results.  MD stated that it would have to be addressed tomorrow on day shift and he would change some of meds to IV and rectal for tonight.

## 2018-11-20 NOTE — Progress Notes (Addendum)
Nutrition Follow-up RD working remotely.  DOCUMENTATION CODES:   Obesity unspecified  INTERVENTION:   Resume TF, change formula for improved tolerance:   Vital AF 1.2 at 20 ml/h   Increase by 10 ml every 4 hours to goal rate of 60 ml/h to provide 1728 kcal, 108 gm protein, 1168 ml free water daily  If patient does not tolerate Vital AF 1.2 TF formula, recommend add a pro-kinetic agent such as Reglan.  NUTRITION DIAGNOSIS:   Inadequate oral intake related to inability to eat as evidenced by NPO status.  Ongoing  GOAL:   Provide needs based on ASPEN/SCCM guidelines  Met with TF  MONITOR:   Vent status, TF tolerance, Labs, Skin  ASSESSMENT:   53 yo female with no significant PMH who was admitted with COVID-19.  Currently on trach collar. Required vent support for a short time last night.    PT consulted today. SLP following for PMV use.  Labs reviewed. CBG's: 107-98  Medications reviewed and include Colace BID, Lasix, Novolog, MVI, KCl, Miralax prn.  Post-pyloric Cortrak tube in place. TF off since yesterday around noon due to emesis. Per discussion with MD via secure chat, okay to resume TF today. Will try a semi-elemental formula.   Diet Order:   Diet Order            Diet NPO time specified  Diet effective midnight              EDUCATION NEEDS:   No education needs have been identified at this time  Skin:  Skin Assessment: Skin Integrity Issues: Skin Integrity Issues:: Stage II Stage II: perineum  Last BM:  6/17  Height:   Ht Readings from Last 1 Encounters:  10/22/18 '5\' 4"'  (1.626 m)    Weight:   Wt Readings from Last 1 Encounters:  11/19/18 88.6 kg    Ideal Body Weight:  54.5 kg  BMI:  Body mass index is 33.53 kg/m.  Estimated Nutritional Needs:   Kcal:  1600-1800  Protein:  109 gm  Fluid:  >/= 1.8 L    Molli Barrows, RD, LDN, [place] Pager [phone] After Hours Pager [phone]

## 2018-11-20 NOTE — Progress Notes (Signed)
NAME:  Shannon BradfordDeborah Meyer, MRN:  [id number], DOB:  02/06/1966, LOS: 29 ADMISSION DATE:  10/22/2018, CONSULTATION DATE:  10/22/2018 REFERRING MD:  Pilar PlateBero, CHIEF COMPLAINT:  Dyspnea   Brief History   53 y/o female with no PMH [facility] due to ARDS due to COVID 19 pneumonia.  Past Medical History  None  Significant Hospital Events   5/20 Intubated in ED and [facility] ICU 5/21-5/26 Weaning FIO2 and PEEP, diuresing 5/26-6/3 Tolerating weaning for 1-2 hours 6/7 weaning on pressure support ventilation again this morning 10/5 6/8 extubated, reintubated (tachypnea, tachycardia) 6/12 Trached 6/13 Trach collar all day  6/14 bleeding from tracheostomy site 6/17 48 hours off vent, fever  Consults:  Pulmonary and critical care medicine  Procedures:  5/20 ET tube>6/11  Trach 6/11 >> 6/7 PICC line right arm>  Significant Diagnostic Tests:  Oct 28, 2018 CT angiogram chest no pulmonary embolism, bilateral atypical pneumonia consistent with COVID-19  Micro Data:  SARS-CoV-2 May 20+ Blood culture May 20 1 out of 2 GPC staph, coag negative  Antimicrobials:  Zithromax 5/20 >>5/24 Rocephin 5/20 >>5/26 Remdesivir 5/21 >5/30 Actemra 5/20>5/21 Solumedrol 5/12  > 5/26   Vanc 6/17 >  Zosyn 6/17 >   Interim history/subjective:   Placed back on ventilator briefly overnight Taken off again this morning, while on mechanical ventilation respiratory rate and heart rate and oxygenation were the same as when she is not on the ventilator A bit more awake and alert today Continues to have constipation  Objective   Blood pressure 138/77, pulse (!) 119, temperature 98.9 F (37.2 C), resp. rate (!) 25, height 5\' 4"  (1.626 m), weight 88.6 kg, SpO2 100 %.    Vent Mode: PCV FiO2 (%):  [35 %-50 %] 50 % Set Rate:  [20 bmp] 20 bmp PEEP:  [8 cmH20] 8 cmH20 Plateau Pressure:  [38 cmH20] 38 cmH20   Intake/Output Summary (Last 24 hours) at 11/20/2018  0810 Last data filed at 11/20/2018 0600 Gross per 24 hour  Intake 1205.88 ml  Output 1400 ml  Net -194.12 ml   Filed Weights   11/17/18 0421 11/18/18 0500 11/19/18 0500  Weight: 93 kg 88.6 kg 88.6 kg    Examination:  General:  In bed on vent HENT: NCAT ETT in place PULM: CTA B, vent supported breathing CV: RRR, no mgr GI: BS+, soft, nontender MSK: normal bulk and tone Neuro: sedated on vent   6/17 CXR > worsening infiltrates bilatearlly  Resolved Hospital Problem list     Assessment & Plan:  ARDS due to COVID-19 pneumonia Continue to maintain off of ventilator today Remove tracheostomy sutures today Monitor respiratory status closely Monitor O2 saturation, target greater than 88% Tracheostomy care per routine Mobilize today, physical therapy/Occupational Therapy consult  Delirium/ICU encephalopathy:  Treat infection Continue oxycodone, clonazepam and quetiapine However, if no improvement tomorrow consider dropping these doses  Possible HCAP: worsening infiltrates, WBC up, increased secretions Pseudomonas pneumonia Stop vancomycin Continue Zosyn  Severe physical deconditioning: Physical therapy consult  Best practice:  Diet: Tube feeding Pain/Anxiety/Delirium protocol (if indicated): RASS goal 0 VAP protocol (if indicated): Yes DVT prophylaxis: SCDs GI prophylaxis: Famotidine Glucose control: SSI Mobility: PT consult Code Status: Full Family Communication: will contact today Disposition: remain in ICU  Labs   CBC: Recent Labs  Lab 11/15/18 0142 11/16/18 0330 11/18/18 0745 11/19/18 0200 11/20/18 0400  WBC 8.7 14.7* 14.7* 18.8* 17.0*  NEUTROABS  --   --  10.0* 14.3*  --  HGB 10.2* 12.0 11.6* 11.7* 10.8*  HCT 33.9* 40.3 37.4 39.7 37.1  MCV 94.7 95.7 96.6 97.3 98.7  PLT 127* 180 231 260 254    Basic Metabolic Panel: Recent Labs  Lab 11/15/18 0142 11/16/18 0330 11/18/18 0745 11/19/18 0200 11/20/18 0400  NA 142 143 141 140 143  K 3.4*  3.6 4.4 4.4 4.3  CL 108 107 100 100 103  CO2 26 28 30 31  34*  GLUCOSE 136* 122* 134* 158* 65*  BUN 24* 18 22* 23* 29*  CREATININE 0.47 0.46 0.48 0.51 0.59  CALCIUM 8.5* 8.9 9.0 9.1 9.0   GFR: Estimated Creatinine Clearance: 85.7 mL/min (by C-G formula based on SCr of 0.59 mg/dL). Recent Labs  Lab 11/16/18 0330 11/18/18 0745 11/19/18 0200 11/20/18 0400  WBC 14.7* 14.7* 18.8* 17.0*    Liver Function Tests: Recent Labs  Lab 11/18/18 0745 11/19/18 0200 11/20/18 0400  AST 39 42* 77*  ALT 43 43 63*  ALKPHOS 158* 152* 172*  BILITOT 0.4 0.4 0.3  PROT 6.4* 6.1* 6.0*  ALBUMIN 3.1* 2.9* 2.7*   No results for input(s): LIPASE, AMYLASE in the last 168 hours. No results for input(s): AMMONIA in the last 168 hours.  ABG    Component Value Date/Time   PHART 7.432 11/10/2018 1722   PCO2ART 36.0 11/10/2018 1722   PO2ART 77.0 (L) 11/10/2018 1722   HCO3 23.9 11/10/2018 1722   TCO2 25 11/10/2018 1722   ACIDBASEDEF 1.0 10/24/2018 0419   O2SAT 95.0 11/10/2018 1722     Coagulation Profile: Recent Labs  Lab 11/13/18 0854  INR 0.9    Cardiac Enzymes: No results for input(s): CKTOTAL, CKMB, CKMBINDEX, TROPONINI in the last 168 hours.  HbA1C: No results found for: HGBA1C  CBG: Recent Labs  Lab 11/19/18 1122 11/19/18 1519 11/19/18 2100 11/19/18 2353 11/20/18 0343  GLUCAP 148* 108* 122* 126* 107*    Critical care time: 31 minutes    Roselie Awkward, MD [place] [facility] Pager: [phone] Cell: [phone] If no response, call [phone]

## 2018-11-20 NOTE — Progress Notes (Signed)
  Speech Language Pathology Treatment: [name] Speaking valve  Patient Details Name: Shannon Meyer MRN: [id number] DOB: 1965/09/26 Today's Date: 11/20/2018 Time: 5366-4403 SLP Time Calculation (min) (ACUTE ONLY): 20 min  Assessment / Plan / Recommendation Clinical Impression  RN cleared SLP to see pt for PMV with RR ranging 38-43 and mildly labored work of breathing. ST hesitant to attempt PMV trial however RN stated this has been her norm. Pt apparently aspirated after vomiting episode yesterday and was placed back on vent. Pt's current trach set up is now a tracheal HME versus the T-bar (changed Friday due to shortage of T-bar set up). Pt was drowsy but would open eyes momentarily on command. Goal was short trial of PMV since HME/O2 adapter had to be removed for PMV to fit. Her cuff was deflated by RT, PMV donned with audible noise during respiration. PMV removed once without back pressure. She did not attempt purposeful verbalization when cued. O2 sats fell from mid 90's to 71% and PMV immediately removed and HME/O2 adapter replaced; RT reinflated cuff. ST will continue trials with PMV however success may be limited given valve and O2 cannot be simultaneously donned and she currently does not have O2 reserve without supplemental oxygen.   HPI HPI: Pt adm 5/20 with hypoxic resp failure due to Covid 19. Pt intubated 5/20 and extubated 6/8, with re-intubation 6/8; Trach 6/11. PMH - none.       SLP Plan  Continue with current plan of care       Recommendations         Patient may use Passy-Muir Speech Valve: Intermittently with supervision PMSV Supervision: Full         Follow up Recommendations: Inpatient Rehab SLP Visit Diagnosis: Aphonia (R49.1) Plan: Continue with current plan of care       GO                [name] 11/20/2018, 4:29 PM  Orbie Pyo Colvin Caroli.Ed Risk analyst [phone] Office [phone]

## 2018-11-21 ENCOUNTER — Inpatient Hospital Stay (HOSPITAL_COMMUNITY): Payer: HRSA Program

## 2018-11-21 LAB — COMPREHENSIVE METABOLIC PANEL
ALT: 59 U/L — ABNORMAL HIGH (ref 0–44)
AST: 50 U/L — ABNORMAL HIGH (ref 15–41)
Albumin: 3 g/dL — ABNORMAL LOW (ref 3.5–5.0)
Alkaline Phosphatase: 180 U/L — ABNORMAL HIGH (ref 38–126)
Anion gap: 15 (ref 5–15)
BUN: 19 mg/dL (ref 6–20)
CO2: 27 mmol/L (ref 22–32)
Calcium: 8.8 mg/dL — ABNORMAL LOW (ref 8.9–10.3)
Chloride: 99 mmol/L (ref 98–111)
Creatinine, Ser: 0.6 mg/dL (ref 0.44–1.00)
GFR calc Af Amer: >60 mL/min (ref >60)
GFR calc non Af Amer: >60 mL/min (ref >60)
Glucose, Bld: 103 mg/dL — ABNORMAL HIGH (ref 70–99)
Potassium: 3.5 mmol/L (ref 3.5–5.1)
Sodium: 141 mmol/L (ref 135–145)
Total Bilirubin: 0.5 mg/dL (ref 0.3–1.2)
Total Protein: 6.3 g/dL — ABNORMAL LOW (ref 6.5–8.1)

## 2018-11-21 LAB — CBC
HCT: 38.9 % (ref 36.0–46.0)
Hemoglobin: 11.4 g/dL — ABNORMAL LOW (ref 12.0–15.0)
MCH: 28.7 pg (ref 26.0–34.0)
MCHC: 29.3 g/dL — ABNORMAL LOW (ref 30.0–36.0)
MCV: 98 fL (ref 80.0–100.0)
Platelets: 337 10*3/uL (ref 150–400)
RBC: 3.97 MIL/uL (ref 3.87–5.11)
RDW: 18 % — ABNORMAL HIGH (ref 11.5–15.5)
WBC: 20.5 10*3/uL — ABNORMAL HIGH (ref 4.0–10.5)
nRBC: 0.1 % (ref 0.0–0.2)

## 2018-11-21 LAB — GLUCOSE, CAPILLARY
Glucose-Capillary: 104 mg/dL — ABNORMAL HIGH (ref 70–99)
Glucose-Capillary: 108 mg/dL — ABNORMAL HIGH (ref 70–99)
Glucose-Capillary: 88 mg/dL (ref 70–99)
Glucose-Capillary: 91 mg/dL (ref 70–99)
Glucose-Capillary: 98 mg/dL (ref 70–99)

## 2018-11-21 LAB — CULTURE, RESPIRATORY W GRAM STAIN

## 2018-11-21 MED ORDER — FAMOTIDINE 40 MG/5ML PO SUSR
20.0000 mg | Freq: Two times a day (BID) | ORAL | Status: DC
  Administered 2018-11-21 – 2018-11-25 (×8): 20 mg via ORAL
  Filled 2018-11-21 (×8): qty 2.5

## 2018-11-21 MED ORDER — QUETIAPINE FUMARATE 50 MG PO TABS
50.0000 mg | ORAL_TABLET | Freq: Every day | ORAL | Status: DC
  Administered 2018-11-21: 50 mg
  Filled 2018-11-21: qty 1

## 2018-11-21 MED ORDER — OXYCODONE HCL 5 MG PO TABS
5.0000 mg | ORAL_TABLET | Freq: Four times a day (QID) | ORAL | Status: DC
  Administered 2018-11-22 – 2018-11-25 (×14): 5 mg
  Filled 2018-11-21 (×15): qty 1

## 2018-11-21 MED ORDER — SODIUM CHLORIDE 0.9 % IV SOLN
2.0000 g | Freq: Three times a day (TID) | INTRAVENOUS | Status: AC
  Administered 2018-11-21 – 2018-11-28 (×20): 2 g via INTRAVENOUS
  Filled 2018-11-21 (×20): qty 2

## 2018-11-21 MED ORDER — SENNOSIDES 8.8 MG/5ML PO SYRP
5.0000 mL | ORAL_SOLUTION | Freq: Two times a day (BID) | ORAL | Status: DC
  Administered 2018-11-21 – 2018-11-22 (×2): 5 mL via ORAL
  Filled 2018-11-21 (×3): qty 5

## 2018-11-21 MED ORDER — POLYETHYLENE GLYCOL 3350 17 G PO PACK: 17.0000 g | PACK | Freq: Two times a day (BID) | ORAL | Status: DC

## 2018-11-21 MED ORDER — CLONAZEPAM 0.5 MG PO TBDP
0.5000 mg | ORAL_TABLET | Freq: Every day | ORAL | Status: DC
  Administered 2018-11-21: 0.5 mg
  Filled 2018-11-21: qty 1

## 2018-11-21 MED ORDER — SODIUM CHLORIDE 0.9 % IV SOLN
2.0000 g | Freq: Three times a day (TID) | INTRAVENOUS | Status: DC
  Filled 2018-11-21 (×2): qty 2

## 2018-11-21 NOTE — Progress Notes (Signed)
NAME:  Shannon BradfordDeborah Fricker, MRN:  [id number], DOB:  03/23/1966, LOS: 30 ADMISSION DATE:  10/22/2018, CONSULTATION DATE:  10/22/2018 REFERRING MD:  Pilar PlateBero, CHIEF COMPLAINT:  Dyspnea   Brief History   53 y/o female with no PMH admitted [facility], 2018 due to ARDS due to COVID 19 pneumonia.  Past Medical History  None  Significant Hospital Events   5/20 Intubated in ED and admitted [facility] ICU 5/21-5/26 Weaning FIO2 and PEEP, diuresing 5/26-6/3 Tolerating weaning for 1-2 hours 6/7 weaning on pressure support ventilation again this morning 10/5 6/8 extubated, reintubated (tachypnea, tachycardia) 6/12 Trached 6/13 Trach collar all day  6/14 bleeding from tracheostomy site 6/17 48 hours off vent, fever  Consults:  Pulmonary and critical care medicine  Procedures:  5/20 ET tube>6/11  Trach 6/11 >> 6/7 PICC line right arm>  Significant Diagnostic Tests:  Oct 28, 2018 CT angiogram chest no pulmonary embolism, bilateral atypical pneumonia consistent with COVID-19  Micro Data:  SARS-CoV-2 May 20+ Blood culture May 20 1 out of 2 GPC staph, coag negative  Antimicrobials:  Zithromax 5/20 >>5/24 Rocephin 5/20 >>5/26 Remdesivir 5/21 >5/30 Actemra 5/20>5/21 Solumedrol 5/12  > 5/26   Vanc 6/17 >  Zosyn 6/17 >   Interim history/subjective:   Core tract tube was clogged yesterday, unable to receive enteral medicines because of that.  Objective   Blood pressure 131/77, pulse (!) 113, temperature 98.5 F (36.9 C), temperature source Axillary, resp. rate (!) 28, height 5\' 4"  (1.626 m), weight 86.5 kg, SpO2 93 %.        Intake/Output Summary (Last 24 hours) at 11/21/2018 1201 Last data filed at 11/21/2018 0800 Gross per 24 hour  Intake 271.56 ml  Output 550 ml  Net -278.44 ml   Filed Weights   11/18/18 0500 11/19/18 0500 11/21/18 0448  Weight: 88.6 kg 88.6 kg 86.5 kg    Examination:  General:  In bed  HENT: NCAT trach in place, no bleeding  PULM: CTA B, vent supported breathing CV: RRR, no mgr GI: BS+, soft, nontender MSK: normal bulk and tone Neuro: awake, making purposeful movement, tracking with eyes   6/17 CXR > worsening infiltrates bilatearlly 6/19 Abdominal X-ray> NG likely kinked  Resolved Hospital Problem list     Assessment & Plan:  ARDS due to COVID-19 pneumonia Continue to maintain off of ventilator today Tracheostomy care per routine Monitor respiratory status in ICU setting Continue to monitor O2 saturation, target greater than 88% Mobilize today, physical therapy, Occupational Therapy consultation  Delirium/ICU encephalopathy: improving Wean sedatives Frequent orientation Out of bed  Possible HCAP: worsening infiltrates, WBC up, increased secretions Pseudomonas pneumonia Continue zosyn  Severe physical deconditioning: PT/OT consult  Best practice:  Diet: Tube feeding Pain/Anxiety/Delirium protocol (if indicated): RASS goal 0 VAP protocol (if indicated): Yes DVT prophylaxis: SCDs GI prophylaxis: Famotidine Glucose control: SSI Mobility: PT consult Code Status: Full Family Communication: will contact today Disposition: remain in ICU  Labs   CBC: Recent Labs  Lab 11/16/18 0330 11/18/18 0745 11/19/18 0200 11/20/18 0400 11/21/18 0445  WBC 14.7* 14.7* 18.8* 17.0* 20.5*  NEUTROABS  --  10.0* 14.3*  --   --   HGB 12.0 11.6* 11.7* 10.8* 11.4*  HCT 40.3 37.4 39.7 37.1 38.9  MCV 95.7 96.6 97.3 98.7 98.0  PLT 180 231 260 272 337    Basic Metabolic Panel: Recent Labs  Lab 11/16/18 0330 11/18/18 0745 11/19/18 0200 11/20/18 0400 11/21/18 0445  NA 143 141 140  143 141  K 3.6 4.4 4.4 4.3 3.5  CL 107 100 100 103 99  CO2 28 30 31  34* 27  GLUCOSE 122* 134* 158* 65* 103*  BUN 18 22* 23* 29* 19  CREATININE 0.46 0.48 0.51 0.59 0.60  CALCIUM 8.9 9.0 9.1 9.0 8.8*   GFR: Estimated Creatinine Clearance: 84.5 mL/min (by C-G formula based on SCr of 0.6 mg/dL). Recent Labs  Lab 11/18/18  0745 11/19/18 0200 11/20/18 0400 11/21/18 0445  WBC 14.7* 18.8* 17.0* 20.5*    Liver Function Tests: Recent Labs  Lab 11/18/18 0745 11/19/18 0200 11/20/18 0400 11/21/18 0445  AST 39 42* 77* 50*  ALT 43 43 63* 59*  ALKPHOS 158* 152* 172* 180*  BILITOT 0.4 0.4 0.3 0.5  PROT 6.4* 6.1* 6.0* 6.3*  ALBUMIN 3.1* 2.9* 2.7* 3.0*   No results for input(s): LIPASE, AMYLASE in the last 168 hours. No results for input(s): AMMONIA in the last 168 hours.  ABG    Component Value Date/Time   PHART 7.432 11/10/2018 1722   PCO2ART 36.0 11/10/2018 1722   PO2ART 77.0 (L) 11/10/2018 1722   HCO3 23.9 11/10/2018 1722   TCO2 25 11/10/2018 1722   ACIDBASEDEF 1.0 10/24/2018 0419   O2SAT 95.0 11/10/2018 1722     Coagulation Profile: No results for input(s): INR, PROTIME in the last 168 hours.  Cardiac Enzymes: No results for input(s): CKTOTAL, CKMB, CKMBINDEX, TROPONINI in the last 168 hours.  HbA1C: No results found for: HGBA1C  CBG: Recent Labs  Lab 11/20/18 1646 11/20/18 1946 11/21/18 0022 11/21/18 0348 11/21/18 0742  GLUCAP 94 75 91 108* 88    Critical care time: 30 minutes    Roselie Awkward, MD [facility] Pager: [phone] Cell: [phone] If no response, call [phone]

## 2018-11-21 NOTE — Progress Notes (Signed)
Occupational Therapy Treatment Patient Details Name: Shannon Meyer MRN: [id number] DOB: [birth year] Today's Date: 11/21/2018    History of present illness Pt adm 5/20 with hypoxic resp failure due to Covid 19. Pt intubated 5/20 and extubated 6/8, with re-intubation 6/8; Trach 6/11. PMH - none. 6/17 foul smelling trach site/secretions, elevated WBC and temp; vomited tube feeds.   OT comments  Pt seen on 8L; RR 30s - 40s; SpO2 drops to 84 with increased activity but poor  Pleth; HR less than 120 throughout. Pt moved upright into chair position. Pt inconsistently following commands, but appears to be assisting at times with exercise and activities(combing hair/brushing teeth). Pt scratching head using L hand. Music used partial session to increase arousal and participation with OT. Pt appeared to be nodding head to beat of music and moving U/Le to beat of music at times. Pt able to give thumbs up and squeeze hand on command. Pt restless throughout session but improved. Will increase mobility as pt tolerates beginning of week..   Follow Up Recommendations  CIR;Supervision/Assistance - 24 hour    Equipment Recommendations  3 in 1 bedside commode;Other (comment)(will further assess)    Recommendations for Other Services Rehab consult    Precautions / Restrictions Precautions Precautions: Fall Precaution Comments: t-bar, multiple lines; coretrack       Mobility Bed Mobility Overal bed mobility: Needs Assistance   Rolling: Total assist;+2 for physical assistance            Transfers                 General transfer comment: not attempted    Balance Overall balance assessment: Needs assistance                                         ADL either performed or assessed with clinical judgement   ADL                                         General ADL Comments: total A     Vision   Additional Comments: eyes closed majority of session;  would open eyes to name; brief focused attention to cell phone to R& B video; would track phone briefly   Perception     Praxis      Cognition Arousal/Alertness: Lethargic Behavior During Therapy: Restless;Flat affect Overall Cognitive Status: Impaired/Different from baseline Area of Impairment: Attention;Following commands;Awareness;Problem solving                   Current Attention Level: Focused   Following Commands: Follows one step commands inconsistently Safety/Judgement: Decreased awareness of safety;Decreased awareness of deficits Awareness: Intellectual Problem Solving: Slow processing;Decreased initiation;Difficulty sequencing;Requires verbal cues;Requires tactile cues General Comments: gives "thumbs up" with either hand, raises one finger, AAROM bil UEs,         Exercises Exercises: Other exercises Other Exercises Other Exercises: Bed moved into modified chair position. Facilitated forard trunk movemetn/sitting up. Pt attempting to assist with repetition. Limited by poor attention and desat   Shoulder Instructions       General Comments Used music at attempt to facilitate movemetnt; pt appesred to be moving head to beat of music at times adn BU/LE    Pertinent Vitals/ Pain       Pain Assessment: Faces  Faces Pain Scale: Hurts a little bit Pain Location: generalized Pain Descriptors / Indicators: Discomfort;Grimacing Pain Intervention(s): Limited activity within patient's tolerance  Home Living                                          Prior Functioning/Environment              Frequency  Min 3X/week        Progress Toward Goals  OT Goals(current goals can now be found in the care plan section)  Progress towards OT goals: Progressing toward goals  Acute Rehab OT Goals Patient Stated Goal: none stated OT Goal Formulation: Patient unable to participate in goal setting Time For Goal Achievement: 11/24/18 Potential to  Achieve Goals: Good ADL Goals Pt Will Perform Grooming: with min assist;sitting Pt Will Transfer to Toilet: stand pivot transfer;bedside commode;with mod assist Pt Will Perform Toileting - Clothing Manipulation and hygiene: with mod assist;sit to/from stand;sitting/lateral leans Additional ADL Goal #1: Pt will perform bed mobility with Min A in preparation for ADLs Additional ADL Goal #2: Pt will tolerate sitting at EOB for 10 minutes with Min Guard A in preparation for ADLs Additional ADL Goal #3: Pt will demonstrate selective attention during ADL in a distracting environement with Min cues  Plan Discharge plan remains appropriate    Co-evaluation                 AM-PAC OT "6 Clicks" Daily Activity     Outcome Measure   Help from another person eating meals?: Total Help from another person taking care of personal grooming?: Total Help from another person toileting, which includes using toliet, bedpan, or urinal?: Total Help from another person bathing (including washing, rinsing, drying)?: Total Help from another person to put on and taking off regular upper body clothing?: Total Help from another person to put on and taking off regular lower body clothing?: Total 6 Click Score: 6    End of Session Equipment Utilized During Treatment: Oxygen(8L)  OT Visit Diagnosis: Unsteadiness on feet (R26.81);Other abnormalities of gait and mobility (R26.89);Muscle weakness (generalized) (M62.81);Other symptoms and signs involving cognitive function Pain - part of body: (generalized)   Activity Tolerance Patient limited by lethargy   Patient Left in bed;with call bell/phone within reach;with nursing/sitter in room   Nurse Communication Mobility status        Time: 1640-1710 OT Time Calculation (min): 30 min  Charges: OT General Charges $OT Visit: 1 Visit OT Treatments $Self Care/Home Management : 23-37 mins  Luisa DagoHilary Marrian Bells, OT/L   Acute OT Clinical Specialist Acute  Rehabilitation Services Pager [phone] Office [phone]    [facility] LP[facility],HILLARY 11/21/2018, 5:30 PM

## 2018-11-21 NOTE — Progress Notes (Addendum)
Slept during most of night. Restless at times.  Remained tachypneic but unlabored.  Followed commands most of the time and seemed able to reorient and redirect when agitated. CorTrak remains in place but clogged.

## 2018-11-21 NOTE — Progress Notes (Signed)
Spoke with patient's sister on the phone and updated her on patient's status and care.

## 2018-11-21 NOTE — Progress Notes (Signed)
  PT Note- OT will see patient for therapy today. PT will follow up Monday. [facility] [phone]

## 2018-11-21 NOTE — Progress Notes (Addendum)
PROGRESS NOTE  Shannon Meyer  WCH:[id number] DOB: April 17, 1966 DOA: 10/22/2018 PCP: No primary care provider on file.   Brief Narrative: Shannon Meyer is a 53 y.o. female with no significant medical history who presented with cough, weakness and shortness of breath found to have covid-19 with bilateral CXR opacities, intubated in ED and admitted to [facility] 5/20.   Assessment & Plan: Principal Problem:   COVID-19 Active Problems:   Leukocytosis   Sepsis ([place])   Elevated LFTs   QT prolongation   Acute respiratory failure (HCC)   Pressure injury of skin   Tracheostomy status (HCC)  Acute hypoxic respiratory failure due to covid-19 pneumonia/HCAP: Failed extubation 6/8. - Completed remdesivir 5/21 - 5/30, received actemra 5/20, and 5/21. Received CTX 5/20-5/26, azithromycin 5/20-5/24. -She was noted to have foul-smelling sputum and mild fevers.  Respiratory culture positive for Pseudomonas.  Currently on Zosyn.  Based on culture sensitivities, she has been changed to ceftazidime. - Maintain euvolemia/net negative.  - Avoid NSAIDs  - Continue trach collar.  - Diurese as able.  ICU delirium:  -Patient was previously on Precedex which has since been weaned off.  She was started on twice daily clonazepam, twice daily Seroquel, scheduled oxycodone but remained quite somnolent during the day.  Her morning dose of clonazepam and Seroquel were discontinued and nightly doses are being adjusted.  She appears to be less drowsy today.  Following more commands.  Continue current treatments  Sinus tachycardia: 130's - 160's.  Appears to have been persistent through her hospital stay.  Pulmonary embolus has been ruled out by CTA of the chest.  Since fevers have improved, heart rate has mildly improved. - Will give metoprolol and continue monitoring.  - TSH in normal range  Hypernatremia: Resolved. - Continue free water  Hypokalemia:  - Supplement and monitor  Positive blood cultures: 1 of 4 bottles,  suspect CoNS contaminant.   Obesity: BMI 34.  - Optimize nutritional status  DVT prophylaxis: Lovenox Code Status: Full Family Communication: updated daughter on 6/19 Disposition Plan: Remain in ICU. Therapy recommending CIR eventual disposition.   Consultants:   PCCM  Procedures:   5/20 ETT  5/24 R PICC line  6/11 Tracheostomy  Antimicrobials:  Azithromycin 5/20 >> 5/24  Rocephin 5/20 >> 5/26  Remdesivir 5/25 >> 5/30  Actemra 5/20 and 5/21  Diflucan 5/30 >> 6/1   Subjective: Feeding tube was kinked overnight and she was unable to get any medications enterally.  She is more awake and following commands today although this does appear to be intermittent  Objective: Vitals:   11/21/18 1200 11/21/18 1205 11/21/18 1300 11/21/18 1541  BP: (!) 116/56 (!) 116/56 138/86 (!) 148/87  Pulse: (!) 105 (!) 105 95 (!) 115  Resp: (!) 25 (!) 41 (!) 39 (!) 30  Temp:   98.5 F (36.9 C)   TempSrc:   Oral   SpO2: 98% 96% 98% 92%  Weight:      Height:        Intake/Output Summary (Last 24 hours) at 11/21/2018 1818 Last data filed at 11/21/2018 1400 Gross per 24 hour  Intake 317.83 ml  Output 825 ml  Net -507.17 ml   Filed Weights   11/18/18 0500 11/19/18 0500 11/21/18 0448  Weight: 88.6 kg 88.6 kg 86.5 kg   General exam: She is drowsy, but responds to voice and answer some questions Respiratory system: Clear to auscultation. Respiratory effort normal. Tracheostomy in place Cardiovascular system:tachycardic. No murmurs, rubs, gallops. Gastrointestinal system: Abdomen is  nondistended, soft and nontender. No organomegaly or masses felt. Normal bowel sounds heard. Central nervous system: unable to assess due to mental status. Moving both lower extremities spontaneously Extremities: No C/C/E, +pedal pulses Skin: No rashes, lesions or ulcers Psychiatry: somnolent    Data Reviewed: I have personally reviewed following labs and imaging studies  CBC: Recent Labs  Lab  11/16/18 0330 11/18/18 0745 11/19/18 0200 11/20/18 0400 11/21/18 0445  WBC 14.7* 14.7* 18.8* 17.0* 20.5*  NEUTROABS  --  10.0* 14.3*  --   --   HGB 12.0 11.6* 11.7* 10.8* 11.4*  HCT 40.3 37.4 39.7 37.1 38.9  MCV 95.7 96.6 97.3 98.7 98.0  PLT 180 231 260 272 337   Basic Metabolic Panel: Recent Labs  Lab 11/16/18 0330 11/18/18 0745 11/19/18 0200 11/20/18 0400 11/21/18 0445  NA 143 141 140 143 141  K 3.6 4.4 4.4 4.3 3.5  CL 107 100 100 103 99  CO2 28 30 31  34* 27  GLUCOSE 122* 134* 158* 65* 103*  BUN 18 22* 23* 29* 19  CREATININE 0.46 0.48 0.51 0.59 0.60  CALCIUM 8.9 9.0 9.1 9.0 8.8*   GFR: Estimated Creatinine Clearance: 84.5 mL/min (by C-G formula based on SCr of 0.6 mg/dL). Liver Function Tests: Recent Labs  Lab 11/18/18 0745 11/19/18 0200 11/20/18 0400 11/21/18 0445  AST 39 42* 77* 50*  ALT 43 43 63* 59*  ALKPHOS 158* 152* 172* 180*  BILITOT 0.4 0.4 0.3 0.5  PROT 6.4* 6.1* 6.0* 6.3*  ALBUMIN 3.1* 2.9* 2.7* 3.0*   No results for input(s): LIPASE, AMYLASE in the last 168 hours. No results for input(s): AMMONIA in the last 168 hours. Coagulation Profile: No results for input(s): INR, PROTIME in the last 168 hours. Cardiac Enzymes: No results for input(s): CKTOTAL, CKMB, CKMBINDEX, TROPONINI in the last 168 hours. BNP (last 3 results) No results for input(s): PROBNP in the last 8760 hours. HbA1C: No results for input(s): HGBA1C in the last 72 hours. CBG: Recent Labs  Lab 11/21/18 0022 11/21/18 0348 11/21/18 0742 11/21/18 1221 11/21/18 1648  GLUCAP 91 108* 88 104* 98   Lipid Profile: No results for input(s): CHOL, HDL, LDLCALC, TRIG, CHOLHDL, LDLDIRECT in the last 72 hours. Thyroid Function Tests: Recent Labs    11/19/18 0200  TSH 1.034   Anemia Panel: No results for input(s): VITAMINB12, FOLATE, FERRITIN, TIBC, IRON, RETICCTPCT in the last 72 hours. Urine analysis:    Component Value Date/Time   COLORURINE STRAW (A) 11/01/2018 0901    APPEARANCEUR HAZY (A) 11/01/2018 0901   LABSPEC 1.010 11/01/2018 0901   PHURINE 5.0 11/01/2018 0901   GLUCOSEU NEGATIVE 11/01/2018 0901   HGBUR NEGATIVE 11/01/2018 0901   BILIRUBINUR NEGATIVE 11/01/2018 0901   KETONESUR NEGATIVE 11/01/2018 0901   PROTEINUR NEGATIVE 11/01/2018 0901   NITRITE NEGATIVE 11/01/2018 0901   LEUKOCYTESUR NEGATIVE 11/01/2018 0901   Recent Results (from the past 240 hour(s))  Culture, respiratory (non-expectorated)     Status: None   Collection Time: 11/19/18 10:59 AM   Specimen: Tracheal Aspirate; Respiratory  Result Value Ref Range Status   Specimen Description   Final    TRACHEAL ASPIRATE Performed at [name], [address] [place], [place] [address]    Special Requests   Final    NONE Performed at [name], [address] [place], [place] [address]    Gram Stain   Final    MODERATE WBC PRESENT,BOTH PMN AND MONONUCLEAR FEW GRAM NEGATIVE RODS FEW Romie MinusGRAM  POSITIVE COCCI RARE GRAM VARIABLE ROD RARE YEAST Performed at [facility], [address] [place], [place] [address]    Culture FEW PSEUDOMONAS AERUGINOSA  Final   Report Status 11/21/2018 FINAL  Final   Organism ID, Bacteria PSEUDOMONAS AERUGINOSA  Final      Susceptibility   Pseudomonas aeruginosa - MIC*    CEFTAZIDIME 4 SENSITIVE Sensitive     CIPROFLOXACIN <=0.25 SENSITIVE Sensitive     GENTAMICIN <=1 SENSITIVE Sensitive     IMIPENEM <=0.25 SENSITIVE Sensitive     PIP/TAZO <=4 SENSITIVE Sensitive     CEFEPIME <=1 SENSITIVE Sensitive     * FEW PSEUDOMONAS AERUGINOSA      Radiology Studies: Dg Abd Portable 1v  Result Date: 11/21/2018 CLINICAL DATA:  NG tube placement. EXAM: PORTABLE ABDOMEN - 1 VIEW COMPARISON:  Plain films of the abdomen dated 11/20/2018 and 11/19/2018. FINDINGS: Weighted tip feeding tube now appears appropriately positioned with tip in the proximal duodenum. Visualized bowel gas pattern is nonobstructive. IMPRESSION:  Weighted tip feeding tube now appears appropriately positioned with tip in the proximal duodenum. Electronically Signed   By: Bary RichardStan  Maynard M.D.   On: 11/21/2018 12:24   Dg Abd Portable 1v  Result Date: 11/20/2018 CLINICAL DATA:  Encounter for NG tube placement EXAM: PORTABLE ABDOMEN - 1 VIEW COMPARISON:  11/19/2018 FINDINGS: Feeding tube coils in the stomach with the tip in the fundus. Nonobstructive bowel gas pattern. No organomegaly or free air. IMPRESSION: Feeding tube coils in the stomach with the tip in the fundus. Electronically Signed   By: Charlett NoseKevin  Dover M.D.   On: 11/20/2018 19:57    Scheduled Meds:  chlorhexidine  15 mL Mouth/Throat BID   Chlorhexidine Gluconate Cloth  6 each Topical Daily   clonazepam  0.5 mg Per Tube QHS   famotidine  20 mg Oral BID   furosemide  40 mg Per Tube Daily   heparin injection (subcutaneous)  7,500 Units Subcutaneous Q8H   insulin aspart  0-9 Units Subcutaneous Q4H   mouth rinse  15 mL Mouth Rinse 10 times per day   metoprolol tartrate  25 mg Per Tube BID   multivitamin  15 mL Per Tube Daily   oxyCODONE  10 mg Per Tube Q6H   polyethylene glycol  17 g Oral BID   potassium chloride  40 mEq Per Tube BID   QUEtiapine  50 mg Per Tube QHS   sennosides  5 mL Oral BID   sodium chloride flush  10-40 mL Intracatheter Q12H   sodium chloride flush  3 mL Intravenous Q12H   Continuous Infusions:  sodium chloride Stopped (11/21/18 1332)   cefTAZidime (FORTAZ)  IV     feeding supplement (VITAL AF 1.2 CAL) 1,000 mL (11/21/18 1250)     LOS: 30 days   Critical care time spent: 35 minutes.  Erick BlinksJehanzeb Nishi Neiswonger, MD [facility] [email or web address] Password [email or web address] 11/21/2018, 6:18 PM

## 2018-11-21 NOTE — Progress Notes (Signed)
Pharmacy Antibiotic Note  Shannon Meyer is a 53 y.o. female admitted on 10/22/2018 with COVID-19 pneumonia.  She had foul smelling trach site secretions, elevated WBC and temp, and later developed tube feed vomiting.  Pharmacy was consulted for Zosyn dosing, narrowing today to Ceftazidime for Pseudomonas aeruginosa.  Plan:  Ceftazidime 2g IV q8h  Follow up renal function, and clinical course.   Height: 5\' 4"  (162.6 cm) Weight: 190 lb 11.2 oz (86.5 kg) IBW/kg (Calculated) : 54.7  Temp (24hrs), Avg:98.9 F (37.2 C), Min:98.5 F (36.9 C), Max:99.8 F (37.7 C)  Recent Labs  Lab 11/16/18 0330 11/18/18 0745 11/19/18 0200 11/20/18 0400 11/21/18 0445  WBC 14.7* 14.7* 18.8* 17.0* 20.5*  CREATININE 0.46 0.48 0.51 0.59 0.60    Estimated Creatinine Clearance: 84.5 mL/min (by C-G formula based on SCr of 0.6 mg/dL).    No Known Allergies  Antimicrobials this admission: 5/20 Azith >> 5/24 5/20 CTX >> 5/26 5/20 Vit C/ Zinc >>6/1 5/20 Actemra 5/21 Actemra 5/21 Remdesivir >> 5/30 5/30 Fluconazole >> 6/1 6/17 Zosyn >> 6/19 6/17 Vanc >> 6/18 6/19 Ceftazidime >>   Microbiology results: 5/20 SARS Coronavirus 2:  positive 5/20 BCx: 1/2 bottles CNS      5/22 BCID:  coag neg staph species. 5/23 MRSA PCR: neg 6/17 TA:  few Pseudomonas aeruginosa (pan-sens to all tested)  Thank you for allowing pharmacy to be a part of this patient's care.  Gretta Arab PharmD, BCPS Clinical Pharmacist Clinical pharmacist phone 7am- 5pm: [phone] 11/21/2018 1:24 PM

## 2018-11-21 NOTE — Progress Notes (Signed)
Called and spoke with patient's daughter, Yoona Ishii and provided full update/answered all questions.

## 2018-11-22 DIAGNOSIS — Z978 Presence of other specified devices: Secondary | ICD-10-CM

## 2018-11-22 LAB — COMPREHENSIVE METABOLIC PANEL
ALT: 53 U/L — ABNORMAL HIGH (ref 0–44)
AST: 48 U/L — ABNORMAL HIGH (ref 15–41)
Albumin: 3 g/dL — ABNORMAL LOW (ref 3.5–5.0)
Alkaline Phosphatase: 182 U/L — ABNORMAL HIGH (ref 38–126)
Anion gap: 12 (ref 5–15)
BUN: 13 mg/dL (ref 6–20)
CO2: 27 mmol/L (ref 22–32)
Calcium: 8.8 mg/dL — ABNORMAL LOW (ref 8.9–10.3)
Chloride: 100 mmol/L (ref 98–111)
Creatinine, Ser: 0.55 mg/dL (ref 0.44–1.00)
GFR calc Af Amer: >60 mL/min (ref >60)
GFR calc non Af Amer: >60 mL/min (ref >60)
Glucose, Bld: 111 mg/dL — ABNORMAL HIGH (ref 70–99)
Potassium: 3.8 mmol/L (ref 3.5–5.1)
Sodium: 139 mmol/L (ref 135–145)
Total Bilirubin: 0.4 mg/dL (ref 0.3–1.2)
Total Protein: 6.3 g/dL — ABNORMAL LOW (ref 6.5–8.1)

## 2018-11-22 LAB — POCT I-STAT 7, (LYTES, BLD GAS, ICA,H+H)
Acid-Base Excess: 2 mmol/L (ref 0.0–2.0)
Bicarbonate: 27.1 mmol/L (ref 20.0–28.0)
Calcium, Ion: 1.21 mmol/L (ref 1.15–1.40)
HCT: 40 % (ref 36.0–46.0)
Hemoglobin: 13.6 g/dL (ref 12.0–15.0)
O2 Saturation: 92 %
Patient temperature: 99.2
Potassium: 3.6 mmol/L (ref 3.5–5.1)
Sodium: 137 mmol/L (ref 135–145)
TCO2: 28 mmol/L (ref 22–32)
pCO2 arterial: 41.5 mmHg (ref 32.0–48.0)
pH, Arterial: 7.424 (ref 7.350–7.450)
pO2, Arterial: 65 mmHg — ABNORMAL LOW (ref 83.0–108.0)

## 2018-11-22 LAB — GLUCOSE, CAPILLARY
Glucose-Capillary: 103 mg/dL — ABNORMAL HIGH (ref 70–99)
Glucose-Capillary: 104 mg/dL — ABNORMAL HIGH (ref 70–99)
Glucose-Capillary: 112 mg/dL — ABNORMAL HIGH (ref 70–99)
Glucose-Capillary: 113 mg/dL — ABNORMAL HIGH (ref 70–99)
Glucose-Capillary: 118 mg/dL — ABNORMAL HIGH (ref 70–99)
Glucose-Capillary: 128 mg/dL — ABNORMAL HIGH (ref 70–99)

## 2018-11-22 LAB — CBC
HCT: 39.4 % (ref 36.0–46.0)
Hemoglobin: 12.1 g/dL (ref 12.0–15.0)
MCH: 29.4 pg (ref 26.0–34.0)
MCHC: 30.7 g/dL (ref 30.0–36.0)
MCV: 95.6 fL (ref 80.0–100.0)
Platelets: 295 10*3/uL (ref 150–400)
RBC: 4.12 MIL/uL (ref 3.87–5.11)
RDW: 17.7 % — ABNORMAL HIGH (ref 11.5–15.5)
WBC: 17.2 10*3/uL — ABNORMAL HIGH (ref 4.0–10.5)
nRBC: 0.2 % (ref 0.0–0.2)

## 2018-11-22 MED ORDER — QUETIAPINE FUMARATE 25 MG PO TABS
50.0000 mg | ORAL_TABLET | Freq: Every evening | ORAL | Status: DC | PRN
  Administered 2018-11-23 – 2018-12-07 (×10): 50 mg
  Filled 2018-11-22 (×3): qty 2
  Filled 2018-11-22 (×3): qty 1
  Filled 2018-11-22 (×5): qty 2

## 2018-11-22 MED ORDER — CLONAZEPAM 0.5 MG PO TBDP
0.5000 mg | ORAL_TABLET | Freq: Every evening | ORAL | Status: DC | PRN
  Administered 2018-11-24 – 2018-11-26 (×2): 0.5 mg
  Filled 2018-11-22 (×2): qty 1

## 2018-11-22 MED ORDER — POLYETHYLENE GLYCOL 3350 17 G PO PACK: 17.0000 g | PACK | Freq: Every day | ORAL | Status: DC | PRN

## 2018-11-22 NOTE — Progress Notes (Signed)
PROGRESS NOTE  Shannon BradfordDeborah Murtaugh  XBJ:[id number] DOB: 12/17/1965 DOA: 10/22/2018 PCP: No primary care provider on file.   Brief Narrative: Shannon Meyer is a 53 y.o. female with no significant medical history who presented with cough, weakness and shortness of breath found to have covid-19 with bilateral CXR opacities, intubated in ED and [facility].   Assessment & Plan: Principal Problem:   COVID-19 Active Problems:   Leukocytosis   Sepsis (HCC)   Elevated LFTs   QT prolongation   Acute respiratory failure (HCC)   Pressure injury of skin   Tracheostomy status (HCC)  Acute hypoxic respiratory failure due to covid-19 pneumonia/HCAP secondary to Pseudomonas: Failed extubation 6/8. - Completed remdesivir 5/21 - 5/30, received actemra 5/20, and 5/21. Received CTX 5/20-5/26, azithromycin 5/20-5/24. -She was noted to have foul-smelling sputum and mild fevers.  Respiratory culture positive for Pseudomonas.  Initially treated with Zosyn, but based on culture sensitivities was changed to ceftazidime.   - Maintain euvolemia/net negative.  - Avoid NSAIDs  - Continue trach collar.  - Diurese as able.  ICU delirium:  -Patient was previously on Precedex which has since been weaned off.  She was started on twice daily clonazepam, twice daily Seroquel, scheduled oxycodone but remained quite somnolent during the day.  Her morning dose of clonazepam and Seroquel were discontinued and nightly doses will be changed to only as needed.  She appears to be less drowsy today.  Following more commands.  Continue current treatments  Sinus tachycardia: 130's - 160's.  Appears to have been persistent through her hospital stay.  Pulmonary embolus has been ruled out by CTA of the chest.  Since fevers have improved, heart rate has mildly improved. - Will give metoprolol and continue monitoring.  - TSH in normal range  Hypernatremia: Resolved. - Continue free water  Hypokalemia:  - Supplement and monitor  Positive blood cultures: 1 of 4 bottles, suspect CoNS contaminant.   Obesity: BMI 34.  - Optimize nutritional status  DVT prophylaxis: Lovenox Code Status: Full Family Communication: left VM for daughter on 6/20 Disposition Plan: Remain in ICU. Therapy recommending CIR eventual disposition.   Consultants:   PCCM  Procedures:   5/20 ETT  5/24 R PICC line  6/11 Tracheostomy  Antimicrobials:  Azithromycin 5/20 >> 5/24  Rocephin 5/20 >> 5/26  Remdesivir 5/25 >> 5/30  Actemra 5/20 and 5/21  Diflucan 5/30 >> 6/1   Subjective: She is somnolent today.  Tolerating tube feeds.  No vomiting.  She is having bowel movements.  Objective: Vitals:   11/22/18 1400 11/22/18 1500 11/22/18 1526 11/22/18 1600  BP: 133/80 (!) 151/73  132/65  Pulse: (!) 109 (!) 111  (!) 106  Resp: (!) 36 (!) 39  (!) 43  Temp:   98.5 F (36.9 C)   TempSrc:   Oral   SpO2: 95% 95%  100%  Weight:      Height:        Intake/Output Summary (Last 24 hours) at 11/22/2018 1718 Last data filed at 11/22/2018 1600 Gross per 24 hour  Intake 1829.54 ml  Output 950 ml  Net 879.54 ml   Filed Weights   11/18/18 0500 11/19/18 0500 11/21/18 0448  Weight: 88.6 kg 88.6 kg 86.5 kg   General exam: Somnolent, briefly wakes up to voice but does not answer any questions Respiratory system: Clear to auscultation. Respiratory effort normal. Cardiovascular system: Tachycardic.  No murmurs, rubs, gallops. Gastrointestinal system: Abdomen is nondistended, soft and nontender. No organomegaly or  masses felt. Normal bowel sounds heard. Central nervous system: Moving all 4 extremities spontaneously. Extremities: No C/C/E, +pedal pulses Skin: No rashes, lesions or ulcers Psychiatry: Somnolent   Data Reviewed: I have personally reviewed following labs and imaging studies  CBC: Recent Labs  Lab 11/18/18 0745 11/19/18 0200 11/20/18 0400 11/21/18 0445 11/22/18 0115  WBC 14.7* 18.8* 17.0* 20.5* 17.2*  NEUTROABS  10.0* 14.3*  --   --   --   HGB 11.6* 11.7* 10.8* 11.4* 12.1  HCT 37.4 39.7 37.1 38.9 39.4  MCV 96.6 97.3 98.7 98.0 95.6  PLT 231 260 272 337 779   Basic Metabolic Panel: Recent Labs  Lab 11/18/18 0745 11/19/18 0200 11/20/18 0400 11/21/18 0445 11/22/18 0115  NA 141 140 143 141 139  K 4.4 4.4 4.3 3.5 3.8  CL 100 100 103 99 100  CO2 30 31 34* 27 27  GLUCOSE 134* 158* 65* 103* 111*  BUN 22* 23* 29* 19 13  CREATININE 0.48 0.51 0.59 0.60 0.55  CALCIUM 9.0 9.1 9.0 8.8* 8.8*   GFR: Estimated Creatinine Clearance: 84.5 mL/min (by C-G formula based on SCr of 0.55 mg/dL). Liver Function Tests: Recent Labs  Lab 11/18/18 0745 11/19/18 0200 11/20/18 0400 11/21/18 0445 11/22/18 0115  AST 39 42* 77* 50* 48*  ALT 43 43 63* 59* 53*  ALKPHOS 158* 152* 172* 180* 182*  BILITOT 0.4 0.4 0.3 0.5 0.4  PROT 6.4* 6.1* 6.0* 6.3* 6.3*  ALBUMIN 3.1* 2.9* 2.7* 3.0* 3.0*   No results for input(s): LIPASE, AMYLASE in the last 168 hours. No results for input(s): AMMONIA in the last 168 hours. Coagulation Profile: No results for input(s): INR, PROTIME in the last 168 hours. Cardiac Enzymes: No results for input(s): CKTOTAL, CKMB, CKMBINDEX, TROPONINI in the last 168 hours. BNP (last 3 results) No results for input(s): PROBNP in the last 8760 hours. HbA1C: No results for input(s): HGBA1C in the last 72 hours. CBG: Recent Labs  Lab 11/22/18 0037 11/22/18 0630 11/22/18 0751 11/22/18 1146 11/22/18 1529  GLUCAP 128* 112* 104* 103* 113*   Lipid Profile: No results for input(s): CHOL, HDL, LDLCALC, TRIG, CHOLHDL, LDLDIRECT in the last 72 hours. Thyroid Function Tests: No results for input(s): TSH, T4TOTAL, FREET4, T3FREE, THYROIDAB in the last 72 hours. Anemia Panel: No results for input(s): VITAMINB12, FOLATE, FERRITIN, TIBC, IRON, RETICCTPCT in the last 72 hours. Urine analysis:    Component Value Date/Time   COLORURINE STRAW (A) 11/01/2018 0901   APPEARANCEUR HAZY (A) 11/01/2018 0901    LABSPEC 1.010 11/01/2018 0901   PHURINE 5.0 11/01/2018 0901   GLUCOSEU NEGATIVE 11/01/2018 0901   HGBUR NEGATIVE 11/01/2018 0901   BILIRUBINUR NEGATIVE 11/01/2018 0901   KETONESUR NEGATIVE 11/01/2018 0901   PROTEINUR NEGATIVE 11/01/2018 0901   NITRITE NEGATIVE 11/01/2018 0901   LEUKOCYTESUR NEGATIVE 11/01/2018 0901   Recent Results (from the past 240 hour(s))  Culture, respiratory (non-expectorated)     Status: None   Collection Time: 11/19/18 10:59 AM   Specimen: Tracheal Aspirate; Respiratory  Result Value Ref Range Status   Specimen Description   Final    TRACHEAL ASPIRATE Performed at [facility] [address], [place], [place] [address]    Special Requests   Final    NONE Performed at [facility], [place] [address], [place], [place] [address]    Gram Stain   Final    MODERATE WBC PRESENT,BOTH PMN AND MONONUCLEAR FEW GRAM NEGATIVE RODS FEW GRAM POSITIVE COCCI RARE GRAM VARIABLE  ROD RARE YEAST Performed at [facility], [address] [place], [place] [address]    Culture FEW PSEUDOMONAS AERUGINOSA  Final   Report Status 11/21/2018 FINAL  Final   Organism ID, Bacteria PSEUDOMONAS AERUGINOSA  Final      Susceptibility   Pseudomonas aeruginosa - MIC*    CEFTAZIDIME 4 SENSITIVE Sensitive     CIPROFLOXACIN <=0.25 SENSITIVE Sensitive     GENTAMICIN <=1 SENSITIVE Sensitive     IMIPENEM <=0.25 SENSITIVE Sensitive     PIP/TAZO <=4 SENSITIVE Sensitive     CEFEPIME <=1 SENSITIVE Sensitive     * FEW PSEUDOMONAS AERUGINOSA      Radiology Studies: Dg Abd Portable 1v  Result Date: 11/21/2018 CLINICAL DATA:  NG tube placement. EXAM: PORTABLE ABDOMEN - 1 VIEW COMPARISON:  Plain films of the abdomen dated 11/20/2018 and 11/19/2018. FINDINGS: Weighted tip feeding tube now appears appropriately positioned with tip in the proximal duodenum. Visualized bowel gas pattern is nonobstructive. IMPRESSION: Weighted tip feeding tube now appears  appropriately positioned with tip in the proximal duodenum. Electronically Signed   By: Bary RichardStan  Maynard M.D.   On: 11/21/2018 12:24   Dg Abd Portable 1v  Result Date: 11/20/2018 CLINICAL DATA:  Encounter for NG tube placement EXAM: PORTABLE ABDOMEN - 1 VIEW COMPARISON:  11/19/2018 FINDINGS: Feeding tube coils in the stomach with the tip in the fundus. Nonobstructive bowel gas pattern. No organomegaly or free air. IMPRESSION: Feeding tube coils in the stomach with the tip in the fundus. Electronically Signed   By: Charlett NoseKevin  Dover M.D.   On: 11/20/2018 19:57    Scheduled Meds: . chlorhexidine  15 mL Mouth/Throat BID  . Chlorhexidine Gluconate Cloth  6 each Topical Daily  . famotidine  20 mg Oral BID  . furosemide  40 mg Per Tube Daily  . heparin injection (subcutaneous)  7,500 Units Subcutaneous Q8H  . insulin aspart  0-9 Units Subcutaneous Q4H  . mouth rinse  15 mL Mouth Rinse 10 times per day  . metoprolol tartrate  25 mg Per Tube BID  . multivitamin  15 mL Per Tube Daily  . oxyCODONE  5 mg Per Tube Q6H  . potassium chloride  40 mEq Per Tube BID  . sennosides  5 mL Oral BID  . sodium chloride flush  10-40 mL Intracatheter Q12H  . sodium chloride flush  3 mL Intravenous Q12H   Continuous Infusions: . sodium chloride 10 mL/hr at 11/22/18 1600  . cefTAZidime (FORTAZ)  IV Stopped (11/22/18 1328)  . feeding supplement (VITAL AF 1.2 CAL) 40 mL/hr at 11/22/18 0935     LOS: 31 days   Critical care time spent: 35 minutes.  Erick BlinksJehanzeb Irwin Toran, MD [facility] [email or web address] Password [email or web address] 11/22/2018, 5:18 PM

## 2018-11-22 NOTE — Progress Notes (Signed)
Patients daughter Graclyn Lawther called and updated. Questions answered regarding vitals, scheduled/PRN meds changes (Metoprolol, Oxycodone, Klonopin, and Seroquel), patients tube feeding tolerance, toileting frequency, and Physical Therapy. No further questions at this time.

## 2018-11-22 NOTE — Progress Notes (Signed)
Spoke with pt's sister Madaline Savage and gave her updates. She was very appreciative of our care. Shaquel Chavous, Rande Brunt, RN

## 2018-11-22 NOTE — Progress Notes (Signed)
Assisted tele visit to patient with family.  Hebe Merriwether P, RN  

## 2018-11-22 NOTE — Progress Notes (Signed)
NAME:  Shannon Meyer, MRN:  [id number], DOB:  12/21/1965, LOS: 31 ADMISSION DATE:  10/22/2018, CONSULTATION DATE:  10/22/2018 REFERRING MD:  Pilar PlateBero, CHIEF COMPLAINT:  Dyspnea   Brief History   53 y/o female with no PMH admitted [facility] Oct [facility], 2018 due to ARDS due to COVID 19 pneumonia.  Past Medical History  None  Significant Hospital Events   5/20 Intubated in ED and [facility] ICU 5/21-5/26 Weaning FIO2 and PEEP, diuresing 5/26-6/3 Tolerating weaning for 1-2 hours 6/7 weaning on pressure support ventilation again this morning 10/5 6/8 extubated, reintubated (tachypnea, tachycardia) 6/12 Trached 6/13 Trach collar all day  6/14 bleeding from tracheostomy site 6/17 48 hours off vent, fever > pseudomonas pneumonia 6/20 slightly more awake, remains off vent  Consults:  Pulmonary and critical care medicine  Procedures:  5/20 ET tube>6/11  Trach 6/11 >> 6/7 PICC line right arm>  Significant Diagnostic Tests:  Oct 28, 2018 CT angiogram chest no pulmonary embolism, bilateral atypical pneumonia consistent with COVID-19  Micro Data:  SARS-CoV-2 May 20+ Blood culture May 20 1 out of 2 GPC staph, coag negative resp 6/17 > pseudomonas aeruginosa  Antimicrobials:  Zithromax 5/20 >>5/24 Rocephin 5/20 >>5/26 Remdesivir 5/21 >5/30 Actemra 5/20>5/21 Solumedrol 5/12  > 5/26   Vanc 6/17 > x1 Zosyn 6/17 > 6/19 Ceftaz 6/17 >   Interim history/subjective:   Remains off of ventilator Still intermittently tachycardic and tachypneic More awake  Objective   Blood pressure (!) 175/86, pulse (!) 109, temperature 98.6 F (37 C), temperature source Axillary, resp. rate (!) 50, height 5\' 4"  (1.626 m), weight 86.5 kg, SpO2 98 %.        Intake/Output Summary (Last 24 hours) at 11/22/2018 0754 Last data filed at 11/22/2018 0100 Gross per 24 hour  Intake 618.78 ml  Output 725 ml  Net -106.22 ml   Filed Weights   11/18/18 0500 11/19/18 0500 11/21/18  0448  Weight: 88.6 kg 88.6 kg 86.5 kg    Examination:  General:  Resting comfortably in bed HENT: NCAT trach site clean, no bleeding PULM: Tachypnea but no accessory muscle use CV: RRR, no mgr GI: BS+, soft, nontender MSK: normal bulk and tone Neuro: drowsy but will wake up, follow commands   6/17 CXR > worsening infiltrates bilatearlly 6/19 Abdominal X-ray> NG likely kinked  Resolved Hospital Problem list     Assessment & Plan:  ARDS due to COVID-19 pneumonia Continue to maintain off of ventilator today Tracheostomy care per routine Monitor respiratory status in ICU setting again considering drowsy mental status Continue to monitor O2 saturation, target greater than 88% Mobilize, physical therapy, Occupational Therapy   Delirium/ICU encephalopathy: improving Continue to wean sedatives Frequent orientation Out of bed  Possible HCAP: worsening infiltrates, WBC up, increased secretions Pseudomonas pneumonia Agree with change to ceftaz   Severe physical deconditioning: Physical therapy/Occupational Therapy consult  Best practice:  Diet: Tube feeding Pain/Anxiety/Delirium protocol (if indicated): RASS goal 0 VAP protocol (if indicated): Yes DVT prophylaxis: SCDs GI prophylaxis: Famotidine Glucose control: SSI Mobility: PT consult Code Status: Full Family Communication: will contact today Disposition: remain in ICU  Labs   CBC: Recent Labs  Lab 11/18/18 0745 11/19/18 0200 11/20/18 0400 11/21/18 0445 11/22/18 0115  WBC 14.7* 18.8* 17.0* 20.5* 17.2*  NEUTROABS 10.0* 14.3*  --   --   --   HGB 11.6* 11.7* 10.8* 11.4* 12.1  HCT 37.4 39.7 37.1 38.9 39.4  MCV 96.6 97.3 98.7 98.0 95.6  PLT 231 260 272 337 884    Basic Metabolic Panel: Recent Labs  Lab 11/18/18 0745 11/19/18 0200 11/20/18 0400 11/21/18 0445 11/22/18 0115  NA 141 140 143 141 139  K 4.4 4.4 4.3 3.5 3.8  CL 100 100 103 99 100  CO2 30 31 34* 27 27  GLUCOSE 134* 158* 65* 103* 111*  BUN  22* 23* 29* 19 13  CREATININE 0.48 0.51 0.59 0.60 0.55  CALCIUM 9.0 9.1 9.0 8.8* 8.8*   GFR: Estimated Creatinine Clearance: 84.5 mL/min (by C-G formula based on SCr of 0.55 mg/dL). Recent Labs  Lab 11/19/18 0200 11/20/18 0400 11/21/18 0445 11/22/18 0115  WBC 18.8* 17.0* 20.5* 17.2*    Liver Function Tests: Recent Labs  Lab 11/18/18 0745 11/19/18 0200 11/20/18 0400 11/21/18 0445 11/22/18 0115  AST 39 42* 77* 50* 48*  ALT 43 43 63* 59* 53*  ALKPHOS 158* 152* 172* 180* 182*  BILITOT 0.4 0.4 0.3 0.5 0.4  PROT 6.4* 6.1* 6.0* 6.3* 6.3*  ALBUMIN 3.1* 2.9* 2.7* 3.0* 3.0*   No results for input(s): LIPASE, AMYLASE in the last 168 hours. No results for input(s): AMMONIA in the last 168 hours.  ABG    Component Value Date/Time   PHART 7.432 11/10/2018 1722   PCO2ART 36.0 11/10/2018 1722   PO2ART 77.0 (L) 11/10/2018 1722   HCO3 23.9 11/10/2018 1722   TCO2 25 11/10/2018 1722   ACIDBASEDEF 1.0 10/24/2018 0419   O2SAT 95.0 11/10/2018 1722     Coagulation Profile: No results for input(s): INR, PROTIME in the last 168 hours.  Cardiac Enzymes: No results for input(s): CKTOTAL, CKMB, CKMBINDEX, TROPONINI in the last 168 hours.  HbA1C: No results found for: HGBA1C  CBG: Recent Labs  Lab 11/21/18 0742 11/21/18 1221 11/21/18 1648 11/22/18 0037 11/22/18 0630  GLUCAP 88 104* 98 128* 112*    Critical care time: n/a   Roselie Awkward, MD [place] [facility] Pager: [phone] Cell: [phone] If no response, call [phone]

## 2018-11-22 NOTE — Progress Notes (Signed)
Rt obtained ABG due to O2 sat not correlating on ear/hand.  No gas obtained in 12 days.  Result for accurate  PaO2 reading.

## 2018-11-22 NOTE — Progress Notes (Signed)
Patient oriented to place being hospital but disoriented to specific hospital or city she is in. Patient states [place]. Patient oriented to month but disoriented to year (stating 2002). Patient disoriented to president stating [facility]).

## 2018-11-23 LAB — GLUCOSE, CAPILLARY
Glucose-Capillary: 131 mg/dL — ABNORMAL HIGH (ref 70–99)
Glucose-Capillary: 149 mg/dL — ABNORMAL HIGH (ref 70–99)
Glucose-Capillary: 134 mg/dL — ABNORMAL HIGH (ref 70–99)
Glucose-Capillary: 125 mg/dL — ABNORMAL HIGH (ref 70–99)
Glucose-Capillary: 134 mg/dL — ABNORMAL HIGH (ref 70–99)

## 2018-11-23 LAB — CBC
HCT: 41.4 % (ref 36.0–46.0)
Hemoglobin: 12.7 g/dL (ref 12.0–15.0)
MCH: 29.2 pg (ref 26.0–34.0)
MCHC: 30.7 g/dL (ref 30.0–36.0)
MCV: 95.2 fL (ref 80.0–100.0)
Platelets: 422 10*3/uL — ABNORMAL HIGH (ref 150–400)
RBC: 4.35 MIL/uL (ref 3.87–5.11)
RDW: 17.7 % — ABNORMAL HIGH (ref 11.5–15.5)
WBC: 21.3 10*3/uL — ABNORMAL HIGH (ref 4.0–10.5)
nRBC: 0.3 % — ABNORMAL HIGH (ref 0.0–0.2)

## 2018-11-23 LAB — COMPREHENSIVE METABOLIC PANEL
ALT: 54 U/L — ABNORMAL HIGH (ref 0–44)
AST: 54 U/L — ABNORMAL HIGH (ref 15–41)
Albumin: 3.1 g/dL — ABNORMAL LOW (ref 3.5–5.0)
Alkaline Phosphatase: 190 U/L — ABNORMAL HIGH (ref 38–126)
Anion gap: 10 (ref 5–15)
BUN: 12 mg/dL (ref 6–20)
CO2: 28 mmol/L (ref 22–32)
Calcium: 9.1 mg/dL (ref 8.9–10.3)
Chloride: 101 mmol/L (ref 98–111)
Creatinine, Ser: 0.51 mg/dL (ref 0.44–1.00)
GFR calc Af Amer: >60 mL/min (ref >60)
GFR calc non Af Amer: >60 mL/min (ref >60)
Glucose, Bld: 103 mg/dL — ABNORMAL HIGH (ref 70–99)
Potassium: 4 mmol/L (ref 3.5–5.1)
Sodium: 139 mmol/L (ref 135–145)
Total Bilirubin: 0.3 mg/dL (ref 0.3–1.2)
Total Protein: 7.1 g/dL (ref 6.5–8.1)

## 2018-11-23 MED ORDER — FUROSEMIDE 10 MG/ML IJ SOLN
40.0000 mg | Freq: Three times a day (TID) | INTRAMUSCULAR | Status: AC
  Administered 2018-11-23 (×2): 40 mg via INTRAVENOUS
  Filled 2018-11-23 (×2): qty 4

## 2018-11-23 MED ORDER — POTASSIUM CHLORIDE 20 MEQ/15ML (10%) PO SOLN
40.0000 meq | Freq: Three times a day (TID) | ORAL | Status: AC
  Administered 2018-11-23 (×2): 40 meq
  Filled 2018-11-23: qty 30

## 2018-11-23 MED ORDER — METOPROLOL TARTRATE 25 MG/10 ML ORAL SUSPENSION
50.0000 mg | Freq: Two times a day (BID) | ORAL | Status: DC
  Administered 2018-11-23 – 2018-11-29 (×13): 50 mg
  Filled 2018-11-23 (×18): qty 20

## 2018-11-23 MED ORDER — SENNOSIDES 8.8 MG/5ML PO SYRP: 5.0000 mL | ORAL_SOLUTION | Freq: Every evening | ORAL | Status: DC | PRN

## 2018-11-23 NOTE — Progress Notes (Signed)
Patients sister who lives in [place] called seeking an update. Update provided.

## 2018-11-23 NOTE — Progress Notes (Signed)
Occupational Therapy Treatment Patient Details Name: Shannon BradfordDeborah Meyer MRN: [id number] DOB: 11/17/1965 Today's Date: 11/23/2018    History of present illness Pt adm 5/20 with hypoxic resp failure due to Covid 19. Pt intubated 5/20 and extubated 6/8, with re-intubation 6/8; Trach 6/11. PMH - none. 6/17 foul smelling trach site/secretions, elevated WBC and temp; vomited tube feeds.   OT comments  Pt more alert today and able to participate with OT @ bed level. Consistently following commands. Pt able to indicate she needed to urinate and assisted in bridging to place clean pad. Pt impulsive and restless but demonstrated increased sustained attention to task. Used a built up marker to begin communicating with dry erase board. Pt able to write her and, her daughter's name and her daughter's age. Not oriented to place ([place] [facility] [place] [facility] Will plan to mobilize to EOB and possibly OOB to chair if able to tolerate. Pt will likely need a posey belt if we mobilize to chair.  Encourage nsg to place bed in chair position TID, blinds open during the day if possible and frequently reorient to help reduce ICU delirium.   Follow Up Recommendations  CIR;Supervision/Assistance - 24 hour    Equipment Recommendations  3 in 1 bedside commode;Other (comment)    Recommendations for Other Services Rehab consult    Precautions / Restrictions Precautions Precautions: Fall Precaution Comments: t-bar, multiple lines; coretrack Restrictions Weight Bearing Restrictions: No       Mobility Bed Mobility Overal bed mobility: Needs Assistance Bed Mobility: Rolling Rolling: Min guard;+2 for safety/equipment         General bed mobility comments: placed in seated position  Transfers                      Balance                                           ADL either performed or assessed with clinical judgement   ADL Overall ADL's : Needs assistance/impaired Eating/Feeding:  NPO   Grooming: Moderate assistance;Bed level Grooming Details (indicate cue type and reason): Able to wash her face                             Functional mobility during ADLs: +2 for physical assistance;Minimal assistance(with bed mobility; pt bridging to assist with pad placement) General ADL Comments: Increased ability to participate with ADL. Pt stating she had a "pee". Pt appropriately applying pressure to purewick to pee. Pt appeared restless. Aked pt if she felt she was wet adn she responded yes. Pt able to bridge adn assist with rolling side - side to place clean pad.     Vision       Perception     Praxis      Cognition Arousal/Alertness: Awake/alert Behavior During Therapy: Restless;Flat affect;Impulsive Overall Cognitive Status: Impaired/Different from baseline Area of Impairment: Orientation;Attention;Memory;Following commands;Safety/judgement;Awareness;Problem solving                 Orientation Level: Disoriented to;Place;Time Current Attention Level: Focused;Sustained Memory: Decreased short-term memory Following Commands: Follows one step commands consistently Safety/Judgement: Decreased awareness of safety;Decreased awareness of deficits Awareness: Intellectual Problem Solving: Slow processing;Requires verbal cues          Exercises Exercises: General Upper Extremity General Exercises - Upper Extremity Shoulder Flexion: AAROM;Both;10  reps;Seated Shoulder ABduction: AAROM;Both;10 reps;Seated Elbow Flexion: AAROM;Both;10 reps;Seated;Supine Elbow Extension: AROM;Both;10 reps;Seated;Supine Digit Composite Flexion: AROM;Both;10 reps Other Exercises Other Exercises: bed moved into chair position   Shoulder Instructions       General Comments      Pertinent Vitals/ Pain       Pain Assessment: Faces Faces Pain Scale: Hurts a little bit Pain Location: generalized Pain Descriptors / Indicators: Discomfort;Grimacing Pain  Intervention(s): Limited activity within patient's tolerance  Home Living                                          Prior Functioning/Environment              Frequency  Min 3X/week        Progress Toward Goals  OT Goals(current goals can now be found in the care plan section)  Progress towards OT goals: Progressing toward goals  Acute Rehab OT Goals Patient Stated Goal: none stated OT Goal Formulation: Patient unable to participate in goal setting Time For Goal Achievement: 12/01/18 Potential to Achieve Goals: Good ADL Goals Pt Will Perform Grooming: with min assist;sitting Pt Will Transfer to Toilet: stand pivot transfer;bedside commode;with mod assist Pt Will Perform Toileting - Clothing Manipulation and hygiene: with mod assist;sit to/from stand;sitting/lateral leans Additional ADL Goal #1: Pt will perform bed mobility with Min A in preparation for ADLs Additional ADL Goal #2: Pt will tolerate sitting at EOB for 10 minutes with Min Guard A in preparation for ADLs Additional ADL Goal #3: Pt will demonstrate selective attention during ADL in a distracting environement with Min cues  Plan Discharge plan remains appropriate    Co-evaluation                 AM-PAC OT "6 Clicks" Daily Activity     Outcome Measure   Help from another person eating meals?: Total Help from another person taking care of personal grooming?: A Lot Help from another person toileting, which includes using toliet, bedpan, or urinal?: A Lot Help from another person bathing (including washing, rinsing, drying)?: A Lot Help from another person to put on and taking off regular upper body clothing?: Total Help from another person to put on and taking off regular lower body clothing?: Total 6 Click Score: 9    End of Session Equipment Utilized During Treatment: Oxygen  OT Visit Diagnosis: Unsteadiness on feet (R26.81);Other abnormalities of gait and mobility  (R26.89);Muscle weakness (generalized) (M62.81);Other symptoms and signs involving cognitive function Pain - part of body: (generalized discomfort)   Activity Tolerance Patient tolerated treatment well   Patient Left in bed;with call bell/phone within reach;with nursing/sitter in room;with restraints reapplied(chair position)   Nurse Communication Mobility status;Other (comment)(use of dry erase board for communication)        Time: 7858-8502 OT Time Calculation (min): 20 min  Charges: OT General Charges $OT Visit: 1 Visit OT Treatments $Self Care/Home Management : 8-22 mins  Maurie Boettcher, OT/L   Acute OT Clinical Specialist [place] Pager [phone] Office [phone]    [facility] 11/23/2018, 11:32 AM

## 2018-11-23 NOTE — Progress Notes (Signed)
PROGRESS NOTE  Shannon BradfordDeborah Meyer  WUJ:[id number] DOB: 03/12/1966 DOA: 10/22/2018 PCP: No primary care provider on file.   Brief Narrative: Shannon BradfordDeborah Meyer is a 53 y.o. female with no significant medical history who presented with cough, weakness and shortness of breath found to have covid-19 with bilateral CXR opacities, intubated in ED and [facility].   Assessment & Plan: Principal Problem:   COVID-19 Active Problems:   Leukocytosis   Sepsis (HCC)   Elevated LFTs   QT prolongation   Acute respiratory failure (HCC)   Pressure injury of skin   Tracheostomy status (HCC)  Acute hypoxic respiratory failure due to covid-19 pneumonia/HCAP secondary to Pseudomonas: Failed extubation 6/8. - Completed remdesivir 5/21 - 5/30, received actemra 5/20, and 5/21. Received CTX 5/20-5/26, azithromycin 5/20-5/24. -She was noted to have foul-smelling sputum and mild fevers.  Respiratory culture positive for Pseudomonas.  Initially treated with Zosyn, but based on culture sensitivities was changed to ceftazidime.   - Maintain euvolemia/net negative.  - Avoid NSAIDs  - Continue trach collar.  - Diurese as able.  ICU delirium:  -Patient was previously on Precedex which has since been weaned off.  She was started on twice daily clonazepam, twice daily Seroquel, scheduled oxycodone but remained quite somnolent during the day.  Her morning dose of clonazepam and Seroquel were discontinued and nightly doses will be changed to only as needed.  She appears to be less drowsy today.  Following more commands.  Continue current treatments  Sinus tachycardia: 130's - 160's.  Appears to have been persistent through her hospital stay.  Pulmonary embolus has been ruled out by CTA of the chest.  Since fevers have improved, heart rate has mildly improved. - Will give metoprolol and continue monitoring.  - TSH in normal range  Hypernatremia: Resolved. - Continue free water  Hypokalemia:  - Supplement and monitor  Positive blood cultures: 1 of 4 bottles, suspect CoNS contaminant.   Obesity: BMI 34.  - Optimize nutritional status  DVT prophylaxis: Lovenox Code Status: Full Family Communication: discussed with daughter on 6/21 Disposition Plan: Remain in ICU. Therapy recommending CIR eventual disposition.   Consultants:   PCCM  Procedures:   5/20 ETT  5/24 R PICC line  6/11 Tracheostomy  Antimicrobials:  Azithromycin 5/20 >> 5/24  Rocephin 5/20 >> 5/26  Remdesivir 5/25 >> 5/30  Actemra 5/20 and 5/21  Diflucan 5/30 >> 6/1   Ceftazidime 6/19>  Subjective: She is less somnolent today.  Answers questions with yes and no answers.  Makes eye contact.  Objective: Vitals:   11/23/18 1200 11/23/18 1300 11/23/18 1400 11/23/18 1542  BP: (!) 147/95 (!) 105/58 110/78 127/70  Pulse: (!) 106 (!) 116 (!) 109 (!) 117  Resp:   (!) 40 (!) 43  Temp:      TempSrc:      SpO2: 99% 98% 93% 95%  Weight:      Height:        Intake/Output Summary (Last 24 hours) at 11/23/2018 1653 Last data filed at 11/23/2018 1400 Gross per 24 hour  Intake 1761.75 ml  Output 600 ml  Net 1161.75 ml   Filed Weights   11/18/18 0500 11/19/18 0500 11/21/18 0448  Weight: 88.6 kg 88.6 kg 86.5 kg   General exam: Alert, awake, no distress Respiratory system: Clear to auscultation. Respiratory effort normal.  Trach in place Cardiovascular system:RRR. No murmurs, rubs, gallops. Gastrointestinal system: Abdomen is nondistended, soft and nontender. No organomegaly or masses felt. Normal bowel sounds heard.  Central nervous system:  No focal neurological deficits. Extremities: No C/C/E, +pedal pulses Skin: No rashes, lesions or ulcers Psychiatry: Nonverbal from trach, appears calm, occasionally impulsive   Data Reviewed: I have personally reviewed following labs and imaging studies  CBC: Recent Labs  Lab 11/18/18 0745 11/19/18 0200 11/20/18 0400 11/21/18 0445 11/22/18 0115 11/22/18 2116 11/23/18 0245   WBC 14.7* 18.8* 17.0* 20.5* 17.2*  --  21.3*  NEUTROABS 10.0* 14.3*  --   --   --   --   --   HGB 11.6* 11.7* 10.8* 11.4* 12.1 13.6 12.7  HCT 37.4 39.7 37.1 38.9 39.4 40.0 41.4  MCV 96.6 97.3 98.7 98.0 95.6  --  95.2  PLT 231 260 272 337 295  --  101*   Basic Metabolic Panel: Recent Labs  Lab 11/19/18 0200 11/20/18 0400 11/21/18 0445 11/22/18 0115 11/22/18 2116 11/23/18 0245  NA 140 143 141 139 137 139  K 4.4 4.3 3.5 3.8 3.6 4.0  CL 100 103 99 100  --  101  CO2 31 34* 27 27  --  28  GLUCOSE 158* 65* 103* 111*  --  103*  BUN 23* 29* 19 13  --  12  CREATININE 0.51 0.59 0.60 0.55  --  0.51  CALCIUM 9.1 9.0 8.8* 8.8*  --  9.1   GFR: Estimated Creatinine Clearance: 84.5 mL/min (by C-G formula based on SCr of 0.51 mg/dL). Liver Function Tests: Recent Labs  Lab 11/19/18 0200 11/20/18 0400 11/21/18 0445 11/22/18 0115 11/23/18 0245  AST 42* 77* 50* 48* 54*  ALT 43 63* 59* 53* 54*  ALKPHOS 152* 172* 180* 182* 190*  BILITOT 0.4 0.3 0.5 0.4 0.3  PROT 6.1* 6.0* 6.3* 6.3* 7.1  ALBUMIN 2.9* 2.7* 3.0* 3.0* 3.1*   No results for input(s): LIPASE, AMYLASE in the last 168 hours. No results for input(s): AMMONIA in the last 168 hours. Coagulation Profile: No results for input(s): INR, PROTIME in the last 168 hours. Cardiac Enzymes: No results for input(s): CKTOTAL, CKMB, CKMBINDEX, TROPONINI in the last 168 hours. BNP (last 3 results) No results for input(s): PROBNP in the last 8760 hours. HbA1C: No results for input(s): HGBA1C in the last 72 hours. CBG: Recent Labs  Lab 11/22/18 2003 11/23/18 0448 11/23/18 0500 11/23/18 0841 11/23/18 1200  GLUCAP 118* 149* 131* 134* 125*   Lipid Profile: No results for input(s): CHOL, HDL, LDLCALC, TRIG, CHOLHDL, LDLDIRECT in the last 72 hours. Thyroid Function Tests: No results for input(s): TSH, T4TOTAL, FREET4, T3FREE, THYROIDAB in the last 72 hours. Anemia Panel: No results for input(s): VITAMINB12, FOLATE, FERRITIN, TIBC, IRON,  RETICCTPCT in the last 72 hours. Urine analysis:    Component Value Date/Time   COLORURINE STRAW (A) 11/01/2018 0901   APPEARANCEUR HAZY (A) 11/01/2018 0901   LABSPEC 1.010 11/01/2018 0901   PHURINE 5.0 11/01/2018 0901   GLUCOSEU NEGATIVE 11/01/2018 0901   HGBUR NEGATIVE 11/01/2018 0901   BILIRUBINUR NEGATIVE 11/01/2018 0901   KETONESUR NEGATIVE 11/01/2018 0901   PROTEINUR NEGATIVE 11/01/2018 0901   NITRITE NEGATIVE 11/01/2018 0901   LEUKOCYTESUR NEGATIVE 11/01/2018 0901   Recent Results (from the past 240 hour(s))  Culture, respiratory (non-expectorated)     Status: None   Collection Time: 11/19/18 10:59 AM   Specimen: Tracheal Aspirate; Respiratory  Result Value Ref Range Status   Specimen Description   Final    TRACHEAL ASPIRATE Performed at [facility] [address], [place], [place] [address]    Special Requests  Final    NONE Performed at [facility], [address] [place], [place] [address]    Gram Stain   Final    MODERATE WBC PRESENT,BOTH PMN AND MONONUCLEAR FEW GRAM NEGATIVE RODS FEW GRAM POSITIVE COCCI RARE GRAM VARIABLE ROD RARE YEAST Performed at [facility], [address] [place], [place] [address]    Culture FEW PSEUDOMONAS AERUGINOSA  Final   Report Status 11/21/2018 FINAL  Final   Organism ID, Bacteria PSEUDOMONAS AERUGINOSA  Final      Susceptibility   Pseudomonas aeruginosa - MIC*    CEFTAZIDIME 4 SENSITIVE Sensitive     CIPROFLOXACIN <=0.25 SENSITIVE Sensitive     GENTAMICIN <=1 SENSITIVE Sensitive     IMIPENEM <=0.25 SENSITIVE Sensitive     PIP/TAZO <=4 SENSITIVE Sensitive     CEFEPIME <=1 SENSITIVE Sensitive     * FEW PSEUDOMONAS AERUGINOSA      Radiology Studies: No results found.  Scheduled Meds: . chlorhexidine  15 mL Mouth/Throat BID  . Chlorhexidine Gluconate Cloth  6 each Topical Daily  . famotidine  20 mg Oral BID  . furosemide  40 mg Intravenous Q8H  . furosemide  40 mg Per  Tube Daily  . heparin injection (subcutaneous)  7,500 Units Subcutaneous Q8H  . insulin aspart  0-9 Units Subcutaneous Q4H  . mouth rinse  15 mL Mouth Rinse 10 times per day  . metoprolol tartrate  25 mg Per Tube BID  . multivitamin  15 mL Per Tube Daily  . oxyCODONE  5 mg Per Tube Q6H  . sodium chloride flush  10-40 mL Intracatheter Q12H  . sodium chloride flush  3 mL Intravenous Q12H   Continuous Infusions: . sodium chloride 10 mL/hr at 11/23/18 1400  . cefTAZidime (FORTAZ)  IV 2 g (11/23/18 1416)  . feeding supplement (VITAL AF 1.2 CAL) 60 mL/hr at 11/23/18 1400     LOS: 32 days   Critical care time spent: 35 minutes.  Erick BlinksJehanzeb Memon, MD [facility] [email or web address] Password [email or web address] 11/23/2018, 4:53 PM

## 2018-11-23 NOTE — Progress Notes (Signed)
NAME:  Shannon BradfordDeborah Malicki, MRN:  [id number], DOB:  11/02/1965, LOS: 32 ADMISSION DATE:  10/22/2018, CONSULTATION DATE:  10/22/2018 REFERRING MD:  Pilar PlateBero, CHIEF COMPLAINT:  Dyspnea   Brief History   53 y/o female with no PMH [facility] due to ARDS due to COVID 19 pneumonia.  Past Medical History  None  Significant Hospital Events   5/20 Intubated in ED and [facility] 5/21-5/26 Weaning FIO2 and PEEP, diuresing 5/26-6/3 Tolerating weaning for 1-2 hours 6/7 weaning on pressure support ventilation again this morning 10/5 6/8 extubated, reintubated (tachypnea, tachycardia) 6/12 Trached 6/13 Trach collar all day  6/14 bleeding from tracheostomy site 6/17 48 hours off vent, fever > pseudomonas pneumonia 6/20 slightly more awake, remains off vent  Consults:  Pulmonary and critical care medicine  Procedures:  5/20 ET tube>6/11  Trach 6/11 >> 6/7 PICC line right arm>  Significant Diagnostic Tests:  Oct 28, 2018 CT angiogram chest no pulmonary embolism, bilateral atypical pneumonia consistent with COVID-19  Micro Data:  SARS-CoV-2 May 20+ Blood culture May 20 1 out of 2 GPC staph, coag negative resp 6/17 > pseudomonas aeruginosa  Antimicrobials:  Zithromax 5/20 >>5/24 Rocephin 5/20 >>5/26 Remdesivir 5/21 >5/30 Actemra 5/20>5/21 Solumedrol 5/12  > 5/26   Vanc 6/17 > x1 Zosyn 6/17 > 6/19 Ceftaz 6/17 >   Interim history/subjective:   Decreased O2 demand overnight, off vent, no new complaints  Objective   Blood pressure (!) 162/95, pulse (!) 115, temperature 98.7 F (37.1 C), temperature source Oral, resp. rate (!) 41, height 5\' 4"  (1.626 m), weight 86.5 kg, SpO2 94 %.    FiO2 (%):  [93 %] 93 %   Intake/Output Summary (Last 24 hours) at 11/23/2018 0850 Last data filed at 11/23/2018 [id number] Gross per 24 hour  Intake 1895.96 ml  Output 900 ml  Net 995.96 ml   Filed Weights   11/18/18 0500 11/19/18 0500 11/21/18 0448   Weight: 88.6 kg 88.6 kg 86.5 kg   Examination:  General:  Well appearing, NAD HENT: [place]/AT, PERRL, EOM-I and MMM, trach is in a good position PULM: Coarse BS diffusely CV: RRR, Nl S1/S2 and -M/R/G GI: Soft, NT, ND and +BS MSK: normal bulk and tone Neuro: Awake and following commands  6/17 CXR > worsening infiltrates bilatearlly I reviewed CXR myself, trach is in a good position, infiltrate noted  Resolved Hospital Problem list     Assessment & Plan:  ARDS due to COVID-19 pneumonia Maintain off vent as able Titrate O2 for sat of 88-92% Tracheostomy care per routine Maintain in the ICU while on high O2 demand Diureses as able Continue to monitor O2 saturation, target greater than 88% Mobilize, physical therapy, Occupational Therapy  Delirium/ICU encephalopathy: improving Continue to wean sedatives Frequent orientation Out of bed  Possible HCAP: worsening infiltrates, WBC up, increased secretions Pseudomonas pneumonia Ceftaz for now  Severe physical deconditioning: Physical therapy/Occupational Therapy consult  Discussed with [facility]-MD  Best practice:  Diet: Tube feeding Pain/Anxiety/Delirium protocol (if indicated): RASS goal 0 VAP protocol (if indicated): Yes DVT prophylaxis: SCDs GI prophylaxis: Famotidine Glucose control: SSI Mobility: PT consult Code Status: Full Family Communication: will contact today Disposition: remain in ICU  Labs   CBC: Recent Labs  Lab 11/18/18 0745 11/19/18 0200 11/20/18 0400 11/21/18 0445 11/22/18 0115 11/22/18 2116 11/23/18 0245  WBC 14.7* 18.8* 17.0* 20.5* 17.2*  --  21.3*  NEUTROABS 10.0* 14.3*  --   --   --   --   --  HGB 11.6* 11.7* 10.8* 11.4* 12.1 13.6 12.7  HCT 37.4 39.7 37.1 38.9 39.4 40.0 41.4  MCV 96.6 97.3 98.7 98.0 95.6  --  95.2  PLT 231 260 272 337 295  --  422*    Basic Metabolic Panel: Recent Labs  Lab 11/19/18 0200 11/20/18 0400 11/21/18 0445 11/22/18 0115 11/22/18 2116 11/23/18 0245  NA 140  143 141 139 137 139  K 4.4 4.3 3.5 3.8 3.6 4.0  CL 100 103 99 100  --  101  CO2 31 34* 27 27  --  28  GLUCOSE 158* 65* 103* 111*  --  103*  BUN 23* 29* 19 13  --  12  CREATININE 0.51 0.59 0.60 0.55  --  0.51  CALCIUM 9.1 9.0 8.8* 8.8*  --  9.1   GFR: Estimated Creatinine Clearance: 84.5 mL/min (by C-G formula based on SCr of 0.51 mg/dL). Recent Labs  Lab 11/20/18 0400 11/21/18 0445 11/22/18 0115 11/23/18 0245  WBC 17.0* 20.5* 17.2* 21.3*    Liver Function Tests: Recent Labs  Lab 11/19/18 0200 11/20/18 0400 11/21/18 0445 11/22/18 0115 11/23/18 0245  AST 42* 77* 50* 48* 54*  ALT 43 63* 59* 53* 54*  ALKPHOS 152* 172* 180* 182* 190*  BILITOT 0.4 0.3 0.5 0.4 0.3  PROT 6.1* 6.0* 6.3* 6.3* 7.1  ALBUMIN 2.9* 2.7* 3.0* 3.0* 3.1*   No results for input(s): LIPASE, AMYLASE in the last 168 hours. No results for input(s): AMMONIA in the last 168 hours.  ABG    Component Value Date/Time   PHART 7.424 11/22/2018 2116   PCO2ART 41.5 11/22/2018 2116   PO2ART 65.0 (L) 11/22/2018 2116   HCO3 27.1 11/22/2018 2116   TCO2 28 11/22/2018 2116   ACIDBASEDEF 1.0 10/24/2018 0419   O2SAT 92.0 11/22/2018 2116     Coagulation Profile: No results for input(s): INR, PROTIME in the last 168 hours.  Cardiac Enzymes: No results for input(s): CKTOTAL, CKMB, CKMBINDEX, TROPONINI in the last 168 hours.  HbA1C: No results found for: HGBA1C  CBG: Recent Labs  Lab 11/22/18 1146 11/22/18 1529 11/22/18 2003 11/23/18 0448 11/23/18 0500  GLUCAP 103* 113* 118* 149* 131*   Rush Farmer, M.D. [facility] Pulmonary/Critical Care Medicine. Pager: [phone]. After hours pager: [phone].

## 2018-11-23 NOTE — Progress Notes (Signed)
Daughter called to check on patient before going to bed. Update provided.

## 2018-11-24 LAB — COMPREHENSIVE METABOLIC PANEL
ALT: 50 U/L — ABNORMAL HIGH (ref 0–44)
AST: 46 U/L — ABNORMAL HIGH (ref 15–41)
Albumin: 3.2 g/dL — ABNORMAL LOW (ref 3.5–5.0)
Alkaline Phosphatase: 171 U/L — ABNORMAL HIGH (ref 38–126)
Anion gap: 12 (ref 5–15)
BUN: 26 mg/dL — ABNORMAL HIGH (ref 6–20)
CO2: 27 mmol/L (ref 22–32)
Calcium: 9.4 mg/dL (ref 8.9–10.3)
Chloride: 102 mmol/L (ref 98–111)
Creatinine, Ser: 0.59 mg/dL (ref 0.44–1.00)
GFR calc Af Amer: >60 mL/min (ref >60)
GFR calc non Af Amer: >60 mL/min (ref >60)
Glucose, Bld: 98 mg/dL (ref 70–99)
Potassium: 3.8 mmol/L (ref 3.5–5.1)
Sodium: 141 mmol/L (ref 135–145)
Total Bilirubin: 0.3 mg/dL (ref 0.3–1.2)
Total Protein: 7.1 g/dL (ref 6.5–8.1)

## 2018-11-24 LAB — CBC
HCT: 41.4 % (ref 36.0–46.0)
Hemoglobin: 12.7 g/dL (ref 12.0–15.0)
MCH: 29.2 pg (ref 26.0–34.0)
MCHC: 30.7 g/dL (ref 30.0–36.0)
MCV: 95.2 fL (ref 80.0–100.0)
Platelets: 435 10*3/uL — ABNORMAL HIGH (ref 150–400)
RBC: 4.35 MIL/uL (ref 3.87–5.11)
RDW: 18.6 % — ABNORMAL HIGH (ref 11.5–15.5)
WBC: 23.9 10*3/uL — ABNORMAL HIGH (ref 4.0–10.5)
nRBC: 0.2 % (ref 0.0–0.2)

## 2018-11-24 LAB — GLUCOSE, CAPILLARY
Glucose-Capillary: 107 mg/dL — ABNORMAL HIGH (ref 70–99)
Glucose-Capillary: 119 mg/dL — ABNORMAL HIGH (ref 70–99)
Glucose-Capillary: 126 mg/dL — ABNORMAL HIGH (ref 70–99)
Glucose-Capillary: 127 mg/dL — ABNORMAL HIGH (ref 70–99)
Glucose-Capillary: 134 mg/dL — ABNORMAL HIGH (ref 70–99)
Glucose-Capillary: 90 mg/dL (ref 70–99)
Glucose-Capillary: 91 mg/dL (ref 70–99)
Glucose-Capillary: 93 mg/dL (ref 70–99)

## 2018-11-24 LAB — PHOSPHORUS: Phosphorus: 4.3 mg/dL (ref 2.5–4.6)

## 2018-11-24 LAB — MAGNESIUM: Magnesium: 2.5 mg/dL — ABNORMAL HIGH (ref 1.7–2.4)

## 2018-11-24 MED ORDER — FUROSEMIDE 10 MG/ML IJ SOLN
40.0000 mg | Freq: Once | INTRAMUSCULAR | Status: AC
  Administered 2018-11-24: 40 mg via INTRAVENOUS
  Filled 2018-11-24: qty 4

## 2018-11-24 NOTE — Progress Notes (Signed)
PROGRESS NOTE  Shannon Meyer  TFT:[id number] DOB: May 01, 1966 DOA: 10/22/2018 PCP: No primary care provider on file.   Brief Narrative: Shannon Meyer is a 53 y.o. female with no significant medical history who presented with cough, weakness and shortness of breath found to have covid-19 with bilateral CXR opacities, intubated in ED and admitted to [facility] 5/20.   Assessment & Plan: Principal Problem:   COVID-19 Active Problems:   Leukocytosis   Sepsis ([place])   Elevated LFTs   QT prolongation   Acute respiratory failure (HCC)   Pressure injury of skin   Tracheostomy status (HCC)  Acute hypoxic respiratory failure due to covid-19 pneumonia/HCAP secondary to Pseudomonas: Failed extubation 6/8. - Completed remdesivir 5/21 - 5/30, received actemra 5/20, and 5/21. Received CTX 5/20-5/26, azithromycin 5/20-5/24. -She was noted to have foul-smelling sputum and mild fevers.  Respiratory culture positive for Pseudomonas.  Initially treated with Zosyn, but based on culture sensitivities was changed to ceftazidime. No further fever spikes   - Maintain euvolemia/net negative.  - Avoid NSAIDs  - Continue trach collar.  -Has some crackles at lung bases, will give 1 dose of Lasix today  ICU delirium:  -Patient was previously on Precedex which has since been weaned off.  She was started on twice daily clonazepam, twice daily Seroquel, scheduled oxycodone but remained quite somnolent during the day.  Her morning dose of clonazepam and Seroquel were discontinued and nightly doses will be changed to only as needed.  She appears to be less drowsy today.  Following more commands.  Continue current treatments  Sinus tachycardia: 130's - 160's.  Appears to have been persistent through her hospital stay.  Pulmonary embolus has been ruled out by CTA of the chest.  Since fevers have improved, heart rate has mildly improved. - Will give metoprolol and continue monitoring.  - TSH in normal range  Hypernatremia:  Resolved. - Continue free water  Hypokalemia:  - Supplement and monitor  Positive blood cultures: 1 of 4 bottles, suspect CoNS contaminant.   Obesity: BMI 34.  - Optimize nutritional status  DVT prophylaxis: Lovenox Code Status: Full Family Communication: discussed with daughter on 6/22 Disposition Plan: Remain in ICU. Therapy recommending CIR eventual disposition.   Consultants:   PCCM  Procedures:   5/20 ETT  5/24 R PICC line  6/11 Tracheostomy  Antimicrobials:  Azithromycin 5/20 >> 5/24  Rocephin 5/20 >> 5/26  Remdesivir 5/25 >> 5/30  Actemra 5/20 and 5/21  Diflucan 5/30 >> 6/1   Ceftazidime 6/19>  Subjective: She is awake and alert. Unable to speak due to trach. Answers questions with hand gestures appropriately  Objective: Vitals:   11/24/18 1133 11/24/18 1136 11/24/18 1200 11/24/18 1300  BP:   138/71 131/83  Pulse: (!) 107 (!) 49 (!) 46 (!) 48  Resp: (!) 42 (!) 46 (!) 47 (!) 53  Temp: 98.5 F (36.9 C)     TempSrc: Oral     SpO2: 95% 96% 96% 94%  Weight:      Height:        Intake/Output Summary (Last 24 hours) at 11/24/2018 1523 Last data filed at 11/24/2018 1400 Gross per 24 hour  Intake 1922.16 ml  Output 660 ml  Net 1262.16 ml   Filed Weights   11/19/18 0500 11/21/18 0448 11/24/18 0500  Weight: 88.6 kg 86.5 kg 86.1 kg   General exam: Alert, awake, no distress Respiratory system: Crackles at bases. Respiratory effort normal. Cardiovascular system: tachycardic. No murmurs, rubs, gallops. Gastrointestinal system: Abdomen  is nondistended, soft and nontender. No organomegaly or masses felt. Normal bowel sounds heard. Central nervous system:  No focal neurological deficits. Extremities: No C/C/E, +pedal pulses Skin: No rashes, lesions or ulcers Psychiatry: Judgement and insight appear normal. Mood & affect appropriate.     Data Reviewed: I have personally reviewed following labs and imaging studies  CBC: Recent Labs  Lab 11/18/18  0745 11/19/18 0200 11/20/18 0400 11/21/18 0445 11/22/18 0115 11/22/18 2116 11/23/18 0245 11/24/18 0240  WBC 14.7* 18.8* 17.0* 20.5* 17.2*  --  21.3* 23.9*  NEUTROABS 10.0* 14.3*  --   --   --   --   --   --   HGB 11.6* 11.7* 10.8* 11.4* 12.1 13.6 12.7 12.7  HCT 37.4 39.7 37.1 38.9 39.4 40.0 41.4 41.4  MCV 96.6 97.3 98.7 98.0 95.6  --  95.2 95.2  PLT 231 260 272 337 295  --  422* 435*   Basic Metabolic Panel: Recent Labs  Lab 11/20/18 0400 11/21/18 0445 11/22/18 0115 11/22/18 2116 11/23/18 0245 11/24/18 0240  NA 143 141 139 137 139 141  K 4.3 3.5 3.8 3.6 4.0 3.8  CL 103 99 100  --  101 102  CO2 34* 27 27  --  28 27  GLUCOSE 65* 103* 111*  --  103* 98  BUN 29* 19 13  --  12 26*  CREATININE 0.59 0.60 0.55  --  0.51 0.59  CALCIUM 9.0 8.8* 8.8*  --  9.1 9.4  MG  --   --   --   --   --  2.5*  PHOS  --   --   --   --   --  4.3   GFR: Estimated Creatinine Clearance: 84.4 mL/min (by C-G formula based on SCr of 0.59 mg/dL). Liver Function Tests: Recent Labs  Lab 11/20/18 0400 11/21/18 0445 11/22/18 0115 11/23/18 0245 11/24/18 0240  AST 77* 50* 48* 54* 46*  ALT 63* 59* 53* 54* 50*  ALKPHOS 172* 180* 182* 190* 171*  BILITOT 0.3 0.5 0.4 0.3 0.3  PROT 6.0* 6.3* 6.3* 7.1 7.1  ALBUMIN 2.7* 3.0* 3.0* 3.1* 3.2*   No results for input(s): LIPASE, AMYLASE in the last 168 hours. No results for input(s): AMMONIA in the last 168 hours. Coagulation Profile: No results for input(s): INR, PROTIME in the last 168 hours. Cardiac Enzymes: No results for input(s): CKTOTAL, CKMB, CKMBINDEX, TROPONINI in the last 168 hours. BNP (last 3 results) No results for input(s): PROBNP in the last 8760 hours. HbA1C: No results for input(s): HGBA1C in the last 72 hours. CBG: Recent Labs  Lab 11/23/18 1929 11/24/18 0012 11/24/18 0414 11/24/18 0723 11/24/18 1115  GLUCAP 134* 126* 119* 134* 93   Lipid Profile: No results for input(s): CHOL, HDL, LDLCALC, TRIG, CHOLHDL, LDLDIRECT in the  last 72 hours. Thyroid Function Tests: No results for input(s): TSH, T4TOTAL, FREET4, T3FREE, THYROIDAB in the last 72 hours. Anemia Panel: No results for input(s): VITAMINB12, FOLATE, FERRITIN, TIBC, IRON, RETICCTPCT in the last 72 hours. Urine analysis:    Component Value Date/Time   COLORURINE STRAW (A) 11/01/2018 0901   APPEARANCEUR HAZY (A) 11/01/2018 0901   LABSPEC 1.010 11/01/2018 0901   PHURINE 5.0 11/01/2018 0901   GLUCOSEU NEGATIVE 11/01/2018 0901   HGBUR NEGATIVE 11/01/2018 0901   BILIRUBINUR NEGATIVE 11/01/2018 0901   KETONESUR NEGATIVE 11/01/2018 0901   PROTEINUR NEGATIVE 11/01/2018 0901   NITRITE NEGATIVE 11/01/2018 0901   LEUKOCYTESUR NEGATIVE 11/01/2018 0901   Recent Results (  from the past 240 hour(s))  Culture, respiratory (non-expectorated)     Status: None   Collection Time: 11/19/18 10:59 AM   Specimen: Tracheal Aspirate; Respiratory  Result Value Ref Range Status   Specimen Description   Final    TRACHEAL ASPIRATE Performed at [facility], [address] [place], [place] [address]    Special Requests   Final    NONE Performed at [facility], [address] [place], [place] [address]    Gram Stain   Final    MODERATE WBC PRESENT,BOTH PMN AND MONONUCLEAR FEW GRAM NEGATIVE RODS FEW GRAM POSITIVE COCCI RARE GRAM VARIABLE ROD RARE YEAST Performed at [facility], [address] [place], [place] [address]    Culture FEW PSEUDOMONAS AERUGINOSA  Final   Report Status 11/21/2018 FINAL  Final   Organism ID, Bacteria PSEUDOMONAS AERUGINOSA  Final      Susceptibility   Pseudomonas aeruginosa - MIC*    CEFTAZIDIME 4 SENSITIVE Sensitive     CIPROFLOXACIN <=0.25 SENSITIVE Sensitive     GENTAMICIN <=1 SENSITIVE Sensitive     IMIPENEM <=0.25 SENSITIVE Sensitive     PIP/TAZO <=4 SENSITIVE Sensitive     CEFEPIME <=1 SENSITIVE Sensitive     * FEW PSEUDOMONAS AERUGINOSA      Radiology Studies: No results found.   Scheduled Meds: . chlorhexidine  15 mL Mouth/Throat BID  . Chlorhexidine Gluconate Cloth  6 each Topical Daily  . famotidine  20 mg Oral BID  . furosemide  40 mg Intravenous Once  . furosemide  40 mg Per Tube Daily  . heparin injection (subcutaneous)  7,500 Units Subcutaneous Q8H  . insulin aspart  0-9 Units Subcutaneous Q4H  . mouth rinse  15 mL Mouth Rinse 10 times per day  . metoprolol tartrate  50 mg Per Tube BID  . multivitamin  15 mL Per Tube Daily  . oxyCODONE  5 mg Per Tube Q6H  . sodium chloride flush  10-40 mL Intracatheter Q12H  . sodium chloride flush  3 mL Intravenous Q12H   Continuous Infusions: . sodium chloride Stopped (11/24/18 1342)  . cefTAZidime (FORTAZ)  IV 200 mL/hr at 11/24/18 1400  . feeding supplement (VITAL AF 1.2 CAL) 20 mL/hr at 11/24/18 0900     LOS: 33 days   Critical care time spent: 35 minutes.  Erick BlinksJehanzeb Memon, MD [facility] [email or web address] Password [email or web address] 11/24/2018, 3:23 PM

## 2018-11-24 NOTE — Progress Notes (Signed)
NAME:  Shannon BradfordDeborah Meyer, MRN:  [id number], DOB:  01/04/1966, LOS: 33 ADMISSION DATE:  10/22/2018, CONSULTATION DATE:  10/22/2018 REFERRING MD:  Pilar PlateBero, CHIEF COMPLAINT:  Dyspnea   Brief History   53 y/o female with no PMH [facility] 2018 due to ARDS due to COVID 19 pneumonia.  Past Medical History  None  Significant Hospital Events   5/20 Intubated in ED and [facility] 5/21-5/26 Weaning FIO2 and PEEP, diuresing 5/26-6/3 Tolerating weaning for 1-2 hours 6/7 weaning on pressure support ventilation again this morning 10/5 6/8 extubated, reintubated (tachypnea, tachycardia) 6/12 Trached 6/13 Trach collar all day  6/14 bleeding from tracheostomy site 6/17 48 hours off vent, fever > pseudomonas pneumonia 6/20 slightly more awake, remains off vent  Consults:  Pulmonary and critical care medicine  Procedures:  5/20 ET tube>6/11 Trach 6/11 >> 6/7 PICC line right arm>  Significant Diagnostic Tests:  Oct 28, 2018 CT angiogram chest no pulmonary embolism, bilateral atypical pneumonia consistent with COVID-19 CXR  Micro Data:  SARS-CoV-2 May 20+ Blood culture May 20 1 out of 2 GPC staph, coag negative resp 6/17 > pseudomonas aeruginosa  Antimicrobials:  Zithromax 5/20 >>5/24 Rocephin 5/20 >>5/26 Remdesivir 5/21 >5/30 Actemra 5/20>5/21 Solumedrol 5/12  > 5/26   Vanc 6/17 > x1 Zosyn 6/17 > 6/19 Ceftaz 6/17 >   Interim history/subjective:   Continues to remain very tachypnic. Patient follows commands and doesn't indicate pain.  Objective   Blood pressure 135/77, pulse (!) 121, temperature 99 F (37.2 C), temperature source Oral, resp. rate (!) 52, height 5\' 4"  (1.626 m), weight 86.1 kg, SpO2 (!) 89 %.    FiO2 (%):  [92 %] 92 %   Intake/Output Summary (Last 24 hours) at 11/24/2018 1001 Last data filed at 11/24/2018 0900 Gross per 24 hour  Intake 2102.28 ml  Output 660 ml  Net 1442.28 ml   Filed Weights   11/19/18 0500  11/21/18 0448 11/24/18 0500  Weight: 88.6 kg 86.5 kg 86.1 kg   Examination:  General:  Well appearing, NAD HENT: Tracheostomy site intact.  PULM: Bilateral fine crackles with broncho-vesicular breathing on left. Mild increase in accessory muscle use with improvement in sitting position.  CV: Normal HS, extremities warm. GI: Soft, NT, ND and +BS MSK: normal bulk and tone. Trace edema. Neuro: Awake and following commands  6/17 CXR > worsening infiltrates bilatearlly I reviewed CXR myself, trach is in a good position, infiltrate noted  Resolved Hospital Problem list     Assessment & Plan:   Resolving ARDS due to COVID-19 pneumonia Significant tachypnea limits progress and is likely related to still poor lung compliance. Normal ABG suggests tachypnea is in fact compensatory Overfeeding also possible. -Keep off ventilator (did not affect tachypnea in past) -Trial of permissive under feeding to see if improves respiratory rate. -Maintain even fluid balance. Additional iv furosemide today.  Delirium/ICU encephalopathy: improving Continue to wean sedatives Frequent re-orientation Progressive mobilization.  HCAP Pseudomonas pneumonia Complete 7 days of ceftazidime. Follow leukocytosis.  Severe physical deconditioning: Physical therapy/Occupational Therapy consult  Best practice:  Diet: Tube feeding as above. Pain/Anxiety/Delirium protocol (if indicated): RASS goal 0 VAP protocol (if indicated): Yes DVT prophylaxis: SCDs GI prophylaxis: Famotidine Glucose control: SSI Mobility: PT consult Code Status: Full Family Communication: will contact today Disposition: remain in ICU  Labs   CBC: Recent Labs  Lab 11/18/18 0745 11/19/18 0200 11/20/18 0400 11/21/18 0445 11/22/18 0115 11/22/18 2116 11/23/18 0245 11/24/18 0240  WBC 14.7* 18.8* 17.0* 20.5* 17.2*  --  21.3* 23.9*  NEUTROABS 10.0* 14.3*  --   --   --   --   --   --   HGB 11.6* 11.7* 10.8* 11.4* 12.1 13.6 12.7  12.7  HCT 37.4 39.7 37.1 38.9 39.4 40.0 41.4 41.4  MCV 96.6 97.3 98.7 98.0 95.6  --  95.2 95.2  PLT 231 260 272 337 295  --  422* 435*    Basic Metabolic Panel: Recent Labs  Lab 11/20/18 0400 11/21/18 0445 11/22/18 0115 11/22/18 2116 11/23/18 0245 11/24/18 0240  NA 143 141 139 137 139 141  K 4.3 3.5 3.8 3.6 4.0 3.8  CL 103 99 100  --  101 102  CO2 34* 27 27  --  28 27  GLUCOSE 65* 103* 111*  --  103* 98  BUN 29* 19 13  --  12 26*  CREATININE 0.59 0.60 0.55  --  0.51 0.59  CALCIUM 9.0 8.8* 8.8*  --  9.1 9.4  MG  --   --   --   --   --  2.5*  PHOS  --   --   --   --   --  4.3   GFR: Estimated Creatinine Clearance: 84.4 mL/min (by C-G formula based on SCr of 0.59 mg/dL). Recent Labs  Lab 11/21/18 0445 11/22/18 0115 11/23/18 0245 11/24/18 0240  WBC 20.5* 17.2* 21.3* 23.9*    Liver Function Tests: Recent Labs  Lab 11/20/18 0400 11/21/18 0445 11/22/18 0115 11/23/18 0245 11/24/18 0240  AST 77* 50* 48* 54* 46*  ALT 63* 59* 53* 54* 50*  ALKPHOS 172* 180* 182* 190* 171*  BILITOT 0.3 0.5 0.4 0.3 0.3  PROT 6.0* 6.3* 6.3* 7.1 7.1  ALBUMIN 2.7* 3.0* 3.0* 3.1* 3.2*   No results for input(s): LIPASE, AMYLASE in the last 168 hours. No results for input(s): AMMONIA in the last 168 hours.  ABG    Component Value Date/Time   PHART 7.424 11/22/2018 2116   PCO2ART 41.5 11/22/2018 2116   PO2ART 65.0 (L) 11/22/2018 2116   HCO3 27.1 11/22/2018 2116   TCO2 28 11/22/2018 2116   ACIDBASEDEF 1.0 10/24/2018 0419   O2SAT 92.0 11/22/2018 2116     Coagulation Profile: No results for input(s): INR, PROTIME in the last 168 hours.  Cardiac Enzymes: No results for input(s): CKTOTAL, CKMB, CKMBINDEX, TROPONINI in the last 168 hours.  HbA1C: No results found for: HGBA1C  CBG: Recent Labs  Lab 11/23/18 1200 11/23/18 1929 11/24/18 0012 11/24/18 0414 11/24/18 0723  GLUCAP 125* 134* 126* 119* [place], MD [facility] ICU Physician [place]  Pager:  [phone] Mobile: [phone] After hours: [phone].  11/24/2018, 10:19 AM

## 2018-11-24 NOTE — Progress Notes (Signed)
Spoke with [name] and provided full update on patient and answered all questions. Will plan to attempt FaceTime call around 5pm.

## 2018-11-24 NOTE — Progress Notes (Signed)
Attempted to call patient's daughter, [name], however no answer at either phone number listed in [place]. Will attempt to call back at a later time to provide update.

## 2018-11-24 NOTE — Progress Notes (Signed)
  Speech Language Pathology Treatment: Shannon Meyer Speaking valve  Patient Details Name: Shannon Meyer MRN: [id number] DOB: [birth year] Today's Date: 11/24/2018 Time: 1962-2297 SLP Time Calculation (min) (ACUTE ONLY): 25 min  Assessment / Plan / Recommendation Clinical Impression  Pt partially reclined in chair, adequately awake with nursing present for Passy-Muir speaking valve trials with ST. She was cooperative and receptive to therapy and RT changing O2 set up from HME/O2 to HFNC with PMV. Her cuff was deflated at baseline. Pt tolerated for approximately 15 minutes: SpO2 91-95%, HR 102 and RR 32-47 (mostly in mid-upper 30's). She spoke in sentences with approximately 70% accuracy; low intensity and intermittent breaks in phonation. Respiratory effort was mild-moderate to support sentence level verbalizations. Today's session was encouraging given her level of alertness and cooperation and perhaps can Face time with her daughter during future sessions. If pt attempting to converse later in shift, RN Thurmond Meyer) can switch to HFNC and use valve with full supervision if warranted. If she continues to progress, she will be appropriate for bedside swallow assessment.    HPI HPI: Pt adm 5/20 with hypoxic resp failure due to Covid 19. Pt intubated 5/20 and extubated 6/8, with re-intubation 6/8; Trach 6/11. PMH - none.       SLP Plan  Continue with current plan of care       Recommendations         Patient may use Passy-Muir Speech Valve: Intermittently with supervision(RN and RT) PMSV Supervision: Full         Oral Care Recommendations: Oral care QID Follow up Recommendations: Inpatient Rehab SLP Visit Diagnosis: Aphonia (R49.1) Plan: Continue with current plan of care       GO                Shannon Meyer 11/24/2018, 5:22 PM  Shannon Meyer Shannon Meyer.Ed Risk analyst [phone] Office [phone]

## 2018-11-24 NOTE — Progress Notes (Signed)
Patient noted with 15 beat nonsustained monomorphic V-Tach. Patient asymptomatic during episode. Patient was being turned in the bed to her right side for pericare and bed pad change. Patient converted back to baseline sinus tach without intervention. MD to be updated during AM rounds.

## 2018-11-24 NOTE — Progress Notes (Signed)
Pharmacy Antibiotic Note  Shannon Meyer is a 53 y.o. female admitted on 10/22/2018 with COVID-19 pneumonia. She had foul smelling trach site secretions, elevated WBC and temp, and later developed tube feed vomiting.  Pharmacy dosing ceftazidime for pseudomonas pneumonia. On D#5 of antibiotics. Of note, leukocytosis is worsening (no steroids) but patient remains afebrile.   Plan:  Ceftazidime 2g IV q8h  Follow up renal function, and clinical course.   Height: 5\' 4"  (162.6 cm) Weight: 189 lb 13.1 oz (86.1 kg) IBW/kg (Calculated) : 54.7  Temp (24hrs), Avg:98.8 F (37.1 C), Min:98.5 F (36.9 C), Max:99.2 F (37.3 C)  Recent Labs  Lab 11/20/18 0400 11/21/18 0445 11/22/18 0115 11/23/18 0245 11/24/18 0240  WBC 17.0* 20.5* 17.2* 21.3* 23.9*  CREATININE 0.59 0.60 0.55 0.51 0.59    Estimated Creatinine Clearance: 84.4 mL/min (by C-G formula based on SCr of 0.59 mg/dL).    No Known Allergies  Antimicrobials this admission: 5/20 Azith >> 5/24 5/20 CTX >> 5/26 5/20 Vit C/ Zinc >>6/1 5/20 Actemra 5/21 Actemra 5/21 Remdesivir >> 5/30 5/30 Fluconazole >> 6/1 6/17 Zosyn >> 6/19 6/17 Vanc >> 6/18 6/19 Ceftazidime >>   Microbiology results: 5/20 SARS Coronavirus 2:  positive 5/20 BCx: 1/2 bottles CNS      5/22 BCID:  coag neg staph species. 5/23 MRSA PCR: neg 6/17 TA:  few Pseudomonas aeruginosa (pan-sens to all tested)  Thank you for allowing pharmacy to be a part of this patient's care.  Albertina Parr, PharmD., BCPS Clinical Pharmacist Clinical phone for 11/24/18 until 5pm: [phone]

## 2018-11-24 NOTE — Progress Notes (Signed)
Occupational Therapy Treatment Patient Details Name: Shannon BradfordDeborah Meyer MRN: [id number] DOB: 01/03/1966 Today's Date: 11/24/2018    History of present illness Pt adm 5/20 with hypoxic resp failure due to Covid 19. Pt intubated 5/20 and extubated 6/8, with re-intubation 6/8; Trach 6/11. PMH - none. 6/17 foul smelling trach site/secretions, elevated WBC and temp; vomited tube feeds.   OT comments  Pt smiling on entry and following 1 step commands. Sustained attention to tasks when not fatigued.Agreeable to getting to chair. Seen as co-treat with PT today and able to mobilize to EOB with Mod A. Min A at times to maintain midline postural control @ 8 min. Pt initiating movement to EOB. Most likely became orthostatic and used the maximove to transfer OOB to chair. Pt tachypnic with RR 40s-low 50s. HR 102; BP 144/101 end of session on 10L.- 15 L during mobility. Nsg decreased to 6L at end of session.  Pt making steady progress and will need aggressive rehab to progress to the next level.  Encourage bed in chair position 3x/day; Blinds open during the day if possible; frequent reorientation to reduce delirium. Please encourage use of squeeze ball and using dry erase board/adapted pen for communication.  Will continue to follow acutely.    Follow Up Recommendations  CIR;Supervision/Assistance - 24 hour    Equipment Recommendations  3 in 1 bedside commode;Other (comment)    Recommendations for Other Services Rehab consult    Precautions / Restrictions Precautions Precautions: Fall Precaution Comments: t-bar, multiple lines; coretrack       Mobility Bed Mobility Overal bed mobility: Needs Assistance Bed Mobility: Rolling;Sidelying to Sit;Sit to Sidelying Rolling: Min assist Sidelying to sit: Mod assist;+2 for physical assistance     Sit to sidelying: Max assist;+2 for physical assistance General bed mobility comments: Pt initiating moving legs toward EOB; Pt appropriately reaching toward rail.  Attempts to grasp rail. Mod A to help push upright into sitting  Transfers Overall transfer level: Needs assistance               General transfer comment: Initially planned to use Stedy to progress OOB, however, pt appeared to be too weak to use Stedy. Returned to bed and used maximove to progress OOB to chair    Balance     Sitting balance-[name] Scale: Poor Sitting balance - Comments: Sat EOB @ 8 min. Returned due to fatigue andmost likely hypotensive.                                    ADL either performed or assessed with clinical judgement   ADL Overall ADL's : Needs assistance/impaired Eating/Feeding: NPO   Grooming: Moderate assistance;Sitting;Wash/dry face;Wash/dry hands   Upper Body Bathing: Maximal assistance;Sitting;Bed level                           Functional mobility during ADLs: +2 for physical assistance;Maximal assistance General ADL Comments: Difficulty sustaining attention to task to complete ADL; most likely affecte by delirium, meds and weakness     Vision   Additional Comments: Difficulty sustaining visual attention; most likely affected by meds   Perception     Praxis      Cognition Arousal/Alertness: Awake/alert Behavior During Therapy: Restless;Flat affect Overall Cognitive Status: Impaired/Different from baseline Area of Impairment: Orientation;Attention;Memory;Following commands;Safety/judgement;Problem solving;Awareness  Orientation Level: Disoriented to;Place;Time Current Attention Level: Sustained;Focused Memory: Decreased short-term memory Following Commands: Follows one step commands consistently Safety/Judgement: Decreased awareness of deficits;Decreased awareness of safety Awareness: Intellectual Problem Solving: Slow processing;Decreased initiation;Difficulty sequencing;Requires verbal cues;Requires tactile cues General Comments: Pt upright in bed smiling upon entry to room.  Nodding head yes in response to "do you want ot get out of bed". Following commands; impulsive; improved attention and able to sustain to tasks with min redirectional cues        Exercises Exercises: General Upper Extremity General Exercises - Upper Extremity Shoulder Flexion: AAROM;Both;10 reps;Seated Shoulder ABduction: AAROM;Both;10 reps;Seated Elbow Flexion: AAROM;Both;10 reps;Seated;Supine Elbow Extension: AROM;Both;10 reps;Seated;Supine Digit Composite Flexion: Squeeze ball;Both;10 reps   Shoulder Instructions       General Comments      Pertinent Vitals/ Pain       Pain Assessment: Faces Faces Pain Scale: Hurts a little bit Pain Location: generalized Pain Descriptors / Indicators: Discomfort;Grimacing Pain Intervention(s): Limited activity within patient's tolerance  Home Living                                          Prior Functioning/Environment              Frequency  Min 3X/week        Progress Toward Goals  OT Goals(current goals can now be found in the care plan section)  Progress towards OT goals: Progressing toward goals  Acute Rehab OT Goals Patient Stated Goal: none stated OT Goal Formulation: Patient unable to participate in goal setting Time For Goal Achievement: 12/01/18 Potential to Achieve Goals: Good ADL Goals Pt Will Perform Grooming: with min assist;sitting Pt Will Transfer to Toilet: stand pivot transfer;bedside commode;with mod assist Pt Will Perform Toileting - Clothing Manipulation and hygiene: with mod assist;sit to/from stand;sitting/lateral leans Additional ADL Goal #1: Pt will perform bed mobility with Min A in preparation for ADLs Additional ADL Goal #2: Pt will tolerate sitting at EOB for 10 minutes with Min Guard A in preparation for ADLs Additional ADL Goal #3: Pt will demonstrate selective attention during ADL in a distracting environement with Min cues  Plan Discharge plan remains appropriate     Co-evaluation    PT/OT/SLP Co-Evaluation/Treatment: Yes Reason for Co-Treatment: Complexity of the patient's impairments (multi-system involvement);For patient/therapist safety;To address functional/ADL transfers   OT goals addressed during session: ADL's and self-care;Strengthening/ROM      AM-PAC OT "6 Clicks" Daily Activity     Outcome Measure   Help from another person eating meals?: Total Help from another person taking care of personal grooming?: A Lot Help from another person toileting, which includes using toliet, bedpan, or urinal?: A Lot Help from another person bathing (including washing, rinsing, drying)?: A Lot Help from another person to put on and taking off regular upper body clothing?: Total Help from another person to put on and taking off regular lower body clothing?: Total 6 Click Score: 9    End of Session Equipment Utilized During Treatment: Oxygen(10-15L)  OT Visit Diagnosis: Unsteadiness on feet (R26.81);Other abnormalities of gait and mobility (R26.89);Muscle weakness (generalized) (M62.81);Other symptoms and signs involving cognitive function Pain - part of body: (generalized)   Activity Tolerance Patient tolerated treatment well   Patient Left in chair;with call bell/phone within reach;with nursing/sitter in room   Nurse Communication Mobility status;Need for lift equipment        Time:  [id number] OT Time Calculation (min): 40 min  Charges: OT General Charges $OT Visit: 1 Visit OT Treatments $Self Care/Home Management : 8-22 mins $Therapeutic Activity: 8-22 mins  Luisa DagoHilary Reanne Nellums, OT/L   Acute OT Clinical Specialist Acute Rehabilitation Services Pager [phone] Office [phone]    [facility] HospitalWARD,HILLARY 11/24/2018, 4:26 PM

## 2018-11-24 NOTE — Progress Notes (Signed)
Physical Therapy Treatment Patient Details Name: Shannon Meyer MRN: [id number] DOB: Mar 18, 1966 Today's Date: 11/24/2018    History of Present Illness Pt adm 5/20 with hypoxic resp failure due to Covid 19. Pt intubated 5/20 and extubated 6/8, with re-intubation 6/8; Trach 6/11. PMH - none. 6/17 foul smelling trach site/secretions, elevated WBC and temp; vomited tube feeds.    PT Comments    The patient tolerated sitting on bed edge with 2 assist. Required mechanical lft  For transfer to recliner. Noted RR 30-50, HR 120-150,            RN increased O2 to 15 L on FiO2 30% for mobility with ?Sao@ in 90's . Patient more alert and able to follow some directions during activity, continue PT.  Follow Up Recommendations  CIR     Equipment Recommendations  Other (comment)    Recommendations for Other Services       Precautions / Restrictions Precautions Precautions: Fall Precaution Comments: t-bar, multiple lines; coretrack    Mobility  Bed Mobility Overal bed mobility: Needs Assistance Bed Mobility: Rolling;Sidelying to Sit;Sit to Sidelying Rolling: Min assist Sidelying to sit: Mod assist;+2 for physical assistance     Sit to sidelying: Max assist;+2 for physical assistance General bed mobility comments: Pt initiating moving legs toward EOB; Pt appropriately reaching toward rail. Attempts to grasp rail. Mod A to help push upright into sitting  Transfers Overall transfer level: Needs assistance               General transfer comment: Initially planned to use Stedy to progress OOB, however, pt appeared to be too weak to use Stedy. Returned to bed and used maximove to progress OOB to chair  Ambulation/Gait                 Stairs             Wheelchair Mobility    Modified Rankin (Stroke Patients Only)       Balance     Sitting balance-[name] Scale: Poor Sitting balance - Comments: Sat EOB @ 8 min. Returned due to fatigue andmost likely hypotensive.                                      Cognition Arousal/Alertness: Awake/alert Behavior During Therapy: Restless;Flat affect Overall Cognitive Status: Impaired/Different from baseline Area of Impairment: Orientation;Attention;Memory;Following commands;Safety/judgement;Problem solving;Awareness                 Orientation Level: Disoriented to;Place;Time Current Attention Level: Sustained;Focused Memory: Decreased short-term memory Following Commands: Follows one step commands consistently Safety/Judgement: Decreased awareness of deficits;Decreased awareness of safety Awareness: Intellectual Problem Solving: Slow processing;Decreased initiation;Difficulty sequencing;Requires verbal cues;Requires tactile cues General Comments: Pt upright in bed smiling upon entry to room. Nodding head yes in response to "do you want ot get out of bed". Following commands; impulsive; improved attention and able to sustain to tasks with min redirectional cues      Exercises General Exercises - Upper Extremity Shoulder Flexion: AAROM;Both;10 reps;Seated Shoulder ABduction: AAROM;Both;10 reps;Seated Elbow Flexion: AAROM;Both;10 reps;Seated;Supine Elbow Extension: AROM;Both;10 reps;Seated;Supine Digit Composite Flexion: Squeeze ball;Both;10 reps    General Comments        Pertinent Vitals/Pain Pain Assessment: Faces Faces Pain Scale: Hurts a little bit Pain Location: generalized Pain Descriptors / Indicators: Discomfort;Grimacing Pain Intervention(s): Repositioned;Monitored during session    Home Living  Prior Function            PT Goals (current goals can now be found in the care plan section) Acute Rehab PT Goals Patient Stated Goal: none stated Progress towards PT goals: Progressing toward goals    Frequency    Min 3X/week      PT Plan Current plan remains appropriate    Co-evaluation PT/OT/SLP Co-Evaluation/Treatment: Yes Reason  for Co-Treatment: Complexity of the patient's impairments (multi-system involvement);For patient/therapist safety;To address functional/ADL transfers PT goals addressed during session: Mobility/safety with mobility OT goals addressed during session: ADL's and self-care      AM-PAC PT "6 Clicks" Mobility   Outcome Measure  Help needed turning from your back to your side while in a flat bed without using bedrails?: A Lot Help needed moving from lying on your back to sitting on the side of a flat bed without using bedrails?: Total Help needed moving to and from a bed to a chair (including a wheelchair)?: Total Help needed standing up from a chair using your arms (e.g., wheelchair or bedside chair)?: Total Help needed to walk in hospital room?: Total Help needed climbing 3-5 steps with a railing? : Total 6 Click Score: 7    End of Session Equipment Utilized During Treatment: Oxygen Activity Tolerance: Patient tolerated treatment well Patient left: in chair;with call bell/phone within reach Nurse Communication: Need for lift equipment;Mobility status PT Visit Diagnosis: Other abnormalities of gait and mobility (R26.89);Muscle weakness (generalized) (M62.81)     Time: 1455-1530 PT Time Calculation (min) (ACUTE ONLY): 35 min  Charges:  $Therapeutic Activity: 8-22 mins                     Blanchard KelchKaren Dominque Levandowski PT Acute Rehabilitation Services Pager [phone] Office [phone]    Rada HayHill, Journiee Feldkamp Elizabeth 11/24/2018, 6:16 PM

## 2018-11-25 ENCOUNTER — Inpatient Hospital Stay (HOSPITAL_COMMUNITY): Payer: HRSA Program

## 2018-11-25 LAB — BASIC METABOLIC PANEL
Anion gap: 12 (ref 5–15)
BUN: 27 mg/dL — ABNORMAL HIGH (ref 6–20)
CO2: 27 mmol/L (ref 22–32)
Calcium: 9.2 mg/dL (ref 8.9–10.3)
Chloride: 104 mmol/L (ref 98–111)
Creatinine, Ser: 0.59 mg/dL (ref 0.44–1.00)
GFR calc Af Amer: >60 mL/min (ref >60)
GFR calc non Af Amer: >60 mL/min (ref >60)
Glucose, Bld: 115 mg/dL — ABNORMAL HIGH (ref 70–99)
Potassium: 3.6 mmol/L (ref 3.5–5.1)
Sodium: 143 mmol/L (ref 135–145)

## 2018-11-25 LAB — CBC WITH DIFFERENTIAL/PLATELET
Abs Immature Granulocytes: 0.34 10*3/uL — ABNORMAL HIGH (ref 0.00–0.07)
Basophils Absolute: 0.1 10*3/uL (ref 0.0–0.1)
Basophils Relative: 0 %
Eosinophils Absolute: 2.1 10*3/uL — ABNORMAL HIGH (ref 0.0–0.5)
Eosinophils Relative: 8 %
HCT: 40.9 % (ref 36.0–46.0)
Hemoglobin: 12.7 g/dL (ref 12.0–15.0)
Immature Granulocytes: 1 %
Lymphocytes Relative: 19 %
Lymphs Abs: 4.8 10*3/uL — ABNORMAL HIGH (ref 0.7–4.0)
MCH: 29.5 pg (ref 26.0–34.0)
MCHC: 31.1 g/dL (ref 30.0–36.0)
MCV: 95.1 fL (ref 80.0–100.0)
Monocytes Absolute: 2.2 10*3/uL — ABNORMAL HIGH (ref 0.1–1.0)
Monocytes Relative: 9 %
Neutro Abs: 15.4 10*3/uL — ABNORMAL HIGH (ref 1.7–7.7)
Neutrophils Relative %: 63 %
Platelets: UNDETERMINED 10*3/uL (ref 150–400)
RBC: 4.3 MIL/uL (ref 3.87–5.11)
RDW: 18.9 % — ABNORMAL HIGH (ref 11.5–15.5)
WBC: 25 10*3/uL — ABNORMAL HIGH (ref 4.0–10.5)
nRBC: 0.1 % (ref 0.0–0.2)

## 2018-11-25 LAB — GLUCOSE, CAPILLARY
Glucose-Capillary: 100 mg/dL — ABNORMAL HIGH (ref 70–99)
Glucose-Capillary: 109 mg/dL — ABNORMAL HIGH (ref 70–99)
Glucose-Capillary: 110 mg/dL — ABNORMAL HIGH (ref 70–99)
Glucose-Capillary: 112 mg/dL — ABNORMAL HIGH (ref 70–99)
Glucose-Capillary: 117 mg/dL — ABNORMAL HIGH (ref 70–99)
Glucose-Capillary: 132 mg/dL — ABNORMAL HIGH (ref 70–99)
Glucose-Capillary: 85 mg/dL (ref 70–99)

## 2018-11-25 MED ORDER — OXYCODONE HCL 5 MG PO TABS
5.0000 mg | ORAL_TABLET | Freq: Two times a day (BID) | ORAL | Status: DC
  Administered 2018-11-28 – 2018-11-29 (×4): 5 mg
  Filled 2018-11-25 (×4): qty 1

## 2018-11-25 MED ORDER — OXYCODONE HCL 5 MG PO TABS: 5.0000 mg | ORAL_TABLET | Freq: Every day | ORAL | Status: DC

## 2018-11-25 MED ORDER — OXYCODONE HCL 5 MG PO TABS
5.0000 mg | ORAL_TABLET | Freq: Three times a day (TID) | ORAL | Status: AC
  Administered 2018-11-25 – 2018-11-28 (×9): 5 mg
  Filled 2018-11-25 (×8): qty 1

## 2018-11-25 NOTE — Progress Notes (Signed)
Updates given to patient's sister.

## 2018-11-25 NOTE — Progress Notes (Addendum)
  Speech Language Pathology Treatment: [name] Speaking valve  Patient Details Name: Shannon Meyer MRN: [id number] DOB: [birth year] Today's Date: 11/25/2018 Time: 5885-0277 SLP Time Calculation (min) (ACUTE ONLY): 41 min  Assessment / Plan / Recommendation Clinical Impression  Pt's cuff was deflated upon arrival and RN assisted to transition pt from 3L via trach (with HME) to 4L via [place] in order to place PMV. Of note, pt showed a left-sided gaze preference throughout session, although without other clearly identified focal neurological changes. Pt's SpO2 and RR fluctuated throughout placement despite no evidence of air trapping after 18 minutes of use (79% SpO2 at its lowest, high 40s RR at its highest). Supplemental O2 was incrementally increased during session up to 8L via HFNC, although pt was still unable to bring her saturations up beyond the low 80s. PMV was removed with gradual increase back to baseline of 90-92%. She appears to have adequate upper airway patency given no back pressure and ability to produce phonation, albeit hoarse and low in volume. Suspect that this may be a reflection of her overall respiratory stability and endurance. Would continue to use PMV only for brief intervals and with full supervision from staff. Pt was left with stable VS and on 6L via HFNC.    HPI HPI: Pt adm 5/20 with hypoxic resp failure due to Covid 19. Pt intubated 5/20 and extubated 6/8, with re-intubation 6/8; Trach 6/11. PMH - none.       SLP Plan  Continue with current plan of care       Recommendations         Patient may use Passy-Muir Speech Valve: Intermittently with supervision PMSV Supervision: Full         Oral Care Recommendations: Oral care QID Follow up Recommendations: Inpatient Rehab SLP Visit Diagnosis: Aphonia (R49.1) Plan: Continue with current plan of care       GO                [name] Jamaurion Slemmer 11/25/2018, 3:07 PM  Pollyann Glen, M.A. [place] Acute Art therapist [phone] Office [phone]

## 2018-11-25 NOTE — Progress Notes (Addendum)
Occupational Therapy Treatment Patient Details Name: Shannon BradfordDeborah Meyer MRN: [id number] DOB: 09/28/1965 Today's Date: 11/25/2018    History of present illness Pt adm 5/20 with hypoxic resp failure due to Covid 19. Pt intubated 5/20 and extubated 6/8, with re-intubation 6/8; Trach 6/11. PMH - none. 6/17 foul smelling trach site/secretions, elevated WBC and temp; vomited tube feeds.   OT comments  Upon arrival, pt sitting in recliner resting; SpO2 90 on 3L with HME, HR 110s, and RR 30s. Co-x with SLP. Switching to PMV on 4L via [place]. Pt also with left side gaze preference and requiring increased cues to attend to right side. Pt participating in trunk exercises to pull forward; 2 reps. Noting SpO2 decrease and RR elevated; elevated to 6L and then 8L via HFNC. Pt performing oral care while seated in recliner with Mod A for facilitating AAROM to brush teeth. Pt participating in 5 reps AAROM hands with sqeeze ball. Pt requiring significant time to return SpO2 to 90% on 8L and then able to wean to 6L; notified RN and RT. Continue to recommend dc to CIR and will continue to follow acutely as admitted.    Follow Up Recommendations  CIR;Supervision/Assistance - 24 hour    Equipment Recommendations  3 in 1 bedside commode;Other (comment)    Recommendations for Other Services Rehab consult    Precautions / Restrictions Precautions Precautions: Fall Precaution Comments: t-bar, multiple lines; coretrack Restrictions Weight Bearing Restrictions: No       Mobility Bed Mobility               General bed mobility comments: In recliner upon arrival  Transfers                 General transfer comment: Defered due to fatigue and stats    Balance Overall balance assessment: Needs assistance Sitting-balance support: Feet unsupported;Bilateral upper extremity supported Sitting balance-[name] Scale: Poor   Postural control: Posterior lean                                 ADL  either performed or assessed with clinical judgement   ADL Overall ADL's : Needs assistance/impaired     Grooming: Moderate assistance;Sitting;Oral care Grooming Details (indicate cue type and reason): While sitting in recliner, pt requiring Mod A for facilitating back and forth motion for brushing teeth. Pt able to hold grasp onto yonker.                               General ADL Comments: Focused session on endurance, strengthing, and oral care while seated in recliner     Vision   Additional Comments: Pt also with left side gaze preference and requiring increased cues to attend to right side   Perception     Praxis      Cognition Arousal/Alertness: Awake/alert Behavior During Therapy: Restless;Flat affect Overall Cognitive Status: Impaired/Different from baseline Area of Impairment: Orientation;Attention;Memory;Following commands;Safety/judgement;Problem solving;Awareness                 Orientation Level: Disoriented to;Place;Time Current Attention Level: Sustained;Focused Memory: Decreased short-term memory Following Commands: Follows one step commands inconsistently;Follows one step commands with increased time Safety/Judgement: Decreased awareness of deficits;Decreased awareness of safety Awareness: Intellectual Problem Solving: Slow processing;Decreased initiation;Difficulty sequencing;Requires verbal cues;Requires tactile cues General Comments: Requiring increased time and cues throughout. Pt with decreased attention and would fatigue. following simple  commands with increased time. Pt also with left side gaze preference and requiring increased cues to attend to right side        Exercises Other Exercises Other Exercises: Pt reaching forward with BUEs for OT's hands. Pt then pulling trunk into upright position and holding for 5 seconds. x2reps.  Other Exercises: 5 reps of composite flexion with squeeze ball   Shoulder Instructions       General  Comments SpO2 dropping down to 79% on 4L via [place]. Elevated to 6 and then 8L on HFNC with PMV in place (SLP present). Placed back on adapted trach collor (with filter). Pt elevating to 90 on 8L. While weaning down, pt dropping to 80s. Able to wean to 6L.     Pertinent Vitals/ Pain       Pain Assessment: Faces Faces Pain Scale: No hurt Pain Intervention(s): Monitored during session  Home Living                                          Prior Functioning/Environment              Frequency  Min 3X/week        Progress Toward Goals  OT Goals(current goals can now be found in the care plan section)  Progress towards OT goals: Progressing toward goals  Acute Rehab OT Goals Patient Stated Goal: none stated OT Goal Formulation: Patient unable to participate in goal setting Time For Goal Achievement: 12/01/18 Potential to Achieve Goals: Good ADL Goals Pt Will Perform Grooming: with min assist;sitting Pt Will Transfer to Toilet: stand pivot transfer;bedside commode;with mod assist Pt Will Perform Toileting - Clothing Manipulation and hygiene: with mod assist;sit to/from stand;sitting/lateral leans Additional ADL Goal #1: Pt will perform bed mobility with Min A in preparation for ADLs Additional ADL Goal #2: Pt will tolerate sitting at EOB for 10 minutes with Min Guard A in preparation for ADLs Additional ADL Goal #3: Pt will demonstrate selective attention during ADL in a distracting environement with Min cues  Plan Discharge plan remains appropriate    Co-evaluation    PT/OT/SLP Co-Evaluation/Treatment: Yes Reason for Co-Treatment: Complexity of the patient's impairments (multi-system involvement);Necessary to address cognition/behavior during functional activity(With SLP)   OT goals addressed during session: ADL's and self-care SLP goals addressed during session: Swallowing;Communication    AM-PAC OT "6 Clicks" Daily Activity     Outcome Measure   Help  from another person eating meals?: Total Help from another person taking care of personal grooming?: A Lot Help from another person toileting, which includes using toliet, bedpan, or urinal?: A Lot Help from another person bathing (including washing, rinsing, drying)?: A Lot Help from another person to put on and taking off regular upper body clothing?: Total Help from another person to put on and taking off regular lower body clothing?: Total 6 Click Score: 9    End of Session Equipment Utilized During Treatment: Oxygen(3-8L)  OT Visit Diagnosis: Unsteadiness on feet (R26.81);Other abnormalities of gait and mobility (R26.89);Muscle weakness (generalized) (M62.81);Other symptoms and signs involving cognitive function Pain - part of body: (generalized)   Activity Tolerance Patient tolerated treatment well   Patient Left in chair;with call bell/phone within reach;with nursing/sitter in room   Nurse Communication Mobility status;Need for lift equipment        Time: 1354-1440 OT Time Calculation (min): 46 min  Charges: OT General Charges $  OT Visit: 1 Visit OT Treatments $Self Care/Home Management : 8-22 mins $Therapeutic Activity: 8-22 mins  [place], OTR/L Acute Rehab Pager: [phone] Office: [place] 11/25/2018, 5:31 PM

## 2018-11-25 NOTE — Progress Notes (Signed)
Assisted patient with Facetime video with daughter, [name].

## 2018-11-25 NOTE — Progress Notes (Signed)
PROGRESS NOTE  Shannon Meyer  ZOX:[id number] DOB: 07/29/1965 DOA: 10/22/2018 PCP: No primary care provider on file.   Brief Narrative: Shannon Meyer is a 53 y.o. female with no significant medical history who presented with cough, weakness and shortness of breath found to have covid-19 with bilateral CXR opacities, intubated in ED and [facility] She has had a prolonged hospital course. She was unable to wean from ventilator and underwent tracheostomy. She began developing fevers and found to have pseudomonas in her sputum. She is currently on antibiotics. Still has significant leukocytosis that is being monitored. Weaning off oxygen. Plan is for CIR once stable.  Assessment & Plan: Principal Problem:   COVID-19 Active Problems:   Leukocytosis   Sepsis (HCC)   Elevated LFTs   QT prolongation   Acute respiratory failure (HCC)   Pressure injury of skin   Tracheostomy status (HCC)  Acute hypoxic respiratory failure due to covid-19 pneumonia/HCAP secondary to Pseudomonas: Failed extubation 6/8. - Completed remdesivir 5/21 - 5/30, received actemra 5/20, and 5/21. Received CTX 5/20-5/26, azithromycin 5/20-5/24. -She was noted to have foul-smelling sputum and mild fevers.  Respiratory culture positive for Pseudomonas.  Initially treated with Zosyn, but based on culture sensitivities was changed to ceftazidime. No further fever spikes   - Maintain euvolemia/net negative.  - Avoid NSAIDs  - Continue trach collar.    ICU delirium:  -Patient was previously on Precedex which has since been weaned off.  Oral sedatives/pain meds were started and are now being tapered. Mental status improving.  Sinus tachycardia.  Pulmonary embolus has been ruled out by CTA of the chest.  TSH in normal range. No signs of sepsis. - Will give metoprolol and continue monitoring.    Leukocytosis -initially felt to be related to pneumonia -currently, she is not febrile and respiratory status has improved  -no diarrhea -continue to monitor   Hypernatremia: Resolved. - Continue free water  Hypokalemia:  - Supplement and monitor  Positive blood cultures: 1 of 4 bottles, suspect CoNS contaminant.   Obesity: BMI 34.  - Optimize nutritional status  DVT prophylaxis: Lovenox Code Status: Full Family Communication: discussed with daughter on 6/22 Disposition Plan: Remain in ICU. Therapy recommending CIR eventual disposition.   Consultants:   PCCM  Procedures:   5/20 ETT  5/24 R PICC line  6/11 Tracheostomy  Antimicrobials:  Azithromycin 5/20 >> 5/24  Rocephin 5/20 >> 5/26  Remdesivir 5/25 >> 5/30  Actemra 5/20 and 5/21  Diflucan 5/30 >> 6/1   Ceftazidime 6/19>  Subjective: Patient is somnolent, she opens eyes to voice. Denies any pain or shortness of breath at this time  Objective: Vitals:   11/25/18 1500 11/25/18 1542 11/25/18 1600 11/25/18 1700  BP: (!) 144/130  128/76 124/60  Pulse: 88 98 (!) 105 (!) 101  Resp: (!) 31 (!) 39 (!) 36 (!) 38  Temp:      TempSrc:      SpO2: 92% 95% 92% 92%  Weight:      Height:        Intake/Output Summary (Last 24 hours) at 11/25/2018 1748 Last data filed at 11/25/2018 1700 Gross per 24 hour  Intake 1020.6 ml  Output 450 ml  Net 570.6 ml   Filed Weights   11/21/18 0448 11/24/18 0500 11/25/18 0600  Weight: 86.5 kg 86.1 kg 84.5 kg   General exam: somnolent, but wakes up to voice Respiratory system: Crackles at bases. Respiratory effort normal. Cardiovascular system: tachycardic. No murmurs, rubs, gallops. Gastrointestinal  system: Abdomen is nondistended, soft and nontender. No organomegaly or masses felt. Normal bowel sounds heard. Central nervous system: No focal neurological deficits. Extremities: No C/C/E, +pedal pulses Skin: No rashes, lesions or ulcers Psychiatry: unable to assess   Data Reviewed: I have personally reviewed following labs and imaging studies  CBC: Recent Labs  Lab 11/19/18 0200  11/21/18  0445 11/22/18 0115 11/22/18 2116 11/23/18 0245 11/24/18 0240 11/25/18 0155  WBC 18.8*   < > 20.5* 17.2*  --  21.3* 23.9* 25.0*  NEUTROABS 14.3*  --   --   --   --   --   --  15.4*  HGB 11.7*   < > 11.4* 12.1 13.6 12.7 12.7 12.7  HCT 39.7   < > 38.9 39.4 40.0 41.4 41.4 40.9  MCV 97.3   < > 98.0 95.6  --  95.2 95.2 95.1  PLT 260   < > 337 295  --  422* 435* PLATELET CLUMPS NOTED ON SMEAR, UNABLE TO ESTIMATE   < > = values in this interval not displayed.   Basic Metabolic Panel: Recent Labs  Lab 11/21/18 0445 11/22/18 0115 11/22/18 2116 11/23/18 0245 11/24/18 0240 11/25/18 0155  NA 141 139 137 139 141 143  K 3.5 3.8 3.6 4.0 3.8 3.6  CL 99 100  --  101 102 104  CO2 27 27  --  28 27 27   GLUCOSE 103* 111*  --  103* 98 115*  BUN 19 13  --  12 26* 27*  CREATININE 0.60 0.55  --  0.51 0.59 0.59  CALCIUM 8.8* 8.8*  --  9.1 9.4 9.2  MG  --   --   --   --  2.5*  --   PHOS  --   --   --   --  4.3  --    GFR: Estimated Creatinine Clearance: 83.5 mL/min (by C-G formula based on SCr of 0.59 mg/dL). Liver Function Tests: Recent Labs  Lab 11/20/18 0400 11/21/18 0445 11/22/18 0115 11/23/18 0245 11/24/18 0240  AST 77* 50* 48* 54* 46*  ALT 63* 59* 53* 54* 50*  ALKPHOS 172* 180* 182* 190* 171*  BILITOT 0.3 0.5 0.4 0.3 0.3  PROT 6.0* 6.3* 6.3* 7.1 7.1  ALBUMIN 2.7* 3.0* 3.0* 3.1* 3.2*   No results for input(s): LIPASE, AMYLASE in the last 168 hours. No results for input(s): AMMONIA in the last 168 hours. Coagulation Profile: No results for input(s): INR, PROTIME in the last 168 hours. Cardiac Enzymes: No results for input(s): CKTOTAL, CKMB, CKMBINDEX, TROPONINI in the last 168 hours. BNP (last 3 results) No results for input(s): PROBNP in the last 8760 hours. HbA1C: No results for input(s): HGBA1C in the last 72 hours. CBG: Recent Labs  Lab 11/24/18 2331 11/25/18 0458 11/25/18 0724 11/25/18 1108 11/25/18 1529  GLUCAP 109* 132* 110* 85 112*   Lipid Profile: No results  for input(s): CHOL, HDL, LDLCALC, TRIG, CHOLHDL, LDLDIRECT in the last 72 hours. Thyroid Function Tests: No results for input(s): TSH, T4TOTAL, FREET4, T3FREE, THYROIDAB in the last 72 hours. Anemia Panel: No results for input(s): VITAMINB12, FOLATE, FERRITIN, TIBC, IRON, RETICCTPCT in the last 72 hours. Urine analysis:    Component Value Date/Time   COLORURINE STRAW (A) 11/01/2018 0901   APPEARANCEUR HAZY (A) 11/01/2018 0901   LABSPEC 1.010 11/01/2018 0901   PHURINE 5.0 11/01/2018 0901   GLUCOSEU NEGATIVE 11/01/2018 0901   HGBUR NEGATIVE 11/01/2018 0901   BILIRUBINUR NEGATIVE 11/01/2018 0901   Benjamin Stain  NEGATIVE 11/01/2018 0901   PROTEINUR NEGATIVE 11/01/2018 0901   NITRITE NEGATIVE 11/01/2018 0901   LEUKOCYTESUR NEGATIVE 11/01/2018 0901   Recent Results (from the past 240 hour(s))  Culture, respiratory (non-expectorated)     Status: None   Collection Time: 11/19/18 10:59 AM   Specimen: Tracheal Aspirate; Respiratory  Result Value Ref Range Status   Specimen Description   Final    TRACHEAL ASPIRATE Performed at [facility], [address] [place], [place] [address]    Special Requests   Final    NONE Performed at [facility], [address] [place], [place] [address]    Gram Stain   Final    MODERATE WBC PRESENT,BOTH PMN AND MONONUCLEAR FEW GRAM NEGATIVE RODS FEW GRAM POSITIVE COCCI RARE GRAM VARIABLE ROD RARE YEAST Performed at [facility], [address] [place], [place] [address]    Culture FEW PSEUDOMONAS AERUGINOSA  Final   Report Status 11/21/2018 FINAL  Final   Organism ID, Bacteria PSEUDOMONAS AERUGINOSA  Final      Susceptibility   Pseudomonas aeruginosa - MIC*    CEFTAZIDIME 4 SENSITIVE Sensitive     CIPROFLOXACIN <=0.25 SENSITIVE Sensitive     GENTAMICIN <=1 SENSITIVE Sensitive     IMIPENEM <=0.25 SENSITIVE Sensitive     PIP/TAZO <=4 SENSITIVE Sensitive     CEFEPIME <=1 SENSITIVE Sensitive     * FEW  PSEUDOMONAS AERUGINOSA      Radiology Studies: Dg Chest Port 1 View  Result Date: 11/25/2018 CLINICAL DATA:  ARDS, COVID-19 positive EXAM: PORTABLE CHEST 1 VIEW COMPARISON:  Portable exam 0508 hours compared to 11/19/2018 FINDINGS: Tracheostomy tube unchanged. Tip of RIGHT arm PICC line projects over cavoatrial junction. Feeding tube extends to at least the second portion of the duodenum. Stable heart size. Diffuse BILATERAL airspace infiltrates, unchanged. No pleural effusion or pneumothorax. IMPRESSION: Persistent diffuse BILATERAL airspace infiltrates Electronically Signed   By: Ulyses SouthwardMark  Boles M.D.   On: 11/25/2018 08:08    Scheduled Meds: . chlorhexidine  15 mL Mouth/Throat BID  . Chlorhexidine Gluconate Cloth  6 each Topical Daily  . furosemide  40 mg Per Tube Daily  . heparin injection (subcutaneous)  7,500 Units Subcutaneous Q8H  . insulin aspart  0-9 Units Subcutaneous Q4H  . mouth rinse  15 mL Mouth Rinse 10 times per day  . metoprolol tartrate  50 mg Per Tube BID  . multivitamin  15 mL Per Tube Daily  . oxyCODONE  5 mg Per Tube Q8H   Followed by  . [START ON 11/28/2018] oxyCODONE  5 mg Per Tube Q12H   Followed by  . [START ON 12/01/2018] oxyCODONE  5 mg Per Tube Daily  . sodium chloride flush  10-40 mL Intracatheter Q12H  . sodium chloride flush  3 mL Intravenous Q12H   Continuous Infusions: . sodium chloride 10 mL/hr at 11/25/18 1700  . cefTAZidime (FORTAZ)  IV Stopped (11/25/18 1348)  . feeding supplement (VITAL AF 1.2 CAL) 1,000 mL (11/24/18 2348)     LOS: 34 days   time spent: 35 minutes.  Erick BlinksJehanzeb Memon, MD [facility] [email or web address]  11/25/2018, 5:48 PM

## 2018-11-25 NOTE — Progress Notes (Signed)
NAME:  Shannon Meyer, MRN:  [id number], DOB:  March 30, 1966, LOS: 49 ADMISSION DATE:  10/22/2018, CONSULTATION DATE:  10/22/2018 REFERRING MD:  Sedonia Small, CHIEF COMPLAINT:  Dyspnea   Brief History   53 y/o female with no PMH admitted from [facility] on Oct 22, 2018 due to ARDS due to COVID 19 pneumonia.  Past Medical History  None  Significant Hospital Events   5/20 Intubated in ED and admitted to [facility] ICU 5/21-5/26 Weaning FIO2 and PEEP, diuresing 5/26-6/3 Tolerating weaning for 1-2 hours 6/7 weaning on pressure support ventilation again this morning 10/5 6/8 extubated, reintubated (tachypnea, tachycardia) 6/12 Trached 6/13 Trach collar all day  6/14 bleeding from tracheostomy site 6/17 48 hours off vent, fever > pseudomonas pneumonia 6/20 slightly more awake, remains off vent  Consults:  Pulmonary and critical care medicine  Procedures:  5/20 ET tube>6/11 Trach 6/11 >> 6/7 PICC line right arm>  Significant Diagnostic Tests:  Oct 28, 2018 CT angiogram chest no pulmonary embolism, bilateral atypical pneumonia consistent with COVID-19 CXR 6/23 - improving infiltrates Micro Data:  SARS-CoV-2 May 20+ Blood culture May 20 1 out of 2 GPC staph, coag negative resp 6/17 > pseudomonas aeruginosa  Antimicrobials:  Zithromax 5/20 >>5/24 Rocephin 5/20 >>5/26 Remdesivir 5/21 >5/30 Actemra 5/20>5/21 Solumedrol 5/12  > 5/26  Vanc 6/17 > x1 Zosyn 6/17 > 6/19 Ceftaz 6/17 >   Interim history/subjective:  Tachypnea appears mildly improved with reduction in carbohydrate load.  Objective   Blood pressure (!) 142/117, pulse (!) 102, temperature 99 F (37.2 C), temperature source Oral, resp. rate (!) 43, height 5\' 4"  (1.626 m), weight 84.5 kg, SpO2 91 %.        Intake/Output Summary (Last 24 hours) at 11/25/2018 1006 Last data filed at 11/25/2018 0800 Gross per 24 hour  Intake 1003.46 ml  Output 1200 ml  Net -196.54 ml   Filed Weights   11/21/18 0448 11/24/18 0500  11/25/18 0600  Weight: 86.5 kg 86.1 kg 84.5 kg   Examination:  General:  Well appearing, NAD HENT: Tracheostomy site intact.  PULM: Bilateral fine crackles with broncho-vesicular breathing on left. Mild increase in accessory muscle use with improvement in sitting position - less noticeable than yesterday. CV: Normal HS, extremities warm. GI: Soft, NT, ND and +BS MSK: normal bulk and tone. Trace edema. Neuro: Awake and following commands  Resolved Hospital Problem list     Assessment & Plan:   Resolving ARDS due to COVID-19 pneumonia Significant tachypnea limits progress and is likely related to still poor lung compliance. Normal ABG suggests tachypnea is in fact compensatory Overfeeding also possible. -Keep off ventilator (did not affect tachypnea in past) -Trial of permissive under feeding to see if improves respiratory rate. -Maintain even fluid balance. Additional iv furosemide today.  Delirium/ICU encephalopathy: improving Continue to wean sedatives Frequent re-orientation Progressive mobilization.  HCAP Pseudomonas pneumonia Complete 7 days of ceftazidime. Follow leukocytosis.  Unexplained leukocytosis Neutrophil predominance with multiple cell lines involved. Likely reactive. Observe for now as patient continues to improve clinically.  Severe physical deconditioning: Physical therapy/Occupational Therapy consult  Best practice:  Diet: Tube feeding as above. Pain/Anxiety/Delirium protocol (if indicated): RASS goal 0 VAP protocol (if indicated): Yes DVT prophylaxis: SCDs GI prophylaxis: Famotidine Glucose control: SSI Mobility: PT consult Code Status: Full Family Communication: will contact today Disposition: remain in ICU  Labs   CBC: Recent Labs  Lab 11/19/18 0200  11/21/18 0445 11/22/18 0115 11/22/18 2116 11/23/18 0245 11/24/18 0240 11/25/18 0155  WBC 18.8*   < >  20.5* 17.2*  --  21.3* 23.9* 25.0*  NEUTROABS 14.3*  --   --   --   --   --   --   15.4*  HGB 11.7*   < > 11.4* 12.1 13.6 12.7 12.7 12.7  HCT 39.7   < > 38.9 39.4 40.0 41.4 41.4 40.9  MCV 97.3   < > 98.0 95.6  --  95.2 95.2 95.1  PLT 260   < > 337 295  --  422* 435* PLATELET CLUMPS NOTED ON SMEAR, UNABLE TO ESTIMATE   < > = values in this interval not displayed.    Basic Metabolic Panel: Recent Labs  Lab 11/21/18 0445 11/22/18 0115 11/22/18 2116 11/23/18 0245 11/24/18 0240 11/25/18 0155  NA 141 139 137 139 141 143  K 3.5 3.8 3.6 4.0 3.8 3.6  CL 99 100  --  101 102 104  CO2 27 27  --  28 27 27   GLUCOSE 103* 111*  --  103* 98 115*  BUN 19 13  --  12 26* 27*  CREATININE 0.60 0.55  --  0.51 0.59 0.59  CALCIUM 8.8* 8.8*  --  9.1 9.4 9.2  MG  --   --   --   --  2.5*  --   PHOS  --   --   --   --  4.3  --    GFR: Estimated Creatinine Clearance: 83.5 mL/min (by C-G formula based on SCr of 0.59 mg/dL). Recent Labs  Lab 11/22/18 0115 11/23/18 0245 11/24/18 0240 11/25/18 0155  WBC 17.2* 21.3* 23.9* 25.0*    Liver Function Tests: Recent Labs  Lab 11/20/18 0400 11/21/18 0445 11/22/18 0115 11/23/18 0245 11/24/18 0240  AST 77* 50* 48* 54* 46*  ALT 63* 59* 53* 54* 50*  ALKPHOS 172* 180* 182* 190* 171*  BILITOT 0.3 0.5 0.4 0.3 0.3  PROT 6.0* 6.3* 6.3* 7.1 7.1  ALBUMIN 2.7* 3.0* 3.0* 3.1* 3.2*   No results for input(s): LIPASE, AMYLASE in the last 168 hours. No results for input(s): AMMONIA in the last 168 hours.  ABG    Component Value Date/Time   PHART 7.424 11/22/2018 2116   PCO2ART 41.5 11/22/2018 2116   PO2ART 65.0 (L) 11/22/2018 2116   HCO3 27.1 11/22/2018 2116   TCO2 28 11/22/2018 2116   ACIDBASEDEF 1.0 10/24/2018 0419   O2SAT 92.0 11/22/2018 2116     Coagulation Profile: No results for input(s): INR, PROTIME in the last 168 hours.  Cardiac Enzymes: No results for input(s): CKTOTAL, CKMB, CKMBINDEX, TROPONINI in the last 168 hours.  HbA1C: No results found for: HGBA1C  CBG: Recent Labs  Lab 11/24/18 1115 11/24/18 1545 11/24/18  2012 11/25/18 0458 11/25/18 0724  GLUCAP 93 90 107* 132* 110*   Lynnell Catalanavi Luvada Salamone, MD [facility] ICU Physician [facility]  Pager: [phone] Mobile: [phone] After hours: [phone].  11/25/2018, 10:06 AM

## 2018-11-25 NOTE — Progress Notes (Signed)
Daughter called and was updated. All questions are answered. Will do Facetime around 5 pm

## 2018-11-25 NOTE — Evaluation (Signed)
Clinical/Bedside Swallow Evaluation Patient Details  Name: Shannon Meyer MRN: [id number] Date of Birth: Oct 01, 1965  Today's Date: 11/25/2018 Time: SLP Start Time (ACUTE ONLY): 1027 SLP Stop Time (ACUTE ONLY): 1436 SLP Time Calculation (min) (ACUTE ONLY): 41 min  Past Medical History: History reviewed. No pertinent past medical history. Past Surgical History: History reviewed. No pertinent surgical history. HPI:  Pt adm 5/20 with hypoxic resp failure due to Covid 19. Pt intubated 5/20 and extubated 6/8, with re-intubation 6/8; Trach 6/11. PMH - none.    Assessment / Plan / Recommendation Clinical Impression  Pt's aspiration risk is currently high given her level of deconditioning, multiple prolonged intubations leading to trach with reduced ability to wear PMV, and overall decreased respiratory status. Upon initial assessment of oral cavity she appeared to have secretions pooled on her tongue, but upon better visualization, it appears as though pt may have bitten through her tongue at some point (RN and MD made aware; MD reports he will better assess when he rounds). Although she denies any pain, her lingual manipulation outwardly appears slow and she is easily distracted from completing her task of oral preparation and transit. Mod cues were given for pt to initiate a swallow response. Recommend additional dysphagia therapy with SLP only at this point. Will continue to follow for diagnostic trials and future potential to complete instrumental testing.  SLP Visit Diagnosis: Dysphagia, unspecified (R13.10)    Aspiration Risk  Moderate aspiration risk;Severe aspiration risk    Diet Recommendation NPO;Alternative means - temporary   Medication Administration: Via alternative means    Other  Recommendations Oral Care Recommendations: Oral care QID   Follow up Recommendations Inpatient Rehab      Frequency and Duration min 3x week  2 weeks       Prognosis Prognosis for Safe Diet  Advancement: Good Barriers to Reach Goals: Cognitive deficits      Swallow Study   General HPI: Pt adm 5/20 with hypoxic resp failure due to Covid 19. Pt intubated 5/20 and extubated 6/8, with re-intubation 6/8; Trach 6/11. PMH - none.  Type of Study: Bedside Swallow Evaluation Previous Swallow Assessment: none in chart Diet Prior to this Study: NPO;NG Tube Temperature Spikes Noted: No Respiratory Status: Trach;Nasal cannula Trach Size and Type: Cuff;#6;Deflated;With PMSV not in place History of Recent Intubation: Yes Length of Intubations (days): 24 days(across two intubations) Date extubated: (trach 6/11) Behavior/Cognition: Alert;Cooperative;Distractible;Requires cueing Oral Cavity Assessment: Other (comment)(see clinical impressions) Oral Care Completed by SLP: Yes Oral Cavity - Dentition: Adequate natural dentition Self-Feeding Abilities: Total assist Patient Positioning: Upright in chair Baseline Vocal Quality: Hoarse;Low vocal intensity Volitional Swallow: (needs cueing)    Oral/Motor/Sensory Function Overall Oral Motor/Sensory Function: (see clinical impressions)   Ice Chips Ice chips: Impaired Presentation: Spoon Pharyngeal Phase Impairments: Suspected delayed Swallow   Thin Liquid Thin Liquid: Not tested    Nectar Thick Nectar Thick Liquid: Not tested   Honey Thick Honey Thick Liquid: Not tested   Puree Puree: Not tested   Solid     Solid: Not tested      Shannon Meyer 11/25/2018,3:25 PM   Shannon Meyer, M.A. [place] Acute Environmental education officer [phone] Office [phone]

## 2018-11-26 DIAGNOSIS — Z95828 Presence of other vascular implants and grafts: Secondary | ICD-10-CM

## 2018-11-26 LAB — COMPREHENSIVE METABOLIC PANEL
ALT: 40 U/L (ref 0–44)
AST: 33 U/L (ref 15–41)
Albumin: 3 g/dL — ABNORMAL LOW (ref 3.5–5.0)
Alkaline Phosphatase: 155 U/L — ABNORMAL HIGH (ref 38–126)
Anion gap: 11 (ref 5–15)
BUN: 23 mg/dL — ABNORMAL HIGH (ref 6–20)
CO2: 26 mmol/L (ref 22–32)
Calcium: 8.8 mg/dL — ABNORMAL LOW (ref 8.9–10.3)
Chloride: 103 mmol/L (ref 98–111)
Creatinine, Ser: 0.41 mg/dL — ABNORMAL LOW (ref 0.44–1.00)
GFR calc Af Amer: >60 mL/min (ref >60)
GFR calc non Af Amer: >60 mL/min (ref >60)
Glucose, Bld: 115 mg/dL — ABNORMAL HIGH (ref 70–99)
Potassium: 3.1 mmol/L — ABNORMAL LOW (ref 3.5–5.1)
Sodium: 140 mmol/L (ref 135–145)
Total Bilirubin: 0.2 mg/dL — ABNORMAL LOW (ref 0.3–1.2)
Total Protein: 6.8 g/dL (ref 6.5–8.1)

## 2018-11-26 LAB — CBC
HCT: 42.3 % (ref 36.0–46.0)
Hemoglobin: 13 g/dL (ref 12.0–15.0)
MCH: 29.7 pg (ref 26.0–34.0)
MCHC: 30.7 g/dL (ref 30.0–36.0)
MCV: 96.8 fL (ref 80.0–100.0)
Platelets: 448 10*3/uL — ABNORMAL HIGH (ref 150–400)
RBC: 4.37 MIL/uL (ref 3.87–5.11)
RDW: 19 % — ABNORMAL HIGH (ref 11.5–15.5)
WBC: 21 10*3/uL — ABNORMAL HIGH (ref 4.0–10.5)
nRBC: 0.1 % (ref 0.0–0.2)

## 2018-11-26 LAB — GLUCOSE, CAPILLARY
Glucose-Capillary: 111 mg/dL — ABNORMAL HIGH (ref 70–99)
Glucose-Capillary: 114 mg/dL — ABNORMAL HIGH (ref 70–99)
Glucose-Capillary: 96 mg/dL (ref 70–99)
Glucose-Capillary: 105 mg/dL — ABNORMAL HIGH (ref 70–99)
Glucose-Capillary: 111 mg/dL — ABNORMAL HIGH (ref 70–99)

## 2018-11-26 MED ORDER — POTASSIUM CHLORIDE 10 MEQ/100ML IV SOLN
10.0000 meq | INTRAVENOUS | Status: AC
  Administered 2018-11-26 (×4): 10 meq via INTRAVENOUS
  Filled 2018-11-26: qty 100

## 2018-11-26 MED ORDER — HYDROMORPHONE HCL 1 MG/ML IJ SOLN: 0.5000 mg | Freq: Four times a day (QID) | INTRAMUSCULAR | Status: DC | PRN

## 2018-11-26 MED ORDER — FAMOTIDINE 40 MG/5ML PO SUSR
20.0000 mg | Freq: Every day | ORAL | Status: DC
  Administered 2018-11-26 – 2018-11-29 (×4): 20 mg
  Filled 2018-11-26 (×4): qty 2.5

## 2018-11-26 MED ORDER — NOREPINEPHRINE 4 MG/250ML-% IV SOLN: 0.0000 ug/min | INTRAVENOUS | Status: DC

## 2018-11-26 NOTE — Progress Notes (Signed)
NAME:  Shannon Meyer, MRN:  [id number], DOB:  1966/01/08, LOS: 39 ADMISSION DATE:  10/22/2018, CONSULTATION DATE:  10/22/2018 REFERRING MD:  Sedonia Small, CHIEF COMPLAINT:  Dyspnea   Brief History   53 y/o female with no PMH admitted from [facility] on Oct 22, 2018 due to ARDS due to COVID 19 pneumonia.  Past Medical History  None  Significant Hospital Events   5/20 Intubated in ED and admitted to [facility] ICU 5/21-5/26 Weaning FIO2 and PEEP, diuresing 5/26-6/3 Tolerating weaning for 1-2 hours 6/7 weaning on pressure support ventilation again this morning 10/5 6/8 extubated, reintubated (tachypnea, tachycardia) 6/12 Trached 6/13 Trach collar all day  6/14 bleeding from tracheostomy site 6/17 48 hours off vent, fever > pseudomonas pneumonia 6/20 slightly more awake, remains off vent  Consults:  Pulmonary and critical care medicine  Procedures:  5/20 ET tube>6/11 Trach 6/11 >> 6/7 PICC line right arm>  Significant Diagnostic Tests:  Oct 28, 2018 CT angiogram chest no pulmonary embolism, bilateral atypical pneumonia consistent with COVID-19 CXR 6/23 - improving infiltrates Micro Data:  SARS-CoV-2 May 20+ Blood culture May 20 1 out of 2 GPC staph, coag negative resp 6/17 > pseudomonas aeruginosa  Antimicrobials:  Zithromax 5/20 >>5/24 Rocephin 5/20 >>5/26 Remdesivir 5/21 >5/30 Actemra 5/20>5/21 Solumedrol 5/12  > 5/26  Vanc 6/17 > x1 Zosyn 6/17 > 6/19 Ceftaz 6/17 >   Interim history/subjective:  Tachypnea appears mildly improved with reduction in carbohydrate load. Still somnolent but following commands and mobilizes to chair with assistance.  Objective   Blood pressure 139/77, pulse 100, temperature 98.7 F (37.1 C), temperature source Oral, resp. rate (!) 36, height 5\' 4"  (1.626 m), weight 84.6 kg, SpO2 94 %.        Intake/Output Summary (Last 24 hours) at 11/26/2018 1027 Last data filed at 11/26/2018 0600 Gross per 24 hour  Intake 727.4 ml  Output 650  ml  Net 77.4 ml   Filed Weights   11/24/18 0500 11/25/18 0600 11/26/18 0500  Weight: 86.1 kg 84.5 kg 84.6 kg   Examination:  General:  Well appearing, NAD HENT: Tracheostomy site intact.  PULM: Bilateral fine crackles with broncho-vesicular breathing on left. Mild increase in accessory muscle use. CV: Normal HS, extremities warm. GI: Soft, NT, ND and +BS MSK: normal bulk and tone. Trace edema. Neuro: Awake and following commands. Generalized weakness with poor truncal control.  Resolved Hospital Problem list     Assessment & Plan:   Resolving ARDS due to COVID-19 pneumonia Significant tachypnea limits progress and is likely related to still poor lung compliance. Normal ABG suggests tachypnea is in fact compensatory Overfeeding also possible. -Keep off ventilator (did not affect tachypnea in past) -Trial of permissive under feeding to see if improves respiratory rate. -Maintain even fluid balance. Additional iv furosemide today.  Delirium/ICU encephalopathy: improving Continue to wean oxycodone Frequent re-orientation Progressive mobilization. May benefit from melatonin to promote restorative sleep.  HCAP Pseudomonas pneumonia Complete 7 days of ceftazidime. Follow leukocytosis.  Unexplained leukocytosis Neutrophil predominance with multiple cell lines involved. Likely reactive. Observe for now as patient continues to improve clinically.  Severe physical deconditioning: Physical therapy/Occupational Therapy consult  Best practice:  Diet: Tube feeding as above. Pain/Anxiety/Delirium protocol (if indicated): RASS goal 0 VAP protocol (if indicated): Yes DVT prophylaxis: SCDs GI prophylaxis: Famotidine Glucose control: SSI Mobility: PT consult Code Status: Full Family Communication: will contact today Disposition: remain in ICU  Labs   CBC: Recent Labs  Lab 11/22/18 0115 11/22/18 2116 11/23/18  0245 11/24/18 0240 11/25/18 0155 11/26/18 0518  WBC 17.2*   --  21.3* 23.9* 25.0* 21.0*  NEUTROABS  --   --   --   --  15.4*  --   HGB 12.1 13.6 12.7 12.7 12.7 13.0  HCT 39.4 40.0 41.4 41.4 40.9 42.3  MCV 95.6  --  95.2 95.2 95.1 96.8  PLT 295  --  422* 435* PLATELET CLUMPS NOTED ON SMEAR, UNABLE TO ESTIMATE 448*    Basic Metabolic Panel: Recent Labs  Lab 11/22/18 0115 11/22/18 2116 11/23/18 0245 11/24/18 0240 11/25/18 0155 11/26/18 0518  NA 139 137 139 141 143 140  K 3.8 3.6 4.0 3.8 3.6 3.1*  CL 100  --  101 102 104 103  CO2 27  --  28 27 27 26   GLUCOSE 111*  --  103* 98 115* 115*  BUN 13  --  12 26* 27* 23*  CREATININE 0.55  --  0.51 0.59 0.59 0.41*  CALCIUM 8.8*  --  9.1 9.4 9.2 8.8*  MG  --   --   --  2.5*  --   --   PHOS  --   --   --  4.3  --   --    GFR: Estimated Creatinine Clearance: 83.7 mL/min (A) (by C-G formula based on SCr of 0.41 mg/dL (L)). Recent Labs  Lab 11/23/18 0245 11/24/18 0240 11/25/18 0155 11/26/18 0518  WBC 21.3* 23.9* 25.0* 21.0*    Liver Function Tests: Recent Labs  Lab 11/21/18 0445 11/22/18 0115 11/23/18 0245 11/24/18 0240 11/26/18 0518  AST 50* 48* 54* 46* 33  ALT 59* 53* 54* 50* 40  ALKPHOS 180* 182* 190* 171* 155*  BILITOT 0.5 0.4 0.3 0.3 0.2*  PROT 6.3* 6.3* 7.1 7.1 6.8  ALBUMIN 3.0* 3.0* 3.1* 3.2* 3.0*   No results for input(s): LIPASE, AMYLASE in the last 168 hours. No results for input(s): AMMONIA in the last 168 hours.  ABG    Component Value Date/Time   PHART 7.424 11/22/2018 2116   PCO2ART 41.5 11/22/2018 2116   PO2ART 65.0 (L) 11/22/2018 2116   HCO3 27.1 11/22/2018 2116   TCO2 28 11/22/2018 2116   ACIDBASEDEF 1.0 10/24/2018 0419   O2SAT 92.0 11/22/2018 2116     Coagulation Profile: No results for input(s): INR, PROTIME in the last 168 hours.  Cardiac Enzymes: No results for input(s): CKTOTAL, CKMB, CKMBINDEX, TROPONINI in the last 168 hours.  HbA1C: No results found for: HGBA1C  CBG: Recent Labs  Lab 11/25/18 1529 11/25/18 2214 11/25/18 2348 11/26/18  0307 11/26/18 0809  GLUCAP 112* 117* 100* 111* 114*   Shannon Catalanavi Loren Vicens, MD [facility] ICU Physician [facility]  Pager: [phone] Mobile: [phone] After hours: [phone].  11/26/2018, 10:27 AM

## 2018-11-26 NOTE — Progress Notes (Signed)
Daughter [name] called for updates. Told her she is sleeping soundly right now on 6L TC and has not required any PRNs this evening.

## 2018-11-26 NOTE — Progress Notes (Signed)
Facetime call done with patient's daughter [name]).  All questions were answered and patient was able to speak to her tonight with PMV on.

## 2018-11-26 NOTE — Progress Notes (Signed)
Physical Therapy Treatment Patient Details Name: Shannon BradfordDeborah Meyer MRN: [id number] DOB: 02/12/1966 Today's Date: 11/26/2018    History of Present Illness Pt adm 5/20 with hypoxic resp failure due to Covid 19. Pt intubated 5/20 and extubated 6/8, with re-intubation 6/8; Trach 6/11. PMH - none. 6/17 foul smelling trach site/secretions, elevated WBC and temp; vomited tube feeds.    PT Comments    The patient was quite lively today, moving all extremities, able to attend to right, read Numbers on monitor while sitting. Placed PMV with patient aattempting to speak. Patient sits briefly with no support, then drifts posteriorly. SaO2 on HFNC  At 8 Liters with sats down to 85%. , fluctuating to 91 %. HR 100's. Patient is progressing. Will attempt standing as able.   Follow Up Recommendations  CIR     Equipment Recommendations  Other (comment)    Recommendations for Other Services       Precautions / Restrictions Precautions Precaution Comments: HFNC 8 L and PMV    Mobility  Bed Mobility   Bed Mobility: Rolling;Sidelying to Sit;Sit to Sidelying Rolling: Min assist Sidelying to sit: Mod assist;Max assist     Sit to sidelying: Mod assist General bed mobility comments: extra time and multimodal cues for rolling, sitting up, assisted  legs onto bed at end. Patient did lay herself back onto bed with legs hanging over edge.  Transfers                    Ambulation/Gait                 Stairs             Wheelchair Mobility    Modified Rankin (Stroke Patients Only)       Balance   Sitting-balance support: Feet unsupported;Bilateral upper extremity supported   Sitting balance - Comments: Sat EOB @ 8 min. with arms/elbows propped on bedside table, cues to look up, tends to look down. SaO522monitored, 85%.                                    Cognition Arousal/Alertness: Awake/alert Behavior During Therapy: Restless Overall Cognitive Status:  Impaired/Different from baseline Area of Impairment: Orientation;Attention;Memory;Following commands;Safety/judgement;Problem solving;Awareness                   Current Attention Level: Sustained;Focused         Problem Solving: Slow processing General Comments: oriented to hospital, covid, follows with increased time.very active with extremities, " dancing"      Exercises      General Comments        Pertinent Vitals/Pain Faces Pain Scale: No hurt    Home Living                      Prior Function            PT Goals (current goals can now be found in the care plan section) Acute Rehab PT Goals Time For Goal Achievement: 12/10/18 Progress towards PT goals: Progressing toward goals    Frequency    Min 3X/week      PT Plan Current plan remains appropriate    Co-evaluation              AM-PAC PT "6 Clicks" Mobility   Outcome Measure  Help needed turning from your back to your side while in a flat bed  without using bedrails?: Total Help needed moving from lying on your back to sitting on the side of a flat bed without using bedrails?: Total Help needed moving to and from a bed to a chair (including a wheelchair)?: Total Help needed standing up from a chair using your arms (e.g., wheelchair or bedside chair)?: Total Help needed to walk in hospital room?: Total Help needed climbing 3-5 steps with a railing? : Total 6 Click Score: 6    End of Session Equipment Utilized During Treatment: Oxygen Activity Tolerance: Patient tolerated treatment well Patient left: in bed;with call bell/phone within reach;with nursing/sitter in room Nurse Communication: Mobility status PT Visit Diagnosis: Other abnormalities of gait and mobility (R26.89);Muscle weakness (generalized) (M62.81)     Time: 1420-1500 PT Time Calculation (min) (ACUTE ONLY): 40 min  Charges:  $Therapeutic Activity: 38-52 mins                     Tresa Endo PT Acute  Rehabilitation Services Pager [phone] Office [phone]    Claretha Cooper 11/26/2018, 5:51 PM

## 2018-11-26 NOTE — Progress Notes (Signed)
PROGRESS NOTE                                                                                                                                                                                                             Patient Demographics:    Shannon Meyer, is a 53 y.o. female, DOB - 01/30/1966, PYK:[id number]  Outpatient Primary MD for the patient is No primary care provider on file.    LOS - 12  Admit date - 10/22/2018    Chief Complaint  Patient presents with   Weakness       Brief Narrative  Shannon Meyer is a 53 y.o. female with no significant medical history who presented with cough, weakness and shortness of breath found to have covid-19 with bilateral CXR opacities, intubated in ED and admitted to [facility] 5/20. She has had a prolonged hospital course. She was unable to wean from ventilator and underwent tracheostomy. She began developing fevers and found to have pseudomonas in her sputum. She is currently on antibiotics. Still has significant leukocytosis that is being monitored. Weaning off oxygen. Plan is for CIR/SNF/LTAC once stable.   Subjective:    Shannon Meyer today in ICU wearing trach collar, denies any headache chest or abdominal pain.  Denies any discomfort.   Assessment  & Plan :     1. Acute Hypoxic Resp. Failure due to Acute Covid 19 Viral Pneumonitis during the ongoing 2020 Covid 19 Pandemic - she has completed remdesivir 5/21 - 5/30, received actemra 5/20, and 5/21. Received CTX 5/20-5/26, azithromycin 5/20-5/24. Needed to be intubated subsequently trached after she failed extubation on 11/10/2018, currently on trach collar.  Requiring 4 to 5 L oxygen per minute.  Continue supportive care.     ABG     Component Value Date/Time   PHART 7.424 11/22/2018 2116   PCO2ART 41.5 11/22/2018 2116   PO2ART 65.0 (L) 11/22/2018 2116   HCO3 27.1 11/22/2018 2116   TCO2 28 11/22/2018 2116   ACIDBASEDEF 1.0  10/24/2018 0419   O2SAT 92.0 11/22/2018 2116    COVID-19 Labs  No results for input(s): DDIMER, FERRITIN, LDH, CRP in the last 72 hours.  Lab Results  Component Value Date   SARSCOV2NAA POSITIVE (A) 10/22/2018     Hepatic Function Latest Ref Rng & Units 11/26/2018 11/24/2018 11/23/2018  Total Protein 6.5 - 8.1  g/dL 6.8 7.1 7.1  Albumin 3.5 - 5.0 g/dL 3.0(L) 3.2(L) 3.1(L)  AST 15 - 41 U/L 33 46(H) 54(H)  ALT 0 - 44 U/L 40 50(H) 54(H)  Alk Phosphatase 38 - 126 U/L 155(H) 171(H) 190(H)  Total Bilirubin 0.3 - 1.2 mg/dL 0.2(L) 0.3 0.3        Component Value Date/Time   BNP 130.2 (H) 11/14/2018 1555      2.  Pseudomonas pneumonia -  Currently on Fortaz and clinically improving.  Continue for now.  Leukocytosis has improved and she is currently afebrile.  Will give a total of 7 days of treatment then stop.  3.  ICU delirium, toxic encephalopathy.  Minimize sedatives and narcotics, Seroquel nightly, melatonin nightly to improve sleep pattern.  Expose to sunlight, up in chair, PT.  4.  Sinus tachycardia with questionable brief run of A. fib for a few minutes morning of 11/26/2018.  This would be a single isolated episode, no previous echo on file, will check TSH, currently on beta-blocker, will get a EKG, continue heparin for DVT prophylaxis.  If A. fib is confirmed or reoccurs will place on anticoagulation as well and get an echocardiogram.  CTA chest rules out PE.  5.  Hypokalemia.  Replaced.  6. 1 Out of 4 blood culture contamination.  Stable monitor.  7.  Obesity with a BMI of 34.  Outpatient follow-up with PCP.      Condition - Extremely Guarded  Family Communication  : None present  Code Status : Full code  Diet : Tube feeds  Diet Order            Diet NPO time specified  Diet effective midnight               ICU  Consults  :  PCCM  Procedures  :   5/20 Intubated in ED and admitted to [facility] ICU 5/21-5/26 Weaning FIO2 and PEEP,  diuresing 5/26-6/3 Tolerating weaning for 1-2 hours 6/7 weaning on pressure support ventilation again this morning 10/5 6/8 extubated, reintubated (tachypnea, tachycardia) 6/12 Trached 6/13 Trach collar all day  6/14 bleeding from tracheostomy site 6/17 48 hours off vent, fever > pseudomonas pneumonia 6/20 slightly more awake, remains off vent  PUD Prophylaxis : Famotidine   DVT Prophylaxis  :    Heparin    Lab Results  Component Value Date   PLT 448 (H) 11/26/2018    Inpatient Medications  Scheduled Meds:  chlorhexidine  15 mL Mouth/Throat BID   Chlorhexidine Gluconate Cloth  6 each Topical Daily   furosemide  40 mg Per Tube Daily   heparin injection (subcutaneous)  7,500 Units Subcutaneous Q8H   insulin aspart  0-9 Units Subcutaneous Q4H   mouth rinse  15 mL Mouth Rinse 10 times per day   metoprolol tartrate  50 mg Per Tube BID   multivitamin  15 mL Per Tube Daily   oxyCODONE  5 mg Per Tube Q8H   Followed by   [START ON 11/28/2018] oxyCODONE  5 mg Per Tube Q12H   Followed by   [START ON 12/01/2018] oxyCODONE  5 mg Per Tube Daily   sodium chloride flush  10-40 mL Intracatheter Q12H   Continuous Infusions:  sodium chloride Stopped (11/26/18 1054)   cefTAZidime (FORTAZ)  IV Stopped (11/26/18 0615)   feeding supplement (VITAL AF 1.2 CAL) 1,000 mL (11/24/18 2348)   potassium chloride 10 mEq (11/26/18 1054)   PRN Meds:.acetaminophen, hydrALAZINE, HYDROmorphone (DILAUDID) injection, ipratropium-albuterol, metoprolol  tartrate, ondansetron (ZOFRAN) IV, polyethylene glycol, QUEtiapine, sennosides  Antibiotics  :    Anti-infectives (From admission, onward)   Start     Dose/Rate Route Frequency Ordered Stop   11/21/18 2200  cefTAZidime (FORTAZ) 2 g in sodium chloride 0.9 % 100 mL IVPB     2 g 200 mL/hr over 30 Minutes Intravenous Every 8 hours 11/21/18 1426     11/21/18 1400  cefTAZidime (FORTAZ) 2 g in sodium chloride 0.9 % 100 mL IVPB  Status:  Discontinued      2 g 200 mL/hr over 30 Minutes Intravenous Every 8 hours 11/21/18 1330 11/21/18 1426   11/20/18 0400  vancomycin (VANCOCIN) IVPB 1000 mg/200 mL premix  Status:  Discontinued     1,000 mg 200 mL/hr over 60 Minutes Intravenous Every 12 hours 11/19/18 1517 11/20/18 1344   11/19/18 1530  vancomycin (VANCOCIN) 1,500 mg in sodium chloride 0.9 % 500 mL IVPB     1,500 mg 250 mL/hr over 120 Minutes Intravenous  Once 11/19/18 1517 11/19/18 1744   11/19/18 1530  piperacillin-tazobactam (ZOSYN) IVPB 3.375 g     3.375 g 12.5 mL/hr over 240 Minutes Intravenous Every 8 hours 11/19/18 1517 11/21/18 1734   11/01/18 1000  fluconazole (DIFLUCAN) IVPB 100 mg     100 mg 50 mL/hr over 60 Minutes Intravenous Every 24 hours 11/01/18 0857 11/03/18 1137   10/27/18 2200  cefTRIAXone (ROCEPHIN) 2 g in sodium chloride 0.9 % 100 mL IVPB     2 g 200 mL/hr over 30 Minutes Intravenous Every 24 hours 10/27/18 0835 10/28/18 2243   10/24/18 1400  remdesivir 100 mg in sodium chloride 0.9 % 250 mL IVPB  Status:  Discontinued     100 mg over 30 Minutes Intravenous Every 24 hours 10/23/18 1153 10/24/18 1335   10/24/18 1400  remdesivir 100 mg in sodium chloride 0.9 % 220 mL IVPB  Status:  Discontinued     100 mg over 30 Minutes Intravenous Every 24 hours 10/24/18 1334 10/24/18 1336   10/24/18 1400  remdesivir 100 mg in sodium chloride 0.9 % 230 mL IVPB     100 mg over 30 Minutes Intravenous Every 24 hours 10/24/18 1335 11/01/18 1500   10/24/18 1400  remdesivir 100 mg in sodium chloride 0.9 % 230 mL IVPB  Status:  Discontinued     100 mg over 30 Minutes Intravenous Every 24 hours 10/24/18 1337 10/24/18 1339   10/23/18 1400  remdesivir 200 mg in sodium chloride 0.9 % 250 mL IVPB     200 mg over 30 Minutes Intravenous Once 10/23/18 1153 10/23/18 1738   10/22/18 2200  azithromycin (ZITHROMAX) 500 mg in sodium chloride 0.9 % 250 mL IVPB     500 mg 250 mL/hr over 60 Minutes Intravenous Every 24 hours 10/22/18 2148 10/26/18  2255   10/22/18 2200  cefTRIAXone (ROCEPHIN) 1 g in sodium chloride 0.9 % 100 mL IVPB  Status:  Discontinued     1 g 200 mL/hr over 30 Minutes Intravenous Every 24 hours 10/22/18 2148 10/27/18 0835       Time Spent in minutes  Neligh M.D on 11/26/2018 at 11:16 AM  To page go to [email or web address] - password [facility]  [facility] -  Office  [phone]     See all Orders from today for further details    Objective:   Vitals:   11/26/18 0735 11/26/18 0800 11/26/18 0900 11/26/18 1000  BP: (!) 176/105 139/77 (!) 146/80 140/83  Pulse: 95 100 (!) 104 99  Resp: (!) 45 (!) 36 (!) 34 (!) 46  Temp:  98.7 F (37.1 C)    TempSrc:  Oral    SpO2: 90% 94% 97% 96%  Weight:      Height:        Wt Readings from Last 3 Encounters:  11/26/18 84.6 kg     Intake/Output Summary (Last 24 hours) at 11/26/2018 1116 Last data filed at 11/26/2018 1054 Gross per 24 hour  Intake 1120.59 ml  Output 650 ml  Net 470.59 ml     Physical Exam  Awake will answer basic questions, trach collar in place, NG tube in place, pubic catheter along with right arm PICC line in place, slightly tachypneic. [place].AT,PERRAL Supple Neck,No JVD, No cervical lymphadenopathy appriciated.  Symmetrical Chest wall movement, Good air movement bilaterally, CTAB RRR,No Gallops,Rubs or new Murmurs, No Parasternal Heave +ve B.Sounds, Abd Soft, No tenderness, No organomegaly appriciated, No rebound - guarding or rigidity. No Cyanosis, Clubbing or edema, No new Rash or bruise       Data Review:    CBC Recent Labs  Lab 11/22/18 0115 11/22/18 2116 11/23/18 0245 11/24/18 0240 11/25/18 0155 11/26/18 0518  WBC 17.2*  --  21.3* 23.9* 25.0* 21.0*  HGB 12.1 13.6 12.7 12.7 12.7 13.0  HCT 39.4 40.0 41.4 41.4 40.9 42.3  PLT 295  --  422* 435* PLATELET CLUMPS NOTED ON SMEAR, UNABLE TO ESTIMATE 448*  MCV 95.6  --  95.2 95.2 95.1 96.8  MCH 29.4  --  29.2 29.2 29.5 29.7  MCHC 30.7  --  30.7 30.7 31.1 30.7  RDW  17.7*  --  17.7* 18.6* 18.9* 19.0*  LYMPHSABS  --   --   --   --  4.8*  --   MONOABS  --   --   --   --  2.2*  --   EOSABS  --   --   --   --  2.1*  --   BASOSABS  --   --   --   --  0.1  --     Chemistries  Recent Labs  Lab 11/21/18 0445 11/22/18 0115 11/22/18 2116 11/23/18 0245 11/24/18 0240 11/25/18 0155 11/26/18 0518  NA 141 139 137 139 141 143 140  K 3.5 3.8 3.6 4.0 3.8 3.6 3.1*  CL 99 100  --  101 102 104 103  CO2 27 27  --  '28 27 27 26  ' GLUCOSE 103* 111*  --  103* 98 115* 115*  BUN 19 13  --  12 26* 27* 23*  CREATININE 0.60 0.55  --  0.51 0.59 0.59 0.41*  CALCIUM 8.8* 8.8*  --  9.1 9.4 9.2 8.8*  MG  --   --   --   --  2.5*  --   --   AST 50* 48*  --  54* 46*  --  33  ALT 59* 53*  --  54* 50*  --  40  ALKPHOS 180* 182*  --  190* 171*  --  155*  BILITOT 0.5 0.4  --  0.3 0.3  --  0.2*   ------------------------------------------------------------------------------------------------------------------ No results for input(s): CHOL, HDL, LDLCALC, TRIG, CHOLHDL, LDLDIRECT in the last 72 hours.  No results found for: HGBA1C ------------------------------------------------------------------------------------------------------------------ No results for input(s): TSH, T4TOTAL, T3FREE, THYROIDAB in the last 72 hours.  Invalid input(s): FREET3  Cardiac Enzymes No results for input(s): CKMB, TROPONINI, MYOGLOBIN in the last 168 hours.  Invalid input(s): CK ------------------------------------------------------------------------------------------------------------------    Component Value Date/Time   BNP 130.2 (H) 11/14/2018 1555    Micro Results Recent Results (from the past 240 hour(s))  Culture, respiratory (non-expectorated)     Status: None   Collection Time: 11/19/18 10:59 AM   Specimen: Tracheal Aspirate; Respiratory  Result Value Ref Range Status   Specimen Description   Final    TRACHEAL ASPIRATE Performed at [facility] [address], [place], [place] [address]    Special Requests   Final    NONE Performed at [facility] [place] [address], [place], [place] [address]    Gram Stain   Final    MODERATE WBC PRESENT,BOTH PMN AND MONONUCLEAR FEW GRAM NEGATIVE RODS FEW GRAM POSITIVE COCCI RARE GRAM VARIABLE ROD RARE YEAST Performed at [facility], [place] [address] [place],  [address]    Culture FEW PSEUDOMONAS AERUGINOSA  Final   Report Status 11/21/2018 FINAL  Final   Organism ID, Bacteria PSEUDOMONAS AERUGINOSA  Final      Susceptibility   Pseudomonas aeruginosa - MIC*    CEFTAZIDIME 4 SENSITIVE Sensitive     CIPROFLOXACIN <=0.25 SENSITIVE Sensitive     GENTAMICIN <=1 SENSITIVE Sensitive     IMIPENEM <=0.25 SENSITIVE Sensitive     PIP/TAZO <=4 SENSITIVE Sensitive     CEFEPIME <=1 SENSITIVE Sensitive     * FEW PSEUDOMONAS AERUGINOSA    Radiology Reports Dg Abd 1 View  Result Date: 11/10/2018 CLINICAL DATA:  Orogastric placement EXAM: ABDOMEN - 1 VIEW COMPARISON:  None. FINDINGS: Orogastric tube enters the stomach, loops in the body and has its tip in the antrum. Bowel gas pattern appears normal. IMPRESSION: Orogastric tip in the antrum. Electronically Signed   By: Nelson Chimes M.D.   On: 11/10/2018 16:50   Dg Abd 1 View  Result Date: 10/27/2018 CLINICAL DATA:  Feeding tube adjusted EXAM: ABDOMEN - 1 VIEW COMPARISON:  10/27/2018 FINDINGS: Interval repositioning of a non weighted esophagogastric tube, with the tip in the vicinity of the the duodenal bulb and the side-port in the vicinity of the pylorus. Nonobstructive pattern of bowel gas with gas present to the rectum. IMPRESSION: Interval repositioning of a non weighted esophagogastric tube, with the tip in the vicinity of the the duodenal bulb and the side-port in the vicinity of the pylorus. Nonobstructive pattern of bowel gas with gas present to the rectum. Electronically Signed   By: Eddie Candle M.D.   On: 10/27/2018 15:22   Dg Abd  1 View  Result Date: 10/27/2018 CLINICAL DATA:  Feeding tube. EXAM: ABDOMEN - 1 VIEW COMPARISON:  Chest radiograph 10/27/2018 at 0444 hours FINDINGS: An enteric tube loops back upon itself in the gastric body and courses superiorly into the esophagus with tip not imaged, however this was included on today's earlier chest radiograph where the tube had a similar configuration and terminated over the lower half of the thoracic esophagus. The side hole is near the GE junction. Gas is present in nondilated bowel loops without evidence of obstruction. No acute osseous abnormality is seen. IMPRESSION: Enteric tube looped upon itself in the stomach with tip in the esophagus, incompletely imaged. Recommend retracting the tube and reimaging. Electronically Signed   By: Logan Bores M.D.   On: 10/27/2018 11:51   Ct Angio Chest Pe W Or Wo Contrast  Result Date: 10/28/2018 CLINICAL DATA:  Increasing hypoxemia EXAM: CT ANGIOGRAPHY CHEST WITH CONTRAST TECHNIQUE: Multidetector CT imaging of  the chest was performed using the standard protocol during bolus administration of intravenous contrast. Multiplanar CT image reconstructions and MIPs were obtained to evaluate the vascular anatomy. CONTRAST:  167m OMNIPAQUE IOHEXOL 350 MG/ML SOLN COMPARISON:  None. FINDINGS: Cardiovascular: Examination of the pulmonary arteries is limited by breath motion artifact. Within this limitation, no evidence of embolism through the proximal segmental pulmonary arterial level. Normal heart size. No pericardial effusion. Mediastinum/Nodes: No enlarged mediastinal, hilar, or axillary lymph nodes. Thyroid gland, trachea, and esophagus demonstrate no significant findings. Lungs/Pleura: Endotracheal tube is positioned with tip above the carina. Diffuse, extensive bilateral ground-glass pulmonary opacity and mild bibasilar consolidation and/or atelectasis. No pleural effusion or pneumothorax. Upper Abdomen: No acute abnormality. Esophagogastric tube  with tip and side port below the diaphragm. Musculoskeletal: No chest wall abnormality. No acute or significant osseous findings. Review of the MIP images confirms the above findings. IMPRESSION: 1. Examination of the pulmonary arteries is limited by breath motion artifact. Within this limitation, no evidence of embolism through the proximal segmental pulmonary arterial level. 2. Diffuse, extensive bilateral ground-glass pulmonary opacity and mild bibasilar consolidation and/or atelectasis, consistent with known diagnosis of COVID-19 infection. 3. Endotracheal tube and esophagogastric tube in satisfactory position. Electronically Signed   By: AEddie CandleM.D.   On: 10/28/2018 12:38   Dg Chest Port 1 View  Result Date: 11/25/2018 CLINICAL DATA:  ARDS, COVID-19 positive EXAM: PORTABLE CHEST 1 VIEW COMPARISON:  Portable exam 0508 hours compared to 11/19/2018 FINDINGS: Tracheostomy tube unchanged. Tip of RIGHT arm PICC line projects over cavoatrial junction. Feeding tube extends to at least the second portion of the duodenum. Stable heart size. Diffuse BILATERAL airspace infiltrates, unchanged. No pleural effusion or pneumothorax. IMPRESSION: Persistent diffuse BILATERAL airspace infiltrates Electronically Signed   By: MLavonia DanaM.D.   On: 11/25/2018 08:08   Dg Chest Port 1 View  Result Date: 11/19/2018 CLINICAL DATA:  Fever EXAM: PORTABLE CHEST 1 VIEW COMPARISON:  11/13/2018, 11/11/2018 FINDINGS: Tracheostomy tube in satisfactory position. Feeding tube coiled in the stomach. Right-sided PICC line with the tip projecting over the SVC. Worsening bilateral patchy interstitial and alveolar airspace disease throughout bilateral lungs concerning for multilobar pneumonia. No pleural effusion or pneumothorax. Stable cardiomediastinal silhouette. No aggressive osseous lesion. IMPRESSION: 1. Support lines and tubing in satisfactory position. 2. Worsening bilateral patchy interstitial and alveolar airspace disease  throughout bilateral lungs concerning for multilobar pneumonia. Electronically Signed   By: HKathreen Devoid  On: 11/19/2018 13:38   Dg Chest Port 1 View  Result Date: 11/13/2018 CLINICAL DATA:  Tracheostomy placement.  COVID-19. EXAM: PORTABLE CHEST 1 VIEW COMPARISON:  11/11/2018. FINDINGS: Tracheostomy is been inserted. Tip lies 4.5 cm above carina. BILATERAL pulmonary opacities persist, LEFT greater than RIGHT, not significantly improved. Cardiomegaly. Feeding tube duodenum. IMPRESSION: Satisfactory tracheostomy placement. No significant improvement aeration. Electronically Signed   By: JStaci RighterM.D.   On: 11/13/2018 16:16   Dg Chest Port 1 View  Result Date: 11/11/2018 CLINICAL DATA:  Follow-up aspiration EXAM: PORTABLE CHEST 1 VIEW COMPARISON:  11/10/2018 FINDINGS: Cardiac shadow is stable. Endotracheal tube and gastric catheter are noted in satisfactory position. Right-sided PICC line is noted at the cavoatrial junction. Patchy infiltrates are again seen bilaterally stable from the prior exam. No pneumothorax is noted. IMPRESSION: No change from the previous day. Electronically Signed   By: MInez CatalinaM.D.   On: 11/11/2018 07:52   Dg Chest Port 1 View  Result Date: 11/10/2018 CLINICAL DATA:  Intubation.  OG tube  placement. EXAM: PORTABLE CHEST 1 VIEW COMPARISON:  November 09, 2018 FINDINGS: The ETT remains in good position, 2 cm above the carina. Bilateral pulmonary infiltrates remain, similar on the left and more prominent on the right in the interval. The OG tube terminates below today's film. No other changes. IMPRESSION: Support apparatus as above. Bilateral pulmonary infiltrates are stable on the left and mildly more prominent on the right in the interval. Electronically Signed   By: Dorise Bullion III M.D   On: 11/10/2018 16:27   Dg Chest Port 1 View  Result Date: 11/09/2018 CLINICAL DATA:  52 year old female positive COVID-19 status post PICC placement. EXAM: PORTABLE CHEST 1 VIEW  COMPARISON:  Chest radiograph dated 11/04/2018 FINDINGS: A right-sided PICC is noted with tip over the right subclavian vein. Recommend further advancing by an additional approximately 10 cm. Endotracheal tube remains above the carina in similar position. Enteric tube extends below the diaphragm likely within the stomach. Interval progression of bilateral confluent densities since the prior radiograph. No large pleural effusion. There is no pneumothorax. No acute osseous pathology. Stable cardiac silhouette. IMPRESSION: 1. Right-sided PICC with tip over the right subclavian vein. Recommend further advancing an additional 10 cm. 2. Interval progression of bilateral confluent densities. Electronically Signed   By: Anner Crete M.D.   On: 11/09/2018 03:25   Dg Chest Port 1 View  Result Date: 11/04/2018 CLINICAL DATA:  Acute respiratory failure EXAM: PORTABLE CHEST 1 VIEW COMPARISON:  10/31/2018 FINDINGS: Cardiac shadow is stable. Endotracheal tube, right-sided PICC line and gastric catheter are noted in satisfactory position. Multifocal infiltrates are identified bilaterally left greater than right stable from the prior exam. No bony abnormality is seen. IMPRESSION: Stable bilateral infiltrates Tubes and lines in satisfactory position. Electronically Signed   By: Inez Catalina M.D.   On: 11/04/2018 07:01   Dg Chest Port 1 View  Result Date: 10/31/2018 CLINICAL DATA:  Respiratory difficulty EXAM: PORTABLE CHEST 1 VIEW COMPARISON:  10/28/2018 FINDINGS: Endotracheal and NG tubes stable. Right upper extremity PICC stable. Normal heart size. Bilateral diffuse airspace disease left greater than right is stable. No pneumothorax. IMPRESSION: Stable bilateral airspace disease left greater than right. Electronically Signed   By: Marybelle Killings M.D.   On: 10/31/2018 07:22   Dg Chest Port 1 View  Result Date: 10/28/2018 CLINICAL DATA:  Follow-up PICC line placement EXAM: PORTABLE CHEST 1 VIEW COMPARISON:  Film from  earlier in the same day. FINDINGS: Endotracheal tube and gastric catheter are again noted in satisfactory position. Right-sided PICC line is noted with the catheter tip in the mid right atrium stable from prior study. Cardiac shadows within normal limits. Patchy bibasilar infiltrates are seen slightly more prominent than that noted on the prior exam. Mild central vascular congestion is seen. IMPRESSION: Mild edema and bibasilar infiltrates. Tubes and lines as described above. Electronically Signed   By: Inez Catalina M.D.   On: 10/28/2018 19:07   Dg Chest Port 1 View  Result Date: 10/28/2018 CLINICAL DATA:  53 year old female with a history of hypoxia EXAM: PORTABLE CHEST 1 VIEW COMPARISON:  Multiple prior most recent 10/27/2018 FINDINGS: Cardiomediastinal silhouette unchanged in size and contour. Low lung volumes with endotracheal tube terminating 2.1 cm above the carina. Repositioning of the gastric tube now terminating in the stomach near the pylorus. Patchy interstitial and airspace opacities bilateral lungs, worst at the left lung base with obscuration of left hemidiaphragm in the left heart border. No pneumothorax. Reticular pattern of opacities bilateral lungs. IMPRESSION: Similar  appearance of the chest x-ray with mixed interstitial and airspace opacities, worst at the left lung base. Unchanged endotracheal tube. Gastric tube has been repositioned, now terminating in the stomach, near the pylorus. Electronically Signed   By: Corrie Mckusick D.O.   On: 10/28/2018 11:53   Dg Abd Portable 1v  Result Date: 11/21/2018 CLINICAL DATA:  NG tube placement. EXAM: PORTABLE ABDOMEN - 1 VIEW COMPARISON:  Plain films of the abdomen dated 11/20/2018 and 11/19/2018. FINDINGS: Weighted tip feeding tube now appears appropriately positioned with tip in the proximal duodenum. Visualized bowel gas pattern is nonobstructive. IMPRESSION: Weighted tip feeding tube now appears appropriately positioned with tip in the proximal  duodenum. Electronically Signed   By: Franki Cabot M.D.   On: 11/21/2018 12:24   Dg Abd Portable 1v  Result Date: 11/20/2018 CLINICAL DATA:  Encounter for NG tube placement EXAM: PORTABLE ABDOMEN - 1 VIEW COMPARISON:  11/19/2018 FINDINGS: Feeding tube coils in the stomach with the tip in the fundus. Nonobstructive bowel gas pattern. No organomegaly or free air. IMPRESSION: Feeding tube coils in the stomach with the tip in the fundus. Electronically Signed   By: Rolm Baptise M.D.   On: 11/20/2018 19:57   Dg Abd Portable 1v  Result Date: 11/19/2018 CLINICAL DATA:  Vomiting. EXAM: PORTABLE ABDOMEN - 1 VIEW COMPARISON:  None. FINDINGS: A feeding tube coils in the stomach with the distal tip in the fundus. No evidence of bowel obstruction. IMPRESSION: A feeding tube coils in the stomach. No evidence of bowel obstruction. No cause for vomiting noted. Electronically Signed   By: Dorise Bullion III M.D   On: 11/19/2018 16:43   [place] Ekg Site Rite  Result Date: 11/09/2018 If Site Rite image not attached, placement could not be confirmed due to current cardiac rhythm.

## 2018-11-26 NOTE — Plan of Care (Signed)

## 2018-11-27 LAB — GLUCOSE, CAPILLARY
Glucose-Capillary: 100 mg/dL — ABNORMAL HIGH (ref 70–99)
Glucose-Capillary: 101 mg/dL — ABNORMAL HIGH (ref 70–99)
Glucose-Capillary: 106 mg/dL — ABNORMAL HIGH (ref 70–99)
Glucose-Capillary: 111 mg/dL — ABNORMAL HIGH (ref 70–99)
Glucose-Capillary: 113 mg/dL — ABNORMAL HIGH (ref 70–99)
Glucose-Capillary: 117 mg/dL — ABNORMAL HIGH (ref 70–99)
Glucose-Capillary: 122 mg/dL — ABNORMAL HIGH (ref 70–99)

## 2018-11-27 LAB — COMPREHENSIVE METABOLIC PANEL
ALT: 34 U/L (ref 0–44)
AST: 27 U/L (ref 15–41)
Albumin: 2.7 g/dL — ABNORMAL LOW (ref 3.5–5.0)
Alkaline Phosphatase: 122 U/L (ref 38–126)
Anion gap: 7 (ref 5–15)
BUN: 25 mg/dL — ABNORMAL HIGH (ref 6–20)
CO2: 29 mmol/L (ref 22–32)
Calcium: 8.8 mg/dL — ABNORMAL LOW (ref 8.9–10.3)
Chloride: 105 mmol/L (ref 98–111)
Creatinine, Ser: 0.48 mg/dL (ref 0.44–1.00)
GFR calc Af Amer: >60 mL/min (ref >60)
GFR calc non Af Amer: >60 mL/min (ref >60)
Glucose, Bld: 117 mg/dL — ABNORMAL HIGH (ref 70–99)
Potassium: 3.8 mmol/L (ref 3.5–5.1)
Sodium: 141 mmol/L (ref 135–145)
Total Bilirubin: 0.3 mg/dL (ref 0.3–1.2)
Total Protein: 6.1 g/dL — ABNORMAL LOW (ref 6.5–8.1)

## 2018-11-27 LAB — MAGNESIUM: Magnesium: 2.3 mg/dL (ref 1.7–2.4)

## 2018-11-27 LAB — CBC WITH DIFFERENTIAL/PLATELET
Abs Immature Granulocytes: 0.1 10*3/uL — ABNORMAL HIGH (ref 0.00–0.07)
Basophils Absolute: 0.1 10*3/uL (ref 0.0–0.1)
Basophils Relative: 0 %
Eosinophils Absolute: 0.9 10*3/uL — ABNORMAL HIGH (ref 0.0–0.5)
Eosinophils Relative: 7 %
HCT: 35.4 % — ABNORMAL LOW (ref 36.0–46.0)
Hemoglobin: 10.3 g/dL — ABNORMAL LOW (ref 12.0–15.0)
Immature Granulocytes: 1 %
Lymphocytes Relative: 24 %
Lymphs Abs: 3 10*3/uL (ref 0.7–4.0)
MCH: 28.6 pg (ref 26.0–34.0)
MCHC: 29.1 g/dL — ABNORMAL LOW (ref 30.0–36.0)
MCV: 98.3 fL (ref 80.0–100.0)
Monocytes Absolute: 1.3 10*3/uL — ABNORMAL HIGH (ref 0.1–1.0)
Monocytes Relative: 10 %
Neutro Abs: 7.1 10*3/uL (ref 1.7–7.7)
Neutrophils Relative %: 58 %
Platelets: 315 10*3/uL (ref 150–400)
RBC: 3.6 MIL/uL — ABNORMAL LOW (ref 3.87–5.11)
RDW: 18.2 % — ABNORMAL HIGH (ref 11.5–15.5)
WBC: 12.5 10*3/uL — ABNORMAL HIGH (ref 4.0–10.5)
nRBC: 0 % (ref 0.0–0.2)

## 2018-11-27 LAB — BRAIN NATRIURETIC PEPTIDE: B Natriuretic Peptide: 104.1 pg/mL — ABNORMAL HIGH (ref 0.0–100.0)

## 2018-11-27 LAB — D-DIMER, QUANTITATIVE: D-Dimer, Quant: 0.68 ug/mL-FEU — ABNORMAL HIGH (ref 0.00–0.50)

## 2018-11-27 LAB — C-REACTIVE PROTEIN: CRP: 1.5 mg/dL — ABNORMAL HIGH (ref <1.0)

## 2018-11-27 MED ORDER — ASPIRIN 81 MG PO CHEW
81.0000 mg | CHEWABLE_TABLET | Freq: Every day | ORAL | Status: DC
  Administered 2018-11-27 – 2018-11-29 (×3): 81 mg
  Filled 2018-11-27 (×3): qty 1

## 2018-11-27 MED ORDER — ORAL CARE MOUTH RINSE
15.0000 mL | Freq: Four times a day (QID) | OROMUCOSAL | Status: DC
  Administered 2018-11-28 – 2018-12-09 (×29): 15 mL via OROMUCOSAL

## 2018-11-27 NOTE — Progress Notes (Signed)
Daughter [name] called to give an update. No answer. Voicemail left with callback number. Will continue to monitor patient.

## 2018-11-27 NOTE — Progress Notes (Signed)
Family called , updated, all questions are answered by [facility] TIME
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
## 2018-11-27 NOTE — Progress Notes (Signed)
Physical Therapy Treatment Patient Details Name: Shannon BradfordDeborah Meyer MRN: [id number] DOB: 03/13/1966 Today's Date: 11/27/2018    History of Present Illness Pt adm 5/20 with hypoxic resp failure due to Covid 19. Pt intubated 5/20 and extubated 6/8, with re-intubation 6/8; Trach 6/11. PMH - none. 6/17 foul smelling trach site/secretions, elevated WBC and temp; vomited tube feeds.    PT Comments    The patient is more somnolent today but does participate in  Mobility. Stood x 2 at [employer], puling up with assist to power up. Patient's SaO2 dropped with reg [place] so placed on 7 L HFNC with sats >88%, RR up into 40's with activity, HR 90's to 110. Patient is making steady gains in functional mobility.Continue PT.  Follow Up Recommendations  CIR     Equipment Recommendations  Other (comment)    Recommendations for Other Services Rehab consult     Precautions / Restrictions Precautions Precautions: Fall Precaution Comments: HFNC 7 L and PMV; cortrack    Mobility  Bed Mobility Overal bed mobility: Needs Assistance Bed Mobility: Sidelying to Sit Rolling: Min assist Sidelying to sit: Mod assist;Max assist Supine to sit: Mod assist;+2 for physical assistance     General bed mobility comments: Initiates coming to EOB; pt looks as if she needs cues for rediirection,  Transfers Overall transfer level: Needs assistance   Transfers: Sit to/from Stand Sit to Stand: Max assist;+2 physical assistance         General transfer comment: Pt initiating anterior weight shift and able to take weight through legs, stood in stedy x 2 with boosting by 2 persons using bed pad to stand from raised bed and stedy seat, patient tends to lean forward.  Ambulation/Gait                 Stairs             Wheelchair Mobility    Modified Rankin (Stroke Patients Only)       Balance Overall balance assessment: Needs assistance Sitting-balance support: Feet unsupported;Bilateral upper extremity  supported Sitting balance-[name] Scale: Poor Sitting balance - Comments: posterior lean at times; feel pt is trying to lay back down due to anxiety with breathing     Standing balance-[name] Scale: Poor                              Cognition Arousal/Alertness: Awake/alert Behavior During Therapy: WFL for tasks assessed/performed Overall Cognitive Status: Impaired/Different from baseline Area of Impairment: Orientation;Attention;Memory;Following commands;Safety/judgement;Awareness;Problem solving                 Orientation Level: Disoriented to;Time Current Attention Level: Sustained Memory: Decreased short-term memory Following Commands: Follows one step commands consistently Safety/Judgement: Decreased awareness of safety;Decreased awareness of deficits Awareness: Intellectual Problem Solving: Slow processing;Difficulty sequencing;Requires verbal cues;Requires tactile cues General Comments: not as animated as prevoius PT session..      Exercises General Exercises - Upper Extremity Shoulder Flexion: AAROM;Both;10 reps;Supine Shoulder Extension: AAROM;Strengthening;Both;10 reps;Supine Shoulder ABduction: AAROM;Both;10 reps;Strengthening;Seated Elbow Flexion: AAROM;AROM;Strengthening;Both;10 reps;Supine Elbow Extension: AROM;AAROM;Both;10 reps;Seated Digit Composite Flexion: Both;10 reps;AROM    General Comments General comments (skin integrity, edema, etc.): Found cell phone in bag. Charged phone and pt engaged in seeing who had messaged her. Pt viewed pics her son had sent and took a selfie with assistance and sent to her son and daughter      Pertinent Vitals/Pain Pain Assessment: Faces Faces Pain Scale: Hurts little more Pain Location:  non specific Pain Descriptors / Indicators: Discomfort;Grimacing Pain Intervention(s): Monitored during session    Home Living                      Prior Function            PT Goals (current goals can now  be found in the care plan section) Acute Rehab PT Goals Patient Stated Goal: to get better and see her grand daughter Progress towards PT goals: Progressing toward goals    Frequency    Min 3X/week      PT Plan Current plan remains appropriate    Co-evaluation PT/OT/SLP Co-Evaluation/Treatment: Yes Reason for Co-Treatment: Complexity of the patient's impairments (multi-system involvement) PT goals addressed during session: Mobility/safety with mobility OT goals addressed during session: ADL's and self-care;Strengthening/ROM      AM-PAC PT "6 Clicks" Mobility   Outcome Measure  Help needed turning from your back to your side while in a flat bed without using bedrails?: A Lot Help needed moving from lying on your back to sitting on the side of a flat bed without using bedrails?: A Lot Help needed moving to and from a bed to a chair (including a wheelchair)?: Total Help needed standing up from a chair using your arms (e.g., wheelchair or bedside chair)?: Total Help needed to walk in hospital room?: Total Help needed climbing 3-5 steps with a railing? : Total 6 Click Score: 8    End of Session Equipment Utilized During Treatment: Oxygen Activity Tolerance: Patient tolerated treatment well Patient left: in chair;with nursing/sitter in room Nurse Communication: Mobility status;Need for lift equipment PT Visit Diagnosis: Other abnormalities of gait and mobility (R26.89);Muscle weakness (generalized) (M62.81)     Time: 0920-1000 PT Time Calculation (min) (ACUTE ONLY): 40 min  Charges:  $Therapeutic Activity: 23-37 mins                     Tresa Endo PT Acute Rehabilitation Services Pager [phone] Office [phone]    Claretha Cooper 11/27/2018, 1:34 PM

## 2018-11-27 NOTE — Progress Notes (Signed)
PROGRESS NOTE                                                                                                                                                                                                             Patient Demographics:    Shannon Meyer, is a 53 y.o. female, DOB - January 06, 1966, FSE:[id number]  Outpatient Primary MD for the patient is No primary care provider on file.    LOS - 70  Admit date - 10/22/2018    Chief Complaint  Patient presents with   Weakness       Brief Narrative  Shannon Meyer is a 53 y.o. female with no significant medical history who presented with cough, weakness and shortness of breath found to have covid-19 with bilateral CXR opacities, intubated in ED and admitted to [facility] 5/20. She has had a prolonged hospital course. She was unable to wean from ventilator and underwent tracheostomy. She began developing fevers and found to have pseudomonas in her sputum. She is currently on antibiotics. Still has significant leukocytosis that is being monitored. Weaning off oxygen. Plan is for CIR/SNF/LTAC once stable.   Subjective:   Patient in bed, appears comfortable, denies any headache, no fever, no chest pain or pressure, no shortness of breath , no abdominal pain. No focal weakness.    Assessment  & Plan :     1. Acute Hypoxic Resp. Failure due to Acute Covid 19 Viral Pneumonitis during the ongoing 2020 Covid 19 Pandemic - she has completed remdesivir 5/21 - 5/30, received actemra 5/20, and 5/21. Received CTX 5/20-5/26, azithromycin 5/20-5/24. Needed to be intubated subsequently trached after she failed extubation on 11/10/2018, currently on trach collar.  Requiring 4 to 5 L oxygen per minute.  Continue supportive care.     ABG     Component Value Date/Time   PHART 7.424 11/22/2018 2116   PCO2ART 41.5 11/22/2018 2116   PO2ART 65.0 (L) 11/22/2018 2116   HCO3 27.1 11/22/2018 2116   TCO2 28  11/22/2018 2116   ACIDBASEDEF 1.0 10/24/2018 0419   O2SAT 92.0 11/22/2018 2116    COVID-19 Labs  Recent Labs    11/27/18  11/27/2018 11/26/2018 11/24/2018  °Total Protein 6.5 - 8.1 g/dL 6.1(L) 6.8 7.1  °Albumin 3.5 - 5.0 g/dL 2.7(L) 3.0(L) 3.2(L)  °AST 15 - 41 U/L 27 33 46(H)  °ALT 0 - 44 U/L 34 40 50(H)  °Alk Phosphatase 38 - 126 U/L 122 155(H) 171(H)  °Total Bilirubin 0.3 - 1.2 mg/dL 0.3 0.2(L) 0.3  °  ° °   °Component Value Date/Time  ° BNP 104.1 (H) 11/27/2018 0608  ° °  ° °2.  Pseudomonas pneumonia -  Currently on Fortaz and clinically improving.  Continue for now.  Leukocytosis has improved and she is currently afebrile.  Will finish 7 days of treatment on 11/27/2018. ° °3.  ICU delirium, toxic encephalopathy.  Minimize sedatives and narcotics, Seroquel nightly, melatonin nightly to improve sleep pattern.  Expose to sunlight, up in chair, PT. ° °4.  Sinus tachycardia with questionable brief run of A. fib for a few minutes morning of 11/26/2018.  This would be a single isolated episode, no previous echo on file, she had stable TSH, currently on beta-blocker, unremarkable EKG, will place her on aspirin 81 mg, case discussed with cardiologist Dr. Skains who recommends no further change in treatment or plan, outpatient echocardiogram and cardiology follow-up.  Chad vas 2 score will be 3 . ° °5.  Hypokalemia.  Replaced. ° °6. 1 Out of 4 blood culture contamination.  Stable monitor. ° °7.  Obesity with a BMI of 34.  Outpatient follow-up with PCP. ° ° °  ° °Condition - Extremely Guarded ° °Family Communication  : None present ° °Code Status : Full code ° °Diet : Tube feeds ° °Diet Order   °       °  Diet NPO time specified  Diet effective midnight    °  °  °  °  °  ° °ICU ° °Consults  :  PCCM ° °Procedures  :  ° °5/20 Intubated in ED and admitted to [facility] ICU °5/21-5/26 Weaning FIO2 and PEEP, diuresing °5/26-6/3 Tolerating weaning for 1-2 hours °6/7 weaning on pressure support ventilation again this morning 10/5 °6/8 extubated, reintubated (tachypnea, tachycardia) °6/12 Trached °6/13 Trach collar all day  °6/14 bleeding from tracheostomy site °6/17 48 hours off vent, fever > pseudomonas pneumonia °6/20 slightly more awake, remains off vent ° °PUD Prophylaxis : Famotidine °  °DVT Prophylaxis  :    Heparin   ° °Lab Results  °Component Value Date  ° PLT 315 11/27/2018  ° ° °Inpatient Medications ° °Scheduled Meds: °• aspirin  81 mg Per Tube Daily  °• chlorhexidine  15 mL Mouth/Throat BID  °• Chlorhexidine Gluconate Cloth  6 each Topical Daily  °• famotidine  20 mg Per Tube Daily  °• furosemide  40 mg Per Tube Daily  °• heparin injection (subcutaneous)  7,500 Units Subcutaneous Q8H  °• insulin aspart  0-9 Units Subcutaneous Q4H  °• mouth rinse  15 mL Mouth Rinse 10 times per day  °• metoprolol tartrate  50 mg Per Tube BID  °• multivitamin  15 mL Per Tube Daily  °• oxyCODONE  5 mg Per Tube Q8H  ° Followed by  °• [START ON 11/28/2018] oxyCODONE  5 mg Per Tube Q12H  ° Followed by  °• [START ON 12/01/2018] oxyCODONE  5 mg Per Tube Daily  °• sodium chloride flush  10-40 mL Intracatheter Q12H  ° °Continuous Infusions: °• sodium chloride Stopped (11/26/18 1054)  °• cefTAZidime (FORTAZ)  IV Stopped (11/27/18 0549)  °•   Per Tube Daily   sodium chloride flush  10-40 mL Intracatheter Q12H   Continuous Infusions:  sodium chloride Stopped (11/26/18 1054)   cefTAZidime (FORTAZ)  IV Stopped (11/27/18 0549)   feeding supplement (VITAL AF 1.2 CAL) 1,000 mL (11/27/18 0242)   PRN Meds:.acetaminophen, hydrALAZINE, ipratropium-albuterol, metoprolol tartrate, ondansetron (ZOFRAN) IV, polyethylene glycol, QUEtiapine, sennosides  Antibiotics  :    Anti-infectives (From admission, onward)   Start     Dose/Rate Route Frequency Ordered Stop   11/21/18 2200  cefTAZidime (FORTAZ) 2 g in sodium chloride 0.9 % 100 mL IVPB     2 g 200 mL/hr over 30 Minutes Intravenous Every 8 hours 11/21/18 1426 11/28/18 0900   11/21/18 1400  cefTAZidime (FORTAZ) 2 g in sodium  chloride 0.9 % 100 mL IVPB  Status:  Discontinued     2 g 200 mL/hr over 30 Minutes Intravenous Every 8 hours 11/21/18 1330 11/21/18 1426   11/20/18 0400  vancomycin (VANCOCIN) IVPB 1000 mg/200 mL premix  Status:  Discontinued     1,000 mg 200 mL/hr over 60 Minutes Intravenous Every 12 hours 11/19/18 1517 11/20/18 1344   11/19/18 1530  vancomycin (VANCOCIN) 1,500 mg in sodium chloride 0.9 % 500 mL IVPB     1,500 mg 250 mL/hr over 120 Minutes Intravenous  Once 11/19/18 1517 11/19/18 1744   11/19/18 1530  piperacillin-tazobactam (ZOSYN) IVPB 3.375 g     3.375 g 12.5 mL/hr over 240 Minutes Intravenous Every 8 hours 11/19/18 1517 11/21/18 1734   11/01/18 1000  fluconazole (DIFLUCAN) IVPB 100 mg     100 mg 50 mL/hr over 60 Minutes Intravenous Every 24 hours 11/01/18 0857 11/03/18 1137   10/27/18 2200  cefTRIAXone (ROCEPHIN) 2 g in sodium chloride 0.9 % 100 mL IVPB     2 g 200 mL/hr over 30 Minutes Intravenous Every 24 hours 10/27/18 0835 10/28/18 2243   10/24/18 1400  remdesivir 100 mg in sodium chloride 0.9 % 250 mL IVPB  Status:  Discontinued     100 mg over 30 Minutes Intravenous Every 24 hours 10/23/18 1153 10/24/18 1335   10/24/18 1400  remdesivir 100 mg in sodium chloride 0.9 % 220 mL IVPB  Status:  Discontinued     100 mg over 30 Minutes Intravenous Every 24 hours 10/24/18 1334 10/24/18 1336   10/24/18 1400  remdesivir 100 mg in sodium chloride 0.9 % 230 mL IVPB     100 mg over 30 Minutes Intravenous Every 24 hours 10/24/18 1335 11/01/18 1500   10/24/18 1400  remdesivir 100 mg in sodium chloride 0.9 % 230 mL IVPB  Status:  Discontinued     100 mg over 30 Minutes Intravenous Every 24 hours 10/24/18 1337 10/24/18 1339   10/23/18 1400  remdesivir 200 mg in sodium chloride 0.9 % 250 mL IVPB     200 mg over 30 Minutes Intravenous Once 10/23/18 1153 10/23/18 1738   10/22/18 2200  azithromycin (ZITHROMAX) 500 mg in sodium chloride 0.9 % 250 mL IVPB     500 mg 250 mL/hr over 60 Minutes  Intravenous Every 24 hours 10/22/18 2148 10/26/18 2255   10/22/18 2200  cefTRIAXone (ROCEPHIN) 1 g in sodium chloride 0.9 % 100 mL IVPB  Status:  Discontinued     1 g 200 mL/hr over 30 Minutes Intravenous Every 24 hours 10/22/18 2148 10/27/18 0835       Time Spent in minutes  [place] M.D on 11/27/2018 at 11:05 AM  To page  11/27/18 0900 11/27/18 0905 11/27/18 0947 11/27/18 1000  °BP: 125/75 125/75 (!) 124/99   °Pulse: 100 100 91 88  °Resp: (!) 40  (!) 40 (!) 28  °Temp:      °TempSrc:      °SpO2: 97%  95% 100%  °Weight:      °Height:      ° ° °Wt Readings from Last 3 Encounters:  °11/27/18 86.7 kg  ° ° ° °Intake/Output Summary (Last 24 hours) at 11/27/2018 1105 °Last data filed at 11/27/2018 1016 °Gross per 24 hour  °Intake 1676.49 ml  °Output 1100 ml  °Net 576.49 ml  ° ° ° °Physical Exam ° °Awake will answer basic questions, more alert, trach collar in place, NG tube in place, pubic catheter along with right arm PICC line in place, slightly tachypneic. °[place].AT,PERRAL °Supple Neck,No JVD, No cervical lymphadenopathy appriciated.  °Symmetrical Chest wall movement, Good air movement bilaterally, CTAB °RRR,No Gallops, Rubs or new Murmurs, No Parasternal Heave °+ve B.Sounds, Abd Soft, No tenderness, No organomegaly appriciated, No rebound - guarding or rigidity. °No Cyanosis, Clubbing or edema, No new Rash or bruise ° ° ° ° Data Review:  ° ° °CBC °Recent Labs  °Lab 11/23/18 °0245 11/24/18 °0240 11/25/18 °0155 11/26/18 °0518 11/27/18 °0608  °WBC 21.3* 23.9* 25.0* 21.0* 12.5*  °HGB 12.7 12.7 12.7 13.0 10.3*  °HCT 41.4 41.4 40.9 42.3 35.4*  °PLT 422* 435* PLATELET CLUMPS NOTED ON SMEAR, UNABLE TO ESTIMATE 448* 315  °MCV 95.2 95.2 95.1 96.8 98.3  °MCH 29.2 29.2 29.5 29.7 28.6  °MCHC 30.7 30.7 31.1 30.7 29.1*  °RDW 17.7* 18.6* 18.9* 19.0*  18.2*  °LYMPHSABS  --   --  4.8*  --  3.0  °MONOABS  --   --  2.2*  --  1.3*  °EOSABS  --   --  2.1*  --  0.9*  °BASOSABS  --   --  0.1  --  0.1  ° ° °Chemistries  °Recent Labs  °Lab 11/22/18 °0115  11/23/18 °0245 11/24/18 °0240 11/25/18 °0155 11/26/18 °0518 11/27/18 °0608  °NA 139   < > 139 141 143 140 141  °K 3.8   < > 4.0 3.8 3.6 3.1* 3.8  °CL 100  --  101 102 104 103 105  °CO2 27  --  28 27 27 26 29  °GLUCOSE 111*  --  103* 98 115* 115* 117*  °BUN 13  --  12 26* 27* 23* 25*  °CREATININE 0.55  --  0.51 0.59 0.59 0.41* 0.48  °CALCIUM 8.8*  --  9.1 9.4 9.2 8.8* 8.8*  °MG  --   --   --  2.5*  --   --  2.3  °AST 48*  --  54* 46*  --  33 27  °ALT 53*  --  54* 50*  --  40 34  °ALKPHOS 182*  --  190* 171*  --  155* 122  °BILITOT 0.4  --  0.3 0.3  --  0.2* 0.3  ° < > = values in this interval not displayed.  ° °------------------------------------------------------------------------------------------------------------------ °No results for input(s): CHOL, HDL, LDLCALC, TRIG, CHOLHDL, LDLDIRECT in the last 72 hours. ° °No results found for: HGBA1C °------------------------------------------------------------------------------------------------------------------ °No results for input(s): TSH, T4TOTAL, T3FREE, THYROIDAB in the last 72 hours. ° °Invalid input(s): FREET3 ° °Lab Results  °Component Value Date  ° TSH 1.034 11/19/2018  ° ° °Cardiac Enzymes °No results for input(s): CKMB, TROPONINI, MYOGLOBIN in the last 168 hours. ° °Invalid   input(s): CK °------------------------------------------------------------------------------------------------------------------ °   °Component Value Date/Time  ° BNP 104.1 (H) 11/27/2018 0608  ° ° °Micro Results °Recent Results (from the past 240 hour(s))  °Culture, respiratory (non-expectorated)     Status: None  ° Collection Time: 11/19/18 10:59 AM  ° Specimen: Tracheal Aspirate; Respiratory  °Result Value Ref Range Status  ° Specimen Description   Final  °  TRACHEAL ASPIRATE °Performed  at [facility], [address] [place], [place] [address] °  ° Special Requests   Final  °  NONE °Performed at [facility], [address] [place], [place] [address] °  ° Gram Stain   Final  °  MODERATE WBC PRESENT,BOTH PMN AND MONONUCLEAR °FEW GRAM NEGATIVE RODS °FEW GRAM POSITIVE COCCI °RARE GRAM VARIABLE ROD °RARE YEAST °Performed at [facility], [address] , [place] [address] °  ° Culture FEW PSEUDOMONAS AERUGINOSA  Final  ° Report Status 11/21/2018 FINAL  Final  ° Organism ID, Bacteria PSEUDOMONAS AERUGINOSA  Final  °    Susceptibility  ° Pseudomonas aeruginosa - MIC*  °  CEFTAZIDIME 4 SENSITIVE Sensitive   °  CIPROFLOXACIN <=0.25 SENSITIVE Sensitive   °  GENTAMICIN <=1 SENSITIVE Sensitive   °  IMIPENEM <=0.25 SENSITIVE Sensitive   °  PIP/TAZO <=4 SENSITIVE Sensitive   °  CEFEPIME <=1 SENSITIVE Sensitive   °  * FEW PSEUDOMONAS AERUGINOSA  ° ° °Radiology Reports °Dg Abd 1 View ° °Result Date: 11/10/2018 °CLINICAL DATA:  Orogastric placement EXAM: ABDOMEN - 1 VIEW COMPARISON:  None. FINDINGS: Orogastric tube enters the stomach, loops in the body and has its tip in the antrum. Bowel gas pattern appears normal. IMPRESSION: Orogastric tip in the antrum. Electronically Signed   By: Mark  Shogry M.D.   On: 11/10/2018 16:50  ° °Ct Angio Chest Pe W Or Wo Contrast ° °Result Date: 10/28/2018 °CLINICAL DATA:  Increasing hypoxemia EXAM: CT ANGIOGRAPHY CHEST WITH CONTRAST TECHNIQUE: Multidetector CT imaging of the chest was performed using the standard protocol during bolus administration of intravenous contrast. Multiplanar CT image reconstructions and MIPs were obtained to evaluate the vascular anatomy. CONTRAST:  100mL OMNIPAQUE IOHEXOL 350 MG/ML SOLN COMPARISON:  None. FINDINGS: Cardiovascular: Examination of the pulmonary arteries is limited by breath motion artifact. Within this limitation, no evidence of embolism through the proximal segmental pulmonary  arterial level. Normal heart size. No pericardial effusion. Mediastinum/Nodes: No enlarged mediastinal, hilar, or axillary lymph nodes. Thyroid gland, trachea, and esophagus demonstrate no significant findings. Lungs/Pleura: Endotracheal tube is positioned with tip above the carina. Diffuse, extensive bilateral ground-glass pulmonary opacity and mild bibasilar consolidation and/or atelectasis. No pleural effusion or pneumothorax. Upper Abdomen: No acute abnormality. Esophagogastric tube with tip and side port below the diaphragm. Musculoskeletal: No chest wall abnormality. No acute or significant osseous findings. Review of the MIP images confirms the above findings. IMPRESSION: 1. Examination of the pulmonary arteries is limited by breath motion artifact. Within this limitation, no evidence of embolism through the proximal segmental pulmonary arterial level. 2. Diffuse, extensive bilateral ground-glass pulmonary opacity and mild bibasilar consolidation and/or atelectasis, consistent with known diagnosis of COVID-19 infection. 3. Endotracheal tube and esophagogastric tube in satisfactory position. Electronically Signed   By: Alex  Bibbey M.D.   On: 10/28/2018 12:38  ° °Dg Chest Port 1 View ° °Result Date: 11/25/2018 °CLINICAL DATA:  ARDS, COVID-19 positive EXAM: PORTABLE CHEST 1 VIEW COMPARISON:  Portable exam 0508 hours compared to 11/19/2018 FINDINGS: Tracheostomy tube unchanged.   Tip of RIGHT arm PICC line projects over cavoatrial junction. Feeding tube extends to at least the second portion of the duodenum. Stable heart size. Diffuse BILATERAL airspace infiltrates, unchanged. No pleural effusion or pneumothorax. IMPRESSION: Persistent diffuse BILATERAL airspace infiltrates Electronically Signed   By: Mark  Boles M.D.   On: 11/25/2018 08:08  ° °Dg Chest Port 1 View ° °Result Date: 11/19/2018 °CLINICAL DATA:  Fever EXAM: PORTABLE CHEST 1 VIEW COMPARISON:  11/13/2018, 11/11/2018 FINDINGS: Tracheostomy tube in  satisfactory position. Feeding tube coiled in the stomach. Right-sided PICC line with the tip projecting over the SVC. Worsening bilateral patchy interstitial and alveolar airspace disease throughout bilateral lungs concerning for multilobar pneumonia. No pleural effusion or pneumothorax. Stable cardiomediastinal silhouette. No aggressive osseous lesion. IMPRESSION: 1. Support lines and tubing in satisfactory position. 2. Worsening bilateral patchy interstitial and alveolar airspace disease throughout bilateral lungs concerning for multilobar pneumonia. Electronically Signed   By: Hetal  Patel   On: 11/19/2018 13:38  ° °Dg Chest Port 1 View ° °Result Date: 11/13/2018 °CLINICAL DATA:  Tracheostomy placement.  COVID-19. EXAM: PORTABLE CHEST 1 VIEW COMPARISON:  11/11/2018. FINDINGS: Tracheostomy is been inserted. Tip lies 4.5 cm above carina. BILATERAL pulmonary opacities persist, LEFT greater than RIGHT, not significantly improved. Cardiomegaly. Feeding tube duodenum. IMPRESSION: Satisfactory tracheostomy placement. No significant improvement aeration. Electronically Signed   By: John T Curnes M.D.   On: 11/13/2018 16:16  ° °Dg Chest Port 1 View ° °Result Date: 11/11/2018 °CLINICAL DATA:  Follow-up aspiration EXAM: PORTABLE CHEST 1 VIEW COMPARISON:  11/10/2018 FINDINGS: Cardiac shadow is stable. Endotracheal tube and gastric catheter are noted in satisfactory position. Right-sided PICC line is noted at the cavoatrial junction. Patchy infiltrates are again seen bilaterally stable from the prior exam. No pneumothorax is noted. IMPRESSION: No change from the previous day. Electronically Signed   By: Mark  Lukens M.D.   On: 11/11/2018 07:52  ° °Dg Chest Port 1 View ° °Result Date: 11/10/2018 °CLINICAL DATA:  Intubation.  OG tube placement. EXAM: PORTABLE CHEST 1 VIEW COMPARISON:  November 09, 2018 FINDINGS: The ETT remains in good position, 2 cm above the carina. Bilateral pulmonary infiltrates remain, similar on the left and more  prominent on the right in the interval. The OG tube terminates below today's film. No other changes. IMPRESSION: Support apparatus as above. Bilateral pulmonary infiltrates are stable on the left and mildly more prominent on the right in the interval. Electronically Signed   By: David  Williams III M.D   On: 11/10/2018 16:27  ° °Dg Chest Port 1 View ° °Result Date: 11/09/2018 °CLINICAL DATA:  53-year-old female positive COVID-19 status post PICC placement. EXAM: PORTABLE CHEST 1 VIEW COMPARISON:  Chest radiograph dated 11/04/2018 FINDINGS: A right-sided PICC is noted with tip over the right subclavian vein. Recommend further advancing by an additional approximately 10 cm. Endotracheal tube remains above the carina in similar position. Enteric tube extends below the diaphragm likely within the stomach. Interval progression of bilateral confluent densities since the prior radiograph. No large pleural effusion. There is no pneumothorax. No acute osseous pathology. Stable cardiac silhouette. IMPRESSION: 1. Right-sided PICC with tip over the right subclavian vein. Recommend further advancing an additional 10 cm. 2. Interval progression of bilateral confluent densities. Electronically Signed   By: Arash  Radparvar M.D.   On: 11/09/2018 03:25  ° °Dg Chest Port 1 View ° °Result Date: 11/04/2018 °CLINICAL DATA:  Acute respiratory failure EXAM: PORTABLE CHEST 1 VIEW COMPARISON:  10/31/2018 FINDINGS: Cardiac shadow is stable.   On: 11/09/2018 03:25   Dg Chest Port 1 View  Result Date: 11/04/2018 CLINICAL DATA:  Acute respiratory failure EXAM: PORTABLE CHEST 1 VIEW COMPARISON:  10/31/2018 FINDINGS: Cardiac shadow is stable. Endotracheal tube, right-sided PICC line and gastric catheter are noted in satisfactory position. Multifocal infiltrates are identified bilaterally left greater than right stable from the prior exam. No bony abnormality is seen. IMPRESSION: Stable bilateral infiltrates Tubes and lines in satisfactory position. Electronically Signed   By: Inez Catalina M.D.   On: 11/04/2018 07:01   Dg Chest Port 1 View  Result Date: 10/31/2018 CLINICAL DATA:  Respiratory difficulty EXAM: PORTABLE CHEST 1 VIEW COMPARISON:   10/28/2018 FINDINGS: Endotracheal and NG tubes stable. Right upper extremity PICC stable. Normal heart size. Bilateral diffuse airspace disease left greater than right is stable. No pneumothorax. IMPRESSION: Stable bilateral airspace disease left greater than right. Electronically Signed   By: Marybelle Killings M.D.   On: 10/31/2018 07:22   Dg Chest Port 1 View  Result Date: 10/28/2018 CLINICAL DATA:  Follow-up PICC line placement EXAM: PORTABLE CHEST 1 VIEW COMPARISON:  Film from earlier in the same day. FINDINGS: Endotracheal tube and gastric catheter are again noted in satisfactory position. Right-sided PICC line is noted with the catheter tip in the mid right atrium stable from prior study. Cardiac shadows within normal limits. Patchy bibasilar infiltrates are seen slightly more prominent than that noted on the prior exam. Mild central vascular congestion is seen. IMPRESSION: Mild edema and bibasilar infiltrates. Tubes and lines as described above. Electronically Signed   By: Inez Catalina M.D.   On: 10/28/2018 19:07   Dg Chest Port 1 View  Result Date: 10/28/2018 CLINICAL DATA:  53 year old female with a history of hypoxia EXAM: PORTABLE CHEST 1 VIEW COMPARISON:  Multiple prior most recent 10/27/2018 FINDINGS: Cardiomediastinal silhouette unchanged in size and contour. Low lung volumes with endotracheal tube terminating 2.1 cm above the carina. Repositioning of the gastric tube now terminating in the stomach near the pylorus. Patchy interstitial and airspace opacities bilateral lungs, worst at the left lung base with obscuration of left hemidiaphragm in the left heart border. No pneumothorax. Reticular pattern of opacities bilateral lungs. IMPRESSION: Similar appearance of the chest x-ray with mixed interstitial and airspace opacities, worst at the left lung base. Unchanged endotracheal tube. Gastric tube has been repositioned, now terminating in the stomach, near the pylorus. Electronically Signed   By:  Corrie Mckusick D.O.   On: 10/28/2018 11:53   Dg Abd Portable 1v  Result Date: 11/21/2018 CLINICAL DATA:  NG tube placement. EXAM: PORTABLE ABDOMEN - 1 VIEW COMPARISON:  Plain films of the abdomen dated 11/20/2018 and 11/19/2018. FINDINGS: Weighted tip feeding tube now appears appropriately positioned with tip in the proximal duodenum. Visualized bowel gas pattern is nonobstructive. IMPRESSION: Weighted tip feeding tube now appears appropriately positioned with tip in the proximal duodenum. Electronically Signed   By: Franki Cabot M.D.   On: 11/21/2018 12:24   Dg Abd Portable 1v  Result Date: 11/20/2018 CLINICAL DATA:  Encounter for NG tube placement EXAM: PORTABLE ABDOMEN - 1 VIEW COMPARISON:  11/19/2018 FINDINGS: Feeding tube coils in the stomach with the tip in the fundus. Nonobstructive bowel gas pattern. No organomegaly or free air. IMPRESSION: Feeding tube coils in the stomach with the tip in the fundus. Electronically Signed   By: Rolm Baptise M.D.   On: 11/20/2018 19:57   Dg Abd Portable 1v  Result Date: 11/19/2018 CLINICAL DATA:  Vomiting. EXAM: PORTABLE ABDOMEN -  stomach. No evidence of bowel obstruction. No cause for vomiting noted. Electronically Signed   By: David  Williams III M.D   On: 11/19/2018 16:43  ° °Us Ekg Site Rite ° °Result Date: 11/09/2018 °If Site Rite image not attached, placement could not be confirmed due to current cardiac rhythm. ° ° ° °

## 2018-11-27 NOTE — Progress Notes (Signed)
NAME:  Shannon Meyer, MRN:  [id number], DOB:  [birth year], LOS: 93 ADMISSION DATE:  10/22/2018, CONSULTATION DATE:  10/22/2018 REFERRING MD:  Sedonia Small, CHIEF COMPLAINT:  Dyspnea   Brief History   53 y/o female with no PMH admitted from [facility] on Oct 22, 2018 due to ARDS due to COVID 19 pneumonia.  Past Medical History  None  Significant Hospital Events   5/20 Intubated in ED and admitted to [facility] ICU 5/21-5/26 Weaning FIO2 and PEEP, diuresing 5/26-6/3 Tolerating weaning for 1-2 hours 6/7 weaning on pressure support ventilation again this morning 10/5 6/8 extubated, reintubated (tachypnea, tachycardia) 6/12 Trached 6/13 Trach collar all day  6/14 bleeding from tracheostomy site 6/17 48 hours off vent, fever > pseudomonas pneumonia 6/20 slightly more awake, remains off vent  Consults:  Pulmonary and critical care medicine  Procedures:  5/20 ET tube>6/11 Trach 6/11 >> 6/7 PICC line right arm>  Significant Diagnostic Tests:  Oct 28, 2018 CT angiogram chest no pulmonary embolism, bilateral atypical pneumonia consistent with COVID-19 CXR 6/23 - improving infiltrates Micro Data:  SARS-CoV-2 May 20+ Blood culture May 20 1 out of 2 GPC staph, coag negative resp 6/17 > pseudomonas aeruginosa  Antimicrobials:  Zithromax 5/20 >>5/24 Rocephin 5/20 >>5/26 Remdesivir 5/21 >5/30 Actemra 5/20>5/21 Solumedrol 5/12  > 5/26  Vanc 6/17 > x1 Zosyn 6/17 > 6/19 Ceftaz 6/17 >   Interim history/subjective:  Tachypnea appears mildly improved with reduction in carbohydrate load. More awake today.   Objective   Blood pressure 125/75, pulse 100, temperature 98 F (36.7 C), resp. rate (!) 42, height 5\' 4"  (1.626 m), weight 86.7 kg, SpO2 97 %.    FiO2 (%):  [98 %] 98 %   Intake/Output Summary (Last 24 hours) at 11/27/2018 0948 Last data filed at 11/27/2018 0800 Gross per 24 hour  Intake 1929.68 ml  Output 1100 ml  Net 829.68 ml   Filed Weights   11/25/18 0600 11/26/18  0500 11/27/18 0500  Weight: 84.5 kg 84.6 kg 86.7 kg   Examination:  General:  Well appearing, NAD HENT: Tracheostomy site intact.  PULM: Bilateral fine crackles with broncho-vesicular breathing on left. Mild increase in accessory muscle use. CV: Normal HS, extremities warm. GI: Soft, NT, ND and +BS MSK: normal bulk and tone. Trace edema. Neuro: Awake and following commands. Generalized weakness with poor truncal control.  Resolved Hospital Problem list     Assessment & Plan:   Resolving ARDS due to COVID-19 pneumonia Significant tachypnea limits progress and is likely related to still poor lung compliance. Normal ABG suggests tachypnea is in fact compensatory Overfeeding also possible. -Keep off ventilator (did not affect tachypnea in past) -Trial of permissive under feeding to see if improves respiratory rate. -Maintain even fluid balance. Additional iv furosemide today.  Delirium/ICU encephalopathy: improving Continue to wean oxycodone Frequent re-orientation Progressive mobilization. May benefit from melatonin to promote restorative sleep.  HCAP Pseudomonas pneumonia Complete 7 days of ceftazidime. Follow leukocytosis.  Unexplained leukocytosis Neutrophil predominance with multiple cell lines involved. Likely reactive. Observe for now as patient continues to improve clinically.  Severe physical deconditioning: Physical therapy/Occupational Therapy consult  Best practice:  Diet: Tube feeding as above. Pain/Anxiety/Delirium protocol (if indicated): RASS goal 0 VAP protocol (if indicated): Yes DVT prophylaxis: SCDs GI prophylaxis: Famotidine Glucose control: SSI Mobility: PT consult Code Status: Full Family Communication: will contact today Disposition: remain in ICU  Labs   CBC: Recent Labs  Lab 11/23/18 0245 11/24/18 0240 11/25/18 0155 11/26/18 0518 11/27/18 7341  WBC 21.3* 23.9* 25.0* 21.0* 12.5*  NEUTROABS  --   --  15.4*  --  7.1  HGB 12.7 12.7  12.7 13.0 10.3*  HCT 41.4 41.4 40.9 42.3 35.4*  MCV 95.2 95.2 95.1 96.8 98.3  PLT 422* 435* PLATELET CLUMPS NOTED ON SMEAR, UNABLE TO ESTIMATE 448* 315    Basic Metabolic Panel: Recent Labs  Lab 11/23/18 0245 11/24/18 0240 11/25/18 0155 11/26/18 0518 11/27/18 0608  NA 139 141 143 140 141  K 4.0 3.8 3.6 3.1* 3.8  CL 101 102 104 103 105  CO2 28 27 27 26 29   GLUCOSE 103* 98 115* 115* 117*  BUN 12 26* 27* 23* 25*  CREATININE 0.51 0.59 0.59 0.41* 0.48  CALCIUM 9.1 9.4 9.2 8.8* 8.8*  MG  --  2.5*  --   --  2.3  PHOS  --  4.3  --   --   --    GFR: Estimated Creatinine Clearance: 84.7 mL/min (by C-G formula based on SCr of 0.48 mg/dL). Recent Labs  Lab 11/24/18 0240 11/25/18 0155 11/26/18 0518 11/27/18 0608  WBC 23.9* 25.0* 21.0* 12.5*    Liver Function Tests: Recent Labs  Lab 11/22/18 0115 11/23/18 0245 11/24/18 0240 11/26/18 0518 11/27/18 0608  AST 48* 54* 46* 33 27  ALT 53* 54* 50* 40 34  ALKPHOS 182* 190* 171* 155* 122  BILITOT 0.4 0.3 0.3 0.2* 0.3  PROT 6.3* 7.1 7.1 6.8 6.1*  ALBUMIN 3.0* 3.1* 3.2* 3.0* 2.7*   No results for input(s): LIPASE, AMYLASE in the last 168 hours. No results for input(s): AMMONIA in the last 168 hours.  ABG    Component Value Date/Time   PHART 7.424 11/22/2018 2116   PCO2ART 41.5 11/22/2018 2116   PO2ART 65.0 (L) 11/22/2018 2116   HCO3 27.1 11/22/2018 2116   TCO2 28 11/22/2018 2116   ACIDBASEDEF 1.0 10/24/2018 0419   O2SAT 92.0 11/22/2018 2116     Coagulation Profile: No results for input(s): INR, PROTIME in the last 168 hours.  Cardiac Enzymes: No results for input(s): CKTOTAL, CKMB, CKMBINDEX, TROPONINI in the last 168 hours.  HbA1C: No results found for: HGBA1C  CBG: Recent Labs  Lab 11/26/18 1217 11/26/18 1745 11/26/18 1949 11/27/18 0040 11/27/18 0405  GLUCAP 96 105* 111* 101* 122*   Lynnell Catalanavi Jewelle Whitner, MD [facility] ICU Physician [facility]  Pager: [phone] Mobile: [phone] After hours:  [phone].  11/27/2018, 9:48 AM

## 2018-11-27 NOTE — Progress Notes (Signed)
Nutrition Follow-up RD working remotely.  DOCUMENTATION CODES:   Obesity unspecified  INTERVENTION:   Continue:   Vital AF 1.2 at 60 ml/hr via post pyloric Cortrak tube  Provides 1728 kcal, 108 gm protein, 1168 ml free water daily   NUTRITION DIAGNOSIS:   Inadequate oral intake related to inability to eat as evidenced by NPO status.  Ongoing  GOAL:   Provide needs based on ASPEN/SCCM guidelines  Met with TF  MONITOR:   Vent status, TF tolerance, Labs, Skin  ASSESSMENT:   53 yo female with no significant PMH who was admitted with COVID-19.  Currently on trach collar.  PT recommends CIR. SLP following for PMV use.  Labs reviewed. CBG's: 101-122  Medications reviewed and include Lasix, Novolog, MVI, Colace/Miralax prn.  Post-pyloric Cortrak tube in place. Pt with episodes of emesis which have now resolved.  6/18 TF restarted @ 20 6/25 TF increased to 60  Diet Order:   Diet Order            Diet NPO time specified  Diet effective midnight              EDUCATION NEEDS:   No education needs have been identified at this time  Skin:  Skin Assessment: Skin Integrity Issues: Skin Integrity Issues:: Stage II Stage II: perineum  Last BM:  6/22  Height:   Ht Readings from Last 1 Encounters:  10/22/18 '5\' 4"'  (1.626 m)    Weight:   Wt Readings from Last 1 Encounters:  11/27/18 86.7 kg    Ideal Body Weight:  54.5 kg  BMI:  Body mass index is 32.81 kg/m.  Estimated Nutritional Needs:   Kcal:  1600-1800  Protein:  109 gm  Fluid:  >/= 1.8 L   Maylon Peppers RD, LDN, CNSC [phone] Pager [phone] After Hours Pager

## 2018-11-27 NOTE — Progress Notes (Addendum)
Occupational Therapy Treatment Patient Details Name: Shannon Meyer MRN: [id number] DOB: [birth year] Today's Date: 11/27/2018    History of present illness Pt adm 5/20 with hypoxic resp failure due to Covid 19. Pt intubated 5/20 and extubated 6/8, with re-intubation 6/8; Trach 6/11. PMH - none. 6/17 foul smelling trach site/secretions, elevated WBC and temp; vomited tube feeds.   OT comments  Pt seen on 7L HFNC for mobility, then decreased back to 5L. PMSV on throughout session. Excellent session today. Pt able to progress to EOB with mod A +2 to fully get to EOB. Once EOB, pt experienced desat into high 60s with apparent respiratory discomfort. SpO2 increased to 7L and encouraged calm breathing. Pt did rebound into 90s and was able to progress with  Use of Stedy and Max A +2 to Stand x 2 ( unable to achieve full upright standing, but pushing with legs and able to extend trunk at times while seated on paddles) to transfer to chair. Once to chair, pt completed oral care with set up. Found pt's cell phone and pt engaged in looking at her messages, especially pics of her grand daughter. Pt took  a selfie and sent it to her son/daughter. "Thank you". Pt continues to make steady progress and will need extensive rehab. Recommend CIR. Pt asking for water.  Beginning of session: SpO2 97 5L; HR 91; RR 40; BP 125/57 End of session: SpO2 98-100 5L; HR 84; RR 33; BP 124/66  Follow Up Recommendations  CIR;Supervision/Assistance - 24 hour    Equipment Recommendations  3 in 1 bedside commode;Other (comment)    Recommendations for Other Services Rehab consult    Precautions / Restrictions Precautions Precautions: Fall Precaution Comments: HFNC 7 L and PMV; cortrack Restrictions Weight Bearing Restrictions: No       Mobility Bed Mobility Overal bed mobility: Needs Assistance Bed Mobility: Supine to Sit     Supine to sit: Mod assist;+2 for physical assistance     General bed mobility comments:  Initiates comingto EOB; pt looks as if she needs cues for rediirection, but feel she does not complete tasks due to SOB/exertion with movement  Transfers Overall transfer level: Needs assistance   Transfers: Sit to/from Stand Sit to Stand: Max assist;+2 physical assistance         General transfer comment: Pt initiating anterior weight shift and able to take weight through legs    Balance     Sitting balance-[name] Scale: Poor Sitting balance - Comments: posterior lean at times; feel pt is trying to lay back down due to aniety with breathing     Standing balance-[name] Scale: Poor                             ADL either performed or assessed with clinical judgement   ADL Overall ADL's : Needs assistance/impaired Eating/Feeding: NPO   Grooming: Moderate assistance;Wash/dry face;Oral care;Sitting   Upper Body Bathing: Maximal assistance;Sitting                           Functional mobility during ADLs: Maximal assistance;+2 for physical assistance(use of Stedy)       Vision   Additional Comments: wears glasses   Perception     Praxis      Cognition Arousal/Alertness: Awake/alert Behavior During Therapy: Flat affect Overall Cognitive Status: Impaired/Different from baseline Area of Impairment: Orientation;Attention;Memory;Following commands;Safety/judgement;Awareness;Problem solving  Orientation Level: Disoriented to;Time Current Attention Level: Sustained Memory: Decreased short-term memory Following Commands: Follows one step commands consistently Safety/Judgement: Decreased awareness of safety;Decreased awareness of deficits Awareness: Intellectual Problem Solving: Slow processing;Difficulty sequencing;Requires verbal cues;Requires tactile cues General Comments: More verbal; requires vc to redirect to task although pt limited due to SOB and works to control her RR        Exercises General Exercises - Upper  Extremity Shoulder Flexion: AAROM;Both;10 reps;Supine Shoulder Extension: AAROM;Strengthening;Both;10 reps;Supine Shoulder ABduction: AAROM;Both;10 reps;Strengthening;Seated Elbow Flexion: AAROM;AROM;Strengthening;Both;10 reps;Supine Elbow Extension: AROM;AAROM;Both;10 reps;Seated Digit Composite Flexion: Both;10 reps;AROM   Shoulder Instructions       General Comments Found cell phone in bag. Charged phone and pt engaged in seeing who had messaged her. Pt viewed pics her son had sent and took a selfie with assistance and sent to her son and daughter    Pertinent Vitals/ Pain       Pain Assessment: Faces Faces Pain Scale: Hurts little more Pain Location: (disomfort with breathing) Pain Descriptors / Indicators: Discomfort;Grimacing Pain Intervention(s): Limited activity within patient's tolerance  Home Living                                          Prior Functioning/Environment              Frequency  Min 3X/week        Progress Toward Goals  OT Goals(current goals can now be found in the care plan section)  Progress towards OT goals: Progressing toward goals  Acute Rehab OT Goals Patient Stated Goal: to get better and see her grand daughter OT Goal Formulation: With patient/family Time For Goal Achievement: 12/01/18 Potential to Achieve Goals: Good ADL Goals Pt Will Perform Grooming: with min assist;sitting Pt Will Transfer to Toilet: stand pivot transfer;bedside commode;with mod assist Pt Will Perform Toileting - Clothing Manipulation and hygiene: with mod assist;sit to/from stand;sitting/lateral leans Additional ADL Goal #1: Pt will perform bed mobility with Min A in preparation for ADLs Additional ADL Goal #2: Pt will tolerate sitting at EOB for 10 minutes with Min Guard A in preparation for ADLs Additional ADL Goal #3: Pt will demonstrate selective attention during ADL in a distracting environement with Min cues  Plan Discharge plan  remains appropriate    Co-evaluation    PT/OT/SLP Co-Evaluation/Treatment: Yes Reason for Co-Treatment: Complexity of the patient's impairments (multi-system involvement);For patient/therapist safety;To address functional/ADL transfers   OT goals addressed during session: ADL's and self-care;Strengthening/ROM      AM-PAC OT "6 Clicks" Daily Activity     Outcome Measure   Help from another person eating meals?: Total Help from another person taking care of personal grooming?: A Lot Help from another person toileting, which includes using toliet, bedpan, or urinal?: A Lot Help from another person bathing (including washing, rinsing, drying)?: A Lot Help from another person to put on and taking off regular upper body clothing?: Total Help from another person to put on and taking off regular lower body clothing?: Total 6 Click Score: 9    End of Session Equipment Utilized During Treatment: Gait belt;Oxygen(7L during mobility; left on 5L)  OT Visit Diagnosis: Unsteadiness on feet (R26.81);Other abnormalities of gait and mobility (R26.89);Muscle weakness (generalized) (M62.81);Other symptoms and signs involving cognitive function Pain - part of body: (discomfort with breathing)   Activity Tolerance Patient tolerated treatment well   Patient Left in chair;with  call bell/phone within reach;with family/visitor present   Nurse Communication Mobility status        Time: [id number] OT Time Calculation (min): 60 min  Charges: OT General Charges $OT Visit: 1 Visit OT Treatments $Self Care/Home Management : 8-22 mins $Therapeutic Exercise: 8-22 mins  Luisa DagoHilary Anselm Aumiller, OT/L   Acute OT Clinical Specialist Acute Rehabilitation Services Pager [phone] Office [phone]    [facility] HospitalWARD,HILLARY 11/27/2018, 11:06 AM

## 2018-11-28 LAB — COMPREHENSIVE METABOLIC PANEL
ALT: 30 U/L (ref 0–44)
AST: 26 U/L (ref 15–41)
Albumin: 2.6 g/dL — ABNORMAL LOW (ref 3.5–5.0)
Alkaline Phosphatase: 114 U/L (ref 38–126)
Anion gap: 9 (ref 5–15)
BUN: 28 mg/dL — ABNORMAL HIGH (ref 6–20)
CO2: 27 mmol/L (ref 22–32)
Calcium: 8.5 mg/dL — ABNORMAL LOW (ref 8.9–10.3)
Chloride: 106 mmol/L (ref 98–111)
Creatinine, Ser: 0.57 mg/dL (ref 0.44–1.00)
GFR calc Af Amer: >60 mL/min (ref >60)
GFR calc non Af Amer: >60 mL/min (ref >60)
Glucose, Bld: 95 mg/dL (ref 70–99)
Potassium: 3.7 mmol/L (ref 3.5–5.1)
Sodium: 142 mmol/L (ref 135–145)
Total Bilirubin: <0.1 mg/dL — ABNORMAL LOW (ref 0.3–1.2)
Total Protein: 5.8 g/dL — ABNORMAL LOW (ref 6.5–8.1)

## 2018-11-28 LAB — CBC WITH DIFFERENTIAL/PLATELET
Abs Immature Granulocytes: 0.14 10*3/uL — ABNORMAL HIGH (ref 0.00–0.07)
Basophils Absolute: 0.1 10*3/uL (ref 0.0–0.1)
Basophils Relative: 1 %
Eosinophils Absolute: 1.2 10*3/uL — ABNORMAL HIGH (ref 0.0–0.5)
Eosinophils Relative: 9 %
HCT: 37.5 % (ref 36.0–46.0)
Hemoglobin: 11.3 g/dL — ABNORMAL LOW (ref 12.0–15.0)
Immature Granulocytes: 1 %
Lymphocytes Relative: 28 %
Lymphs Abs: 4 10*3/uL (ref 0.7–4.0)
MCH: 29.6 pg (ref 26.0–34.0)
MCHC: 30.1 g/dL (ref 30.0–36.0)
MCV: 98.2 fL (ref 80.0–100.0)
Monocytes Absolute: 1.7 10*3/uL — ABNORMAL HIGH (ref 0.1–1.0)
Monocytes Relative: 12 %
Neutro Abs: 7.2 10*3/uL (ref 1.7–7.7)
Neutrophils Relative %: 49 %
Platelets: 344 10*3/uL (ref 150–400)
RBC: 3.82 MIL/uL — ABNORMAL LOW (ref 3.87–5.11)
RDW: 18.3 % — ABNORMAL HIGH (ref 11.5–15.5)
WBC: 14.3 10*3/uL — ABNORMAL HIGH (ref 4.0–10.5)
nRBC: 0.2 % (ref 0.0–0.2)

## 2018-11-28 LAB — GLUCOSE, CAPILLARY
Glucose-Capillary: 110 mg/dL — ABNORMAL HIGH (ref 70–99)
Glucose-Capillary: 108 mg/dL — ABNORMAL HIGH (ref 70–99)
Glucose-Capillary: 94 mg/dL (ref 70–99)
Glucose-Capillary: 89 mg/dL (ref 70–99)
Glucose-Capillary: 109 mg/dL — ABNORMAL HIGH (ref 70–99)
Glucose-Capillary: 125 mg/dL — ABNORMAL HIGH (ref 70–99)

## 2018-11-28 LAB — BRAIN NATRIURETIC PEPTIDE: B Natriuretic Peptide: 89.5 pg/mL (ref 0.0–100.0)

## 2018-11-28 LAB — MRSA PCR SCREENING: MRSA by PCR: NEGATIVE

## 2018-11-28 LAB — C-REACTIVE PROTEIN: CRP: 1.7 mg/dL — ABNORMAL HIGH (ref <1.0)

## 2018-11-28 LAB — MAGNESIUM: Magnesium: 2.2 mg/dL (ref 1.7–2.4)

## 2018-11-28 LAB — D-DIMER, QUANTITATIVE: D-Dimer, Quant: 0.68 ug/mL-FEU — ABNORMAL HIGH (ref 0.00–0.50)

## 2018-11-28 NOTE — Progress Notes (Signed)
PROGRESS NOTE                                                                                                                                                                                                             Patient Demographics:    Shannon Meyer, is a 53 y.o. female, DOB - 02-03-1966, RJJ:[id number]  Outpatient Primary MD for the patient is No primary care provider on file.    LOS - 25  Admit date - 10/22/2018    Chief Complaint  Patient presents with  . Weakness       Brief Narrative  Shannon Meyer is a 53 y.o. female with no significant medical history who presented with cough, weakness and shortness of breath found to have covid-19 with bilateral CXR opacities, intubated in ED and admitted to [facility] 5/20. She has had a prolonged hospital course. She was unable to wean from ventilator and underwent tracheostomy. She began developing fevers and found to have pseudomonas in her sputum. She is currently on antibiotics. Still has significant leukocytosis that is being monitored. Weaning off oxygen. Plan is for CIR/SNF/LTAC once stable.   Subjective:   Patient in bed, appears comfortable, denies any headache, no fever, no chest pain or pressure, no shortness of breath , no abdominal pain. No focal weakness.   Assessment  & Plan :     1. Acute Hypoxic Resp. Failure due to Acute Covid 19 Viral Pneumonitis during the ongoing 2020 Covid 19 Pandemic - she has completed remdesivir 5/21 - 5/30, received actemra 5/20, and 5/21. Received CTX 5/20-5/26, azithromycin 5/20-5/24. Needed to be intubated subsequently trached after she failed extubation on 11/10/2018, currently on trach collar.  Requiring 4 L oxygen per minute.  Continue supportive care. Will advance activity, PT, Med surg transfer.   ABG     Component Value Date/Time   PHART 7.424 11/22/2018 2116   PCO2ART 41.5 11/22/2018 2116   PO2ART 65.0 (L) 11/22/2018 2116   HCO3 27.1 11/22/2018 2116   TCO2 28 11/22/2018 2116   ACIDBASEDEF 1.0 10/24/2018 0419   O2SAT 92.0 11/22/2018 2116    COVID-19 Labs  Recent Labs    11/27/18 0608 11/28/18 0300  DDIMER 0.68* 0.68*  CRP 1.5* 1.7*    Lab Results  Component Value Date   SARSCOV2NAA POSITIVE (A) 10/22/2018     Hepatic Function  Latest Ref Rng & Units 11/28/2018 11/27/2018 11/26/2018  Total Protein 6.5 - 8.1 g/dL 5.8(L) 6.1(L) 6.8  Albumin 3.5 - 5.0 g/dL 2.6(L) 2.7(L) 3.0(L)  AST 15 - 41 U/L 26 27 33  ALT 0 - 44 U/L 30 34 40  Alk Phosphatase 38 - 126 U/L 114 122 155(H)  Total Bilirubin 0.3 - 1.2 mg/dL <0.1(L) 0.3 0.2(L)        Component Value Date/Time   BNP 89.5 11/28/2018 0300      2.  Pseudomonas pneumonia -  Currently on Fortaz and clinically improving.  Continue for now.  Leukocytosis has improved and she is currently afebrile.  Will finish 7 days of treatment on 11/27/2018.  3.  ICU delirium, toxic encephalopathy.  Minimize sedatives and narcotics, Seroquel nightly, melatonin nightly to improve sleep pattern.  Expose to sunlight, up in chair, PT. Much improved.  4.  Sinus tachycardia with questionable brief run of A. fib for a few minutes morning of 11/26/2018.  This would be a single isolated episode, no previous echo on file, she had stable TSH, currently on beta-blocker, unremarkable EKG, will place her on aspirin 81 mg, case discussed with cardiologist Dr. Marlou Porch who recommends no further change in treatment or plan, outpatient echocardiogram and cardiology follow-up.  Mali vas 2 score will be 3 .  5.  Hypokalemia.  Replaced and stable.  6. 1 Out of 4 blood culture contamination.  Stable monitor.  7.  Obesity with a BMI of 34.  Outpatient follow-up with PCP.  8.  Tongue laceration which was noted by nursing staff several days ago, likely while she was intubated.  Healing well.  Supportive care.  Outpatient ENT follow-up if needed.  Speech following.   9.  Tracheostomy induced  dysphagia requiring tube feeds, continue speech following.  Currently on PS MV valve, cleared for ice chips for now, continue to monitor.    Condition - Fair  Family Communication  : daughter [name] called 11/28/18 @ 9.30 am - voice message left on her cell  Code Status : Full code  Diet : Tube feeds  Diet Order            Diet NPO time specified Except for: Ice Chips  Diet effective midnight               Disposition - Med  Consults  :  PCCM  Procedures  :   5/20 Intubated in ED and admitted to [facility] ICU 5/21-5/26 Weaning FIO2 and PEEP, diuresing 5/26-6/3 Tolerating weaning for 1-2 hours 6/7 weaning on pressure support ventilation again this morning 10/5 6/8 extubated, reintubated (tachypnea, tachycardia) 6/12 Trached 6/13 Trach collar all day  6/14 bleeding from tracheostomy site 6/17 48 hours off vent, fever > pseudomonas pneumonia 6/20 slightly more awake, remains off vent  PUD Prophylaxis : Famotidine   DVT Prophylaxis  :    Heparin    Lab Results  Component Value Date   PLT 344 11/28/2018    Inpatient Medications  Scheduled Meds: . aspirin  81 mg Per Tube Daily  . chlorhexidine  15 mL Mouth/Throat BID  . Chlorhexidine Gluconate Cloth  6 each Topical Daily  . famotidine  20 mg Per Tube Daily  . furosemide  40 mg Per Tube Daily  . heparin injection (subcutaneous)  7,500 Units Subcutaneous Q8H  . insulin aspart  0-9 Units Subcutaneous Q4H  . mouth rinse  15 mL Mouth Rinse QID  . metoprolol tartrate  50 mg Per  Tube BID  . multivitamin  15 mL Per Tube Daily  . oxyCODONE  5 mg Per Tube Q12H   Followed by  . [START ON 12/01/2018] oxyCODONE  5 mg Per Tube Daily  . sodium chloride flush  10-40 mL Intracatheter Q12H   Continuous Infusions: . sodium chloride 10 mL/hr at 11/27/18 1700  . feeding supplement (VITAL AF 1.2 CAL) 1,000 mL (11/27/18 1830)   PRN Meds:.acetaminophen, hydrALAZINE, ipratropium-albuterol, metoprolol tartrate,  ondansetron (ZOFRAN) IV, polyethylene glycol, QUEtiapine, sennosides  Antibiotics  :    Anti-infectives (From admission, onward)   Start     Dose/Rate Route Frequency Ordered Stop   11/21/18 2200  cefTAZidime (FORTAZ) 2 g in sodium chloride 0.9 % 100 mL IVPB     2 g 200 mL/hr over 30 Minutes Intravenous Every 8 hours 11/21/18 1426 11/28/18 0656   11/21/18 1400  cefTAZidime (FORTAZ) 2 g in sodium chloride 0.9 % 100 mL IVPB  Status:  Discontinued     2 g 200 mL/hr over 30 Minutes Intravenous Every 8 hours 11/21/18 1330 11/21/18 1426   11/20/18 0400  vancomycin (VANCOCIN) IVPB 1000 mg/200 mL premix  Status:  Discontinued     1,000 mg 200 mL/hr over 60 Minutes Intravenous Every 12 hours 11/19/18 1517 11/20/18 1344   11/19/18 1530  vancomycin (VANCOCIN) 1,500 mg in sodium chloride 0.9 % 500 mL IVPB     1,500 mg 250 mL/hr over 120 Minutes Intravenous  Once 11/19/18 1517 11/19/18 1744   11/19/18 1530  piperacillin-tazobactam (ZOSYN) IVPB 3.375 g     3.375 g 12.5 mL/hr over 240 Minutes Intravenous Every 8 hours 11/19/18 1517 11/21/18 1734   11/01/18 1000  fluconazole (DIFLUCAN) IVPB 100 mg     100 mg 50 mL/hr over 60 Minutes Intravenous Every 24 hours 11/01/18 0857 11/03/18 1137   10/27/18 2200  cefTRIAXone (ROCEPHIN) 2 g in sodium chloride 0.9 % 100 mL IVPB     2 g 200 mL/hr over 30 Minutes Intravenous Every 24 hours 10/27/18 0835 10/28/18 2243   10/24/18 1400  remdesivir 100 mg in sodium chloride 0.9 % 250 mL IVPB  Status:  Discontinued     100 mg over 30 Minutes Intravenous Every 24 hours 10/23/18 1153 10/24/18 1335   10/24/18 1400  remdesivir 100 mg in sodium chloride 0.9 % 220 mL IVPB  Status:  Discontinued     100 mg over 30 Minutes Intravenous Every 24 hours 10/24/18 1334 10/24/18 1336   10/24/18 1400  remdesivir 100 mg in sodium chloride 0.9 % 230 mL IVPB     100 mg over 30 Minutes Intravenous Every 24 hours 10/24/18 1335 11/01/18 1500   10/24/18 1400  remdesivir 100 mg in sodium  chloride 0.9 % 230 mL IVPB  Status:  Discontinued     100 mg over 30 Minutes Intravenous Every 24 hours 10/24/18 1337 10/24/18 1339   10/23/18 1400  remdesivir 200 mg in sodium chloride 0.9 % 250 mL IVPB     200 mg over 30 Minutes Intravenous Once 10/23/18 1153 10/23/18 1738   10/22/18 2200  azithromycin (ZITHROMAX) 500 mg in sodium chloride 0.9 % 250 mL IVPB     500 mg 250 mL/hr over 60 Minutes Intravenous Every 24 hours 10/22/18 2148 10/26/18 2255   10/22/18 2200  cefTRIAXone (ROCEPHIN) 1 g in sodium chloride 0.9 % 100 mL IVPB  Status:  Discontinued     1 g 200 mL/hr over 30 Minutes Intravenous Every 24 hours 10/22/18 2148 10/27/18  1224       Time Spent in minutes  30   Lala Lund M.D on 11/28/2018 at 9:20 AM  To page go to [email or web address] - password [facility]  [facility] -  Office  [phone]     See all Orders from today for further details    Objective:   Vitals:   11/28/18 0600 11/28/18 0700 11/28/18 0800 11/28/18 0901  BP: 108/68 (!) 100/55 (!) 121/57 114/69  Pulse: 93 (!) 103 (!) 105 (!) 110  Resp: (!) 29 (!) 26 (!) 33   Temp:   99.6 F (37.6 C)   TempSrc:   Oral   SpO2: 98% 100% 100%   Weight:      Height:        Wt Readings from Last 3 Encounters:  11/28/18 87 kg     Intake/Output Summary (Last 24 hours) at 11/28/2018 0920 Last data filed at 11/28/2018 0907 Gross per 24 hour  Intake 1579.82 ml  Output 450 ml  Net 1129.82 ml     Physical Exam  Awake will answer basic questions, more alert, trach collar in place, NG tube in place, pubic catheter along with right arm PICC line in place, slightly tachypneic. [place].AT,PERRAL Supple Neck,No JVD, No cervical lymphadenopathy appriciated.  Symmetrical Chest wall movement, Good air movement bilaterally, CTAB RRR,No Gallops, Rubs or new Murmurs, No Parasternal Heave +ve B.Sounds, Abd Soft, No tenderness, No organomegaly appriciated, No rebound - guarding or rigidity. No Cyanosis, Clubbing or edema, No  new Rash or bruise    Data Review:    CBC Recent Labs  Lab 11/24/18 0240 11/25/18 0155 11/26/18 0518 11/27/18 0608 11/28/18 0300  WBC 23.9* 25.0* 21.0* 12.5* 14.3*  HGB 12.7 12.7 13.0 10.3* 11.3*  HCT 41.4 40.9 42.3 35.4* 37.5  PLT 435* PLATELET CLUMPS NOTED ON SMEAR, UNABLE TO ESTIMATE 448* 315 344  MCV 95.2 95.1 96.8 98.3 98.2  MCH 29.2 29.5 29.7 28.6 29.6  MCHC 30.7 31.1 30.7 29.1* 30.1  RDW 18.6* 18.9* 19.0* 18.2* 18.3*  LYMPHSABS  --  4.8*  --  3.0 4.0  MONOABS  --  2.2*  --  1.3* 1.7*  EOSABS  --  2.1*  --  0.9* 1.2*  BASOSABS  --  0.1  --  0.1 0.1    Chemistries  Recent Labs  Lab 11/23/18 0245 11/24/18 0240 11/25/18 0155 11/26/18 0518 11/27/18 0608 11/28/18 0300  NA 139 141 143 140 141 142  K 4.0 3.8 3.6 3.1* 3.8 3.7  CL 101 102 104 103 105 106  CO2 '28 27 27 26 29 27  ' GLUCOSE 103* 98 115* 115* 117* 95  BUN 12 26* 27* 23* 25* 28*  CREATININE 0.51 0.59 0.59 0.41* 0.48 0.57  CALCIUM 9.1 9.4 9.2 8.8* 8.8* 8.5*  MG  --  2.5*  --   --  2.3 2.2  AST 54* 46*  --  33 27 26  ALT 54* 50*  --  40 34 30  ALKPHOS 190* 171*  --  155* 122 114  BILITOT 0.3 0.3  --  0.2* 0.3 <0.1*   ------------------------------------------------------------------------------------------------------------------ No results for input(s): CHOL, HDL, LDLCALC, TRIG, CHOLHDL, LDLDIRECT in the last 72 hours.  No results found for: HGBA1C ------------------------------------------------------------------------------------------------------------------ No results for input(s): TSH, T4TOTAL, T3FREE, THYROIDAB in the last 72 hours.  Invalid input(s): FREET3  Lab Results  Component Value Date   TSH 1.034 11/19/2018    Cardiac Enzymes No results for input(s): CKMB, TROPONINI, MYOGLOBIN in the last  168 hours.  Invalid input(s): CK ------------------------------------------------------------------------------------------------------------------    Component Value Date/Time   BNP 89.5  11/28/2018 0300    Micro Results Recent Results (from the past 240 hour(s))  Culture, respiratory (non-expectorated)     Status: None   Collection Time: 11/19/18 10:59 AM   Specimen: Tracheal Aspirate; Respiratory  Result Value Ref Range Status   Specimen Description   Final    TRACHEAL ASPIRATE Performed at [facility] [address] [place], [place] [address]    Special Requests   Final    NONE Performed at [facility], [place] [address] [place], [place] [address]    Gram Stain   Final    MODERATE WBC PRESENT,BOTH PMN AND MONONUCLEAR FEW GRAM NEGATIVE RODS FEW GRAM POSITIVE COCCI RARE GRAM VARIABLE ROD RARE YEAST Performed at [facility], [place] [address], [place],  [address]    Culture FEW PSEUDOMONAS AERUGINOSA  Final   Report Status 11/21/2018 FINAL  Final   Organism ID, Bacteria PSEUDOMONAS AERUGINOSA  Final      Susceptibility   Pseudomonas aeruginosa - MIC*    CEFTAZIDIME 4 SENSITIVE Sensitive     CIPROFLOXACIN <=0.25 SENSITIVE Sensitive     GENTAMICIN <=1 SENSITIVE Sensitive     IMIPENEM <=0.25 SENSITIVE Sensitive     PIP/TAZO <=4 SENSITIVE Sensitive     CEFEPIME <=1 SENSITIVE Sensitive     * FEW PSEUDOMONAS AERUGINOSA    Radiology Reports Dg Abd 1 View  Result Date: 11/10/2018 CLINICAL DATA:  Orogastric placement EXAM: ABDOMEN - 1 VIEW COMPARISON:  None. FINDINGS: Orogastric tube enters the stomach, loops in the body and has its tip in the antrum. Bowel gas pattern appears normal. IMPRESSION: Orogastric tip in the antrum. Electronically Signed   By: Nelson Chimes M.D.   On: 11/10/2018 16:50   Dg Chest Port 1 View  Result Date: 11/25/2018 CLINICAL DATA:  ARDS, COVID-19 positive EXAM: PORTABLE CHEST 1 VIEW COMPARISON:  Portable exam 0508 hours compared to 11/19/2018 FINDINGS: Tracheostomy tube unchanged. Tip of RIGHT arm PICC line projects over cavoatrial junction. Feeding tube extends to at least the second  portion of the duodenum. Stable heart size. Diffuse BILATERAL airspace infiltrates, unchanged. No pleural effusion or pneumothorax. IMPRESSION: Persistent diffuse BILATERAL airspace infiltrates Electronically Signed   By: Lavonia Dana M.D.   On: 11/25/2018 08:08   Dg Chest Port 1 View  Result Date: 11/19/2018 CLINICAL DATA:  Fever EXAM: PORTABLE CHEST 1 VIEW COMPARISON:  11/13/2018, 11/11/2018 FINDINGS: Tracheostomy tube in satisfactory position. Feeding tube coiled in the stomach. Right-sided PICC line with the tip projecting over the SVC. Worsening bilateral patchy interstitial and alveolar airspace disease throughout bilateral lungs concerning for multilobar pneumonia. No pleural effusion or pneumothorax. Stable cardiomediastinal silhouette. No aggressive osseous lesion. IMPRESSION: 1. Support lines and tubing in satisfactory position. 2. Worsening bilateral patchy interstitial and alveolar airspace disease throughout bilateral lungs concerning for multilobar pneumonia. Electronically Signed   By: Kathreen Devoid   On: 11/19/2018 13:38   Dg Chest Port 1 View  Result Date: 11/13/2018 CLINICAL DATA:  Tracheostomy placement.  COVID-19. EXAM: PORTABLE CHEST 1 VIEW COMPARISON:  11/11/2018. FINDINGS: Tracheostomy is been inserted. Tip lies 4.5 cm above carina. BILATERAL pulmonary opacities persist, LEFT greater than RIGHT, not significantly improved. Cardiomegaly. Feeding tube duodenum. IMPRESSION: Satisfactory tracheostomy placement. No significant improvement aeration. Electronically Signed   By: Staci Righter M.D.   On: 11/13/2018 16:16   Dg Chest Port 1 [address]  Result Date: 11/11/2018 CLINICAL DATA:  Follow-up aspiration EXAM: PORTABLE CHEST 1 VIEW COMPARISON:  11/10/2018 FINDINGS: Cardiac shadow is stable. Endotracheal tube and gastric catheter are noted in satisfactory position. Right-sided PICC line is noted at the cavoatrial junction. Patchy infiltrates are again seen bilaterally stable from the prior  exam. No pneumothorax is noted. IMPRESSION: No change from the previous day. Electronically Signed   By: Inez Catalina M.D.   On: 11/11/2018 07:52   Dg Chest Port 1 View  Result Date: 11/10/2018 CLINICAL DATA:  Intubation.  OG tube placement. EXAM: PORTABLE CHEST 1 VIEW COMPARISON:  November 09, 2018 FINDINGS: The ETT remains in good position, 2 cm above the carina. Bilateral pulmonary infiltrates remain, similar on the left and more prominent on the right in the interval. The OG tube terminates below today's film. No other changes. IMPRESSION: Support apparatus as above. Bilateral pulmonary infiltrates are stable on the left and mildly more prominent on the right in the interval. Electronically Signed   By: Dorise Bullion III M.D   On: 11/10/2018 16:27   Dg Chest Port 1 View  Result Date: 11/09/2018 CLINICAL DATA:  53 year old female positive COVID-19 status post PICC placement. EXAM: PORTABLE CHEST 1 VIEW COMPARISON:  Chest radiograph dated 11/04/2018 FINDINGS: A right-sided PICC is noted with tip over the right subclavian vein. Recommend further advancing by an additional approximately 10 cm. Endotracheal tube remains above the carina in similar position. Enteric tube extends below the diaphragm likely within the stomach. Interval progression of bilateral confluent densities since the prior radiograph. No large pleural effusion. There is no pneumothorax. No acute osseous pathology. Stable cardiac silhouette. IMPRESSION: 1. Right-sided PICC with tip over the right subclavian vein. Recommend further advancing an additional 10 cm. 2. Interval progression of bilateral confluent densities. Electronically Signed   By: Anner Crete M.D.   On: 11/09/2018 03:25   Dg Chest Port 1 View  Result Date: 11/04/2018 CLINICAL DATA:  Acute respiratory failure EXAM: PORTABLE CHEST 1 VIEW COMPARISON:  10/31/2018 FINDINGS: Cardiac shadow is stable. Endotracheal tube, right-sided PICC line and gastric catheter are noted in  satisfactory position. Multifocal infiltrates are identified bilaterally left greater than right stable from the prior exam. No bony abnormality is seen. IMPRESSION: Stable bilateral infiltrates Tubes and lines in satisfactory position. Electronically Signed   By: Inez Catalina M.D.   On: 11/04/2018 07:01   Dg Chest Port 1 View  Result Date: 10/31/2018 CLINICAL DATA:  Respiratory difficulty EXAM: PORTABLE CHEST 1 VIEW COMPARISON:  10/28/2018 FINDINGS: Endotracheal and NG tubes stable. Right upper extremity PICC stable. Normal heart size. Bilateral diffuse airspace disease left greater than right is stable. No pneumothorax. IMPRESSION: Stable bilateral airspace disease left greater than right. Electronically Signed   By: Marybelle Killings M.D.   On: 10/31/2018 07:22   Dg Abd Portable 1v  Result Date: 11/21/2018 CLINICAL DATA:  NG tube placement. EXAM: PORTABLE ABDOMEN - 1 VIEW COMPARISON:  Plain films of the abdomen dated 11/20/2018 and 11/19/2018. FINDINGS: Weighted tip feeding tube now appears appropriately positioned with tip in the proximal duodenum. Visualized bowel gas pattern is nonobstructive. IMPRESSION: Weighted tip feeding tube now appears appropriately positioned with tip in the proximal duodenum. Electronically Signed   By: Franki Cabot M.D.   On: 11/21/2018 12:24   Dg Abd Portable 1v  Result Date: 11/20/2018 CLINICAL DATA:  Encounter for NG tube placement EXAM: PORTABLE ABDOMEN - 1 VIEW COMPARISON:  11/19/2018 FINDINGS: Feeding tube coils in the stomach with the  tip in the fundus. Nonobstructive bowel gas pattern. No organomegaly or free air. IMPRESSION: Feeding tube coils in the stomach with the tip in the fundus. Electronically Signed   By: Rolm Baptise M.D.   On: 11/20/2018 19:57   Dg Abd Portable 1v  Result Date: 11/19/2018 CLINICAL DATA:  Vomiting. EXAM: PORTABLE ABDOMEN - 1 VIEW COMPARISON:  None. FINDINGS: A feeding tube coils in the stomach with the distal tip in the fundus. No  evidence of bowel obstruction. IMPRESSION: A feeding tube coils in the stomach. No evidence of bowel obstruction. No cause for vomiting noted. Electronically Signed   By: Dorise Bullion III M.D   On: 11/19/2018 16:43   [place] Ekg Site Rite  Result Date: 11/09/2018 If Site Rite image not attached, placement could not be confirmed due to current cardiac rhythm.

## 2018-11-28 NOTE — Progress Notes (Signed)
Checked with RN to reiterate plan, pt my have ice when Vitals stable, alert and with PMSV in place, but no sips yet. If pt remains stable over the weekend and potentially moves out of the ICU will proceed with MBS next week.  Herbie [place], [place]  Acute Rehabilitation Services Pager [phone] Office [phone]

## 2018-11-28 NOTE — Progress Notes (Signed)
Alert and oriented  X 3. Remains afebrile. On O2 nasal canula at 2 liters. O2 SAT 98%. Passimir valve remains intact; able to make needs known. On Ceftazidmide therapy with no adverse reactions. External urinary catheter intact and draining amber clear urine. Continues on Vital AF via nasogastric tube and is tolerating well. Call bell within reach. Encouraged to report signs and symptoms to RN. Update given to daughter Brette Cast. Transfer orders noted.

## 2018-11-28 NOTE — Progress Notes (Signed)
Patient transferred to room 149. In stable condition.

## 2018-11-28 NOTE — Progress Notes (Signed)
NAME:  Shannon BradfordDeborah Meyer, MRN:  [id number], DOB:  05/21/1966, LOS: 37 ADMISSION DATE:  10/22/2018, CONSULTATION DATE:  10/22/2018 REFERRING MD:  Pilar PlateBero, CHIEF COMPLAINT:  Dyspnea   Brief History   53 y/o female with no PMH [facility] 2018 due to ARDS due to COVID 19 pneumonia.  Past Medical History  None  Significant Hospital Events   5/20 Intubated in ED and [facility] 5/21-5/26 Weaning FIO2 and PEEP, diuresing 5/26-6/3 Tolerating weaning for 1-2 hours 6/7 weaning on pressure support ventilation again this morning 10/5 6/8 extubated, reintubated (tachypnea, tachycardia) 6/12 Trached 6/13 Trach collar all day  6/14 bleeding from tracheostomy site 6/17 48 hours off vent, fever > pseudomonas pneumonia 6/20 slightly more awake, remains off vent  Consults:  Pulmonary and critical care medicine  Procedures:  5/20 ET tube>6/11 Trach 6/11 >> 6/7 PICC line right arm>  Significant Diagnostic Tests:  Oct 28, 2018 CT angiogram chest no pulmonary embolism, bilateral atypical pneumonia consistent with COVID-19 CXR 6/23 - improving infiltrates Micro Data:  SARS-CoV-2 May 20+ Blood culture May 20 1 out of 2 GPC staph, coag negative resp 6/17 > pseudomonas aeruginosa  Antimicrobials:  Zithromax 5/20 >>5/24 Rocephin 5/20 >>5/26 Remdesivir 5/21 >5/30 Actemra 5/20>5/21 Solumedrol 5/12  > 5/26  Vanc 6/17 > x1 Zosyn 6/17 > 6/19 Ceftaz 6/17 >   Interim history/subjective:  Tachypnea appears mildly improved with reduction in carbohydrate load. More awake again today.  Objective   Blood pressure 131/86, pulse 87, temperature 98.2 F (36.8 C), temperature source Oral, resp. rate (!) 34, height 5\' 4"  (1.626 m), weight 87 kg, SpO2 92 %.        Intake/Output Summary (Last 24 hours) at 11/28/2018 1323 Last data filed at 11/28/2018 0907 Gross per 24 hour  Intake 1299.82 ml  Output 250 ml  Net 1049.82 ml   Filed Weights   11/26/18 0500  11/27/18 0500 11/28/18 0500  Weight: 84.6 kg 86.7 kg 87 kg   Examination:  General:  Well appearing, NAD HENT: Tracheostomy site intact.  PULM: Bilateral fine crackles with broncho-vesicular breathing on left. Mild increase in accessory muscle use. CV: Normal HS, extremities warm. GI: Soft, NT, ND and +BS MSK: normal bulk and tone. Trace edema. Neuro: Awake and following commands. Generalized weakness with poor truncal control.  Resolved Hospital Problem list     Assessment & Plan:   Resolving ARDS due to COVID-19 pneumonia Significant tachypnea limits progress and is likely related to still poor lung compliance. Normal ABG suggests tachypnea is in fact compensatory Overfeeding also possible. -Keep off ventilator (did not affect tachypnea in past) -Continue permissive under feeding as may have been contributing to hyperventilation.    Delirium/ICU encephalopathy: improving Continue to wean oxycodone Frequent re-orientation Progressive mobilization.   HCAP Pseudomonas pneumonia  Complete 10 days of ceftazidime. Follow leukocytosis.  Severe physical deconditioning: Physical therapy/Occupational Therapy consult  Best practice:  Diet: Tube feeding as above. Pain/Anxiety/Delirium protocol (if indicated): RASS goal 0 VAP protocol (if indicated): Yes DVT prophylaxis: SCDs GI prophylaxis: Famotidine Glucose control: SSI Mobility: PT consult Code Status: Full Family Communication: will contact today Disposition: transfer to floor   Labs   CBC: Recent Labs  Lab 11/24/18 0240 11/25/18 0155 11/26/18 0518 11/27/18 0608 11/28/18 0300  WBC 23.9* 25.0* 21.0* 12.5* 14.3*  NEUTROABS  --  15.4*  --  7.1 7.2  HGB 12.7 12.7 13.0 10.3* 11.3*  HCT 41.4 40.9 42.3 35.4* 37.5  MCV  95.2 95.1 96.8 98.3 98.2  PLT 435* PLATELET CLUMPS NOTED ON SMEAR, UNABLE TO ESTIMATE 448* 315 497    Basic Metabolic Panel: Recent Labs  Lab 11/24/18 0240 11/25/18 0155 11/26/18 0518  11/27/18 0608 11/28/18 0300  NA 141 143 140 141 142  K 3.8 3.6 3.1* 3.8 3.7  CL 102 104 103 105 106  CO2 27 27 26 29 27   GLUCOSE 98 115* 115* 117* 95  BUN 26* 27* 23* 25* 28*  CREATININE 0.59 0.59 0.41* 0.48 0.57  CALCIUM 9.4 9.2 8.8* 8.8* 8.5*  MG 2.5*  --   --  2.3 2.2  PHOS 4.3  --   --   --   --    GFR: Estimated Creatinine Clearance: 84.8 mL/min (by C-G formula based on SCr of 0.57 mg/dL). Recent Labs  Lab 11/25/18 0155 11/26/18 0518 11/27/18 0608 11/28/18 0300  WBC 25.0* 21.0* 12.5* 14.3*    Liver Function Tests: Recent Labs  Lab 11/23/18 0245 11/24/18 0240 11/26/18 0518 11/27/18 0608 11/28/18 0300  AST 54* 46* 33 27 26  ALT 54* 50* 40 34 30  ALKPHOS 190* 171* 155* 122 114  BILITOT 0.3 0.3 0.2* 0.3 <0.1*  PROT 7.1 7.1 6.8 6.1* 5.8*  ALBUMIN 3.1* 3.2* 3.0* 2.7* 2.6*   No results for input(s): LIPASE, AMYLASE in the last 168 hours. No results for input(s): AMMONIA in the last 168 hours.  ABG    Component Value Date/Time   PHART 7.424 11/22/2018 2116   PCO2ART 41.5 11/22/2018 2116   PO2ART 65.0 (L) 11/22/2018 2116   HCO3 27.1 11/22/2018 2116   TCO2 28 11/22/2018 2116   ACIDBASEDEF 1.0 10/24/2018 0419   O2SAT 92.0 11/22/2018 2116     Coagulation Profile: No results for input(s): INR, PROTIME in the last 168 hours.  Cardiac Enzymes: No results for input(s): CKTOTAL, CKMB, CKMBINDEX, TROPONINI in the last 168 hours.  HbA1C: No results found for: HGBA1C  CBG: Recent Labs  Lab 11/27/18 1937 11/27/18 2334 11/28/18 0310 11/28/18 0735 11/28/18 [place], MD [facility] ICU Physician [place]  Pager: [phone] Mobile: [phone] After hours: [phone].  11/28/2018, 1:23 PM

## 2018-11-29 ENCOUNTER — Inpatient Hospital Stay (HOSPITAL_COMMUNITY): Payer: HRSA Program

## 2018-11-29 LAB — CBC WITH DIFFERENTIAL/PLATELET
Abs Immature Granulocytes: 0.18 10*3/uL — ABNORMAL HIGH (ref 0.00–0.07)
Basophils Absolute: 0.1 10*3/uL (ref 0.0–0.1)
Basophils Relative: 1 %
Eosinophils Absolute: 0.8 10*3/uL — ABNORMAL HIGH (ref 0.0–0.5)
Eosinophils Relative: 5 %
HCT: 39.6 % (ref 36.0–46.0)
Hemoglobin: 11.6 g/dL — ABNORMAL LOW (ref 12.0–15.0)
Immature Granulocytes: 1 %
Lymphocytes Relative: 24 %
Lymphs Abs: 3.6 10*3/uL (ref 0.7–4.0)
MCH: 28.9 pg (ref 26.0–34.0)
MCHC: 29.3 g/dL — ABNORMAL LOW (ref 30.0–36.0)
MCV: 98.8 fL (ref 80.0–100.0)
Monocytes Absolute: 1.3 10*3/uL — ABNORMAL HIGH (ref 0.1–1.0)
Monocytes Relative: 8 %
Neutro Abs: 9.2 10*3/uL — ABNORMAL HIGH (ref 1.7–7.7)
Neutrophils Relative %: 61 %
Platelets: 398 10*3/uL (ref 150–400)
RBC: 4.01 MIL/uL (ref 3.87–5.11)
RDW: 18.1 % — ABNORMAL HIGH (ref 11.5–15.5)
WBC: 15.1 10*3/uL — ABNORMAL HIGH (ref 4.0–10.5)
nRBC: 0 % (ref 0.0–0.2)

## 2018-11-29 LAB — COMPREHENSIVE METABOLIC PANEL
ALT: 38 U/L (ref 0–44)
AST: 36 U/L (ref 15–41)
Albumin: 2.9 g/dL — ABNORMAL LOW (ref 3.5–5.0)
Alkaline Phosphatase: 131 U/L — ABNORMAL HIGH (ref 38–126)
Anion gap: 18 — ABNORMAL HIGH (ref 5–15)
BUN: 22 mg/dL — ABNORMAL HIGH (ref 6–20)
CO2: 31 mmol/L (ref 22–32)
Calcium: 9.6 mg/dL (ref 8.9–10.3)
Chloride: 97 mmol/L — ABNORMAL LOW (ref 98–111)
Creatinine, Ser: 0.45 mg/dL (ref 0.44–1.00)
GFR calc Af Amer: >60 mL/min (ref >60)
GFR calc non Af Amer: >60 mL/min (ref >60)
Glucose, Bld: 108 mg/dL — ABNORMAL HIGH (ref 70–99)
Potassium: 3.8 mmol/L (ref 3.5–5.1)
Sodium: 146 mmol/L — ABNORMAL HIGH (ref 135–145)
Total Bilirubin: 0.1 mg/dL — ABNORMAL LOW (ref 0.3–1.2)
Total Protein: 6.6 g/dL (ref 6.5–8.1)

## 2018-11-29 LAB — GLUCOSE, CAPILLARY
Glucose-Capillary: 100 mg/dL — ABNORMAL HIGH (ref 70–99)
Glucose-Capillary: 101 mg/dL — ABNORMAL HIGH (ref 70–99)
Glucose-Capillary: 102 mg/dL — ABNORMAL HIGH (ref 70–99)
Glucose-Capillary: 108 mg/dL — ABNORMAL HIGH (ref 70–99)
Glucose-Capillary: 126 mg/dL — ABNORMAL HIGH (ref 70–99)

## 2018-11-29 LAB — MAGNESIUM: Magnesium: 2.4 mg/dL (ref 1.7–2.4)

## 2018-11-29 MED ORDER — FUROSEMIDE 20 MG PO TABS
40.0000 mg | ORAL_TABLET | Freq: Every day | ORAL | Status: DC
  Filled 2018-11-29: qty 2

## 2018-11-29 MED ORDER — DEXTROSE 5 % IV SOLN
INTRAVENOUS | Status: DC
  Administered 2018-11-29: 15:00:00 via INTRAVENOUS

## 2018-11-29 MED ORDER — FUROSEMIDE 10 MG/ML IJ SOLN
40.0000 mg | Freq: Once | INTRAMUSCULAR | Status: AC
  Administered 2018-11-29: 08:00:00 40 mg via INTRAVENOUS
  Filled 2018-11-29: qty 4

## 2018-11-29 MED ORDER — FREE WATER
200.0000 mL | Freq: Three times a day (TID) | Status: DC
  Administered 2018-11-29: 200 mL

## 2018-11-29 NOTE — Progress Notes (Signed)
Upon entering pt's room, discovered Cortrak had been removed. TFs turned off.

## 2018-11-29 NOTE — Progress Notes (Signed)
NAME:  Shannon Meyer, MRN:  [id number], DOB:  Jul 13, 1965, LOS: 95 ADMISSION DATE:  10/22/2018, CONSULTATION DATE:  10/22/2018 REFERRING MD:  Sedonia Small, CHIEF COMPLAINT:  Dyspnea   Brief History   53 year old female with no past medical history admitted from [place] on Oct 22, 2018 due to ARDS from COVID-19 pneumonia.  Has had a prolonged hospitalization, required tracheostomy.  Liberated from the ventilator on 6/15.   Past Medical History  none  Significant Hospital Events   5/20 Intubated in ED and admitted to [facility] ICU 5/21-5/26 Weaning FIO2 and PEEP, diuresing 5/26-6/3 Tolerating weaning for 1-2 hours 6/7 weaning on pressure support ventilation again this morning 10/5 6/8 extubated, reintubated (tachypnea, tachycardia) 6/12 Trached 6/13 Trach collar all day  6/14 bleeding from tracheostomy site 6/17 48 hours off vent, fever > pseudomonas pneumonia 6/20 slightly more awake, remains off vent 6/26 transferred to med-surg floor  Consults:  PCCM  Procedures:  May 20 endotracheal tube> June 11 Tracheostomy June 11>  June 7 PICC line right arm>   Significant Diagnostic Tests:  Oct 28, 2018 CT angiogram chest no pulmonary embolism, bilateral atypical pneumonia consistent with COVID-19 CXR 6/23 - improving infiltrates  Micro Data:  May 20 SARS-CoV-2 positive May 20 blood culture> 1 out of 2 GPC coag negative staph June 17 respiratory culture> pseudomonas aeruginosa  Antimicrobials:  Zithromax 5/20 >>5/24 Rocephin 5/20 >>5/26 Remdesivir 5/21 >5/30 Actemra 5/20>5/21 Solumedrol 5/12 >5/26  Vanc 6/17 > x1 Zosyn 6/17 > 6/19 Ceftaz 6/17 > 6/25  Interim history/subjective:  Feels okay Would like to get out of bed Breathing is okay  Objective   Blood pressure (!) 157/76, pulse (!) 101, temperature 98.3 F (36.8 C), temperature source Oral, resp. rate 19, height 5\' 4"  (1.626 m), weight 89.5 kg, SpO2 93 %.        Intake/Output Summary  (Last 24 hours) at 11/29/2018 0716 Last data filed at 11/29/2018 0600 Gross per 24 hour  Intake 763.84 ml  Output 200 ml  Net 563.84 ml   Filed Weights   11/27/18 0500 11/28/18 0500 11/29/18 0403  Weight: 86.7 kg 87 kg 89.5 kg    Examination: General:  Resting comfortably in bed HENT: NCAT tracheostomy site clean/dry/intact PULM: symmetric chest rise, normal effort CV: warm, well perfused GI: nondistended, no masses MSK: normal bulk and tone Neuro: awake, alert, no distress, MAEW   Resolved Hospital Problem list     Assessment & Plan:  ARDS due to COVID-19 pneumonia: Resolving Continue to wean off oxygen  Tracheostomy status: Continue speaking valve When up, moving around and eating normally could downsize or even decannulate If walking this week consider decannulation  Pseudomonas pneumonia: Monitor off ceftaz   Best practice:  Diet: advance Pain/Anxiety/Delirium protocol (if indicated): n/a VAP protocol (if indicated): n/a DVT prophylaxis: sub q hep GI prophylaxis: n/a Glucose control: per TRH Mobility: out of bed, PT consult Code Status: full Family Communication:  Per TRH Disposition: consider [facility]  Labs   CBC: Recent Labs  Lab 11/25/18 0155 11/26/18 0518 11/27/18 0608 11/28/18 0300 11/29/18 0325  WBC 25.0* 21.0* 12.5* 14.3* 15.1*  NEUTROABS 15.4*  --  7.1 7.2 9.2*  HGB 12.7 13.0 10.3* 11.3* 11.6*  HCT 40.9 42.3 35.4* 37.5 39.6  MCV 95.1 96.8 98.3 98.2 98.8  PLT PLATELET CLUMPS NOTED ON SMEAR, UNABLE TO ESTIMATE 448* 315 344 035    Basic Metabolic Panel: Recent Labs  Lab 11/24/18 0240 11/25/18 0155 11/26/18 0518 11/27/18  [id number] 11/28/18 0300  NA 141 143 140 141 142  K 3.8 3.6 3.1* 3.8 3.7  CL 102 104 103 105 106  CO2 27 27 26 29 27   GLUCOSE 98 115* 115* 117* 95  BUN 26* 27* 23* 25* 28*  CREATININE 0.59 0.59 0.41* 0.48 0.57  CALCIUM 9.4 9.2 8.8* 8.8* 8.5*  MG 2.5*  --   --  2.3 2.2  PHOS 4.3  --   --   --   --     GFR: Estimated Creatinine Clearance: 86 mL/min (by C-G formula based on SCr of 0.57 mg/dL). Recent Labs  Lab 11/26/18 0518 11/27/18 0608 11/28/18 0300 11/29/18 0325  WBC 21.0* 12.5* 14.3* 15.1*    Liver Function Tests: Recent Labs  Lab 11/23/18 0245 11/24/18 0240 11/26/18 0518 11/27/18 0608 11/28/18 0300  AST 54* 46* 33 27 26  ALT 54* 50* 40 34 30  ALKPHOS 190* 171* 155* 122 114  BILITOT 0.3 0.3 0.2* 0.3 <0.1*  PROT 7.1 7.1 6.8 6.1* 5.8*  ALBUMIN 3.1* 3.2* 3.0* 2.7* 2.6*   No results for input(s): LIPASE, AMYLASE in the last 168 hours. No results for input(s): AMMONIA in the last 168 hours.  ABG    Component Value Date/Time   PHART 7.424 11/22/2018 2116   PCO2ART 41.5 11/22/2018 2116   PO2ART 65.0 (L) 11/22/2018 2116   HCO3 27.1 11/22/2018 2116   TCO2 28 11/22/2018 2116   ACIDBASEDEF 1.0 10/24/2018 0419   O2SAT 92.0 11/22/2018 2116     Coagulation Profile: No results for input(s): INR, PROTIME in the last 168 hours.  Cardiac Enzymes: No results for input(s): CKTOTAL, CKMB, CKMBINDEX, TROPONINI in the last 168 hours.  HbA1C: No results found for: HGBA1C  CBG: Recent Labs  Lab 11/28/18 1210 11/28/18 1705 11/28/18 1956 11/28/18 2315 11/29/18 0357  GLUCAP 94 89 109* 125* 100*     Critical care time: n/a    Heber CarolinaBrent Senita Corredor, MD [place] [facility] Pager: [phone] Cell: [phone] If no response, call [phone]

## 2018-11-29 NOTE — Progress Notes (Signed)
PROGRESS NOTE                                                                                                                                                                                                             Patient Demographics:    Shannon Meyer, is a 53 y.o. female, DOB - 29-Jun-1965, SWH:[id number]  Outpatient Primary MD for the patient is No primary care provider on file.    LOS - 84  Admit date - 10/22/2018    Chief Complaint  Patient presents with  . Weakness       Brief Narrative  Shannon Meyer is a 53 y.o. female with no significant medical history who presented with cough, weakness and shortness of breath found to have covid-19 with bilateral CXR opacities, intubated in ED and admitted to [facility] 5/20. She has had a prolonged hospital course. She was unable to wean from ventilator and underwent tracheostomy. She began developing fevers and found to have pseudomonas in her sputum. She is currently on antibiotics. Still has significant leukocytosis that is being monitored. Weaning off oxygen. Plan is for CIR/SNF/LTAC once stable.   Subjective:   Patient in bed, appears comfortable, denies any headache, no fever, no chest pain or pressure, no shortness of breath , no abdominal pain. No focal weakness.    Assessment  & Plan :     1. Acute Hypoxic Resp. Failure due to Acute Covid 19 Viral Pneumonitis during the ongoing 2020 Covid 19 Pandemic - she has completed remdesivir 5/21 - 5/30, received actemra 5/20, and 5/21. Received CTX 5/20-5/26, azithromycin 5/20-5/24. Needed to be intubated subsequently trached after she failed extubation on 11/10/2018, currently on trach collar.  Requiring 4 L oxygen per minute.  Continue supportive care. Will advance activity, PT, Med surg transfer.   ABG     Component Value Date/Time   PHART 7.424 11/22/2018 2116   PCO2ART 41.5 11/22/2018 2116   PO2ART 65.0 (L) 11/22/2018 2116   HCO3 27.1 11/22/2018 2116   TCO2 28 11/22/2018 2116   ACIDBASEDEF 1.0 10/24/2018 0419   O2SAT 92.0 11/22/2018 2116    COVID-19 Labs  Recent Labs    11/27/18 0608 11/28/18 0300  DDIMER 0.68* 0.68*  CRP 1.5* 1.7*    Lab Results  Component Value Date   SARSCOV2NAA POSITIVE (A) 10/22/2018     Hepatic  Function Latest Ref Rng & Units 11/29/2018 11/28/2018 11/27/2018  Total Protein 6.5 - 8.1 g/dL 6.6 5.8(L) 6.1(L)  Albumin 3.5 - 5.0 g/dL 2.9(L) 2.6(L) 2.7(L)  AST 15 - 41 U/L 36 26 27  ALT 0 - 44 U/L 38 30 34  Alk Phosphatase 38 - 126 U/L 131(H) 114 122  Total Bilirubin 0.3 - 1.2 mg/dL 0.1(L) <0.1(L) 0.3        Component Value Date/Time   BNP 89.5 11/28/2018 0300      2.  Pseudomonas pneumonia -  Currently on Fortaz and clinically improving.  Continue for now.  Leukocytosis has improved and she is currently afebrile.  Will finish 7 days of treatment on 11/27/2018.  3.  ICU delirium, toxic encephalopathy.  Minimize sedatives and narcotics, Seroquel nightly, melatonin nightly to improve sleep pattern.  Expose to sunlight, up in chair, PT. Much improved.  4.  Sinus tachycardia with questionable brief run of A. fib for a few minutes morning of 11/26/2018.  This would be a single isolated episode, no previous echo on file, she had stable TSH, currently on beta-blocker, unremarkable EKG, will place her on aspirin 81 mg, case discussed with cardiologist Dr. Marlou Porch who recommends no further change in treatment or plan, outpatient echocardiogram and cardiology follow-up.  Mali vas 2 score will be 3 .  5.  Hypokalemia.  Replaced and stable.  6. 1 Out of 4 blood culture contamination.  Stable monitor.  7.  Obesity with a BMI of 34.  Outpatient follow-up with PCP.  8.  Tongue laceration which was noted by nursing staff several days ago, likely while she was intubated.  Healing well.  Supportive care.  Outpatient ENT follow-up if needed.  Speech following.   9.  Tracheostomy induced  dysphagia requiring tube feeds, continue speech following.  Currently on PS MV valve, cleared for ice chips for now, continue to monitor.  NG tube was accidentally pulled by patient on 11/29/2018.  Will replace unless she has been cleared by speech for oral diet.    Condition - Fair  Family Communication  : daughter [name] called 11/28/18 @ 9.30 am   Code Status : Full code  Diet : Tube feeds  Diet Order            Diet NPO time specified Except for: Ice Chips  Diet effective midnight               Disposition - Med  Consults  :  PCCM  Procedures  :   5/20 Intubated in ED and admitted to [facility] ICU 5/21-5/26 Weaning FIO2 and PEEP, diuresing 5/26-6/3 Tolerating weaning for 1-2 hours 6/7 weaning on pressure support ventilation again this morning 10/5 6/8 extubated, reintubated (tachypnea, tachycardia) 6/12 Trached 6/13 Trach collar all day  6/14 bleeding from tracheostomy site 6/17 48 hours off vent, fever > pseudomonas pneumonia 6/20 slightly more awake, remains off vent 6/26 mentation much improved moved out of ICU  PUD Prophylaxis : Famotidine   DVT Prophylaxis  :    Heparin    Lab Results  Component Value Date   PLT 398 11/29/2018    Inpatient Medications  Scheduled Meds: . aspirin  81 mg Per Tube Daily  . chlorhexidine  15 mL Mouth/Throat BID  . Chlorhexidine Gluconate Cloth  6 each Topical Daily  . famotidine  20 mg Per Tube Daily  . [START ON 11/30/2018] furosemide  40 mg Per Tube Daily  . heparin injection (subcutaneous)  7,500 Units  Subcutaneous Q8H  . insulin aspart  0-9 Units Subcutaneous Q4H  . mouth rinse  15 mL Mouth Rinse QID  . metoprolol tartrate  50 mg Per Tube BID  . multivitamin  15 mL Per Tube Daily  . oxyCODONE  5 mg Per Tube Q12H   Followed by  . [START ON 12/01/2018] oxyCODONE  5 mg Per Tube Daily  . sodium chloride flush  10-40 mL Intracatheter Q12H   Continuous Infusions: . sodium chloride 10 mL/hr at 11/27/18 1700   . feeding supplement (VITAL AF 1.2 CAL) Stopped (11/29/18 0545)   PRN Meds:.acetaminophen, hydrALAZINE, ipratropium-albuterol, metoprolol tartrate, ondansetron (ZOFRAN) IV, polyethylene glycol, QUEtiapine, sennosides  Antibiotics  :    Anti-infectives (From admission, onward)   Start     Dose/Rate Route Frequency Ordered Stop   11/21/18 2200  cefTAZidime (FORTAZ) 2 g in sodium chloride 0.9 % 100 mL IVPB     2 g 200 mL/hr over 30 Minutes Intravenous Every 8 hours 11/21/18 1426 11/28/18 0656   11/21/18 1400  cefTAZidime (FORTAZ) 2 g in sodium chloride 0.9 % 100 mL IVPB  Status:  Discontinued     2 g 200 mL/hr over 30 Minutes Intravenous Every 8 hours 11/21/18 1330 11/21/18 1426   11/20/18 0400  vancomycin (VANCOCIN) IVPB 1000 mg/200 mL premix  Status:  Discontinued     1,000 mg 200 mL/hr over 60 Minutes Intravenous Every 12 hours 11/19/18 1517 11/20/18 1344   11/19/18 1530  vancomycin (VANCOCIN) 1,500 mg in sodium chloride 0.9 % 500 mL IVPB     1,500 mg 250 mL/hr over 120 Minutes Intravenous  Once 11/19/18 1517 11/19/18 1744   11/19/18 1530  piperacillin-tazobactam (ZOSYN) IVPB 3.375 g     3.375 g 12.5 mL/hr over 240 Minutes Intravenous Every 8 hours 11/19/18 1517 11/21/18 1734   11/01/18 1000  fluconazole (DIFLUCAN) IVPB 100 mg     100 mg 50 mL/hr over 60 Minutes Intravenous Every 24 hours 11/01/18 0857 11/03/18 1137   10/27/18 2200  cefTRIAXone (ROCEPHIN) 2 g in sodium chloride 0.9 % 100 mL IVPB     2 g 200 mL/hr over 30 Minutes Intravenous Every 24 hours 10/27/18 0835 10/28/18 2243   10/24/18 1400  remdesivir 100 mg in sodium chloride 0.9 % 250 mL IVPB  Status:  Discontinued     100 mg over 30 Minutes Intravenous Every 24 hours 10/23/18 1153 10/24/18 1335   10/24/18 1400  remdesivir 100 mg in sodium chloride 0.9 % 220 mL IVPB  Status:  Discontinued     100 mg over 30 Minutes Intravenous Every 24 hours 10/24/18 1334 10/24/18 1336   10/24/18 1400  remdesivir 100 mg in sodium  chloride 0.9 % 230 mL IVPB     100 mg over 30 Minutes Intravenous Every 24 hours 10/24/18 1335 11/01/18 1500   10/24/18 1400  remdesivir 100 mg in sodium chloride 0.9 % 230 mL IVPB  Status:  Discontinued     100 mg over 30 Minutes Intravenous Every 24 hours 10/24/18 1337 10/24/18 1339   10/23/18 1400  remdesivir 200 mg in sodium chloride 0.9 % 250 mL IVPB     200 mg over 30 Minutes Intravenous Once 10/23/18 1153 10/23/18 1738   10/22/18 2200  azithromycin (ZITHROMAX) 500 mg in sodium chloride 0.9 % 250 mL IVPB     500 mg 250 mL/hr over 60 Minutes Intravenous Every 24 hours 10/22/18 2148 10/26/18 2255   10/22/18 2200  cefTRIAXone (ROCEPHIN) 1 g in sodium  chloride 0.9 % 100 mL IVPB  Status:  Discontinued     1 g 200 mL/hr over 30 Minutes Intravenous Every 24 hours 10/22/18 2148 10/27/18 0835       Time Spent in minutes  30   Lala Lund M.D on 11/29/2018 at 10:11 AM  To page go to [email or web address] - password [email or web address]  [facility] -  Office  [phone]     See all Orders from today for further details    Objective:   Vitals:   11/29/18 0500 11/29/18 0600 11/29/18 0700 11/29/18 0800  BP:    (!) 142/87  Pulse: (!) 111 100 (!) 101 (!) 103  Resp: (!) 28 (!) 29 19 (!) 22  Temp:    98.1 F (36.7 C)  TempSrc:    Axillary  SpO2: 98% 96% 93% 96%  Weight:      Height:        Wt Readings from Last 3 Encounters:  11/29/18 89.5 kg     Intake/Output Summary (Last 24 hours) at 11/29/2018 1011 Last data filed at 11/29/2018 0900 Gross per 24 hour  Intake 753.84 ml  Output 1100 ml  Net -346.16 ml     Physical Exam  Awake will answer basic questions, more alert, trach collar in place, NG tube in place, pubic catheter along with right arm PICC line in place, slightly tachypneic. [place].AT,PERRAL Supple Neck,No JVD, No cervical lymphadenopathy appriciated.  Symmetrical Chest wall movement, Good air movement bilaterally, CTAB RRR,No Gallops, Rubs or new Murmurs, No Parasternal  Heave +ve B.Sounds, Abd Soft, No tenderness, No organomegaly appriciated, No rebound - guarding or rigidity. No Cyanosis, Clubbing or edema, No new Rash or bruise    Data Review:    CBC Recent Labs  Lab 11/25/18 0155 11/26/18 0518 11/27/18 0608 11/28/18 0300 11/29/18 0325  WBC 25.0* 21.0* 12.5* 14.3* 15.1*  HGB 12.7 13.0 10.3* 11.3* 11.6*  HCT 40.9 42.3 35.4* 37.5 39.6  PLT PLATELET CLUMPS NOTED ON SMEAR, UNABLE TO ESTIMATE 448* 315 344 398  MCV 95.1 96.8 98.3 98.2 98.8  MCH 29.5 29.7 28.6 29.6 28.9  MCHC 31.1 30.7 29.1* 30.1 29.3*  RDW 18.9* 19.0* 18.2* 18.3* 18.1*  LYMPHSABS 4.8*  --  3.0 4.0 3.6  MONOABS 2.2*  --  1.3* 1.7* 1.3*  EOSABS 2.1*  --  0.9* 1.2* 0.8*  BASOSABS 0.1  --  0.1 0.1 0.1    Chemistries  Recent Labs  Lab 11/24/18 0240 11/25/18 0155 11/26/18 0518 11/27/18 0608 11/28/18 0300 11/29/18 0325  NA 141 143 140 141 142 146*  K 3.8 3.6 3.1* 3.8 3.7 3.8  CL 102 104 103 105 106 97*  CO2 _0 GLUCOSE 98 115* 115* 117* 95 108*  BUN 26* 27* 23* 25* 28* 22*  CREATININE 0.59 0.59 0.41* 0.48 0.57 0.45  CALCIUM 9.4 9.2 8.8* 8.8* 8.5* 9.6  MG 2.5*  --   --  2.3 2.2 2.4  AST 46*  --  33 27 26 36  ALT 50*  --  40 34 30 38  ALKPHOS 171*  --  155* 122 114 131*  BILITOT 0.3  --  0.2* 0.3 <0.1* 0.1*   ------------------------------------------------------------------------------------------------------------------ No results for input(s): CHOL, HDL, LDLCALC, TRIG, CHOLHDL, LDLDIRECT in the last 72 hours.  No results found for: HGBA1C ------------------------------------------------------------------------------------------------------------------ No results for input(s): TSH, T4TOTAL, T3FREE, THYROIDAB in the last 72 hours.  Invalid input(s): FREET3  Lab Results  Component Value Date   TSH  1.034 11/19/2018    Cardiac Enzymes No results for input(s): CKMB, TROPONINI, MYOGLOBIN in the last 168 hours.  Invalid input(s): CK  ------------------------------------------------------------------------------------------------------------------    Component Value Date/Time   BNP 89.5 11/28/2018 0300    Micro Results Recent Results (from the past 240 hour(s))  Culture, respiratory (non-expectorated)     Status: None   Collection Time: 11/19/18 10:59 AM   Specimen: Tracheal Aspirate; Respiratory  Result Value Ref Range Status   Specimen Description   Final    TRACHEAL ASPIRATE Performed at [facility] [address] [place], [place] [address]    Special Requests   Final    NONE Performed at [facility], [place] [address] [place], [place] [address]    Gram Stain   Final    MODERATE WBC PRESENT,BOTH PMN AND MONONUCLEAR FEW GRAM NEGATIVE RODS FEW GRAM POSITIVE COCCI RARE GRAM VARIABLE ROD RARE YEAST Performed at [facility], [place] [address], [place], [place] [address]    Culture FEW PSEUDOMONAS AERUGINOSA  Final   Report Status 11/21/2018 FINAL  Final   Organism ID, Bacteria PSEUDOMONAS AERUGINOSA  Final      Susceptibility   Pseudomonas aeruginosa - MIC*    CEFTAZIDIME 4 SENSITIVE Sensitive     CIPROFLOXACIN <=0.25 SENSITIVE Sensitive     GENTAMICIN <=1 SENSITIVE Sensitive     IMIPENEM <=0.25 SENSITIVE Sensitive     PIP/TAZO <=4 SENSITIVE Sensitive     CEFEPIME <=1 SENSITIVE Sensitive     * FEW PSEUDOMONAS AERUGINOSA  MRSA PCR Screening     Status: None   Collection Time: 11/28/18  2:00 PM   Specimen: Nasal Mucosa; Nasopharyngeal  Result Value Ref Range Status   MRSA by PCR NEGATIVE NEGATIVE Final    Comment:        The GeneXpert MRSA Assay (FDA approved for NASAL specimens only), is one component of a comprehensive MRSA colonization surveillance program. It is not intended to diagnose MRSA infection nor to guide or monitor treatment for MRSA infections. Performed at [facility], [place] [address], [place], [place] [address]      Radiology Reports Dg Abd 1 View  Result Date: 11/10/2018 CLINICAL DATA:  Orogastric placement EXAM: ABDOMEN - 1 VIEW COMPARISON:  None. FINDINGS: Orogastric tube enters the stomach, loops in the body and has its tip in the antrum. Bowel gas pattern appears normal. IMPRESSION: Orogastric tip in the antrum. Electronically Signed   By: Nelson Chimes M.D.   On: 11/10/2018 16:50   Dg Chest Port 1 View  Result Date: 11/25/2018 CLINICAL DATA:  ARDS, COVID-19 positive EXAM: PORTABLE CHEST 1 VIEW COMPARISON:  Portable exam 0508 hours compared to 11/19/2018 FINDINGS: Tracheostomy tube unchanged. Tip of RIGHT arm PICC line projects over cavoatrial junction. Feeding tube extends to at least the second portion of the duodenum. Stable heart size. Diffuse BILATERAL airspace infiltrates, unchanged. No pleural effusion or pneumothorax. IMPRESSION: Persistent diffuse BILATERAL airspace infiltrates Electronically Signed   By: Lavonia Dana M.D.   On: 11/25/2018 08:08   Dg Chest Port 1 View  Result Date: 11/19/2018 CLINICAL DATA:  Fever EXAM: PORTABLE CHEST 1 VIEW COMPARISON:  11/13/2018, 11/11/2018 FINDINGS: Tracheostomy tube in satisfactory position. Feeding tube coiled in the stomach. Right-sided PICC line with the tip projecting over the SVC. Worsening bilateral patchy interstitial and alveolar airspace disease throughout bilateral lungs concerning for multilobar pneumonia. No pleural effusion or pneumothorax. Stable cardiomediastinal silhouette. No aggressive osseous lesion. IMPRESSION: 1. Support  lines and tubing in satisfactory position. 2. Worsening bilateral patchy interstitial and alveolar airspace disease throughout bilateral lungs concerning for multilobar pneumonia. Electronically Signed   By: Kathreen Devoid   On: 11/19/2018 13:38   Dg Chest Port 1 View  Result Date: 11/13/2018 CLINICAL DATA:  Tracheostomy placement.  COVID-19. EXAM: PORTABLE CHEST 1 VIEW COMPARISON:  11/11/2018. FINDINGS: Tracheostomy is been  inserted. Tip lies 4.5 cm above carina. BILATERAL pulmonary opacities persist, LEFT greater than RIGHT, not significantly improved. Cardiomegaly. Feeding tube duodenum. IMPRESSION: Satisfactory tracheostomy placement. No significant improvement aeration. Electronically Signed   By: Staci Righter M.D.   On: 11/13/2018 16:16   Dg Chest Port 1 View  Result Date: 11/11/2018 CLINICAL DATA:  Follow-up aspiration EXAM: PORTABLE CHEST 1 VIEW COMPARISON:  11/10/2018 FINDINGS: Cardiac shadow is stable. Endotracheal tube and gastric catheter are noted in satisfactory position. Right-sided PICC line is noted at the cavoatrial junction. Patchy infiltrates are again seen bilaterally stable from the prior exam. No pneumothorax is noted. IMPRESSION: No change from the previous day. Electronically Signed   By: Inez Catalina M.D.   On: 11/11/2018 07:52   Dg Chest Port 1 View  Result Date: 11/10/2018 CLINICAL DATA:  Intubation.  OG tube placement. EXAM: PORTABLE CHEST 1 VIEW COMPARISON:  November 09, 2018 FINDINGS: The ETT remains in good position, 2 cm above the carina. Bilateral pulmonary infiltrates remain, similar on the left and more prominent on the right in the interval. The OG tube terminates below today's film. No other changes. IMPRESSION: Support apparatus as above. Bilateral pulmonary infiltrates are stable on the left and mildly more prominent on the right in the interval. Electronically Signed   By: Dorise Bullion III M.D   On: 11/10/2018 16:27   Dg Chest Port 1 View  Result Date: 11/09/2018 CLINICAL DATA:  53 year old female positive COVID-19 status post PICC placement. EXAM: PORTABLE CHEST 1 VIEW COMPARISON:  Chest radiograph dated 11/04/2018 FINDINGS: A right-sided PICC is noted with tip over the right subclavian vein. Recommend further advancing by an additional approximately 10 cm. Endotracheal tube remains above the carina in similar position. Enteric tube extends below the diaphragm likely within the stomach.  Interval progression of bilateral confluent densities since the prior radiograph. No large pleural effusion. There is no pneumothorax. No acute osseous pathology. Stable cardiac silhouette. IMPRESSION: 1. Right-sided PICC with tip over the right subclavian vein. Recommend further advancing an additional 10 cm. 2. Interval progression of bilateral confluent densities. Electronically Signed   By: Anner Crete M.D.   On: 11/09/2018 03:25   Dg Chest Port 1 View  Result Date: 11/04/2018 CLINICAL DATA:  Acute respiratory failure EXAM: PORTABLE CHEST 1 VIEW COMPARISON:  10/31/2018 FINDINGS: Cardiac shadow is stable. Endotracheal tube, right-sided PICC line and gastric catheter are noted in satisfactory position. Multifocal infiltrates are identified bilaterally left greater than right stable from the prior exam. No bony abnormality is seen. IMPRESSION: Stable bilateral infiltrates Tubes and lines in satisfactory position. Electronically Signed   By: Inez Catalina M.D.   On: 11/04/2018 07:01   Dg Chest Port 1 View  Result Date: 10/31/2018 CLINICAL DATA:  Respiratory difficulty EXAM: PORTABLE CHEST 1 VIEW COMPARISON:  10/28/2018 FINDINGS: Endotracheal and NG tubes stable. Right upper extremity PICC stable. Normal heart size. Bilateral diffuse airspace disease left greater than right is stable. No pneumothorax. IMPRESSION: Stable bilateral airspace disease left greater than right. Electronically Signed   By: Marybelle Killings M.D.   On: 10/31/2018 07:22  Dg Abd Portable 1v  Result Date: 11/21/2018 CLINICAL DATA:  NG tube placement. EXAM: PORTABLE ABDOMEN - 1 VIEW COMPARISON:  Plain films of the abdomen dated 11/20/2018 and 11/19/2018. FINDINGS: Weighted tip feeding tube now appears appropriately positioned with tip in the proximal duodenum. Visualized bowel gas pattern is nonobstructive. IMPRESSION: Weighted tip feeding tube now appears appropriately positioned with tip in the proximal duodenum. Electronically  Signed   By: Franki Cabot M.D.   On: 11/21/2018 12:24   Dg Abd Portable 1v  Result Date: 11/20/2018 CLINICAL DATA:  Encounter for NG tube placement EXAM: PORTABLE ABDOMEN - 1 VIEW COMPARISON:  11/19/2018 FINDINGS: Feeding tube coils in the stomach with the tip in the fundus. Nonobstructive bowel gas pattern. No organomegaly or free air. IMPRESSION: Feeding tube coils in the stomach with the tip in the fundus. Electronically Signed   By: Rolm Baptise M.D.   On: 11/20/2018 19:57   Dg Abd Portable 1v  Result Date: 11/19/2018 CLINICAL DATA:  Vomiting. EXAM: PORTABLE ABDOMEN - 1 VIEW COMPARISON:  None. FINDINGS: A feeding tube coils in the stomach with the distal tip in the fundus. No evidence of bowel obstruction. IMPRESSION: A feeding tube coils in the stomach. No evidence of bowel obstruction. No cause for vomiting noted. Electronically Signed   By: Dorise Bullion III M.D   On: 11/19/2018 16:43   [place] Ekg Site Rite  Result Date: 11/09/2018 If Site Rite image not attached, placement could not be confirmed due to current cardiac rhythm.

## 2018-11-30 LAB — GLUCOSE, CAPILLARY
Glucose-Capillary: 108 mg/dL — ABNORMAL HIGH (ref 70–99)
Glucose-Capillary: 113 mg/dL — ABNORMAL HIGH (ref 70–99)
Glucose-Capillary: 115 mg/dL — ABNORMAL HIGH (ref 70–99)
Glucose-Capillary: 115 mg/dL — ABNORMAL HIGH (ref 70–99)
Glucose-Capillary: 119 mg/dL — ABNORMAL HIGH (ref 70–99)
Glucose-Capillary: 119 mg/dL — ABNORMAL HIGH (ref 70–99)
Glucose-Capillary: 128 mg/dL — ABNORMAL HIGH (ref 70–99)

## 2018-11-30 LAB — CBC WITH DIFFERENTIAL/PLATELET
Abs Immature Granulocytes: 0.14 10*3/uL — ABNORMAL HIGH (ref 0.00–0.07)
Basophils Absolute: 0.1 10*3/uL (ref 0.0–0.1)
Basophils Relative: 1 %
Eosinophils Absolute: 0.6 10*3/uL — ABNORMAL HIGH (ref 0.0–0.5)
Eosinophils Relative: 4 %
HCT: 40 % (ref 36.0–46.0)
Hemoglobin: 11.8 g/dL — ABNORMAL LOW (ref 12.0–15.0)
Immature Granulocytes: 1 %
Lymphocytes Relative: 25 %
Lymphs Abs: 4.1 10*3/uL — ABNORMAL HIGH (ref 0.7–4.0)
MCH: 28.6 pg (ref 26.0–34.0)
MCHC: 29.5 g/dL — ABNORMAL LOW (ref 30.0–36.0)
MCV: 96.9 fL (ref 80.0–100.0)
Monocytes Absolute: 1.5 10*3/uL — ABNORMAL HIGH (ref 0.1–1.0)
Monocytes Relative: 9 %
Neutro Abs: 10.4 10*3/uL — ABNORMAL HIGH (ref 1.7–7.7)
Neutrophils Relative %: 60 %
Platelets: 347 10*3/uL (ref 150–400)
RBC: 4.13 MIL/uL (ref 3.87–5.11)
RDW: 17.8 % — ABNORMAL HIGH (ref 11.5–15.5)
WBC: 16.9 10*3/uL — ABNORMAL HIGH (ref 4.0–10.5)
nRBC: 0.1 % (ref 0.0–0.2)

## 2018-11-30 LAB — COMPREHENSIVE METABOLIC PANEL
ALT: 38 U/L (ref 0–44)
AST: 57 U/L — ABNORMAL HIGH (ref 15–41)
Albumin: 2.9 g/dL — ABNORMAL LOW (ref 3.5–5.0)
Alkaline Phosphatase: 126 U/L (ref 38–126)
Anion gap: 14 (ref 5–15)
BUN: 21 mg/dL — ABNORMAL HIGH (ref 6–20)
CO2: 26 mmol/L (ref 22–32)
Calcium: 8.8 mg/dL — ABNORMAL LOW (ref 8.9–10.3)
Chloride: 97 mmol/L — ABNORMAL LOW (ref 98–111)
Creatinine, Ser: 0.54 mg/dL (ref 0.44–1.00)
GFR calc Af Amer: >60 mL/min (ref >60)
GFR calc non Af Amer: >60 mL/min (ref >60)
Glucose, Bld: 92 mg/dL (ref 70–99)
Potassium: 4.7 mmol/L (ref 3.5–5.1)
Sodium: 137 mmol/L (ref 135–145)
Total Bilirubin: 0.7 mg/dL (ref 0.3–1.2)
Total Protein: 6.5 g/dL (ref 6.5–8.1)

## 2018-11-30 LAB — MAGNESIUM: Magnesium: 2.6 mg/dL — ABNORMAL HIGH (ref 1.7–2.4)

## 2018-11-30 MED ORDER — HYDROCODONE-ACETAMINOPHEN 5-325 MG PO TABS: 1.0000 | ORAL_TABLET | Freq: Two times a day (BID) | ORAL | Status: DC | PRN

## 2018-11-30 MED ORDER — ASPIRIN 300 MG RE SUPP
150.0000 mg | Freq: Every day | RECTAL | Status: DC
  Administered 2018-11-30 – 2018-12-01 (×2): 150 mg via RECTAL
  Filled 2018-11-30 (×3): qty 1

## 2018-11-30 MED ORDER — DEXTROSE 5 % IV SOLN
INTRAVENOUS | Status: DC
  Administered 2018-11-30 – 2018-12-03 (×6): via INTRAVENOUS

## 2018-11-30 MED ORDER — PANTOPRAZOLE SODIUM 40 MG IV SOLR
40.0000 mg | Freq: Every day | INTRAVENOUS | Status: DC
  Administered 2018-11-30 – 2018-12-01 (×2): 40 mg via INTRAVENOUS
  Filled 2018-11-30 (×2): qty 40

## 2018-11-30 MED ORDER — HALOPERIDOL LACTATE 5 MG/ML IJ SOLN: 2.0000 mg | Freq: Four times a day (QID) | INTRAMUSCULAR | Status: DC | PRN

## 2018-11-30 NOTE — Progress Notes (Signed)
Patients daughter called and informed of wrist restrains initiation  to maintain safety and avoid  trach being pulled out/ daughter [name] agreeable with plan of care. Will continue to monitor patient closely as shift progresses

## 2018-11-30 NOTE — Progress Notes (Signed)
Patient pulled out her NGT . Provider paged and informed, awaiting response if to replace it @ this time. I will continue to monitor patient

## 2018-11-30 NOTE — Progress Notes (Signed)
Patient also pulling  at trach some blood noted in collar, attempted to place new  NGT when patient started coughing up blood stained saliva and mucus  / placement . aborted  provider called. Order given to initiate  Wrist Restraints and hold NGT placement at this time. Sats 98-99% with trach collar. Patient appears to be non distressed at this time. Will continue to monitor as shift progresses.

## 2018-11-30 NOTE — Progress Notes (Signed)
Unable to place NGT x 3 attempts.  Physician aware.

## 2018-11-30 NOTE — Progress Notes (Signed)
Facetimed patient's children, Aulani Shipton and then [employer]. Patient was very happy to meet her new via facetime granddaughter born today.  Questions were encouraged and answered.

## 2018-11-30 NOTE — Progress Notes (Signed)
Reached out to patient's daughter [name] and gave a update of patient's general  wellbeing. Face-time call also initiated  with daughter/mother. I encouraged family  to feel free to  call with concerns/they are agreeable

## 2018-11-30 NOTE — Plan of Care (Signed)
Paged Dr. Cloyd Stagers to advise on patient's scheduled Metoprolol dose via tube. Patient pulled tube out on 6/27. MD advised to skip dose and administer PRN Apresoline if needed. Will reevaluate after speech eval tomorrow.

## 2018-11-30 NOTE — Progress Notes (Signed)
PROGRESS NOTE                                                                                                                                                                                                             Patient Demographics:    Shannon Meyer, is a 53 y.o. female, DOB - [birth year], BLT:[id number]  Outpatient Primary MD for the patient is No primary care provider on file.    LOS - 84  Admit date - 10/22/2018    Chief Complaint  Patient presents with   Weakness       Brief Narrative  Shannon Meyer is a 54 y.o. female with no significant medical history who presented with cough, weakness and shortness of breath found to have covid-19 with bilateral CXR opacities, intubated in ED and admitted to [facility] 5/20. She has had a prolonged hospital course. She was unable to wean from ventilator and underwent tracheostomy. She began developing fevers and found to have pseudomonas in her sputum. She is currently on antibiotics. Still has significant leukocytosis that is being monitored. Weaning off oxygen. Plan is for CIR/SNF/LTAC once stable.   Subjective:   Patient in bed, appears comfortable, denies any headache, no fever, no chest pain or pressure, no shortness of breath , no abdominal pain. No focal weakness.    Assessment  & Plan :     1. Acute Hypoxic Resp. Failure due to Acute Covid 19 Viral Pneumonitis during the ongoing 2020 Covid 19 Pandemic - she has completed remdesivir 5/21 - 5/30, received actemra 5/20, and 5/21. Received CTX 5/20-5/26, azithromycin 5/20-5/24. Needed to be intubated subsequently trached after she failed extubation on 11/10/2018, currently on trach collar.  Requiring 4 L oxygen per minute.  Continue supportive care. Will advance activity, PT, Med surg transfer.   ABG     Component Value Date/Time   PHART 7.424 11/22/2018 2116   PCO2ART 41.5 11/22/2018 2116   PO2ART 65.0 (L) 11/22/2018 2116   HCO3 27.1 11/22/2018 2116   TCO2 28 11/22/2018 2116   ACIDBASEDEF 1.0 10/24/2018 0419   O2SAT 92.0 11/22/2018 2116    COVID-19 Labs  Recent Labs    11/28/18 0300  DDIMER 0.68*  CRP 1.7*    Lab Results  Component Value Date   SARSCOV2NAA POSITIVE (A) 10/22/2018     Hepatic Function Latest Ref Rng &  Units 11/30/2018 11/29/2018 11/28/2018  Total Protein 6.5 - 8.1 g/dL 6.5 6.6 5.8(L)  Albumin 3.5 - 5.0 g/dL 2.9(L) 2.9(L) 2.6(L)  AST 15 - 41 U/L 57(H) 36 26  ALT 0 - 44 U/L 38 38 30  Alk Phosphatase 38 - 126 U/L 126 131(H) 114  Total Bilirubin 0.3 - 1.2 mg/dL 0.7 0.1(L) <0.1(L)        Component Value Date/Time   BNP 89.5 11/28/2018 0300      2.  Pseudomonas pneumonia -  Currently on Fortaz and clinically improving.  Continue for now.  Leukocytosis has improved and she is currently afebrile.  Will finish 7 days of treatment on 11/27/2018.  3.  ICU delirium, toxic encephalopathy.  Minimize sedatives and narcotics, Seroquel nightly, sitter if needed, restraints only if becomes danger to self or others by pulling lines or tubes.  Expose to sunlight, up in chair, PT. Much improved.  4.  Sinus tachycardia with questionable brief run of A. fib for a few minutes morning of 11/26/2018.  This would be a single isolated episode, no previous echo on file, she had stable TSH, currently on beta-blocker, unremarkable EKG, will place her on aspirin 81 mg, case discussed with cardiologist Dr. Marlou Porch who recommends no further change in treatment or plan, outpatient echocardiogram and cardiology follow-up.  Mali vas 2 score will be 3 .  5.  Hypokalemia.  Replaced and stable.  6. 1 Out of 4 blood culture contamination.  Stable monitor.  7.  Obesity with a BMI of 34.  Outpatient follow-up with PCP.  8.  Tongue laceration which was noted by nursing staff several days ago, likely while she was intubated.  Healing well.  Supportive care.  Outpatient ENT follow-up if needed.  Speech following.   9.   Tracheostomy induced dysphagia requiring tube feeds, continue speech following.  Currently on PS MV valve, cleared for ice chips for now, continue to monitor.  NG tube was accidentally pulled by patient on 11/29/2018.  Will replace unless she has been cleared by speech for oral diet.    Condition - Fair  Family Communication  : daughter Shannon Meyer called 11/28/18 @ 9.30 am   Code Status : Full code  Diet : Tube feeds  Diet Order            Diet NPO time specified Except for: Ice Chips  Diet effective midnight               Disposition - Med  Consults  :  PCCM  Procedures  :   5/20 Intubated in ED and admitted to [facility] ICU 5/21-5/26 Weaning FIO2 and PEEP, diuresing 5/26-6/3 Tolerating weaning for 1-2 hours 6/7 weaning on pressure support ventilation again this morning 10/5 6/8 extubated, reintubated (tachypnea, tachycardia) 6/12 Trached 6/13 Trach collar all day  6/14 bleeding from tracheostomy site 6/17 48 hours off vent, fever > pseudomonas pneumonia 6/20 slightly more awake, remains off vent 6/26 mentation much improved moved out of ICU  PUD Prophylaxis : Famotidine   DVT Prophylaxis  :    Heparin    Lab Results  Component Value Date   PLT 347 11/30/2018    Inpatient Medications  Scheduled Meds:  aspirin  81 mg Per Tube Daily   chlorhexidine  15 mL Mouth/Throat BID   Chlorhexidine Gluconate Cloth  6 each Topical Daily   famotidine  20 mg Per Tube Daily   free water  200 mL Per Tube Q8H   heparin injection (  subcutaneous)  7,500 Units Subcutaneous Q8H   insulin aspart  0-9 Units Subcutaneous Q4H   mouth rinse  15 mL Mouth Rinse QID   metoprolol tartrate  50 mg Per Tube BID   multivitamin  15 mL Per Tube Daily   sodium chloride flush  10-40 mL Intracatheter Q12H   Continuous Infusions:  dextrose     feeding supplement (VITAL AF 1.2 CAL) Stopped (11/29/18 0545)   PRN Meds:.acetaminophen, hydrALAZINE, HYDROcodone-acetaminophen,  ipratropium-albuterol, metoprolol tartrate, ondansetron (ZOFRAN) IV, polyethylene glycol, QUEtiapine, sennosides  Antibiotics  :    Anti-infectives (From admission, onward)   Start     Dose/Rate Route Frequency Ordered Stop   11/21/18 2200  cefTAZidime (FORTAZ) 2 g in sodium chloride 0.9 % 100 mL IVPB     2 g 200 mL/hr over 30 Minutes Intravenous Every 8 hours 11/21/18 1426 11/28/18 0656   11/21/18 1400  cefTAZidime (FORTAZ) 2 g in sodium chloride 0.9 % 100 mL IVPB  Status:  Discontinued     2 g 200 mL/hr over 30 Minutes Intravenous Every 8 hours 11/21/18 1330 11/21/18 1426   11/20/18 0400  vancomycin (VANCOCIN) IVPB 1000 mg/200 mL premix  Status:  Discontinued     1,000 mg 200 mL/hr over 60 Minutes Intravenous Every 12 hours 11/19/18 1517 11/20/18 1344   11/19/18 1530  vancomycin (VANCOCIN) 1,500 mg in sodium chloride 0.9 % 500 mL IVPB     1,500 mg 250 mL/hr over 120 Minutes Intravenous  Once 11/19/18 1517 11/19/18 1744   11/19/18 1530  piperacillin-tazobactam (ZOSYN) IVPB 3.375 g     3.375 g 12.5 mL/hr over 240 Minutes Intravenous Every 8 hours 11/19/18 1517 11/21/18 1734   11/01/18 1000  fluconazole (DIFLUCAN) IVPB 100 mg     100 mg 50 mL/hr over 60 Minutes Intravenous Every 24 hours 11/01/18 0857 11/03/18 1137   10/27/18 2200  cefTRIAXone (ROCEPHIN) 2 g in sodium chloride 0.9 % 100 mL IVPB     2 g 200 mL/hr over 30 Minutes Intravenous Every 24 hours 10/27/18 0835 10/28/18 2243   10/24/18 1400  remdesivir 100 mg in sodium chloride 0.9 % 250 mL IVPB  Status:  Discontinued     100 mg over 30 Minutes Intravenous Every 24 hours 10/23/18 1153 10/24/18 1335   10/24/18 1400  remdesivir 100 mg in sodium chloride 0.9 % 220 mL IVPB  Status:  Discontinued     100 mg over 30 Minutes Intravenous Every 24 hours 10/24/18 1334 10/24/18 1336   10/24/18 1400  remdesivir 100 mg in sodium chloride 0.9 % 230 mL IVPB     100 mg over 30 Minutes Intravenous Every 24 hours 10/24/18 1335 11/01/18 1500    10/24/18 1400  remdesivir 100 mg in sodium chloride 0.9 % 230 mL IVPB  Status:  Discontinued     100 mg over 30 Minutes Intravenous Every 24 hours 10/24/18 1337 10/24/18 1339   10/23/18 1400  remdesivir 200 mg in sodium chloride 0.9 % 250 mL IVPB     200 mg over 30 Minutes Intravenous Once 10/23/18 1153 10/23/18 1738   10/22/18 2200  azithromycin (ZITHROMAX) 500 mg in sodium chloride 0.9 % 250 mL IVPB     500 mg 250 mL/hr over 60 Minutes Intravenous Every 24 hours 10/22/18 2148 10/26/18 2255   10/22/18 2200  cefTRIAXone (ROCEPHIN) 1 g in sodium chloride 0.9 % 100 mL IVPB  Status:  Discontinued     1 g 200 mL/hr over 30 Minutes Intravenous Every 24  hours 10/22/18 2148 10/27/18 0835       Time Spent in minutes  30   Lala Lund M.D on 11/30/2018 at 10:32 AM  To page go to [email or web address] - password [facility]  [facility] -  Office  [phone]     See all Orders from today for further details    Objective:   Vitals:   11/30/18 0428 11/30/18 0530 11/30/18 0748 11/30/18 0822  BP:   132/83   Pulse:   93 (!) 101  Resp:    (!) 28  Temp:   98.7 F (37.1 C)   TempSrc:   Oral   SpO2:   92%   Weight: 87.5 kg 87.9 kg    Height:        Wt Readings from Last 3 Encounters:  11/30/18 87.9 kg    No intake or output data in the 24 hours ending 11/30/18 1032   Physical Exam  Awake, minimally confused, trach collar in place, NG tube in place, pubic catheter along with right arm PICC line in place, slightly tachypneic. Kempner.AT,PERRAL Supple Neck,No JVD, No cervical lymphadenopathy appriciated.  Symmetrical Chest wall movement, Good air movement bilaterally, CTAB RRR,No Gallops, Rubs or new Murmurs, No Parasternal Heave +ve B.Sounds, Abd Soft, No tenderness, No organomegaly appriciated, No rebound - guarding or rigidity. No Cyanosis, Clubbing or edema, No new Rash or bruise    Data Review:    CBC Recent Labs  Lab 11/25/18 0155 11/26/18 0518 11/27/18 0608  11/28/18 0300 11/29/18 0325 11/30/18 0645  WBC 25.0* 21.0* 12.5* 14.3* 15.1* 16.9*  HGB 12.7 13.0 10.3* 11.3* 11.6* 11.8*  HCT 40.9 42.3 35.4* 37.5 39.6 40.0  PLT PLATELET CLUMPS NOTED ON SMEAR, UNABLE TO ESTIMATE 448* 315 344 398 347  MCV 95.1 96.8 98.3 98.2 98.8 96.9  MCH 29.5 29.7 28.6 29.6 28.9 28.6  MCHC 31.1 30.7 29.1* 30.1 29.3* 29.5*  RDW 18.9* 19.0* 18.2* 18.3* 18.1* 17.8*  LYMPHSABS 4.8*  --  3.0 4.0 3.6 4.1*  MONOABS 2.2*  --  1.3* 1.7* 1.3* 1.5*  EOSABS 2.1*  --  0.9* 1.2* 0.8* 0.6*  BASOSABS 0.1  --  0.1 0.1 0.1 0.1    Chemistries  Recent Labs  Lab 11/24/18 0240  11/26/18 0518 11/27/18 0608 11/28/18 0300 11/29/18 0325 11/30/18 0645  NA 141   < > 140 141 142 146* 137  K 3.8   < > 3.1* 3.8 3.7 3.8 4.7  CL 102   < > 103 105 106 97* 97*  CO2 27   < > _0 GLUCOSE 98   < > 115* 117* 95 108* 92  BUN 26*   < > 23* 25* 28* 22* 21*  CREATININE 0.59   < > 0.41* 0.48 0.57 0.45 0.54  CALCIUM 9.4   < > 8.8* 8.8* 8.5* 9.6 8.8*  MG 2.5*  --   --  2.3 2.2 2.4 2.6*  AST 46*  --  33 27 26 36 57*  ALT 50*  --  40 34 30 38 38  ALKPHOS 171*  --  155* 122 114 131* 126  BILITOT 0.3  --  0.2* 0.3 <0.1* 0.1* 0.7   < > = values in this interval not displayed.   ------------------------------------------------------------------------------------------------------------------ No results for input(s): CHOL, HDL, LDLCALC, TRIG, CHOLHDL, LDLDIRECT in the last 72 hours.  No results found for: HGBA1C ------------------------------------------------------------------------------------------------------------------ No results for input(s): TSH, T4TOTAL, T3FREE, THYROIDAB in the last 72 hours.  Invalid input(s):  FREET3  Lab Results  Component Value Date   TSH 1.034 11/19/2018    Cardiac Enzymes No results for input(s): CKMB, TROPONINI, MYOGLOBIN in the last 168 hours.  Invalid input(s):  CK ------------------------------------------------------------------------------------------------------------------    Component Value Date/Time   BNP 89.5 11/28/2018 0300    Micro Results Recent Results (from the past 240 hour(s))  MRSA PCR Screening     Status: None   Collection Time: 11/28/18  2:00 PM   Specimen: Nasal Mucosa; Nasopharyngeal  Result Value Ref Range Status   MRSA by PCR NEGATIVE NEGATIVE Final    Comment:        The GeneXpert MRSA Assay (FDA approved for NASAL specimens only), is one component of a comprehensive MRSA colonization surveillance program. It is not intended to diagnose MRSA infection nor to guide or monitor treatment for MRSA infections. Performed at [facility], [place] [address], [place], [place] [address]     Radiology Reports Dg Abd 1 View  Result Date: 11/10/2018 CLINICAL DATA:  Orogastric placement EXAM: ABDOMEN - 1 VIEW COMPARISON:  None. FINDINGS: Orogastric tube enters the stomach, loops in the body and has its tip in the antrum. Bowel gas pattern appears normal. IMPRESSION: Orogastric tip in the antrum. Electronically Signed   By: Nelson Chimes M.D.   On: 11/10/2018 16:50   Dg Chest Port 1 View  Result Date: 11/25/2018 CLINICAL DATA:  ARDS, COVID-19 positive EXAM: PORTABLE CHEST 1 VIEW COMPARISON:  Portable exam 0508 hours compared to 11/19/2018 FINDINGS: Tracheostomy tube unchanged. Tip of RIGHT arm PICC line projects over cavoatrial junction. Feeding tube extends to at least the second portion of the duodenum. Stable heart size. Diffuse BILATERAL airspace infiltrates, unchanged. No pleural effusion or pneumothorax. IMPRESSION: Persistent diffuse BILATERAL airspace infiltrates Electronically Signed   By: Lavonia Dana M.D.   On: 11/25/2018 08:08   Dg Chest Port 1 View  Result Date: 11/19/2018 CLINICAL DATA:  Fever EXAM: PORTABLE CHEST 1 VIEW COMPARISON:  11/13/2018, 11/11/2018 FINDINGS: Tracheostomy tube in satisfactory  position. Feeding tube coiled in the stomach. Right-sided PICC line with the tip projecting over the SVC. Worsening bilateral patchy interstitial and alveolar airspace disease throughout bilateral lungs concerning for multilobar pneumonia. No pleural effusion or pneumothorax. Stable cardiomediastinal silhouette. No aggressive osseous lesion. IMPRESSION: 1. Support lines and tubing in satisfactory position. 2. Worsening bilateral patchy interstitial and alveolar airspace disease throughout bilateral lungs concerning for multilobar pneumonia. Electronically Signed   By: Kathreen Meyer   On: 11/19/2018 13:38   Dg Chest Port 1 View  Result Date: 11/13/2018 CLINICAL DATA:  Tracheostomy placement.  COVID-19. EXAM: PORTABLE CHEST 1 VIEW COMPARISON:  11/11/2018. FINDINGS: Tracheostomy is been inserted. Tip lies 4.5 cm above carina. BILATERAL pulmonary opacities persist, LEFT greater than RIGHT, not significantly improved. Cardiomegaly. Feeding tube duodenum. IMPRESSION: Satisfactory tracheostomy placement. No significant improvement aeration. Electronically Signed   By: Staci Righter M.D.   On: 11/13/2018 16:16   Dg Chest Port 1 View  Result Date: 11/11/2018 CLINICAL DATA:  Follow-up aspiration EXAM: PORTABLE CHEST 1 VIEW COMPARISON:  11/10/2018 FINDINGS: Cardiac shadow is stable. Endotracheal tube and gastric catheter are noted in satisfactory position. Right-sided PICC line is noted at the cavoatrial junction. Patchy infiltrates are again seen bilaterally stable from the prior exam. No pneumothorax is noted. IMPRESSION: No change from the previous day. Electronically Signed   By: Inez Catalina M.D.   On: 11/11/2018 07:52   Dg Chest Port 1 View  Result Date: 11/10/2018 CLINICAL  DATA:  Intubation.  OG tube placement. EXAM: PORTABLE CHEST 1 VIEW COMPARISON:  November 09, 2018 FINDINGS: The ETT remains in good position, 2 cm above the carina. Bilateral pulmonary infiltrates remain, similar on the left and more prominent on  the right in the interval. The OG tube terminates below today's film. No other changes. IMPRESSION: Support apparatus as above. Bilateral pulmonary infiltrates are stable on the left and mildly more prominent on the right in the interval. Electronically Signed   By: Dorise Bullion III M.D   On: 11/10/2018 16:27   Dg Chest Port 1 View  Result Date: 11/09/2018 CLINICAL DATA:  54 year old female positive COVID-19 status post PICC placement. EXAM: PORTABLE CHEST 1 VIEW COMPARISON:  Chest radiograph dated 11/04/2018 FINDINGS: A right-sided PICC is noted with tip over the right subclavian vein. Recommend further advancing by an additional approximately 10 cm. Endotracheal tube remains above the carina in similar position. Enteric tube extends below the diaphragm likely within the stomach. Interval progression of bilateral confluent densities since the prior radiograph. No large pleural effusion. There is no pneumothorax. No acute osseous pathology. Stable cardiac silhouette. IMPRESSION: 1. Right-sided PICC with tip over the right subclavian vein. Recommend further advancing an additional 10 cm. 2. Interval progression of bilateral confluent densities. Electronically Signed   By: Anner Crete M.D.   On: 11/09/2018 03:25   Dg Chest Port 1 View  Result Date: 11/04/2018 CLINICAL DATA:  Acute respiratory failure EXAM: PORTABLE CHEST 1 VIEW COMPARISON:  10/31/2018 FINDINGS: Cardiac shadow is stable. Endotracheal tube, right-sided PICC line and gastric catheter are noted in satisfactory position. Multifocal infiltrates are identified bilaterally left greater than right stable from the prior exam. No bony abnormality is seen. IMPRESSION: Stable bilateral infiltrates Tubes and lines in satisfactory position. Electronically Signed   By: Inez Catalina M.D.   On: 11/04/2018 07:01   Dg Abd Portable 1v  Result Date: 11/29/2018 CLINICAL DATA:  Dysphagia EXAM: PORTABLE ABDOMEN - 1 VIEW COMPARISON:  Plain film of the abdomen  dated 11/21/2018. FINDINGS: Enteric tube appears adequately positioned in the stomach. Visualized bowel gas pattern is nonobstructive. No evidence of free intraperitoneal air. IMPRESSION: Enteric tube appears adequately positioned in the stomach. Electronically Signed   By: Franki Cabot M.D.   On: 11/29/2018 11:29   Dg Abd Portable 1v  Result Date: 11/21/2018 CLINICAL DATA:  NG tube placement. EXAM: PORTABLE ABDOMEN - 1 VIEW COMPARISON:  Plain films of the abdomen dated 11/20/2018 and 11/19/2018. FINDINGS: Weighted tip feeding tube now appears appropriately positioned with tip in the proximal duodenum. Visualized bowel gas pattern is nonobstructive. IMPRESSION: Weighted tip feeding tube now appears appropriately positioned with tip in the proximal duodenum. Electronically Signed   By: Franki Cabot M.D.   On: 11/21/2018 12:24   Dg Abd Portable 1v  Result Date: 11/20/2018 CLINICAL DATA:  Encounter for NG tube placement EXAM: PORTABLE ABDOMEN - 1 VIEW COMPARISON:  11/19/2018 FINDINGS: Feeding tube coils in the stomach with the tip in the fundus. Nonobstructive bowel gas pattern. No organomegaly or free air. IMPRESSION: Feeding tube coils in the stomach with the tip in the fundus. Electronically Signed   By: Rolm Baptise M.D.   On: 11/20/2018 19:57   Dg Abd Portable 1v  Result Date: 11/19/2018 CLINICAL DATA:  Vomiting. EXAM: PORTABLE ABDOMEN - 1 VIEW COMPARISON:  None. FINDINGS: A feeding tube coils in the stomach with the distal tip in the fundus. No evidence of bowel obstruction. IMPRESSION: A feeding tube coils  in the stomach. No evidence of bowel obstruction. No cause for vomiting noted. Electronically Signed   By: Dorise Bullion III M.D   On: 11/19/2018 16:43   [place] Ekg Site Rite  Result Date: 11/09/2018 If Site Rite image not attached, placement could not be confirmed due to current cardiac rhythm.

## 2018-12-01 ENCOUNTER — Inpatient Hospital Stay (HOSPITAL_COMMUNITY): Payer: HRSA Program

## 2018-12-01 DIAGNOSIS — J80 Acute respiratory distress syndrome: Secondary | ICD-10-CM

## 2018-12-01 LAB — CBC WITH DIFFERENTIAL/PLATELET
Abs Immature Granulocytes: 0.11 10*3/uL — ABNORMAL HIGH (ref 0.00–0.07)
Basophils Absolute: 0.1 10*3/uL (ref 0.0–0.1)
Basophils Relative: 1 %
Eosinophils Absolute: 0.5 10*3/uL (ref 0.0–0.5)
Eosinophils Relative: 4 %
HCT: 39.8 % (ref 36.0–46.0)
Hemoglobin: 11.9 g/dL — ABNORMAL LOW (ref 12.0–15.0)
Immature Granulocytes: 1 %
Lymphocytes Relative: 29 %
Lymphs Abs: 4.4 10*3/uL — ABNORMAL HIGH (ref 0.7–4.0)
MCH: 29 pg (ref 26.0–34.0)
MCHC: 29.9 g/dL — ABNORMAL LOW (ref 30.0–36.0)
MCV: 96.8 fL (ref 80.0–100.0)
Monocytes Absolute: 1.4 10*3/uL — ABNORMAL HIGH (ref 0.1–1.0)
Monocytes Relative: 9 %
Neutro Abs: 8.5 10*3/uL — ABNORMAL HIGH (ref 1.7–7.7)
Neutrophils Relative %: 56 %
Platelets: 345 10*3/uL (ref 150–400)
RBC: 4.11 MIL/uL (ref 3.87–5.11)
RDW: 17.6 % — ABNORMAL HIGH (ref 11.5–15.5)
WBC: 15.1 10*3/uL — ABNORMAL HIGH (ref 4.0–10.5)
nRBC: 0 % (ref 0.0–0.2)

## 2018-12-01 LAB — COMPREHENSIVE METABOLIC PANEL
ALT: 38 U/L (ref 0–44)
AST: 36 U/L (ref 15–41)
Albumin: 3 g/dL — ABNORMAL LOW (ref 3.5–5.0)
Alkaline Phosphatase: 127 U/L — ABNORMAL HIGH (ref 38–126)
Anion gap: 10 (ref 5–15)
BUN: 12 mg/dL (ref 6–20)
CO2: 31 mmol/L (ref 22–32)
Calcium: 9.3 mg/dL (ref 8.9–10.3)
Chloride: 97 mmol/L — ABNORMAL LOW (ref 98–111)
Creatinine, Ser: 0.41 mg/dL — ABNORMAL LOW (ref 0.44–1.00)
GFR calc Af Amer: >60 mL/min (ref >60)
GFR calc non Af Amer: >60 mL/min (ref >60)
Glucose, Bld: 117 mg/dL — ABNORMAL HIGH (ref 70–99)
Potassium: 3.8 mmol/L (ref 3.5–5.1)
Sodium: 138 mmol/L (ref 135–145)
Total Bilirubin: 0.4 mg/dL (ref 0.3–1.2)
Total Protein: 6.6 g/dL (ref 6.5–8.1)

## 2018-12-01 LAB — GLUCOSE, CAPILLARY
Glucose-Capillary: 120 mg/dL — ABNORMAL HIGH (ref 70–99)
Glucose-Capillary: 137 mg/dL — ABNORMAL HIGH (ref 70–99)
Glucose-Capillary: 104 mg/dL — ABNORMAL HIGH (ref 70–99)
Glucose-Capillary: 126 mg/dL — ABNORMAL HIGH (ref 70–99)
Glucose-Capillary: 105 mg/dL — ABNORMAL HIGH (ref 70–99)
Glucose-Capillary: 107 mg/dL — ABNORMAL HIGH (ref 70–99)

## 2018-12-01 LAB — MAGNESIUM: Magnesium: 2.2 mg/dL (ref 1.7–2.4)

## 2018-12-01 MED ORDER — RESOURCE THICKENUP CLEAR PO POWD
ORAL | Status: DC | PRN
  Filled 2018-12-01: qty 125

## 2018-12-01 MED ORDER — METOPROLOL TARTRATE 50 MG PO TABS
50.0000 mg | ORAL_TABLET | Freq: Two times a day (BID) | ORAL | Status: DC
  Administered 2018-12-01 – 2018-12-09 (×15): 50 mg via ORAL
  Filled 2018-12-01 (×9): qty 2
  Filled 2018-12-01: qty 1
  Filled 2018-12-01 (×6): qty 2

## 2018-12-01 NOTE — Progress Notes (Signed)
PROGRESS NOTE                                                                                                                                                                                                             Patient Demographics:    Shannon Meyer, is a 53 y.o. female, DOB - 1966-02-11, NLG:[id number]  Outpatient Primary MD for the patient is No primary care provider on file.    LOS - 43  Admit date - 10/22/2018    Chief Complaint  Patient presents with   Weakness       Brief Narrative  Asenath Balash is a 53 y.o. female with no significant medical history who presented with cough, weakness and shortness of breath found to have covid-19 with bilateral CXR opacities, intubated in ED and admitted to [facility] 5/20. She has had a prolonged hospital course. She was unable to wean from ventilator and underwent tracheostomy. She began developing fevers and found to have pseudomonas in her sputum. She is currently on antibiotics. Still has significant leukocytosis that is being monitored. Weaning off oxygen. Plan is for CIR/SNF/LTAC once stable.   Subjective:   Patient in bed, appears comfortable, denies any headache, no fever, no chest pain or pressure, no shortness of breath , no abdominal pain. No focal weakness.    Assessment  & Plan :     1. Acute Hypoxic Resp. Failure due to Acute Covid 19 Viral Pneumonitis during the ongoing 2020 Covid 19 Pandemic - she has completed remdesivir 5/21 - 5/30, received actemra 5/20, and 5/21. Received CTX 5/20-5/26, azithromycin 5/20-5/24. Needed to be intubated subsequently trached after she failed extubation on 11/10/2018, currently on trach collar.  Requiring 4 L oxygen per minute.  Continue supportive care. Will advance activity, PT, Med surg transfer.   ABG     Component Value Date/Time   PHART 7.424 11/22/2018 2116   PCO2ART 41.5 11/22/2018 2116   PO2ART 65.0 (L) 11/22/2018 2116   HCO3 27.1 11/22/2018 2116   TCO2 28 11/22/2018 2116   ACIDBASEDEF 1.0 10/24/2018 0419   O2SAT 92.0 11/22/2018 2116    COVID-19 Labs  No results for input(s): DDIMER, FERRITIN, LDH, CRP in the last 72 hours.  Lab Results  Component Value Date   SARSCOV2NAA POSITIVE (A) 10/22/2018     Hepatic Function Latest Ref Rng & Units  12/01/2018 11/30/2018 11/29/2018  Total Protein 6.5 - 8.1 g/dL 6.6 6.5 6.6  Albumin 3.5 - 5.0 g/dL 3.0(L) 2.9(L) 2.9(L)  AST 15 - 41 U/L 36 57(H) 36  ALT 0 - 44 U/L 38 38 38  Alk Phosphatase 38 - 126 U/L 127(H) 126 131(H)  Total Bilirubin 0.3 - 1.2 mg/dL 0.4 0.7 0.1(L)        Component Value Date/Time   BNP 89.5 11/28/2018 0300      2.  Pseudomonas pneumonia -  Currently on Fortaz and clinically improving.  Continue for now.  Leukocytosis has improved and she is currently afebrile. Finished 7 days of treatment on 11/27/2018.  3.  ICU delirium, toxic encephalopathy.  Minimize sedatives and narcotics, Seroquel nightly, sitter if needed, restraints only if becomes danger to self or others by pulling lines or tubes.  Expose to sunlight, up in chair, PT. Much improved.  4.  Sinus tachycardia with questionable brief run of A. fib for a few minutes morning of 11/26/2018.  This would be a single isolated episode, no previous echo on file, she had stable TSH, currently on beta-blocker, unremarkable EKG, will place her on aspirin 81 mg, case discussed with cardiologist Dr. Marlou Porch who recommends no further change in treatment or plan, outpatient echocardiogram and cardiology follow-up.  Mali vas 2 score will be 3 .  5.  Hypokalemia.  Replaced and stable.  6. 1 Out of 4 blood culture contamination.  Stable monitor.  7.  Obesity with a BMI of 34.  Outpatient follow-up with PCP.  8.  Tongue laceration which was noted by nursing staff several days ago, likely while she was intubated.  Healing well.  Supportive care.  Outpatient ENT follow-up if needed.  Speech following.     9.  Tracheostomy induced dysphagia requiring tube feeds. Currently on PS MV valve, speech following due for modified barium swallow on 12/01/2018, case also discussed with PCCM, good chance that patient will be decannulated this admission.    Condition - Fair  Family Communication  : daughter Shannon Meyer called 11/28/18 @ 9.30 am   Code Status : Full code  Diet : Tube feeds  Diet Order            Diet NPO time specified Except for: Ice Chips  Diet effective midnight               Disposition - Med  Consults  :  PCCM  Procedures  :   5/20 Intubated in ED and admitted to [facility] ICU 5/21-5/26 Weaning FIO2 and PEEP, diuresing 5/26-6/3 Tolerating weaning for 1-2 hours 6/7 weaning on pressure support ventilation again this morning 10/5 6/8 extubated, reintubated (tachypnea, tachycardia) 6/12 Trached 6/13 Trach collar all day  6/14 bleeding from tracheostomy site 6/17 48 hours off vent, fever > pseudomonas pneumonia 6/20 slightly more awake, remains off vent 6/26 mentation much improved moved out of ICU 6/29 modified barium swallow due  PUD Prophylaxis : Famotidine   DVT Prophylaxis  :    Heparin    Lab Results  Component Value Date   PLT 345 12/01/2018    Inpatient Medications  Scheduled Meds:  aspirin  150 mg Rectal Daily   chlorhexidine  15 mL Mouth/Throat BID   Chlorhexidine Gluconate Cloth  6 each Topical Daily   free water  200 mL Per Tube Q8H   heparin injection (subcutaneous)  7,500 Units Subcutaneous Q8H   insulin aspart  0-9 Units Subcutaneous Q4H   mouth rinse  15 mL Mouth Rinse QID   metoprolol tartrate  50 mg Per Tube BID   multivitamin  15 mL Per Tube Daily   pantoprazole (PROTONIX) IV  40 mg Intravenous QHS   sodium chloride flush  10-40 mL Intracatheter Q12H   Continuous Infusions:  dextrose 75 mL/hr at 12/01/18 0555   feeding supplement (VITAL AF 1.2 CAL) Stopped (11/29/18 0545)   PRN Meds:.acetaminophen,  hydrALAZINE, ipratropium-albuterol, metoprolol tartrate, ondansetron (ZOFRAN) IV, polyethylene glycol, QUEtiapine, sennosides  Antibiotics  :    Anti-infectives (From admission, onward)   Start     Dose/Rate Route Frequency Ordered Stop   11/21/18 2200  cefTAZidime (FORTAZ) 2 g in sodium chloride 0.9 % 100 mL IVPB     2 g 200 mL/hr over 30 Minutes Intravenous Every 8 hours 11/21/18 1426 11/28/18 0656   11/21/18 1400  cefTAZidime (FORTAZ) 2 g in sodium chloride 0.9 % 100 mL IVPB  Status:  Discontinued     2 g 200 mL/hr over 30 Minutes Intravenous Every 8 hours 11/21/18 1330 11/21/18 1426   11/20/18 0400  vancomycin (VANCOCIN) IVPB 1000 mg/200 mL premix  Status:  Discontinued     1,000 mg 200 mL/hr over 60 Minutes Intravenous Every 12 hours 11/19/18 1517 11/20/18 1344   11/19/18 1530  vancomycin (VANCOCIN) 1,500 mg in sodium chloride 0.9 % 500 mL IVPB     1,500 mg 250 mL/hr over 120 Minutes Intravenous  Once 11/19/18 1517 11/19/18 1744   11/19/18 1530  piperacillin-tazobactam (ZOSYN) IVPB 3.375 g     3.375 g 12.5 mL/hr over 240 Minutes Intravenous Every 8 hours 11/19/18 1517 11/21/18 1734   11/01/18 1000  fluconazole (DIFLUCAN) IVPB 100 mg     100 mg 50 mL/hr over 60 Minutes Intravenous Every 24 hours 11/01/18 0857 11/03/18 1137   10/27/18 2200  cefTRIAXone (ROCEPHIN) 2 g in sodium chloride 0.9 % 100 mL IVPB     2 g 200 mL/hr over 30 Minutes Intravenous Every 24 hours 10/27/18 0835 10/28/18 2243   10/24/18 1400  remdesivir 100 mg in sodium chloride 0.9 % 250 mL IVPB  Status:  Discontinued     100 mg over 30 Minutes Intravenous Every 24 hours 10/23/18 1153 10/24/18 1335   10/24/18 1400  remdesivir 100 mg in sodium chloride 0.9 % 220 mL IVPB  Status:  Discontinued     100 mg over 30 Minutes Intravenous Every 24 hours 10/24/18 1334 10/24/18 1336   10/24/18 1400  remdesivir 100 mg in sodium chloride 0.9 % 230 mL IVPB     100 mg over 30 Minutes Intravenous Every 24 hours 10/24/18 1335  11/01/18 1500   10/24/18 1400  remdesivir 100 mg in sodium chloride 0.9 % 230 mL IVPB  Status:  Discontinued     100 mg over 30 Minutes Intravenous Every 24 hours 10/24/18 1337 10/24/18 1339   10/23/18 1400  remdesivir 200 mg in sodium chloride 0.9 % 250 mL IVPB     200 mg over 30 Minutes Intravenous Once 10/23/18 1153 10/23/18 1738   10/22/18 2200  azithromycin (ZITHROMAX) 500 mg in sodium chloride 0.9 % 250 mL IVPB     500 mg 250 mL/hr over 60 Minutes Intravenous Every 24 hours 10/22/18 2148 10/26/18 2255   10/22/18 2200  cefTRIAXone (ROCEPHIN) 1 g in sodium chloride 0.9 % 100 mL IVPB  Status:  Discontinued     1 g 200 mL/hr over 30 Minutes Intravenous Every 24 hours 10/22/18 2148 10/27/18 0835  Time Spent in minutes  30   Lala Lund M.D on 12/01/2018 at 10:01 AM  To page go to [email or web address] - password [facility]  [facility] -  Office  [phone]     See all Orders from today for further details    Objective:   Vitals:   12/01/18 0426 12/01/18 0600 12/01/18 0800 12/01/18 0833  BP: 118/72   140/88  Pulse: 98 (!) 106 (!) 102   Resp: (!) 26 (!) 27 (!) 33 (!) 28  Temp:    98 F (36.7 C)  TempSrc:    Oral  SpO2: 100% 100% 93% 100%  Weight:      Height:        Wt Readings from Last 3 Encounters:  12/01/18 89 kg     Intake/Output Summary (Last 24 hours) at 12/01/2018 1001 Last data filed at 12/01/2018 0644 Gross per 24 hour  Intake 1309.34 ml  Output 950 ml  Net 359.34 ml     Physical Exam  Awake, minimally confused, trach collar in place, NG tube in place, pubic catheter along with right arm PICC line in place,  Awake Alert, [place].AT,PERRAL Supple Neck,No JVD, No cervical lymphadenopathy appriciated.  Symmetrical Chest wall movement, Good air movement bilaterally, CTAB RRR,No Gallops, Rubs or new Murmurs, No Parasternal Heave +ve B.Sounds, Abd Soft, No tenderness, No organomegaly appriciated, No rebound - guarding or rigidity. No Cyanosis, Clubbing  or edema, No new Rash or bruise     Data Review:    CBC Recent Labs  Lab 11/27/18 0608 11/28/18 0300 11/29/18 0325 11/30/18 0645 12/01/18 0700  WBC 12.5* 14.3* 15.1* 16.9* 15.1*  HGB 10.3* 11.3* 11.6* 11.8* 11.9*  HCT 35.4* 37.5 39.6 40.0 39.8  PLT 315 344 398 347 345  MCV 98.3 98.2 98.8 96.9 96.8  MCH 28.6 29.6 28.9 28.6 29.0  MCHC 29.1* 30.1 29.3* 29.5* 29.9*  RDW 18.2* 18.3* 18.1* 17.8* 17.6*  LYMPHSABS 3.0 4.0 3.6 4.1* 4.4*  MONOABS 1.3* 1.7* 1.3* 1.5* 1.4*  EOSABS 0.9* 1.2* 0.8* 0.6* 0.5  BASOSABS 0.1 0.1 0.1 0.1 0.1    Chemistries  Recent Labs  Lab 11/27/18 0608 11/28/18 0300 11/29/18 0325 11/30/18 0645 12/01/18 0700  NA 141 142 146* 137 138  K 3.8 3.7 3.8 4.7 3.8  CL 105 106 97* 97* 97*  CO2 '29 27 31 26 31  ' GLUCOSE 117* 95 108* 92 117*  BUN 25* 28* 22* 21* 12  CREATININE 0.48 0.57 0.45 0.54 0.41*  CALCIUM 8.8* 8.5* 9.6 8.8* 9.3  MG 2.3 2.2 2.4 2.6* 2.2  AST 27 26 36 57* 36  ALT 34 30 38 38 38  ALKPHOS 122 114 131* 126 127*  BILITOT 0.3 <0.1* 0.1* 0.7 0.4   ------------------------------------------------------------------------------------------------------------------ No results for input(s): CHOL, HDL, LDLCALC, TRIG, CHOLHDL, LDLDIRECT in the last 72 hours.  No results found for: HGBA1C ------------------------------------------------------------------------------------------------------------------ No results for input(s): TSH, T4TOTAL, T3FREE, THYROIDAB in the last 72 hours.  Invalid input(s): FREET3  Lab Results  Component Value Date   TSH 1.034 11/19/2018    Cardiac Enzymes No results for input(s): CKMB, TROPONINI, MYOGLOBIN in the last 168 hours.  Invalid input(s): CK ------------------------------------------------------------------------------------------------------------------    Component Value Date/Time   BNP 89.5 11/28/2018 0300    Micro Results Recent Results (from the past 240 hour(s))  MRSA PCR Screening     Status:  None   Collection Time: 11/28/18  2:00 PM   Specimen: Nasal Mucosa; Nasopharyngeal  Result Value Ref Range Status  MRSA by PCR NEGATIVE NEGATIVE Final    Comment:        The GeneXpert MRSA Assay (FDA approved for NASAL specimens only), is one component of a comprehensive MRSA colonization surveillance program. It is not intended to diagnose MRSA infection nor to guide or monitor treatment for MRSA infections. Performed at [facility] [place] [address] [place], [place] [address]     Radiology Reports Dg Abd 1 View  Result Date: 11/10/2018 CLINICAL DATA:  Orogastric placement EXAM: ABDOMEN - 1 VIEW COMPARISON:  None. FINDINGS: Orogastric tube enters the stomach, loops in the body and has its tip in the antrum. Bowel gas pattern appears normal. IMPRESSION: Orogastric tip in the antrum. Electronically Signed   By: Nelson Chimes M.D.   On: 11/10/2018 16:50   Dg Chest Port 1 View  Result Date: 11/25/2018 CLINICAL DATA:  ARDS, COVID-19 positive EXAM: PORTABLE CHEST 1 VIEW COMPARISON:  Portable exam 0508 hours compared to 11/19/2018 FINDINGS: Tracheostomy tube unchanged. Tip of RIGHT arm PICC line projects over cavoatrial junction. Feeding tube extends to at least the second portion of the duodenum. Stable heart size. Diffuse BILATERAL airspace infiltrates, unchanged. No pleural effusion or pneumothorax. IMPRESSION: Persistent diffuse BILATERAL airspace infiltrates Electronically Signed   By: Lavonia Dana M.D.   On: 11/25/2018 08:08   Dg Chest Port 1 View  Result Date: 11/19/2018 CLINICAL DATA:  Fever EXAM: PORTABLE CHEST 1 VIEW COMPARISON:  11/13/2018, 11/11/2018 FINDINGS: Tracheostomy tube in satisfactory position. Feeding tube coiled in the stomach. Right-sided PICC line with the tip projecting over the SVC. Worsening bilateral patchy interstitial and alveolar airspace disease throughout bilateral lungs concerning for multilobar pneumonia. No pleural effusion or  pneumothorax. Stable cardiomediastinal silhouette. No aggressive osseous lesion. IMPRESSION: 1. Support lines and tubing in satisfactory position. 2. Worsening bilateral patchy interstitial and alveolar airspace disease throughout bilateral lungs concerning for multilobar pneumonia. Electronically Signed   By: Kathreen Meyer   On: 11/19/2018 13:38   Dg Chest Port 1 View  Result Date: 11/13/2018 CLINICAL DATA:  Tracheostomy placement.  COVID-19. EXAM: PORTABLE CHEST 1 VIEW COMPARISON:  11/11/2018. FINDINGS: Tracheostomy is been inserted. Tip lies 4.5 cm above carina. BILATERAL pulmonary opacities persist, LEFT greater than RIGHT, not significantly improved. Cardiomegaly. Feeding tube duodenum. IMPRESSION: Satisfactory tracheostomy placement. No significant improvement aeration. Electronically Signed   By: Staci Righter M.D.   On: 11/13/2018 16:16   Dg Chest Port 1 View  Result Date: 11/11/2018 CLINICAL DATA:  Follow-up aspiration EXAM: PORTABLE CHEST 1 VIEW COMPARISON:  11/10/2018 FINDINGS: Cardiac shadow is stable. Endotracheal tube and gastric catheter are noted in satisfactory position. Right-sided PICC line is noted at the cavoatrial junction. Patchy infiltrates are again seen bilaterally stable from the prior exam. No pneumothorax is noted. IMPRESSION: No change from the previous day. Electronically Signed   By: Inez Catalina M.D.   On: 11/11/2018 07:52   Dg Chest Port 1 View  Result Date: 11/10/2018 CLINICAL DATA:  Intubation.  OG tube placement. EXAM: PORTABLE CHEST 1 VIEW COMPARISON:  November 09, 2018 FINDINGS: The ETT remains in good position, 2 cm above the carina. Bilateral pulmonary infiltrates remain, similar on the left and more prominent on the right in the interval. The OG tube terminates below today's film. No other changes. IMPRESSION: Support apparatus as above. Bilateral pulmonary infiltrates are stable on the left and mildly more prominent on the right in the interval. Electronically Signed    By: Dorise Bullion III M.D  On: 11/10/2018 16:27   Dg Chest Port 1 View  Result Date: 11/09/2018 CLINICAL DATA:  53 year old female positive COVID-19 status post PICC placement. EXAM: PORTABLE CHEST 1 VIEW COMPARISON:  Chest radiograph dated 11/04/2018 FINDINGS: A right-sided PICC is noted with tip over the right subclavian vein. Recommend further advancing by an additional approximately 10 cm. Endotracheal tube remains above the carina in similar position. Enteric tube extends below the diaphragm likely within the stomach. Interval progression of bilateral confluent densities since the prior radiograph. No large pleural effusion. There is no pneumothorax. No acute osseous pathology. Stable cardiac silhouette. IMPRESSION: 1. Right-sided PICC with tip over the right subclavian vein. Recommend further advancing an additional 10 cm. 2. Interval progression of bilateral confluent densities. Electronically Signed   By: Anner Crete M.D.   On: 11/09/2018 03:25   Dg Chest Port 1 View  Result Date: 11/04/2018 CLINICAL DATA:  Acute respiratory failure EXAM: PORTABLE CHEST 1 VIEW COMPARISON:  10/31/2018 FINDINGS: Cardiac shadow is stable. Endotracheal tube, right-sided PICC line and gastric catheter are noted in satisfactory position. Multifocal infiltrates are identified bilaterally left greater than right stable from the prior exam. No bony abnormality is seen. IMPRESSION: Stable bilateral infiltrates Tubes and lines in satisfactory position. Electronically Signed   By: Inez Catalina M.D.   On: 11/04/2018 07:01   Dg Abd Portable 1v  Result Date: 11/29/2018 CLINICAL DATA:  Dysphagia EXAM: PORTABLE ABDOMEN - 1 VIEW COMPARISON:  Plain film of the abdomen dated 11/21/2018. FINDINGS: Enteric tube appears adequately positioned in the stomach. Visualized bowel gas pattern is nonobstructive. No evidence of free intraperitoneal air. IMPRESSION: Enteric tube appears adequately positioned in the stomach. Electronically  Signed   By: Franki Cabot M.D.   On: 11/29/2018 11:29   Dg Abd Portable 1v  Result Date: 11/21/2018 CLINICAL DATA:  NG tube placement. EXAM: PORTABLE ABDOMEN - 1 VIEW COMPARISON:  Plain films of the abdomen dated 11/20/2018 and 11/19/2018. FINDINGS: Weighted tip feeding tube now appears appropriately positioned with tip in the proximal duodenum. Visualized bowel gas pattern is nonobstructive. IMPRESSION: Weighted tip feeding tube now appears appropriately positioned with tip in the proximal duodenum. Electronically Signed   By: Franki Cabot M.D.   On: 11/21/2018 12:24   Dg Abd Portable 1v  Result Date: 11/20/2018 CLINICAL DATA:  Encounter for NG tube placement EXAM: PORTABLE ABDOMEN - 1 VIEW COMPARISON:  11/19/2018 FINDINGS: Feeding tube coils in the stomach with the tip in the fundus. Nonobstructive bowel gas pattern. No organomegaly or free air. IMPRESSION: Feeding tube coils in the stomach with the tip in the fundus. Electronically Signed   By: Rolm Baptise M.D.   On: 11/20/2018 19:57   Dg Abd Portable 1v  Result Date: 11/19/2018 CLINICAL DATA:  Vomiting. EXAM: PORTABLE ABDOMEN - 1 VIEW COMPARISON:  None. FINDINGS: A feeding tube coils in the stomach with the distal tip in the fundus. No evidence of bowel obstruction. IMPRESSION: A feeding tube coils in the stomach. No evidence of bowel obstruction. No cause for vomiting noted. Electronically Signed   By: Dorise Bullion III M.D   On: 11/19/2018 16:43   [place] Ekg Site Rite  Result Date: 11/09/2018 If Site Rite image not attached, placement could not be confirmed due to current cardiac rhythm.

## 2018-12-01 NOTE — Progress Notes (Addendum)
Metoprolol IV given per PRN order. Patient's HR sustained 120's-130's for an hour prior to administration. Patient sleeping throughout this time. No distress noted. Will continue to monitor.

## 2018-12-01 NOTE — Progress Notes (Signed)
Planning on MBS today to determine if pt can initiate a diet. Radiology is expecting her at 11pm. Discussed with RN.  Herbie Baltimore, [facility]  Acute Rehabilitation Services Pager [phone] Office [phone]

## 2018-12-01 NOTE — Progress Notes (Signed)
  Speech Language Pathology Treatment: Dysphagia  Patient Details Name: Shannon Meyer MRN: [id number] DOB: [birth year] Today's Date: 12/01/2018 Time: 0973-5329 SLP Time Calculation (min) (ACUTE ONLY): 15 min  Assessment / Plan / Recommendation Clinical Impression  Pt seen with lunch tray. Demonstrates tolerance of soft solids and nectar thick liquids with self feeding and PMSV in place. Mastication adequate. Pts rate is safe. Pt also noted to be wearing PMSV with minimal staff supervision all day without difficulty. One instance of desaturation observed after MBS. PMSV not removed but O@ via [place] increased by 1 liter with return to baseline. Pt may continue to use PMSV witn intermittent supervision to also allow for independent PO intake. RN to reinforce precautions and ensure the valve is off for sleeping.   HPI HPI: Pt adm 5/20 with hypoxic resp failure due to Covid 19. Pt intubated 5/20 and extubated 6/8, with re-intubation 6/8; Trach 6/11. PMH - none.       SLP Plan  Continue with current plan of care       Recommendations  Diet recommendations: Dysphagia 3 (mechanical soft);Nectar-thick liquid Liquids provided via: Cup Medication Administration: Crushed with puree Supervision: Patient able to self feed Compensations: Slow rate;Small sips/bites;Follow solids with liquid;Multiple dry swallows after each bite/sip Postural Changes and/or Swallow Maneuvers: Seated upright 90 degrees      Patient may use Passy-Muir Speech Valve: During PO intake/meals;During all waking hours (remove during sleep) PMSV Supervision: Intermittent MD: Please consider changing trach tube to : Smaller size;Cuffless         Follow up Recommendations: Inpatient Rehab SLP Visit Diagnosis: Dysphagia, oropharyngeal phase (R13.12) Plan: Continue with current plan of care       GO               Herbie Baltimore, MA [place] Pager [phone] Office [phone]  [name] 12/01/2018, 1:32 PM

## 2018-12-01 NOTE — Progress Notes (Signed)
NAME:  Shannon Meyer, MRN:  [id number], DOB:  1966-03-19, LOS: 31 ADMISSION DATE:  10/22/2018, CONSULTATION DATE:  10/22/2018 REFERRING MD:  Sedonia Small, CHIEF COMPLAINT:  Dyspnea   Brief History   53 year old female with no past medical history admitted from [place] on Oct 22, 2018 due to ARDS from COVID-19 pneumonia.  Has had a prolonged hospitalization, required tracheostomy.  Liberated from the ventilator on 6/15.   Past Medical History  none  Significant Hospital Events   5/20 Intubated in ED and admitted to [facility] ICU 5/21-5/26 Weaning FIO2 and PEEP, diuresing 5/26-6/3 Tolerating weaning for 1-2 hours 6/7 weaning on pressure support ventilation again this morning 10/5 6/8 extubated, reintubated (tachypnea, tachycardia) 6/12 Trached 6/13 Trach collar all day  6/14 bleeding from tracheostomy site 6/17 48 hours off vent, fever > pseudomonas pneumonia 6/20 slightly more awake, remains off vent 6/26 transferred to med-surg floor  Consults:  PCCM  Procedures:  May 20 endotracheal tube> June 11 Tracheostomy June 11>  June 7 PICC line right arm>   Significant Diagnostic Tests:  Oct 28, 2018 CT angiogram chest no pulmonary embolism, bilateral atypical pneumonia consistent with COVID-19 CXR 6/23 - improving infiltrates  Micro Data:  May 20 SARS-CoV-2 positive May 20 blood culture> 1 out of 2 GPC coag negative staph June 17 respiratory culture> pseudomonas aeruginosa  Antimicrobials:  Zithromax 5/20 >>5/24 Rocephin 5/20 >>5/26 Remdesivir 5/21 >5/30 Actemra 5/20>5/21 Solumedrol 5/12 >5/26  Vanc 6/17 > x1 Zosyn 6/17 > 6/19 Ceftaz 6/17 > 6/25  Interim history/subjective:   Pulled out NG Swallow pills yesterday Breathing is comfortable Has not participated in physical therapy in 4 days  Objective   Blood pressure 140/88, pulse (!) 102, temperature 98 F (36.7 C), temperature source Oral, resp. rate (!) 28, height 5\' 4"  (1.626 m), weight  89 kg, SpO2 100 %.        Intake/Output Summary (Last 24 hours) at 12/01/2018 1020 Last data filed at 12/01/2018 0644 Gross per 24 hour  Intake 1309.34 ml  Output 950 ml  Net 359.34 ml   Filed Weights   11/30/18 0428 11/30/18 0530 12/01/18 0410  Weight: 87.5 kg 87.9 kg 89 kg    Examination: General:  Resting comfortably in bed HENT: NCAT tracheostomy site clean, dry, intact PULM: CTA B, normal effort CV: RRR, no mgr GI: BS+, soft, nontender MSK: normal bulk and tone Neuro: awake, alert, no distress, MAEW    Resolved Hospital Problem list     Assessment & Plan:  ARDS due to COVID-19 pneumonia: Resolving Continue to wean off oxygen  Tracheostomy status: Continue trach care per routine Continue speaking valve Speech evaluation for swallowing today I could consider decannulation this week if she is able to swallow, sit up in a chair, perhaps take a few steps.  I have strongly encouraged her to participate in physical therapy this week.  Pseudomonas pneumonia: Monitor off of ceftaz   Best practice:  Diet: advance Pain/Anxiety/Delirium protocol (if indicated): n/a VAP protocol (if indicated): n/a DVT prophylaxis: sub q hep GI prophylaxis: n/a Glucose control: per TRH Mobility: out of bed, PT consult Code Status: full Family Communication:  Per TRH Disposition: consider [facility]  Labs   CBC: Recent Labs  Lab 11/27/18 0608 11/28/18 0300 11/29/18 0325 11/30/18 0645 12/01/18 0700  WBC 12.5* 14.3* 15.1* 16.9* 15.1*  NEUTROABS 7.1 7.2 9.2* 10.4* 8.5*  HGB 10.3* 11.3* 11.6* 11.8* 11.9*  HCT 35.4* 37.5 39.6 40.0 39.8  MCV 98.3  98.2 98.8 96.9 96.8  PLT 315 344 398 347 345    Basic Metabolic Panel: Recent Labs  Lab 11/27/18 0608 11/28/18 0300 11/29/18 0325 11/30/18 0645 12/01/18 0700  NA 141 142 146* 137 138  K 3.8 3.7 3.8 4.7 3.8  CL 105 106 97* 97* 97*  CO2 29 27 31 26 31   GLUCOSE 117* 95 108* 92 117*  BUN 25* 28* 22* 21* 12   CREATININE 0.48 0.57 0.45 0.54 0.41*  CALCIUM 8.8* 8.5* 9.6 8.8* 9.3  MG 2.3 2.2 2.4 2.6* 2.2   GFR: Estimated Creatinine Clearance: 85.8 mL/min (A) (by C-G formula based on SCr of 0.41 mg/dL (L)). Recent Labs  Lab 11/28/18 0300 11/29/18 0325 11/30/18 0645 12/01/18 0700  WBC 14.3* 15.1* 16.9* 15.1*    Liver Function Tests: Recent Labs  Lab 11/27/18 0608 11/28/18 0300 11/29/18 0325 11/30/18 0645 12/01/18 0700  AST 27 26 36 57* 36  ALT 34 30 38 38 38  ALKPHOS 122 114 131* 126 127*  BILITOT 0.3 <0.1* 0.1* 0.7 0.4  PROT 6.1* 5.8* 6.6 6.5 6.6  ALBUMIN 2.7* 2.6* 2.9* 2.9* 3.0*   No results for input(s): LIPASE, AMYLASE in the last 168 hours. No results for input(s): AMMONIA in the last 168 hours.  ABG    Component Value Date/Time   PHART 7.424 11/22/2018 2116   PCO2ART 41.5 11/22/2018 2116   PO2ART 65.0 (L) 11/22/2018 2116   HCO3 27.1 11/22/2018 2116   TCO2 28 11/22/2018 2116   ACIDBASEDEF 1.0 10/24/2018 0419   O2SAT 92.0 11/22/2018 2116     Coagulation Profile: No results for input(s): INR, PROTIME in the last 168 hours.  Cardiac Enzymes: No results for input(s): CKTOTAL, CKMB, CKMBINDEX, TROPONINI in the last 168 hours.  HbA1C: No results found for: HGBA1C  CBG: Recent Labs  Lab 11/30/18 1629 11/30/18 1958 11/30/18 2332 12/01/18 0407 12/01/18 0831  GLUCAP 115* 119* 113* 137* 104*     Critical care time: n/a    Heber CarolinaBrent McQuaid, MD [place] [facility] Pager: [phone] Cell: [phone] If no response, call [phone]

## 2018-12-01 NOTE — Progress Notes (Signed)
Occupational Therapy Treatment Patient Details Name: Shannon Meyer MRN: [id number] DOB: [birth year] Today's Date: 12/01/2018    History of present illness Pt adm 5/20 with hypoxic resp failure due to Covid 19. Pt intubated 5/20 and extubated 6/8, with re-intubation 6/8; Trach 6/11. PMH - none. 6/17 foul smelling trach site/secretions, elevated WBC and temp; vomited tube feeds.   OT comments  Pt making good progress, however states she is tired from [facility] today. Attempted to mobilize out of bed to chair however, pt orthostatic 149/86 supine; 120/86 sitting) and became less responsive therefore returned to supine. SpO2 93 on 2L throughout session; HR 118; RR 26. Completed BUE strengthening with @ 3+/5 strength throughout with LUE minimally stronger than RUE. Pt is impulsive and demonstrates apparent cognitive deficits as noted below. Continue to recommend CIR for rehab. Will continue to follow acutely.   Follow Up Recommendations  CIR;Supervision/Assistance - 24 hour    Equipment Recommendations  3 in 1 bedside commode;Other (comment)    Recommendations for Other Services Rehab consult    Precautions / Restrictions Precautions Precautions: Fall Precaution Comments: monitor vitals; impulsive       Mobility Bed Mobility Overal bed mobility: Needs Assistance Bed Mobility: Supine to Sit     Supine to sit: Min assist Sit to supine: Mod assist      Transfers                      Balance Overall balance assessment: Needs assistance   Sitting balance-[name] Scale: Fair                                     ADL either performed or assessed with clinical judgement   ADL Overall ADL's : Needs assistance/impaired Eating/Feeding: Minimal assistance;Sitting;Supervision/ safety Eating/Feeding Details (indicate cue type and reason): nectar thick Grooming: Minimal assistance;Oral care Grooming Details (indicate cue type and reason): A to comb hair due to UE weakness;  Difficulty problem solving with how to put toothpaste on toothbrush Upper Body Bathing: Moderate assistance;Bed level   Lower Body Bathing: Maximal assistance;Bed level                       Functional mobility during ADLs: Moderate assistance(to EOB)       Vision       Perception     Praxis      Cognition Arousal/Alertness: Awake/alert Behavior During Therapy: Impulsive;Anxious Overall Cognitive Status: Impaired/Different from baseline Area of Impairment: Orientation;Attention;Memory;Safety/judgement;Awareness;Problem solving                 Orientation Level: Disoriented to Current Attention Level: Sustained;Selective(moments of selective) Memory: Decreased short-term memory Following Commands: Follows one step commands consistently Safety/Judgement: Decreased awareness of safety;Decreased awareness of deficits Awareness: Emergent Problem Solving: Slow processing;Decreased initiation;Difficulty sequencing;Requires verbal cues General Comments: "I've done too much today". Appears anxious during session ; Does not remember grand daughters name; Internally distracted; Sat EOB and became dizzy and reclined without warning; received 2 phone calls and was unable to remember to hang up phone x 2 - given vc        Exercises General Exercises - Upper Extremity Shoulder Flexion: AROM;Strengthening;Both;10 reps;Seated Shoulder ABduction: AROM;Strengthening;Both;10 reps;Seated Elbow Flexion: AROM;Strengthening;Both;10 reps;Seated Elbow Extension: AROM;Strengthening;Both;10 reps;Seated   Shoulder Instructions       General Comments      Pertinent Vitals/ Pain  Home Living                                          Prior Functioning/Environment              Frequency  Min 3X/week        Progress Toward Goals  OT Goals(current goals can now be found in the care plan section)  Progress towards OT goals: Progressing toward  goals  Acute Rehab OT Goals Patient Stated Goal: to get better and see her grand daughter OT Goal Formulation: With patient/family Time For Goal Achievement: 12/01/18 Potential to Achieve Goals: Good ADL Goals Pt Will Perform Grooming: with min assist;sitting Pt Will Transfer to Toilet: stand pivot transfer;bedside commode;with mod assist Pt Will Perform Toileting - Clothing Manipulation and hygiene: with mod assist;sit to/from stand;sitting/lateral leans Additional ADL Goal #1: Pt will perform bed mobility with Min A in preparation for ADLs Additional ADL Goal #2: Pt will tolerate sitting at EOB for 10 minutes with Min Guard A in preparation for ADLs Additional ADL Goal #3: Pt will demonstrate selective attention during ADL in a distracting environement with Min cues  Plan Discharge plan remains appropriate    Co-evaluation                 AM-PAC OT "6 Clicks" Daily Activity     Outcome Measure   Help from another person eating meals?: A Little Help from another person taking care of personal grooming?: A Lot Help from another person toileting, which includes using toliet, bedpan, or urinal?: A Lot Help from another person bathing (including washing, rinsing, drying)?: A Lot Help from another person to put on and taking off regular upper body clothing?: A Lot Help from another person to put on and taking off regular lower body clothing?: A Lot 6 Click Score: 13    End of Session Equipment Utilized During Treatment: Oxygen(2L)  OT Visit Diagnosis: Unsteadiness on feet (R26.81);Other abnormalities of gait and mobility (R26.89);Muscle weakness (generalized) (M62.81);Other symptoms and signs involving cognitive function   Activity Tolerance Patient limited by fatigue   Patient Left in bed;with call bell/phone within reach   Nurse Communication Mobility status        Time: [id number] OT Time Calculation (min): 44 min  Charges: OT General Charges $OT Visit: 1  Visit OT Treatments $Self Care/Home Management : 8-22 mins $Therapeutic Activity: 8-22 mins $Therapeutic Exercise: 8-22 mins  hyperlipidemia   Shamonica Schadt,HILLARY 12/01/2018, 3:25 PM

## 2018-12-01 NOTE — Progress Notes (Signed)
Modified Barium Swallow Progress Note  Patient Details  Name: Shannon Meyer MRN: [id number] Date of Birth: 1966-01-18  Today's Date: 12/01/2018  Modified Barium Swallow completed.  Full report located under Chart Review in the Imaging Section.  Brief recommendations include the following:  Clinical Impression  Pt demonstrates moderate oropharyngeal dysphagia following prolonged critical illness and intubation. 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 PMSV in place throught, pt on 2 liters [place]. Pt has relatively good oral function despite lingual wound that is healing well. LIngual manipulation for mastication, bolus formation and propulsion appears a little slow but adequate. However, premature spillage and delayed swallow initiation do lead to prolonged pooling of liquids in pyriform sinsues prior to swallow initiation. WIth thin liquids penetration and aspiration via the postirior commisure is failry consistent, no response from pt but a cued cough ejects. Chin tuck not beneficial. Nectar thick liquids and solids were not aspirated but there was mild to moderate diffuse pharyngeal residual suggesting mild generalized muscle weakness. Pt able to follow cue to swallow twice. Recommend a dys 3 mech soft) diet with nectar thick liquids and intermittent instruction to swallow again. Will follow for tolerance.    Swallow Evaluation Recommendations       SLP Diet Recommendations: Dysphagia 3 (Mech soft) solids;Nectar thick liquid   Liquid Administration via: Cup;Straw   Medication Administration: Whole meds with liquid   Supervision: Patient able to self feed;Intermittent supervision to cue for compensatory strategies   Compensations: Slow rate;Small sips/bites;Follow solids with liquid;Multiple dry swallows after each bite/sip   Postural Changes: Seated upright at 90 degrees   Oral Care Recommendations: Oral care BID   Other  Recommendations: Order thickener from [place], [place] Pager [phone] Office [phone]  Shannon Meyer, Katherene Ponto 12/01/2018,11:58 AM

## 2018-12-02 LAB — CBC WITH DIFFERENTIAL/PLATELET
Abs Immature Granulocytes: 0.07 10*3/uL (ref 0.00–0.07)
Basophils Absolute: 0.1 10*3/uL (ref 0.0–0.1)
Basophils Relative: 1 %
Eosinophils Absolute: 0.6 10*3/uL — ABNORMAL HIGH (ref 0.0–0.5)
Eosinophils Relative: 5 %
HCT: 39 % (ref 36.0–46.0)
Hemoglobin: 11.8 g/dL — ABNORMAL LOW (ref 12.0–15.0)
Immature Granulocytes: 1 %
Lymphocytes Relative: 28 %
Lymphs Abs: 3.2 10*3/uL (ref 0.7–4.0)
MCH: 29.1 pg (ref 26.0–34.0)
MCHC: 30.3 g/dL (ref 30.0–36.0)
MCV: 96.1 fL (ref 80.0–100.0)
Monocytes Absolute: 1.4 10*3/uL — ABNORMAL HIGH (ref 0.1–1.0)
Monocytes Relative: 12 %
Neutro Abs: 6.4 10*3/uL (ref 1.7–7.7)
Neutrophils Relative %: 53 %
Platelets: 334 10*3/uL (ref 150–400)
RBC: 4.06 MIL/uL (ref 3.87–5.11)
RDW: 17.6 % — ABNORMAL HIGH (ref 11.5–15.5)
WBC: 11.8 10*3/uL — ABNORMAL HIGH (ref 4.0–10.5)
nRBC: 0 % (ref 0.0–0.2)

## 2018-12-02 LAB — COMPREHENSIVE METABOLIC PANEL
ALT: 38 U/L (ref 0–44)
AST: 37 U/L (ref 15–41)
Albumin: 3 g/dL — ABNORMAL LOW (ref 3.5–5.0)
Alkaline Phosphatase: 118 U/L (ref 38–126)
Anion gap: 12 (ref 5–15)
BUN: 11 mg/dL (ref 6–20)
CO2: 29 mmol/L (ref 22–32)
Calcium: 9.1 mg/dL (ref 8.9–10.3)
Chloride: 97 mmol/L — ABNORMAL LOW (ref 98–111)
Creatinine, Ser: 0.56 mg/dL (ref 0.44–1.00)
GFR calc Af Amer: >60 mL/min (ref >60)
GFR calc non Af Amer: >60 mL/min (ref >60)
Glucose, Bld: 113 mg/dL — ABNORMAL HIGH (ref 70–99)
Potassium: 3.5 mmol/L (ref 3.5–5.1)
Sodium: 138 mmol/L (ref 135–145)
Total Bilirubin: 0.2 mg/dL — ABNORMAL LOW (ref 0.3–1.2)
Total Protein: 6.7 g/dL (ref 6.5–8.1)

## 2018-12-02 LAB — GLUCOSE, CAPILLARY
Glucose-Capillary: 102 mg/dL — ABNORMAL HIGH (ref 70–99)
Glucose-Capillary: 102 mg/dL — ABNORMAL HIGH (ref 70–99)
Glucose-Capillary: 110 mg/dL — ABNORMAL HIGH (ref 70–99)
Glucose-Capillary: 111 mg/dL — ABNORMAL HIGH (ref 70–99)
Glucose-Capillary: 120 mg/dL — ABNORMAL HIGH (ref 70–99)
Glucose-Capillary: 130 mg/dL — ABNORMAL HIGH (ref 70–99)

## 2018-12-02 LAB — MAGNESIUM: Magnesium: 2.2 mg/dL (ref 1.7–2.4)

## 2018-12-02 MED ORDER — ENSURE ENLIVE PO LIQD
237.0000 mL | Freq: Three times a day (TID) | ORAL | Status: DC
  Administered 2018-12-02 – 2018-12-09 (×18): 237 mL via ORAL

## 2018-12-02 MED ORDER — ASPIRIN EC 81 MG PO TBEC
81.0000 mg | DELAYED_RELEASE_TABLET | Freq: Every day | ORAL | Status: DC
  Administered 2018-12-02 – 2018-12-09 (×8): 81 mg via ORAL
  Filled 2018-12-02 (×8): qty 1

## 2018-12-02 MED ORDER — PANTOPRAZOLE SODIUM 40 MG PO TBEC: 40.0000 mg | DELAYED_RELEASE_TABLET | Freq: Every day | ORAL | Status: DC

## 2018-12-02 MED ORDER — FAMOTIDINE 20 MG PO TABS
20.0000 mg | ORAL_TABLET | Freq: Every day | ORAL | Status: DC
  Administered 2018-12-02 – 2018-12-08 (×7): 20 mg via ORAL
  Filled 2018-12-02 (×8): qty 1

## 2018-12-02 MED ORDER — POTASSIUM CHLORIDE 20 MEQ/15ML (10%) PO SOLN
40.0000 meq | Freq: Once | ORAL | Status: AC
  Administered 2018-12-02: 40 meq via ORAL
  Filled 2018-12-02: qty 30

## 2018-12-02 NOTE — Progress Notes (Signed)
  Speech Language Pathology Treatment: Dysphagia;Passy Muir Speaking valve  Patient Details Name: Shannon Meyer MRN: [id number] DOB: [birth year] Today's Date: 12/02/2018 Time: [id number] SLP Time Calculation (min) (ACUTE ONLY): 33 min  Assessment / Plan / Recommendation Clinical Impression  Pt seen with lunch meal, somewhat dis-coordinated and messy with self feeding, but using a slow rate and able to tolerated mastication and liquids. Only concerning texture were peaches; liquid and solid mixed elicited cough. Pts vocal quality a little stronger today, Tolerating PMSV all waking hours and pt using increased effort per 1-2 words to improve volume. Pt more attentive today, participating in conversation and humor more, personality coming back, less fatigued. Continue diet and PMSV use. Will f/u intermittently for tolerance.   HPI HPI: Pt adm 5/20 with hypoxic resp failure due to Covid 19. Pt intubated 5/20 and extubated 6/8, with re-intubation 6/8; Trach 6/11. PMH - none.       SLP Plan  Continue with current plan of care       Recommendations  Diet recommendations: Dysphagia 3 (mechanical soft);Nectar-thick liquid Liquids provided via: Cup;Straw Medication Administration: Whole meds with puree Supervision: Patient able to self feed;Intermittent supervision to cue for compensatory strategies Compensations: Slow rate;Small sips/bites;Follow solids with liquid;Multiple dry swallows after each bite/sip Postural Changes and/or Swallow Maneuvers: Seated upright 90 degrees      Patient may use Passy-Muir Speech Valve: During PO intake/meals;During all waking hours (remove during sleep);During all therapies with supervision PMSV Supervision: Intermittent MD: Please consider changing trach tube to : Smaller size;Cuffless         Follow up Recommendations: Inpatient Rehab SLP Visit Diagnosis: Dysphagia, oropharyngeal phase (R13.12) Plan: Continue with current plan of care       GO                Herbie Baltimore, MA [place] Pager [phone] Office [phone]  [name] 12/02/2018, 1:38 PM

## 2018-12-02 NOTE — Progress Notes (Signed)
Physical Therapy Treatment Patient Details Name: Shannon Meyer MRN: [id number] DOB: [birth year] Today's Date: 12/02/2018    History of Present Illness Pt adm 5/20 with hypoxic resp failure due to Covid 19. Pt intubated 5/20 and extubated 6/8, with re-intubation 6/8; Trach 6/11. PMH - none. 6/17 foul smelling trach site/secretions, elevated WBC and temp; vomited tube feeds.    PT Comments    Patient continues to desaturate (low 70s on 2L [place]) with activity and requires prolonged period (10-15 minutes) and incr to 4L to return to low 90s SaO2. She is also limited by anxiety and today had +orthostasis with sitting EOB (after ~7 minutes). She is motivated and wanted to get OOB and after resting in sidelying, returned to sitting, transferred via stedy to recliner, and up in recliner with legs elevated. Anticipate she would do well in structured CIR setting once medically appropriate.    Follow Up Recommendations  CIR     Equipment Recommendations  Other (comment)    Recommendations for Other Services Rehab consult     Precautions / Restrictions Precautions Precautions: Fall Precaution Comments: monitor vitals; impulsive Restrictions Weight Bearing Restrictions: No    Mobility  Bed Mobility Overal bed mobility: Needs Assistance Bed Mobility: Rolling;Sidelying to Sit;Sit to Sidelying Rolling: Min assist Sidelying to sit: Min assist     Sit to sidelying: Mod assist General bed mobility comments: 1st attempt pt came up from side to sit VERY quickly and immediately became anxious, reporting she felt she was going to pass out; ultimately returned to sidelying and on 2nd attempt, with cues pt came up much slower, less anxious, however required max cues to stay on task and for safety  Transfers Overall transfer level: Needs assistance   Transfers: Sit to/from Stand Sit to Stand: Min assist;+2 safety/equipment(from EOB, elevated slightly to allow position of stedy)         General  transfer comment: 1st attempt, pt became too anxious had to remove stedy and return to sidelying; 2nd attempt given step-by-step instructions of what to expect and during process with better success  Ambulation/Gait                 Stairs             Wheelchair Mobility    Modified Rankin (Stroke Patients Only)       Balance Overall balance assessment: Needs assistance Sitting-balance support: Bilateral upper extremity supported;Feet supported Sitting balance-[name] Scale: Fair Sitting balance - Comments: posterior lean at times; feel pt is trying to lay back down due to anxiety with breathing   Standing balance support: Bilateral upper extremity supported Standing balance-[name] Scale: Poor Standing balance comment: in stedy, only long enough to secure stedy seat                            Cognition Arousal/Alertness: Awake/alert Behavior During Therapy: Impulsive;Anxious Overall Cognitive Status: Impaired/Different from baseline Area of Impairment: Orientation;Attention;Memory;Safety/judgement;Awareness;Problem solving                 Orientation Level: Disoriented to;Time(but able to figure out with clues) Current Attention Level: Sustained;Selective(moments of selective) Memory: Decreased short-term memory Following Commands: Follows one step commands consistently Safety/Judgement: Decreased awareness of safety;Decreased awareness of deficits Awareness: Emergent Problem Solving: Slow processing;Decreased initiation;Difficulty sequencing;Requires verbal cues General Comments: "I'm afraid I'm going to fall" Pt remains very anxious throughout session, can calm down with vc and reassurance, and then shortly will be anxious again.  Exercises      General Comments General comments (skin integrity, edema, etc.): Upon initial sitting EOB BP, pt not orthostatic (BP increased); however after ~7 minutes BP reassessed and +orthostasis (>20 mmHg  drop in SBP). After transfer to chair, pt saturating low 70s% with very slow increase to 80% (on 2L [place]); would briefly increase to 85% and then immmediately begin to decline to 79-80% and then lose a reliable waveform. New probe placed on finger with improved quality of waveform and pt remained 80-85% on 2L (after having rested in chair ~10 minutes). Incr to 4L [place] O2 and pt slowly increased to 90% and by the time PT exited room pt was 93%      Pertinent Vitals/Pain Pain Assessment: No/denies pain    Home Living                      Prior Function            PT Goals (current goals can now be found in the care plan section) Acute Rehab PT Goals Patient Stated Goal: to get better and see her grand daughter Time For Goal Achievement: 12/10/18 Progress towards PT goals: Progressing toward goals    Frequency    Min 3X/week      PT Plan Current plan remains appropriate    Co-evaluation PT/OT/SLP Co-Evaluation/Treatment: Yes Reason for Co-Treatment: Complexity of the patient's impairments (multi-system involvement);For patient/therapist safety PT goals addressed during session: Mobility/safety with mobility;Balance        AM-PAC PT "6 Clicks" Mobility   Outcome Measure  Help needed turning from your back to your side while in a flat bed without using bedrails?: A Little Help needed moving from lying on your back to sitting on the side of a flat bed without using bedrails?: A Little Help needed moving to and from a bed to a chair (including a wheelchair)?: Total Help needed standing up from a chair using your arms (e.g., wheelchair or bedside chair)?: Total Help needed to walk in hospital room?: Total Help needed climbing 3-5 steps with a railing? : Total 6 Click Score: 10    End of Session Equipment Utilized During Treatment: Oxygen Activity Tolerance: Treatment limited secondary to medical complications (Comment)(anxiety and desaturation) Patient left: in  chair;with nursing/sitter in room;with chair alarm set;with call bell/phone within reach(telesitter) Nurse Communication: Mobility status;Need for lift equipment;Other (comment)(impulsiveness and need 2 person assist with stedy) PT Visit Diagnosis: Other abnormalities of gait and mobility (R26.89);Muscle weakness (generalized) (M62.81)     Time: [id number] PT Time Calculation (min) (ACUTE ONLY): 64 min  Charges:  $Therapeutic Activity: 23-37 mins                        Computer Sciences CorporationLynn P Robbyn Hodkinson, PT 12/02/2018, 1:52 PM

## 2018-12-02 NOTE — Progress Notes (Signed)
Unable to flush blood port in PICC. Will notify day RN for IV team to assess and intervene. Labs drawn from peripheral vein in hand this morning.

## 2018-12-02 NOTE — Progress Notes (Signed)
PROGRESS NOTE                                                                                                                                                                                                             Patient Demographics:    Shannon Meyer, is a 53 y.o. female, DOB - 05/12/1966, BUL:[id number]  Outpatient Primary MD for the patient is No primary care provider on file.    LOS - 52  Admit date - 10/22/2018    Chief Complaint  Patient presents with   Weakness       Brief Narrative  Shannon Meyer is a 53 y.o. female with no significant medical history who presented with cough, weakness and shortness of breath found to have covid-19 with bilateral CXR opacities, intubated in ED and admitted to [facility] 5/20. She has had a prolonged hospital course. She was unable to wean from ventilator and underwent tracheostomy. She began developing fevers and found to have pseudomonas in her sputum. She is currently on antibiotics. Still has significant leukocytosis that is being monitored. Weaning off oxygen. Plan is for CIR/SNF/LTAC once stable.   Subjective:   Patient in bed, appears comfortable, denies any headache, no fever, no chest pain or pressure, no shortness of breath , no abdominal pain. No focal weakness.   Assessment  & Plan :     1. Acute Hypoxic Resp. Failure due to Acute Covid 19 Viral Pneumonitis during the ongoing 2020 Covid 19 Pandemic - she has completed remdesivir 5/21 - 5/30, received actemra 5/20, and 5/21. Received CTX 5/20-5/26, azithromycin 5/20-5/24. Needed to be intubated subsequently trached after she failed extubation on 11/10/2018, currently on trach collar.  Requiring 4 L oxygen per minute.  Continue supportive care. Will advance activity, PT, Med surg transfer.   ABG     Component Value Date/Time   PHART 7.424 11/22/2018 2116   PCO2ART 41.5 11/22/2018 2116   PO2ART 65.0 (L) 11/22/2018 2116   HCO3 27.1 11/22/2018 2116   TCO2 28 11/22/2018 2116   ACIDBASEDEF 1.0 10/24/2018 0419   O2SAT 92.0 11/22/2018 2116    COVID-19 Labs  No results for input(s): DDIMER, FERRITIN, LDH, CRP in the last 72 hours.  Lab Results  Component Value Date   SARSCOV2NAA POSITIVE (A) 10/22/2018     Hepatic Function Latest Ref Rng & Units 12/02/2018  12/01/2018 11/30/2018  Total Protein 6.5 - 8.1 g/dL 6.7 6.6 6.5  Albumin 3.5 - 5.0 g/dL 3.0(L) 3.0(L) 2.9(L)  AST 15 - 41 U/L 37 36 57(H)  ALT 0 - 44 U/L 38 38 38  Alk Phosphatase 38 - 126 U/L 118 127(H) 126  Total Bilirubin 0.3 - 1.2 mg/dL 0.2(L) 0.4 0.7        Component Value Date/Time   BNP 89.5 11/28/2018 0300      2.  Pseudomonas pneumonia -  Currently on Fortaz and clinically improving.  Continue for now.  Leukocytosis has improved and she is currently afebrile. Finished 7 days of treatment on 11/27/2018.  3.  ICU delirium, toxic encephalopathy.  Minimize sedatives and narcotics, Seroquel nightly, sitter if needed, restraints only if becomes danger to self or others by pulling lines or tubes.  Expose to sunlight, up in chair, PT. Much improved.  4.  Sinus tachycardia with questionable brief run of A. fib for a few minutes morning of 11/26/2018.  This would be a single isolated episode, no previous echo on file, she had stable TSH, currently on beta-blocker, unremarkable EKG, will place her on aspirin 81 mg, case discussed with cardiologist Dr. Marlou Porch who recommends no further change in treatment or plan, outpatient echocardiogram and cardiology follow-up.  Mali vas 2 score will be 3 .  5.  Hypokalemia.  Replaced and stable.  6. 1 Out of 4 blood culture contamination.  Stable monitor.  7.  Obesity with a BMI of 34.  Outpatient follow-up with PCP.  8.  Tongue laceration which was noted by nursing staff several days ago, likely while she was intubated.  Healing well.  Supportive care.  Outpatient ENT follow-up if needed.  Speech following.   9.   Tracheostomy induced dysphagia requiring tube feeds. Currently on PSMV valve, speech following and she underwent modified barium swallow, currently on dysphagia 3 diet and doing well clinically, hopefully she can be decannulated this admission if she stays stable.    Condition - Fair  Family Communication  : daughter [name] called 11/28/18 @ 9.30 am   Code Status : Full code  Diet : Tube feeds  Diet Order            DIET DYS 3 Room service appropriate? Yes with Assist; Fluid consistency: Nectar Thick  Diet effective now               Disposition - Med  Consults  :  PCCM  Procedures  :   5/20 Intubated in ED and admitted to [facility] ICU 5/21-5/26 Weaning FIO2 and PEEP, diuresing 5/26-6/3 Tolerating weaning for 1-2 hours 6/7 weaning on pressure support ventilation again this morning 10/5 6/8 extubated, reintubated (tachypnea, tachycardia) 6/12 Trached 6/13 Trach collar all day  6/14 bleeding from tracheostomy site 6/17 48 hours off vent, fever > pseudomonas pneumonia 6/20 slightly more awake, remains off vent 6/26 mentation much improved moved out of ICU 6/29 modified barium swallow due  PUD Prophylaxis : Famotidine   DVT Prophylaxis  :    Heparin    Lab Results  Component Value Date   PLT 334 12/02/2018    Inpatient Medications  Scheduled Meds:  aspirin EC  81 mg Oral Daily   chlorhexidine  15 mL Mouth/Throat BID   Chlorhexidine Gluconate Cloth  6 each Topical Daily   famotidine  20 mg Oral QHS   heparin injection (subcutaneous)  7,500 Units Subcutaneous Q8H   insulin aspart  0-9 Units Subcutaneous  Q4H   mouth rinse  15 mL Mouth Rinse QID   metoprolol tartrate  50 mg Oral BID   sodium chloride flush  10-40 mL Intracatheter Q12H   Continuous Infusions:  dextrose 75 mL/hr at 12/02/18 0638   PRN Meds:.acetaminophen, hydrALAZINE, ipratropium-albuterol, metoprolol tartrate, ondansetron (ZOFRAN) IV, polyethylene glycol, QUEtiapine,  Resource ThickenUp Clear, sennosides  Antibiotics  :    Anti-infectives (From admission, onward)   Start     Dose/Rate Route Frequency Ordered Stop   11/21/18 2200  cefTAZidime (FORTAZ) 2 g in sodium chloride 0.9 % 100 mL IVPB     2 g 200 mL/hr over 30 Minutes Intravenous Every 8 hours 11/21/18 1426 11/28/18 0656   11/21/18 1400  cefTAZidime (FORTAZ) 2 g in sodium chloride 0.9 % 100 mL IVPB  Status:  Discontinued     2 g 200 mL/hr over 30 Minutes Intravenous Every 8 hours 11/21/18 1330 11/21/18 1426   11/20/18 0400  vancomycin (VANCOCIN) IVPB 1000 mg/200 mL premix  Status:  Discontinued     1,000 mg 200 mL/hr over 60 Minutes Intravenous Every 12 hours 11/19/18 1517 11/20/18 1344   11/19/18 1530  vancomycin (VANCOCIN) 1,500 mg in sodium chloride 0.9 % 500 mL IVPB     1,500 mg 250 mL/hr over 120 Minutes Intravenous  Once 11/19/18 1517 11/19/18 1744   11/19/18 1530  piperacillin-tazobactam (ZOSYN) IVPB 3.375 g     3.375 g 12.5 mL/hr over 240 Minutes Intravenous Every 8 hours 11/19/18 1517 11/21/18 1734   11/01/18 1000  fluconazole (DIFLUCAN) IVPB 100 mg     100 mg 50 mL/hr over 60 Minutes Intravenous Every 24 hours 11/01/18 0857 11/03/18 1137   10/27/18 2200  cefTRIAXone (ROCEPHIN) 2 g in sodium chloride 0.9 % 100 mL IVPB     2 g 200 mL/hr over 30 Minutes Intravenous Every 24 hours 10/27/18 0835 10/28/18 2243   10/24/18 1400  remdesivir 100 mg in sodium chloride 0.9 % 250 mL IVPB  Status:  Discontinued     100 mg over 30 Minutes Intravenous Every 24 hours 10/23/18 1153 10/24/18 1335   10/24/18 1400  remdesivir 100 mg in sodium chloride 0.9 % 220 mL IVPB  Status:  Discontinued     100 mg over 30 Minutes Intravenous Every 24 hours 10/24/18 1334 10/24/18 1336   10/24/18 1400  remdesivir 100 mg in sodium chloride 0.9 % 230 mL IVPB     100 mg over 30 Minutes Intravenous Every 24 hours 10/24/18 1335 11/01/18 1500   10/24/18 1400  remdesivir 100 mg in sodium chloride 0.9 % 230 mL IVPB   Status:  Discontinued     100 mg over 30 Minutes Intravenous Every 24 hours 10/24/18 1337 10/24/18 1339   10/23/18 1400  remdesivir 200 mg in sodium chloride 0.9 % 250 mL IVPB     200 mg over 30 Minutes Intravenous Once 10/23/18 1153 10/23/18 1738   10/22/18 2200  azithromycin (ZITHROMAX) 500 mg in sodium chloride 0.9 % 250 mL IVPB     500 mg 250 mL/hr over 60 Minutes Intravenous Every 24 hours 10/22/18 2148 10/26/18 2255   10/22/18 2200  cefTRIAXone (ROCEPHIN) 1 g in sodium chloride 0.9 % 100 mL IVPB  Status:  Discontinued     1 g 200 mL/hr over 30 Minutes Intravenous Every 24 hours 10/22/18 2148 10/27/18 0835       Time Spent in minutes  [place] M.D on 12/02/2018 at 10:17 AM  To page  go to [email or web address] - password [facility]  [facility] -  Office  [phone]     See all Orders from today for further details    Objective:   Vitals:   12/02/18 0500 12/02/18 0625 12/02/18 0740 12/02/18 0753  BP:    132/69  Pulse:  90  (!) 104  Resp:  (!) 33  (!) 26  Temp:   98.3 F (36.8 C)   TempSrc:   Oral   SpO2:  97%  98%  Weight: 89 kg     Height:        Wt Readings from Last 3 Encounters:  12/02/18 89 kg     Intake/Output Summary (Last 24 hours) at 12/02/2018 1017 Last data filed at 12/02/2018 0741 Gross per 24 hour  Intake 1699.09 ml  Output 850 ml  Net 849.09 ml     Physical Exam  Awake,  trach collar in place,  foley catheter along with right arm PICC line in place [place].AT,PERRAL Supple Neck,No JVD, No cervical lymphadenopathy appriciated.  Symmetrical Chest wall movement, Good air movement bilaterally, CTAB RRR,No Gallops, Rubs or new Murmurs, No Parasternal Heave +ve B.Sounds, Abd Soft, No tenderness, No organomegaly appriciated, No rebound - guarding or rigidity. No Cyanosis, Clubbing or edema, No new Rash or bruise      Data Review:    CBC Recent Labs  Lab 11/28/18 0300 11/29/18 0325 11/30/18 0645 12/01/18 0700 12/02/18 0610  WBC  14.3* 15.1* 16.9* 15.1* 11.8*  HGB 11.3* 11.6* 11.8* 11.9* 11.8*  HCT 37.5 39.6 40.0 39.8 39.0  PLT 344 398 347 345 334  MCV 98.2 98.8 96.9 96.8 96.1  MCH 29.6 28.9 28.6 29.0 29.1  MCHC 30.1 29.3* 29.5* 29.9* 30.3  RDW 18.3* 18.1* 17.8* 17.6* 17.6*  LYMPHSABS 4.0 3.6 4.1* 4.4* 3.2  MONOABS 1.7* 1.3* 1.5* 1.4* 1.4*  EOSABS 1.2* 0.8* 0.6* 0.5 0.6*  BASOSABS 0.1 0.1 0.1 0.1 0.1    Chemistries  Recent Labs  Lab 11/28/18 0300 11/29/18 0325 11/30/18 0645 12/01/18 0700 12/02/18 0610  NA 142 146* 137 138 138  K 3.7 3.8 4.7 3.8 3.5  CL 106 97* 97* 97* 97*  CO2 '27 31 26 31 29  ' GLUCOSE 95 108* 92 117* 113*  BUN 28* 22* 21* 12 11  CREATININE 0.57 0.45 0.54 0.41* 0.56  CALCIUM 8.5* 9.6 8.8* 9.3 9.1  MG 2.2 2.4 2.6* 2.2 2.2  AST 26 36 57* 36 37  ALT 30 38 38 38 38  ALKPHOS 114 131* 126 127* 118  BILITOT <0.1* 0.1* 0.7 0.4 0.2*   ------------------------------------------------------------------------------------------------------------------ No results for input(s): CHOL, HDL, LDLCALC, TRIG, CHOLHDL, LDLDIRECT in the last 72 hours.  No results found for: HGBA1C ------------------------------------------------------------------------------------------------------------------ No results for input(s): TSH, T4TOTAL, T3FREE, THYROIDAB in the last 72 hours.  Invalid input(s): FREET3  Lab Results  Component Value Date   TSH 1.034 11/19/2018    Cardiac Enzymes No results for input(s): CKMB, TROPONINI, MYOGLOBIN in the last 168 hours.  Invalid input(s): CK ------------------------------------------------------------------------------------------------------------------    Component Value Date/Time   BNP 89.5 11/28/2018 0300    Micro Results Recent Results (from the past 240 hour(s))  MRSA PCR Screening     Status: None   Collection Time: 11/28/18  2:00 PM   Specimen: Nasal Mucosa; Nasopharyngeal  Result Value Ref Range Status   MRSA by PCR NEGATIVE NEGATIVE Final     Comment:        The GeneXpert MRSA Assay (FDA approved for NASAL specimens  only), is one component of a comprehensive MRSA colonization surveillance program. It is not intended to diagnose MRSA infection nor to guide or monitor treatment for MRSA infections. Performed at [facility], [place] [address] [place], [place] [address]     Radiology Reports Dg Abd 1 View  Result Date: 11/10/2018 CLINICAL DATA:  Orogastric placement EXAM: ABDOMEN - 1 VIEW COMPARISON:  None. FINDINGS: Orogastric tube enters the stomach, loops in the body and has its tip in the antrum. Bowel gas pattern appears normal. IMPRESSION: Orogastric tip in the antrum. Electronically Signed   By: Nelson Chimes M.D.   On: 11/10/2018 16:50   Dg Chest Port 1 View  Result Date: 11/25/2018 CLINICAL DATA:  ARDS, COVID-19 positive EXAM: PORTABLE CHEST 1 VIEW COMPARISON:  Portable exam 0508 hours compared to 11/19/2018 FINDINGS: Tracheostomy tube unchanged. Tip of RIGHT arm PICC line projects over cavoatrial junction. Feeding tube extends to at least the second portion of the duodenum. Stable heart size. Diffuse BILATERAL airspace infiltrates, unchanged. No pleural effusion or pneumothorax. IMPRESSION: Persistent diffuse BILATERAL airspace infiltrates Electronically Signed   By: Lavonia Dana M.D.   On: 11/25/2018 08:08   Dg Chest Port 1 View  Result Date: 11/19/2018 CLINICAL DATA:  Fever EXAM: PORTABLE CHEST 1 VIEW COMPARISON:  11/13/2018, 11/11/2018 FINDINGS: Tracheostomy tube in satisfactory position. Feeding tube coiled in the stomach. Right-sided PICC line with the tip projecting over the SVC. Worsening bilateral patchy interstitial and alveolar airspace disease throughout bilateral lungs concerning for multilobar pneumonia. No pleural effusion or pneumothorax. Stable cardiomediastinal silhouette. No aggressive osseous lesion. IMPRESSION: 1. Support lines and tubing in satisfactory position. 2. Worsening bilateral  patchy interstitial and alveolar airspace disease throughout bilateral lungs concerning for multilobar pneumonia. Electronically Signed   By: Kathreen Devoid   On: 11/19/2018 13:38   Dg Chest Port 1 View  Result Date: 11/13/2018 CLINICAL DATA:  Tracheostomy placement.  COVID-19. EXAM: PORTABLE CHEST 1 VIEW COMPARISON:  11/11/2018. FINDINGS: Tracheostomy is been inserted. Tip lies 4.5 cm above carina. BILATERAL pulmonary opacities persist, LEFT greater than RIGHT, not significantly improved. Cardiomegaly. Feeding tube duodenum. IMPRESSION: Satisfactory tracheostomy placement. No significant improvement aeration. Electronically Signed   By: Staci Righter M.D.   On: 11/13/2018 16:16   Dg Chest Port 1 View  Result Date: 11/11/2018 CLINICAL DATA:  Follow-up aspiration EXAM: PORTABLE CHEST 1 VIEW COMPARISON:  11/10/2018 FINDINGS: Cardiac shadow is stable. Endotracheal tube and gastric catheter are noted in satisfactory position. Right-sided PICC line is noted at the cavoatrial junction. Patchy infiltrates are again seen bilaterally stable from the prior exam. No pneumothorax is noted. IMPRESSION: No change from the previous day. Electronically Signed   By: Inez Catalina M.D.   On: 11/11/2018 07:52   Dg Chest Port 1 View  Result Date: 11/10/2018 CLINICAL DATA:  Intubation.  OG tube placement. EXAM: PORTABLE CHEST 1 VIEW COMPARISON:  November 09, 2018 FINDINGS: The ETT remains in good position, 2 cm above the carina. Bilateral pulmonary infiltrates remain, similar on the left and more prominent on the right in the interval. The OG tube terminates below today's film. No other changes. IMPRESSION: Support apparatus as above. Bilateral pulmonary infiltrates are stable on the left and mildly more prominent on the right in the interval. Electronically Signed   By: Dorise Bullion III M.D   On: 11/10/2018 16:27   Dg Chest Port 1 View  Result Date: 11/09/2018 CLINICAL DATA:  53 year old female positive COVID-19 status post  PICC placement. EXAM:  PORTABLE CHEST 1 VIEW COMPARISON:  Chest radiograph dated 11/04/2018 FINDINGS: A right-sided PICC is noted with tip over the right subclavian vein. Recommend further advancing by an additional approximately 10 cm. Endotracheal tube remains above the carina in similar position. Enteric tube extends below the diaphragm likely within the stomach. Interval progression of bilateral confluent densities since the prior radiograph. No large pleural effusion. There is no pneumothorax. No acute osseous pathology. Stable cardiac silhouette. IMPRESSION: 1. Right-sided PICC with tip over the right subclavian vein. Recommend further advancing an additional 10 cm. 2. Interval progression of bilateral confluent densities. Electronically Signed   By: Anner Crete M.D.   On: 11/09/2018 03:25   Dg Chest Port 1 View  Result Date: 11/04/2018 CLINICAL DATA:  Acute respiratory failure EXAM: PORTABLE CHEST 1 VIEW COMPARISON:  10/31/2018 FINDINGS: Cardiac shadow is stable. Endotracheal tube, right-sided PICC line and gastric catheter are noted in satisfactory position. Multifocal infiltrates are identified bilaterally left greater than right stable from the prior exam. No bony abnormality is seen. IMPRESSION: Stable bilateral infiltrates Tubes and lines in satisfactory position. Electronically Signed   By: Inez Catalina M.D.   On: 11/04/2018 07:01   Dg Abd Portable 1v  Result Date: 11/29/2018 CLINICAL DATA:  Dysphagia EXAM: PORTABLE ABDOMEN - 1 VIEW COMPARISON:  Plain film of the abdomen dated 11/21/2018. FINDINGS: Enteric tube appears adequately positioned in the stomach. Visualized bowel gas pattern is nonobstructive. No evidence of free intraperitoneal air. IMPRESSION: Enteric tube appears adequately positioned in the stomach. Electronically Signed   By: Franki Cabot M.D.   On: 11/29/2018 11:29   Dg Abd Portable 1v  Result Date: 11/21/2018 CLINICAL DATA:  NG tube placement. EXAM: PORTABLE ABDOMEN - 1  VIEW COMPARISON:  Plain films of the abdomen dated 11/20/2018 and 11/19/2018. FINDINGS: Weighted tip feeding tube now appears appropriately positioned with tip in the proximal duodenum. Visualized bowel gas pattern is nonobstructive. IMPRESSION: Weighted tip feeding tube now appears appropriately positioned with tip in the proximal duodenum. Electronically Signed   By: Franki Cabot M.D.   On: 11/21/2018 12:24   Dg Abd Portable 1v  Result Date: 11/20/2018 CLINICAL DATA:  Encounter for NG tube placement EXAM: PORTABLE ABDOMEN - 1 VIEW COMPARISON:  11/19/2018 FINDINGS: Feeding tube coils in the stomach with the tip in the fundus. Nonobstructive bowel gas pattern. No organomegaly or free air. IMPRESSION: Feeding tube coils in the stomach with the tip in the fundus. Electronically Signed   By: Rolm Baptise M.D.   On: 11/20/2018 19:57   Dg Abd Portable 1v  Result Date: 11/19/2018 CLINICAL DATA:  Vomiting. EXAM: PORTABLE ABDOMEN - 1 VIEW COMPARISON:  None. FINDINGS: A feeding tube coils in the stomach with the distal tip in the fundus. No evidence of bowel obstruction. IMPRESSION: A feeding tube coils in the stomach. No evidence of bowel obstruction. No cause for vomiting noted. Electronically Signed   By: Dorise Bullion III M.D   On: 11/19/2018 16:43   Dg Swallowing Func-speech Pathology  Result Date: 12/01/2018 Objective Swallowing Evaluation: Type of Study: MBS-Modified Barium Swallow Study  Patient Details Name: Madge Therrien MRN: [id number] Date of Birth: 1965-06-29 Today's Date: 12/01/2018 Time: SLP Start Time (ACUTE ONLY): 1050 -SLP Stop Time (ACUTE ONLY): 1122 SLP Time Calculation (min) (ACUTE ONLY): 32 min Past Medical History: No past medical history on file. Past Surgical History: No past surgical history on file. HPI: Pt adm 5/20 with hypoxic resp failure due to Covid 19. Pt intubated 5/20 and  extubated 6/8, with re-intubation 6/8; Trach 6/11. PMH - none.  Subjective: alert, distractible Assessment /  Plan / Recommendation CHL IP CLINICAL IMPRESSIONS 12/01/2018 Clinical Impression Pt demonstrates moderate oropharyngeal dysphagia following prolonged critical illness and intubation. 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 PMSV in place throught, pt on 2 liters [place]. Pt has relatively good oral function despite lingual wound that is healing well. LIngual manipulation for mastication, bolus formation and propulsion appears a little slow but adequate. However, premature spillage and delayed swallow initiation do lead to prolonged pooling of liquids in pyriform sinsues prior to swallow initiation. WIth thin liquids penetration and aspiration via the postirior commisure is failry consistent, no response from pt but a cued cough ejects. Chin tuck not beneficial. Nectar thick liquids and solids were not aspirated but there was mild to moderate diffuse pharyngeal residual suggesting mild generalized muscle weakness. Pt able to follow cue to swallow twice. Recommend a dys 3 mech soft) diet with nectar thick liquids and intermittent instruction to swallow again. Will follow for tolerance.  SLP Visit Diagnosis Dysphagia, oropharyngeal phase (R13.12) Attention and concentration deficit following -- Frontal lobe and executive function deficit following -- Impact on safety and function Moderate aspiration risk   CHL IP TREATMENT RECOMMENDATION 12/01/2018 Treatment Recommendations Therapy as outlined in treatment plan below   Prognosis 12/01/2018 Prognosis for Safe Diet Advancement Good Barriers to Reach Goals Cognitive deficits Barriers/Prognosis Comment -- CHL IP DIET RECOMMENDATION 12/01/2018 SLP Diet Recommendations Dysphagia 3 (Mech soft) solids;Nectar thick liquid Liquid Administration via Cup;Straw Medication Administration Whole meds with liquid Compensations Slow rate;Small sips/bites;Follow solids with liquid;Multiple dry swallows after each bite/sip Postural  Changes Seated upright at 90 degrees   CHL IP OTHER RECOMMENDATIONS 12/01/2018 Recommended Consults -- Oral Care Recommendations Oral care BID Other Recommendations Order thickener from pharmacy   CHL IP FOLLOW UP RECOMMENDATIONS 12/01/2018 Follow up Recommendations Inpatient Rehab   CHL IP FREQUENCY AND DURATION 12/01/2018 Speech Therapy Frequency (ACUTE ONLY) min 2x/week Treatment Duration 2 weeks      CHL IP ORAL PHASE 12/01/2018 Oral Phase WFL Oral - Pudding Teaspoon -- Oral - Pudding Cup -- Oral - Honey Teaspoon -- Oral - Honey Cup -- Oral - Nectar Teaspoon -- Oral - Nectar Cup -- Oral - Nectar Straw -- Oral - Thin Teaspoon -- Oral - Thin Cup -- Oral - Thin Straw -- Oral - Puree -- Oral - Mech Soft -- Oral - Regular -- Oral - Multi-Consistency -- Oral - Pill -- Oral Phase - Comment --  CHL IP PHARYNGEAL PHASE 12/01/2018 Pharyngeal Phase Impaired Pharyngeal- Pudding Teaspoon -- Pharyngeal -- Pharyngeal- Pudding Cup -- Pharyngeal -- Pharyngeal- Honey Teaspoon -- Pharyngeal -- Pharyngeal- Honey Cup -- Pharyngeal -- Pharyngeal- Nectar Teaspoon -- Pharyngeal -- Pharyngeal- Nectar Cup Delayed swallow initiation-pyriform sinuses;Reduced tongue base retraction;Reduced laryngeal elevation;Reduced anterior laryngeal mobility;Reduced epiglottic inversion;Reduced pharyngeal peristalsis;Pharyngeal residue - valleculae;Pharyngeal residue - pyriform Pharyngeal -- Pharyngeal- Nectar Straw Delayed swallow initiation-pyriform sinuses;Reduced tongue base retraction;Reduced laryngeal elevation;Reduced anterior laryngeal mobility;Reduced epiglottic inversion;Reduced pharyngeal peristalsis;Pharyngeal residue - valleculae;Pharyngeal residue - pyriform Pharyngeal -- Pharyngeal- Thin Teaspoon -- Pharyngeal -- Pharyngeal- Thin Cup Delayed swallow initiation-pyriform sinuses;Reduced airway/laryngeal closure;Penetration/Aspiration during swallow;Trace aspiration;Pharyngeal residue - pyriform Pharyngeal Material enters airway, CONTACTS cords  and not ejected out;Material enters airway, passes BELOW cords without attempt by patient to eject out (silent aspiration) Pharyngeal- Thin Straw Delayed swallow initiation-pyriform sinuses;Reduced airway/laryngeal closure;Penetration/Aspiration during swallow;Trace aspiration;Pharyngeal residue - pyriform;Compensatory strategies attempted (with notebox) Pharyngeal Material enters  airway, passes BELOW cords without attempt by patient to eject out (silent aspiration);Material enters airway, CONTACTS cords and not ejected out Pharyngeal- Puree Delayed swallow initiation-pyriform sinuses;Reduced tongue base retraction;Reduced laryngeal elevation;Reduced anterior laryngeal mobility;Reduced epiglottic inversion;Reduced pharyngeal peristalsis;Pharyngeal residue - valleculae;Pharyngeal residue - pyriform Pharyngeal -- Pharyngeal- Mechanical Soft -- Pharyngeal -- Pharyngeal- Regular Delayed swallow initiation-pyriform sinuses;Reduced tongue base retraction;Reduced laryngeal elevation;Reduced anterior laryngeal mobility;Reduced epiglottic inversion;Reduced pharyngeal peristalsis;Pharyngeal residue - valleculae;Pharyngeal residue - pyriform Pharyngeal -- Pharyngeal- Multi-consistency -- Pharyngeal -- Pharyngeal- Pill -- Pharyngeal -- Pharyngeal Comment --  No flowsheet data found. DeBlois, Katherene Ponto 12/01/2018, 12:02 PM              [place] Ekg Site Rite  Result Date: 11/09/2018 If [facility] image not attached, placement could not be confirmed due to current cardiac rhythm.

## 2018-12-02 NOTE — Progress Notes (Signed)
Per MD order, PICC line removed. Cath intact at 38cm. Vaseline pressure gauze to site,No bleeding to site. PICC removed by Fredrik Cove RN/ [facility]

## 2018-12-02 NOTE — Progress Notes (Signed)
Called to give update on pt to pt's daughter [name]. Denies questions at this time. Provided update.

## 2018-12-02 NOTE — Progress Notes (Signed)
 Nutrition Follow-up   RD working remotely.   DOCUMENTATION CODES:   Obesity unspecified  INTERVENTION:   Ensure Enlive po TID, each supplement provides 350 kcal and 20 grams of protein  Pt receiving Hormel Shake daily with Breakfast which provides 520 kcals and 22 g of protein and Magic cup BID with lunch and dinner, each supplement provides 290 kcal and 9 grams of protein, automatically on meal trays to optimize nutritional intake.   Feeding assistance and encouragement as needed  NUTRITION DIAGNOSIS:   Inadequate oral intake related to inability to eat as evidenced by NPO status.  Being addressed as diet advanced, supplements ordered  GOAL:   Patient will meet greater than or equal to 90% of their needs  Progressing  MONITOR:   PO intake, Supplement acceptance, Labs, Weight trends, Skin  REASON FOR ASSESSMENT:   Ventilator, Consult Enteral/tube feeding initiation and management  ASSESSMENT:   53 yo female with no significant PMH who was admitted with COVID-19.  5/20 Admitted, Intubated 6/08 Extubated, Re-Intubated 6/12 Trach placed 6/26 Transferred out of ICU 6/28 Pt pulled Cortrak tube out, unable to replace NG despite multiple attempts 6/29 MBS performed, diet advanced to Dysphagia III, Nectar Thick  Tolerating trach collar, 4L/min. PMV as tolerated. Per MD notes, possible decannulation prior to discharge  Pt with tongue laceration that is healing well. MASD to perineum, rectal tube has been removed  Pt on Dysphagia III diet with Nectar Thick Liquids and tolerating per report. No recorded po intake  ICU delerium improving per MD notes  Currently weight 89 kg; admission wt 94.7 kg. Recent weight appears stable  Labs: reviewed Meds: D5 at 75 ml/hr, ss novolog   Diet Order:   Diet Order            DIET DYS 3 Room service appropriate? Yes with Assist; Fluid consistency: Nectar Thick  Diet effective now              EDUCATION NEEDS:   Not  appropriate for education at this time  Skin:  Skin Assessment: Skin Integrity Issues: Skin Integrity Issues:: Other (Comment) Stage II: n/a Other: tongue laceration (healing well per MD), MASD to perineum  Last BM:  6/30  Height:   Ht Readings from Last 1 Encounters:  10/22/18 5\' 4"  (1.626 m)    Weight:   Wt Readings from Last 1 Encounters:  12/02/18 89 kg    Ideal Body Weight:  54.5 kg  BMI:  Body mass index is 33.68 kg/m.  Estimated Nutritional Needs:   Kcal:  1850-2050 kcals  Protein:  95-105 g  Fluid:  >/= 1.8 L    Bowie Doiron MS, RDN, LDN, CNSC [phone] Pager  [phone] Weekend/On-Call Pager

## 2018-12-02 NOTE — Progress Notes (Signed)
Patient's daughter Katya Rolston called to check on her mother. I updated her. Questions were encouraged and answered.

## 2018-12-03 LAB — CBC WITH DIFFERENTIAL/PLATELET
Abs Immature Granulocytes: 0.09 10*3/uL — ABNORMAL HIGH (ref 0.00–0.07)
Basophils Absolute: 0.1 10*3/uL (ref 0.0–0.1)
Basophils Relative: 1 %
Eosinophils Absolute: 0.7 10*3/uL — ABNORMAL HIGH (ref 0.0–0.5)
Eosinophils Relative: 6 %
HCT: 39.9 % (ref 36.0–46.0)
Hemoglobin: 11.8 g/dL — ABNORMAL LOW (ref 12.0–15.0)
Immature Granulocytes: 1 %
Lymphocytes Relative: 33 %
Lymphs Abs: 4 10*3/uL (ref 0.7–4.0)
MCH: 28.7 pg (ref 26.0–34.0)
MCHC: 29.6 g/dL — ABNORMAL LOW (ref 30.0–36.0)
MCV: 97.1 fL (ref 80.0–100.0)
Monocytes Absolute: 1.9 10*3/uL — ABNORMAL HIGH (ref 0.1–1.0)
Monocytes Relative: 15 %
Neutro Abs: 5.3 10*3/uL (ref 1.7–7.7)
Neutrophils Relative %: 44 %
Platelets: 348 10*3/uL (ref 150–400)
RBC: 4.11 MIL/uL (ref 3.87–5.11)
RDW: 17.2 % — ABNORMAL HIGH (ref 11.5–15.5)
WBC: 12.1 10*3/uL — ABNORMAL HIGH (ref 4.0–10.5)
nRBC: 0 % (ref 0.0–0.2)

## 2018-12-03 LAB — COMPREHENSIVE METABOLIC PANEL
ALT: 42 U/L (ref 0–44)
AST: 39 U/L (ref 15–41)
Albumin: 3.1 g/dL — ABNORMAL LOW (ref 3.5–5.0)
Alkaline Phosphatase: 121 U/L (ref 38–126)
Anion gap: 9 (ref 5–15)
BUN: 10 mg/dL (ref 6–20)
CO2: 30 mmol/L (ref 22–32)
Calcium: 9 mg/dL (ref 8.9–10.3)
Chloride: 97 mmol/L — ABNORMAL LOW (ref 98–111)
Creatinine, Ser: 0.53 mg/dL (ref 0.44–1.00)
GFR calc Af Amer: >60 mL/min (ref >60)
GFR calc non Af Amer: >60 mL/min (ref >60)
Glucose, Bld: 94 mg/dL (ref 70–99)
Potassium: 3.4 mmol/L — ABNORMAL LOW (ref 3.5–5.1)
Sodium: 136 mmol/L (ref 135–145)
Total Bilirubin: 0.3 mg/dL (ref 0.3–1.2)
Total Protein: 6.8 g/dL (ref 6.5–8.1)

## 2018-12-03 LAB — GLUCOSE, CAPILLARY
Glucose-Capillary: 110 mg/dL — ABNORMAL HIGH (ref 70–99)
Glucose-Capillary: 113 mg/dL — ABNORMAL HIGH (ref 70–99)
Glucose-Capillary: 114 mg/dL — ABNORMAL HIGH (ref 70–99)
Glucose-Capillary: 115 mg/dL — ABNORMAL HIGH (ref 70–99)
Glucose-Capillary: 118 mg/dL — ABNORMAL HIGH (ref 70–99)
Glucose-Capillary: 121 mg/dL — ABNORMAL HIGH (ref 70–99)

## 2018-12-03 LAB — MAGNESIUM: Magnesium: 2.3 mg/dL (ref 1.7–2.4)

## 2018-12-03 MED ORDER — POTASSIUM CHLORIDE 20 MEQ PO PACK
40.0000 meq | PACK | Freq: Once | ORAL | Status: AC
  Administered 2018-12-03: 12:00:00 40 meq via ORAL
  Filled 2018-12-03: qty 2

## 2018-12-03 MED ORDER — OXYMETAZOLINE HCL 0.05 % NA SOLN
1.0000 | Freq: Two times a day (BID) | NASAL | Status: DC | PRN
  Filled 2018-12-03: qty 15

## 2018-12-03 MED ORDER — OXYMETAZOLINE HCL 0.05 % NA SOLN
1.0000 | Freq: Two times a day (BID) | NASAL | Status: DC | PRN
  Administered 2018-12-03: 1 via NASAL
  Filled 2018-12-03: qty 15

## 2018-12-03 NOTE — Procedures (Signed)
Tracheostomy downsized to # 4 cuffless tracheostomy.  Well tolerated.   Kipp Brood, MD [facility] ICU Physician [place]  Pager: [phone] Mobile: [phone] After hours: [phone].  12/03/2018, 2:21 PM

## 2018-12-03 NOTE — Progress Notes (Signed)
OT Note - late entry  Pt agreeable to participate with OT/PT co-treatment session today. Pt became orthostatic sitting EOB with 20 pt drop in systolic pressure. Pt reclined and stated that she feels "anxious" and is "scared she will fall". Pt then talked about how she is having "bad dreams" about people "trying to hurt her". Pt able to verbalize that no one is trying to hurt her but doesn't understand why she feels this way. We discussed delirium/dreams/effects of medications and acknowledged her feelings/fear. Pt making excellent progress and will benefit from aggressive rehab at CIR. Message left for coordinator to review pt's chart.  Recommend pt get out of bed daily with nursing staff. Encourage work with theraband/squeeze ball and general ROM during vital checks. Blinds open during the day and update calendar in room for orientation.     12/02/18 1203  OT Visit Information  Last OT Received On 12/02/18  Assistance Needed +2  Reason for Co-Treatment Complexity of the patient's impairments (multi-system involvement);For patient/therapist safety  PT goals addressed during session Mobility/safety with mobility;Balance  History of Present Illness Pt adm 5/20 with hypoxic resp failure due to Covid 19. Pt intubated 5/20 and extubated 6/8, with re-intubation 6/8; Trach 6/11. PMH - none. 6/17 foul smelling trach site/secretions, elevated WBC and temp; vomited tube feeds.  Precautions  Precautions Fall  Precaution Comments monitor vitals; impulsive  Pain Assessment  Pain Assessment No/denies pain  Cognition  Arousal/Alertness Awake/alert  Behavior During Therapy Impulsive;Anxious  Overall Cognitive Status Impaired/Different from baseline  Area of Impairment Orientation;Attention;Memory;Safety/judgement;Awareness;Problem solving  Orientation Level Disoriented to;Time (but able to figure out with clues)  Current Attention Level Sustained;Selective (moments of selective)  Memory Decreased  short-term memory  Following Commands Follows one step commands consistently  Safety/Judgement Decreased awareness of safety;Decreased awareness of deficits  Awareness Emergent  Problem Solving Slow processing;Decreased initiation;Difficulty sequencing;Requires verbal cues  General Comments "I'm afraid I'm going to fall" Pt remains very anxious throughout session, can calm down with vc and reassurance, and then shortly will be anxious again.   Difficult to assess due to Tracheostomy  Upper Extremity Assessment  Upper Extremity Assessment Generalized weakness  RUE Deficits / Details R weaker than L. Poor eye hand coordination  Lower Extremity Assessment  Lower Extremity Assessment Defer to PT evaluation  ADL  Overall ADL's  Needs assistance/impaired  Eating/Feeding Minimal assistance;Sitting;Supervision/ safety  Grooming Minimal assistance;Oral care  Toileting - Clothing Manipulation Details (indicate cue type and reason) Currently using Purewick. Educated pt on need to [facility]  Functional mobility during ADLs +2 for physical assistance;Moderate assistance  Bed Mobility  Overal bed mobility Needs Assistance  Bed Mobility Rolling;Sidelying to Sit;Sit to Sidelying  Rolling Min assist  Sidelying to sit Min assist  Sit to sidelying Mod assist  General bed mobility comments 1st attempt pt came up from side to sit VERY quickly and immediately became anxious, reporting she felt she was going to pass out; ultimately returned to sidelying and on 2nd attempt, with cues pt came up much slower, less anxious, however required max cues to stay on task and for safety  Balance  Overall balance assessment Needs assistance  Sitting-balance support Bilateral upper extremity supported;Feet supported  Sitting balance-[name] Scale Fair  Sitting balance - Comments posterior lean at times; feel pt is trying to lay back down due to anxiety with breathing  Standing balance support Bilateral upper extremity  supported  Standing balance-[name] Scale Poor  Standing balance comment in stedy, only long enough to  secure stedy seat  Restrictions  Weight Bearing Restrictions No  Transfers  Overall transfer level Needs assistance  Transfer via [place] to/from Stand  Sit to [employer] safety/equipment (from EOB, elevated slightly to allow position of stedy)  General transfer comment 1st attempt, pt became too anxious had to remove stedy and return to sidelying; 2nd attempt given step-by-step instructions of what to expect and during process with better success  General Comments  General comments (skin integrity, edema, etc.) Upon initial sitting EOB BP, pt not orthostatic (BP increased); however after ~7 minutes BP reassessed and +orthostasis (>20 mmHg drop in SBP). After transfer to chair, pt saturating low 70s% with very slow increase to 80% (on 2L [place]); would briefly increase to 85% and then immmediately begin to decline to 79-80% and then lose a reliable waveform. New probe placed on finger with improved quality of waveform and pt remained 80-85% on 2L (after having rested in chair ~10 minutes). Incr to 4L  O2 and pt slowly increased to 90% and by the time PT exited room pt was 93%  OT - End of Session  Equipment Utilized During Treatment Oxygen  Activity Tolerance Patient tolerated treatment well  Patient left in chair;with call bell/phone within reach;with chair alarm set  Nurse Communication Mobility status;Need for lift equipment Charlaine Dalton)  OT Assessment/Plan  OT Plan Discharge plan remains appropriate  OT Visit Diagnosis Unsteadiness on feet (R26.81);Other abnormalities of gait and mobility (R26.89);Muscle weakness (generalized) (M62.81);Other symptoms and signs involving cognitive function  Pain - part of body  (general discomfort/dizziness)  OT Frequency (ACUTE ONLY) Min 3X/week  Recommendations for Other Services Rehab consult  Follow Up Recommendations  CIR;Supervision/Assistance - 24 hour  OT Equipment 3 in 1 bedside commode;Other (comment)  AM-PAC OT "6 Clicks" Daily Activity Outcome Measure (Version 2)  Help from another person eating meals? 3  Help from another person taking care of personal grooming? 3  Help from another person toileting, which includes using toliet, bedpan, or urinal? 2  Help from another person bathing (including washing, rinsing, drying)? 2  Help from another person to put on and taking off regular upper body clothing? 2  Help from another person to put on and taking off regular lower body clothing? 2  6 Click Score 14  OT Goal Progression  Progress towards OT goals Progressing toward goals  Acute Rehab OT Goals  Patient Stated Goal to get better and see her grand daughter  OT Goal Formulation With patient/family  Time For Goal Achievement 12/08/18  Maurie Boettcher, OT/L   Acute OT Clinical Specialist [place] Pager [phone] Office [phone]

## 2018-12-03 NOTE — Progress Notes (Signed)
OT Treatment Note  Pt complaining of a "bad taste" in her mouth. Realized pt was bleeding from her tongue. Pt pulled a large blood clot from her mouth and other pieces were suctioned out. Nsg aware and guaze applied with pressure. Bleeding eventually stopped and mouth cleaned. During episode, pt vomited/gagging on blood/bloody mucus. Able to progress to EOB, however, pt complaining of dizziness and nausea and reclined. Cleaned up from incontinent BM episode, although pt asking for bedpan. Pt placed in chair position in bed and completed session. Pt complaining of her R hand "not working". Pt does have decreased grip and pinch strength, affecting her fine motor/coordination skills. Feel this is most likely due to acquired ICU weakness rather than focal deficits. Pt issued activities to complete on her own to work on grip/pich strength and coordination.  May need to try TED hose to help progress mobility.  Pt making steady progress. Cognition is impaired but continues to improve.  Recommend CIR for rehab.     12/03/18 1800  OT Visit Information  Last OT Received On 12/03/18  Assistance Needed +2  PT/OT/SLP Co-Evaluation/Treatment Yes  Reason for Co-Treatment Complexity of the patient's impairments (multi-system involvement);To address functional/ADL transfers  OT goals addressed during session ADL's and self-care;Strengthening/ROM  History of Present Illness Pt adm 5/20 with hypoxic resp failure due to Covid 19. Pt intubated 5/20 and extubated 6/8, with re-intubation 6/8; Trach 6/11. PMH - none. 6/17 foul smelling trach site/secretions, elevated WBC and temp; vomited tube feeds.  Precautions  Precautions Fall  Precaution Comments monitor vitals; ck orthostatics  Pain Assessment  Pain Assessment Faces  Faces Pain Scale 4  Pain Location mouth  Pain Descriptors / Indicators Discomfort;Grimacing  Pain Intervention(s) Limited activity within patient's tolerance  Cognition  Arousal/Alertness  Awake/alert  Behavior During Therapy Anxious;Restless  Overall Cognitive Status Impaired/Different from baseline  Area of Impairment Orientation;Attention;Memory;Safety/judgement;Awareness;Problem solving  Orientation Level Disoriented to;Time  Current Attention Level Sustained;Selective  Memory Decreased short-term memory  Following Commands Follows one step commands consistently  Safety/Judgement Decreased awareness of safety;Decreased awareness of deficits  Awareness Emergent  Problem Solving Slow processing;Decreased initiation;Difficulty sequencing;Requires verbal cues  General Comments " I want to do this"; Continues to improve cognitively. more aware of her deficits; aware that she is having memory problems; Idfficulty with abstract thinking and higher level executive reasoning  Difficult to assess due to Tracheostomy  Upper Extremity Assessment  Upper Extremity Assessment Generalized weakness  RUE Deficits / Details impaired coordination but appears to be improving as pt gets stronger. Able to oppose all digits; # 3+/5 grip strength  RUE Coordination decreased fine motor;decreased gross motor  LUE Deficits / Details LUE overall stronger than R; Difficulty maintining grip on telephone; difficulty pusing buttons on phone  LUE Coordination decreased fine motor;decreased gross motor  Lower Extremity Assessment  Lower Extremity Assessment Defer to PT evaluation  ADL  Overall ADL's  Needs assistance/impaired  Eating/Feeding Set up;Supervision/ safety;Sitting (difficulty with opening packages; messy, but able to feed se)  Grooming Minimal assistance;Oral care  Grooming Details (indicate cue type and reason) assisted pt with oral hygiene after bleeding episode with tongue  Upper Body Bathing Minimal assistance;Sitting;Bed level  Lower Body Bathing Moderate assistance;Bed level  Lower Body Bathing Details (indicate cue type and reason) assist to clean [place] feet  Functional mobility  during ADLs Minimal assistance  General ADL Comments Pt progressed to EOB with min A +1. Pt fearful of falling and becoming dizzy.   Bed Mobility  Overal  bed mobility Needs Assistance  Bed Mobility Rolling;Sidelying to Sit  Rolling Min guard  Sidelying to sit Min assist  Supine to sit Min assist  General bed mobility comments Able to lift both legs back onto bed  Balance  Overall balance assessment Needs assistance  Sitting-balance support Bilateral upper extremity supported  Sitting balance-[name] Scale Fair  Sitting balance - Comments today was limited by nausea, need fto urinate and having had a bleed from tongue.  Transfers  General transfer comment NA due to nausea/dizziness and need to urinate  General Exercises - Upper Extremity  Shoulder Flexion AROM;Strengthening;Both;10 reps;Seated  Shoulder Extension Strengthening;Both;10 reps;Supine  Shoulder ABduction AROM;Strengthening;Both;10 reps;Seated  Shoulder Horizontal ABduction Self ROM;Both;15 reps  Elbow Flexion Strengthening;Both;15 reps;Seated  Elbow Extension Strengthening;Both;15 reps;Seated  Digit Composite Flexion Strengthening;Both;15 reps;Squeeze ball  Other Exercises  Other Exercises Pt issued coloring book to work in fine motor control and coordination  OT - End of Session  Equipment Utilized During Treatment Oxygen (4L)  Activity Tolerance Treatment limited secondary to medical complications (Comment);Patient tolerated treatment well (bleeding from tongue; nausea)  Patient left in bed;Other (comment) (chair position)  Nurse Communication Mobility status;Need for lift equipment;Other (comment) (blood in mouth)  OT Assessment/Plan  OT Plan Discharge plan remains appropriate  OT Visit Diagnosis Unsteadiness on feet (R26.81);Other abnormalities of gait and mobility (R26.89);Muscle weakness (generalized) (M62.81);Other symptoms and signs involving cognitive function  Pain - part of body  (mouth)  OT Frequency  (ACUTE ONLY) Min 3X/week  Recommendations for Other Services Rehab consult  Follow Up Recommendations CIR;Supervision/Assistance - 24 hour  OT Equipment 3 in 1 bedside commode;Other (comment)  AM-PAC OT "6 Clicks" Daily Activity Outcome Measure (Version 2)  Help from another person eating meals? 3  Help from another person taking care of personal grooming? 3  Help from another person toileting, which includes using toliet, bedpan, or urinal? 2  Help from another person bathing (including washing, rinsing, drying)? 3  Help from another person to put on and taking off regular upper body clothing? 2  Help from another person to put on and taking off regular lower body clothing? 2  6 Click Score 15  OT Goal Progression  Progress towards OT goals Progressing toward goals  Acute Rehab OT Goals  Patient Stated Goal to get better and see her grand daughter  OT Goal Formulation With patient/family  Time For Goal Achievement 12/08/18  Potential to Achieve Goals Good  ADL Goals  Pt Will Perform Grooming with min assist;sitting  Pt Will Transfer to Toilet stand pivot transfer;bedside commode;with mod assist  Pt Will Perform Toileting - Clothing Manipulation and hygiene with mod assist;sit to/from stand;sitting/lateral leans  Additional ADL Goal #1 Pt will perform bed mobility with Min A in preparation for ADLs  Additional ADL Goal #2 Pt will tolerate sitting at EOB for 10 minutes with Min Guard A in preparation for ADLs  Additional ADL Goal #3 Pt will demonstrate selective attention during ADL in a distracting environement with Min cues  Pt Will Perform Eating with modified independence  Pt Will Perform Upper Body Bathing with set-up;sitting  OT Time Calculation  OT Start Time (ACUTE ONLY) 1526  OT Stop Time (ACUTE ONLY) 1659  OT Time Calculation (min) 93 min  OT General Charges  $OT Visit 1 Visit  OT Treatments  $Self Care/Home Management  23-37 mins  $Neuromuscular Re-education 8-22 mins   Luisa DagoHilary Xara Paulding, OT/L   Acute OT Clinical Specialist Acute Rehabilitation Services Pager [phone] Office [phone]

## 2018-12-03 NOTE — Progress Notes (Signed)
PROGRESS NOTE                                                                                                                                                                                                             Patient Demographics:    Shannon Meyer, is a 53 y.o. female, DOB - 10-Nov-1965, VVK:[id number]  Outpatient Primary MD for the patient is No primary care provider on file.    LOS - 30  Admit date - 10/22/2018    Chief Complaint  Patient presents with   Weakness       Brief Narrative  Shannon Meyer is a 53 y.o. female with no significant medical history who presented with cough, weakness and shortness of breath found to have covid-19 with bilateral CXR opacities, intubated in ED and admitted to [facility] 5/20. She has had a prolonged hospital course. She was unable to wean from ventilator and underwent tracheostomy. She began developing fevers and found to have pseudomonas in her sputum. She is currently on antibiotics. Still has significant leukocytosis that is being monitored. Weaning off oxygen. Plan is for CIR/SNF/LTAC once stable.   Subjective:   Patient in bed, appears comfortable, denies any headache, no fever, no chest pain or pressure, no shortness of breath , no abdominal pain. No focal weakness.   Assessment  & Plan :     1. Acute Hypoxic Resp. Failure due to Acute Covid 19 Viral Pneumonitis during the ongoing 2020 Covid 19 Pandemic - she has completed remdesivir 5/21 - 5/30, received actemra 5/20, and 5/21. Received CTX 5/20-5/26, azithromycin 5/20-5/24. Needed to be intubated subsequently trached after she failed extubation on 11/10/2018, currently on trach collar.  Requiring 4 L oxygen per minute.  Continue supportive care. Will advance activity, PT, Med surg transfer.   ABG     Component Value Date/Time   PHART 7.424 11/22/2018 2116   PCO2ART 41.5 11/22/2018 2116   PO2ART 65.0 (L) 11/22/2018 2116   HCO3 27.1 11/22/2018 2116   TCO2 28 11/22/2018 2116   ACIDBASEDEF 1.0 10/24/2018 0419   O2SAT 92.0 11/22/2018 2116    COVID-19 Labs  No results for input(s): DDIMER, FERRITIN, LDH, CRP in the last 72 hours.  Lab Results  Component Value Date   SARSCOV2NAA POSITIVE (A) 10/22/2018     Hepatic Function Latest Ref Rng & Units 12/03/2018  12/02/2018 12/01/2018  Total Protein 6.5 - 8.1 g/dL 6.8 6.7 6.6  Albumin 3.5 - 5.0 g/dL 3.1(L) 3.0(L) 3.0(L)  AST 15 - 41 U/L 39 37 36  ALT 0 - 44 U/L 42 38 38  Alk Phosphatase 38 - 126 U/L 121 118 127(H)  Total Bilirubin 0.3 - 1.2 mg/dL 0.3 0.2(L) 0.4        Component Value Date/Time   BNP 89.5 11/28/2018 0300      Repeat COVID testing ordered for placement.  On 12/03/2018.   2.  Pseudomonas pneumonia -  Currently on Fortaz and clinically improving.  Continue for now.  Leukocytosis has improved and she is currently afebrile. Finished 7 days of treatment on 11/27/2018.  3.  ICU delirium, toxic encephalopathy.  Minimize sedatives and narcotics, Seroquel nightly, sitter if needed, restraints only if becomes danger to self or others by pulling lines or tubes.  Expose to sunlight, up in chair, PT. Much improved.  4.  Sinus tachycardia with questionable brief run of A. fib for a few minutes morning of 11/26/2018.  This would be a single isolated episode, no previous echo on file, she had stable TSH, currently on beta-blocker, unremarkable EKG, will place her on aspirin 81 mg, case discussed with cardiologist Dr. Marlou Porch who recommends no further change in treatment or plan, outpatient echocardiogram and cardiology follow-up.  Mali vas 2 score will be 3 .  5.  Hypokalemia.  Replaced orally will continue to monitor.  6. 1 Out of 4 blood culture contamination.  Stable monitor.  7.  Obesity with a BMI of 34.  Outpatient follow-up with PCP.  8.  Tongue laceration which was noted by nursing staff several days ago, likely while she was intubated.  Healing well.   Supportive care.  Outpatient ENT follow-up if needed.  Speech following.   9.  Tracheostomy induced dysphagia requiring tube feeds. Currently on PSMV valve, speech following and she underwent modified barium swallow, currently on dysphagia 3 diet and doing well clinically, hopefully she can be decannulated this admission if she stays stable.  Discussed with PCCM on 12/03/2018.   Condition - Fair  Family Communication  : daughter [name] called 11/28/18 @ 9.30 am   Code Status : Full code  Diet : Tube feeds  Diet Order            DIET DYS 3 Room service appropriate? Yes with Assist; Fluid consistency: Nectar Thick  Diet effective now               Disposition -require SNF.  Consults  :  PCCM  Procedures  :   5/20 Intubated in ED and admitted to [facility] ICU 5/21-5/26 Weaning FIO2 and PEEP, diuresing 5/26-6/3 Tolerating weaning for 1-2 hours 6/7 weaning on pressure support ventilation again this morning 10/5 6/8 extubated, reintubated (tachypnea, tachycardia) 6/12 Trached 6/13 Trach collar all day  6/14 bleeding from tracheostomy site 6/17 48 hours off vent, fever > pseudomonas pneumonia 6/20 slightly more awake, remains off vent 6/26 mentation much improved moved out of ICU 6/29 modified barium swallow due  PUD Prophylaxis : Famotidine   DVT Prophylaxis  :    Heparin    Lab Results  Component Value Date   PLT 348 12/03/2018    Inpatient Medications  Scheduled Meds:  aspirin EC  81 mg Oral Daily   chlorhexidine  15 mL Mouth/Throat BID   Chlorhexidine Gluconate Cloth  6 each Topical Daily   famotidine  20 mg Oral  QHS   feeding supplement (ENSURE ENLIVE)  237 mL Oral TID BM   heparin injection (subcutaneous)  7,500 Units Subcutaneous Q8H   insulin aspart  0-9 Units Subcutaneous Q4H   mouth rinse  15 mL Mouth Rinse QID   metoprolol tartrate  50 mg Oral BID   sodium chloride flush  10-40 mL Intracatheter Q12H   Continuous Infusions:   dextrose 75 mL/hr at 12/03/18 0531   PRN Meds:.acetaminophen, hydrALAZINE, ipratropium-albuterol, metoprolol tartrate, ondansetron (ZOFRAN) IV, polyethylene glycol, QUEtiapine, Resource ThickenUp Clear, sennosides  Antibiotics  :    Anti-infectives (From admission, onward)   Start     Dose/Rate Route Frequency Ordered Stop   11/21/18 2200  cefTAZidime (FORTAZ) 2 g in sodium chloride 0.9 % 100 mL IVPB     2 g 200 mL/hr over 30 Minutes Intravenous Every 8 hours 11/21/18 1426 11/28/18 0656   11/21/18 1400  cefTAZidime (FORTAZ) 2 g in sodium chloride 0.9 % 100 mL IVPB  Status:  Discontinued     2 g 200 mL/hr over 30 Minutes Intravenous Every 8 hours 11/21/18 1330 11/21/18 1426   11/20/18 0400  vancomycin (VANCOCIN) IVPB 1000 mg/200 mL premix  Status:  Discontinued     1,000 mg 200 mL/hr over 60 Minutes Intravenous Every 12 hours 11/19/18 1517 11/20/18 1344   11/19/18 1530  vancomycin (VANCOCIN) 1,500 mg in sodium chloride 0.9 % 500 mL IVPB     1,500 mg 250 mL/hr over 120 Minutes Intravenous  Once 11/19/18 1517 11/19/18 1744   11/19/18 1530  piperacillin-tazobactam (ZOSYN) IVPB 3.375 g     3.375 g 12.5 mL/hr over 240 Minutes Intravenous Every 8 hours 11/19/18 1517 11/21/18 1734   11/01/18 1000  fluconazole (DIFLUCAN) IVPB 100 mg     100 mg 50 mL/hr over 60 Minutes Intravenous Every 24 hours 11/01/18 0857 11/03/18 1137   10/27/18 2200  cefTRIAXone (ROCEPHIN) 2 g in sodium chloride 0.9 % 100 mL IVPB     2 g 200 mL/hr over 30 Minutes Intravenous Every 24 hours 10/27/18 0835 10/28/18 2243   10/24/18 1400  remdesivir 100 mg in sodium chloride 0.9 % 250 mL IVPB  Status:  Discontinued     100 mg over 30 Minutes Intravenous Every 24 hours 10/23/18 1153 10/24/18 1335   10/24/18 1400  remdesivir 100 mg in sodium chloride 0.9 % 220 mL IVPB  Status:  Discontinued     100 mg over 30 Minutes Intravenous Every 24 hours 10/24/18 1334 10/24/18 1336   10/24/18 1400  remdesivir 100 mg in sodium chloride  0.9 % 230 mL IVPB     100 mg over 30 Minutes Intravenous Every 24 hours 10/24/18 1335 11/01/18 1500   10/24/18 1400  remdesivir 100 mg in sodium chloride 0.9 % 230 mL IVPB  Status:  Discontinued     100 mg over 30 Minutes Intravenous Every 24 hours 10/24/18 1337 10/24/18 1339   10/23/18 1400  remdesivir 200 mg in sodium chloride 0.9 % 250 mL IVPB     200 mg over 30 Minutes Intravenous Once 10/23/18 1153 10/23/18 1738   10/22/18 2200  azithromycin (ZITHROMAX) 500 mg in sodium chloride 0.9 % 250 mL IVPB     500 mg 250 mL/hr over 60 Minutes Intravenous Every 24 hours 10/22/18 2148 10/26/18 2255   10/22/18 2200  cefTRIAXone (ROCEPHIN) 1 g in sodium chloride 0.9 % 100 mL IVPB  Status:  Discontinued     1 g 200 mL/hr over 30 Minutes Intravenous Every  24 hours 10/22/18 2148 10/27/18 0835       Time Spent in minutes  30   Lala Lund M.D on 12/03/2018 at 10:31 AM  To page go to [email or web address] - password [name]  [facility] -  Office  [phone]     See all Orders from today for further details    Objective:   Vitals:   12/03/18 0349 12/03/18 0814 12/03/18 0830 12/03/18 0839  BP: 128/83 126/71 126/71   Pulse: 83  97   Resp: (!) 27  (!) 23   Temp:  98.2 F (36.8 C)    TempSrc:  Oral    SpO2:   90% (!) 88%  Weight:      Height:        Wt Readings from Last 3 Encounters:  12/03/18 86.1 kg     Intake/Output Summary (Last 24 hours) at 12/03/2018 1031 Last data filed at 12/03/2018 0600 Gross per 24 hour  Intake 1632.1 ml  Output 900 ml  Net 732.1 ml     Physical Exam  Awake,  trach collar in place,  foley catheter, no focal deficits [place].AT,PERRAL Supple Neck,No JVD, No cervical lymphadenopathy appriciated.  Symmetrical Chest wall movement, Good air movement bilaterally, CTAB RRR,No Gallops, Rubs or new Murmurs, No Parasternal Heave +ve B.Sounds, Abd Soft, No tenderness, No organomegaly appriciated, No rebound - guarding or rigidity. No Cyanosis, Clubbing or  edema, No new Rash or bruise    Data Review:    CBC Recent Labs  Lab 11/29/18 0325 11/30/18 0645 12/01/18 0700 12/02/18 0610 12/03/18 0330  WBC 15.1* 16.9* 15.1* 11.8* 12.1*  HGB 11.6* 11.8* 11.9* 11.8* 11.8*  HCT 39.6 40.0 39.8 39.0 39.9  PLT 398 347 345 334 348  MCV 98.8 96.9 96.8 96.1 97.1  MCH 28.9 28.6 29.0 29.1 28.7  MCHC 29.3* 29.5* 29.9* 30.3 29.6*  RDW 18.1* 17.8* 17.6* 17.6* 17.2*  LYMPHSABS 3.6 4.1* 4.4* 3.2 4.0  MONOABS 1.3* 1.5* 1.4* 1.4* 1.9*  EOSABS 0.8* 0.6* 0.5 0.6* 0.7*  BASOSABS 0.1 0.1 0.1 0.1 0.1    Chemistries  Recent Labs  Lab 11/29/18 0325 11/30/18 0645 12/01/18 0700 12/02/18 0610 12/03/18 0330  NA 146* 137 138 138 136  K 3.8 4.7 3.8 3.5 3.4*  CL 97* 97* 97* 97* 97*  CO2 '31 26 31 29 30  ' GLUCOSE 108* 92 117* 113* 94  BUN 22* 21* '12 11 10  ' CREATININE 0.45 0.54 0.41* 0.56 0.53  CALCIUM 9.6 8.8* 9.3 9.1 9.0  MG 2.4 2.6* 2.2 2.2 2.3  AST 36 57* 36 37 39  ALT 38 38 38 38 42  ALKPHOS 131* 126 127* 118 121  BILITOT 0.1* 0.7 0.4 0.2* 0.3   ------------------------------------------------------------------------------------------------------------------ No results for input(s): CHOL, HDL, LDLCALC, TRIG, CHOLHDL, LDLDIRECT in the last 72 hours.  No results found for: HGBA1C ------------------------------------------------------------------------------------------------------------------ No results for input(s): TSH, T4TOTAL, T3FREE, THYROIDAB in the last 72 hours.  Invalid input(s): FREET3  Lab Results  Component Value Date   TSH 1.034 11/19/2018    Cardiac Enzymes No results for input(s): CKMB, TROPONINI, MYOGLOBIN in the last 168 hours.  Invalid input(s): CK ------------------------------------------------------------------------------------------------------------------    Component Value Date/Time   BNP 89.5 11/28/2018 0300    Micro Results Recent Results (from the past 240 hour(s))  MRSA PCR Screening     Status: None    Collection Time: 11/28/18  2:00 PM   Specimen: Nasal Mucosa; Nasopharyngeal  Result Value Ref Range Status   MRSA by PCR  NEGATIVE NEGATIVE Final    Comment:        The GeneXpert MRSA Assay (FDA approved for NASAL specimens only), is one component of a comprehensive MRSA colonization surveillance program. It is not intended to diagnose MRSA infection nor to guide or monitor treatment for MRSA infections. Performed at [facility], [place] [address], [place], [place] [address]     Radiology Reports Dg Abd 1 View  Result Date: 11/10/2018 CLINICAL DATA:  Orogastric placement EXAM: ABDOMEN - 1 VIEW COMPARISON:  None. FINDINGS: Orogastric tube enters the stomach, loops in the body and has its tip in the antrum. Bowel gas pattern appears normal. IMPRESSION: Orogastric tip in the antrum. Electronically Signed   By: Nelson Chimes M.D.   On: 11/10/2018 16:50   Dg Chest Port 1 View  Result Date: 11/25/2018 CLINICAL DATA:  ARDS, COVID-19 positive EXAM: PORTABLE CHEST 1 VIEW COMPARISON:  Portable exam 0508 hours compared to 11/19/2018 FINDINGS: Tracheostomy tube unchanged. Tip of RIGHT arm PICC line projects over cavoatrial junction. Feeding tube extends to at least the second portion of the duodenum. Stable heart size. Diffuse BILATERAL airspace infiltrates, unchanged. No pleural effusion or pneumothorax. IMPRESSION: Persistent diffuse BILATERAL airspace infiltrates Electronically Signed   By: Lavonia Dana M.D.   On: 11/25/2018 08:08   Dg Chest Port 1 View  Result Date: 11/19/2018 CLINICAL DATA:  Fever EXAM: PORTABLE CHEST 1 VIEW COMPARISON:  11/13/2018, 11/11/2018 FINDINGS: Tracheostomy tube in satisfactory position. Feeding tube coiled in the stomach. Right-sided PICC line with the tip projecting over the SVC. Worsening bilateral patchy interstitial and alveolar airspace disease throughout bilateral lungs concerning for multilobar pneumonia. No pleural effusion or pneumothorax. Stable  cardiomediastinal silhouette. No aggressive osseous lesion. IMPRESSION: 1. Support lines and tubing in satisfactory position. 2. Worsening bilateral patchy interstitial and alveolar airspace disease throughout bilateral lungs concerning for multilobar pneumonia. Electronically Signed   By: Kathreen Devoid   On: 11/19/2018 13:38   Dg Chest Port 1 View  Result Date: 11/13/2018 CLINICAL DATA:  Tracheostomy placement.  COVID-19. EXAM: PORTABLE CHEST 1 VIEW COMPARISON:  11/11/2018. FINDINGS: Tracheostomy is been inserted. Tip lies 4.5 cm above carina. BILATERAL pulmonary opacities persist, LEFT greater than RIGHT, not significantly improved. Cardiomegaly. Feeding tube duodenum. IMPRESSION: Satisfactory tracheostomy placement. No significant improvement aeration. Electronically Signed   By: Staci Righter M.D.   On: 11/13/2018 16:16   Dg Chest Port 1 View  Result Date: 11/11/2018 CLINICAL DATA:  Follow-up aspiration EXAM: PORTABLE CHEST 1 VIEW COMPARISON:  11/10/2018 FINDINGS: Cardiac shadow is stable. Endotracheal tube and gastric catheter are noted in satisfactory position. Right-sided PICC line is noted at the cavoatrial junction. Patchy infiltrates are again seen bilaterally stable from the prior exam. No pneumothorax is noted. IMPRESSION: No change from the previous day. Electronically Signed   By: Inez Catalina M.D.   On: 11/11/2018 07:52   Dg Chest Port 1 View  Result Date: 11/10/2018 CLINICAL DATA:  Intubation.  OG tube placement. EXAM: PORTABLE CHEST 1 VIEW COMPARISON:  November 09, 2018 FINDINGS: The ETT remains in good position, 2 cm above the carina. Bilateral pulmonary infiltrates remain, similar on the left and more prominent on the right in the interval. The OG tube terminates below today's film. No other changes. IMPRESSION: Support apparatus as above. Bilateral pulmonary infiltrates are stable on the left and mildly more prominent on the right in the interval. Electronically Signed   By: Dorise Bullion  III M.D   On: 11/10/2018 16:27  Dg Chest Port 1 View  Result Date: 11/09/2018 CLINICAL DATA:  53 year old female positive COVID-19 status post PICC placement. EXAM: PORTABLE CHEST 1 VIEW COMPARISON:  Chest radiograph dated 11/04/2018 FINDINGS: A right-sided PICC is noted with tip over the right subclavian vein. Recommend further advancing by an additional approximately 10 cm. Endotracheal tube remains above the carina in similar position. Enteric tube extends below the diaphragm likely within the stomach. Interval progression of bilateral confluent densities since the prior radiograph. No large pleural effusion. There is no pneumothorax. No acute osseous pathology. Stable cardiac silhouette. IMPRESSION: 1. Right-sided PICC with tip over the right subclavian vein. Recommend further advancing an additional 10 cm. 2. Interval progression of bilateral confluent densities. Electronically Signed   By: Anner Crete M.D.   On: 11/09/2018 03:25   Dg Chest Port 1 View  Result Date: 11/04/2018 CLINICAL DATA:  Acute respiratory failure EXAM: PORTABLE CHEST 1 VIEW COMPARISON:  10/31/2018 FINDINGS: Cardiac shadow is stable. Endotracheal tube, right-sided PICC line and gastric catheter are noted in satisfactory position. Multifocal infiltrates are identified bilaterally left greater than right stable from the prior exam. No bony abnormality is seen. IMPRESSION: Stable bilateral infiltrates Tubes and lines in satisfactory position. Electronically Signed   By: Inez Catalina M.D.   On: 11/04/2018 07:01   Dg Abd Portable 1v  Result Date: 11/29/2018 CLINICAL DATA:  Dysphagia EXAM: PORTABLE ABDOMEN - 1 VIEW COMPARISON:  Plain film of the abdomen dated 11/21/2018. FINDINGS: Enteric tube appears adequately positioned in the stomach. Visualized bowel gas pattern is nonobstructive. No evidence of free intraperitoneal air. IMPRESSION: Enteric tube appears adequately positioned in the stomach. Electronically Signed   By: Franki Cabot M.D.   On: 11/29/2018 11:29   Dg Abd Portable 1v  Result Date: 11/21/2018 CLINICAL DATA:  NG tube placement. EXAM: PORTABLE ABDOMEN - 1 VIEW COMPARISON:  Plain films of the abdomen dated 11/20/2018 and 11/19/2018. FINDINGS: Weighted tip feeding tube now appears appropriately positioned with tip in the proximal duodenum. Visualized bowel gas pattern is nonobstructive. IMPRESSION: Weighted tip feeding tube now appears appropriately positioned with tip in the proximal duodenum. Electronically Signed   By: Franki Cabot M.D.   On: 11/21/2018 12:24   Dg Abd Portable 1v  Result Date: 11/20/2018 CLINICAL DATA:  Encounter for NG tube placement EXAM: PORTABLE ABDOMEN - 1 VIEW COMPARISON:  11/19/2018 FINDINGS: Feeding tube coils in the stomach with the tip in the fundus. Nonobstructive bowel gas pattern. No organomegaly or free air. IMPRESSION: Feeding tube coils in the stomach with the tip in the fundus. Electronically Signed   By: Rolm Baptise M.D.   On: 11/20/2018 19:57   Dg Abd Portable 1v  Result Date: 11/19/2018 CLINICAL DATA:  Vomiting. EXAM: PORTABLE ABDOMEN - 1 VIEW COMPARISON:  None. FINDINGS: A feeding tube coils in the stomach with the distal tip in the fundus. No evidence of bowel obstruction. IMPRESSION: A feeding tube coils in the stomach. No evidence of bowel obstruction. No cause for vomiting noted. Electronically Signed   By: Dorise Bullion III M.D   On: 11/19/2018 16:43   Dg Swallowing Func-speech Pathology  Result Date: 12/01/2018 Objective Swallowing Evaluation: Type of Study: MBS-Modified Barium Swallow Study  Patient Details Name: [place] Dalporto MRN: [id number] Date of Birth: April 28, 1966 Today's Date: 12/01/2018 Time: SLP Start Time (ACUTE ONLY): 1050 -SLP Stop Time (ACUTE ONLY): 1122 SLP Time Calculation (min) (ACUTE ONLY): 32 min Past Medical History: No past medical history on file. Past Surgical History: No  past surgical history on file. HPI: Pt adm 5/20 with hypoxic resp failure  due to Covid 19. Pt intubated 5/20 and extubated 6/8, with re-intubation 6/8; Trach 6/11. PMH - none.  Subjective: alert, distractible Assessment / Plan / Recommendation CHL IP CLINICAL IMPRESSIONS 12/01/2018 Clinical Impression Pt demonstrates moderate oropharyngeal dysphagia following prolonged critical illness and intubation. 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 PMSV in place throught, pt on 2 liters [place]. Pt has relatively good oral function despite lingual wound that is healing well. LIngual manipulation for mastication, bolus formation and propulsion appears a little slow but adequate. However, premature spillage and delayed swallow initiation do lead to prolonged pooling of liquids in pyriform sinsues prior to swallow initiation. WIth thin liquids penetration and aspiration via the postirior commisure is failry consistent, no response from pt but a cued cough ejects. Chin tuck not beneficial. Nectar thick liquids and solids were not aspirated but there was mild to moderate diffuse pharyngeal residual suggesting mild generalized muscle weakness. Pt able to follow cue to swallow twice. Recommend a dys 3 mech soft) diet with nectar thick liquids and intermittent instruction to swallow again. Will follow for tolerance.  SLP Visit Diagnosis Dysphagia, oropharyngeal phase (R13.12) Attention and concentration deficit following -- Frontal lobe and executive function deficit following -- Impact on safety and function Moderate aspiration risk   CHL IP TREATMENT RECOMMENDATION 12/01/2018 Treatment Recommendations Therapy as outlined in treatment plan below   Prognosis 12/01/2018 Prognosis for Safe Diet Advancement Good Barriers to Reach Goals Cognitive deficits Barriers/Prognosis Comment -- CHL IP DIET RECOMMENDATION 12/01/2018 SLP Diet Recommendations Dysphagia 3 (Mech soft) solids;Nectar thick liquid Liquid Administration via Cup;Straw Medication  Administration Whole meds with liquid Compensations Slow rate;Small sips/bites;Follow solids with liquid;Multiple dry swallows after each bite/sip Postural Changes Seated upright at 90 degrees   CHL IP OTHER RECOMMENDATIONS 12/01/2018 Recommended Consults -- Oral Care Recommendations Oral care BID Other Recommendations Order thickener from pharmacy   CHL IP FOLLOW UP RECOMMENDATIONS 12/01/2018 Follow up Recommendations Inpatient Rehab   CHL IP FREQUENCY AND DURATION 12/01/2018 Speech Therapy Frequency (ACUTE ONLY) min 2x/week Treatment Duration 2 weeks      CHL IP ORAL PHASE 12/01/2018 Oral Phase WFL Oral - Pudding Teaspoon -- Oral - Pudding Cup -- Oral - Honey Teaspoon -- Oral - Honey Cup -- Oral - Nectar Teaspoon -- Oral - Nectar Cup -- Oral - Nectar Straw -- Oral - Thin Teaspoon -- Oral - Thin Cup -- Oral - Thin Straw -- Oral - Puree -- Oral - Mech Soft -- Oral - Regular -- Oral - Multi-Consistency -- Oral - Pill -- Oral Phase - Comment --  CHL IP PHARYNGEAL PHASE 12/01/2018 Pharyngeal Phase Impaired Pharyngeal- Pudding Teaspoon -- Pharyngeal -- Pharyngeal- Pudding Cup -- Pharyngeal -- Pharyngeal- Honey Teaspoon -- Pharyngeal -- Pharyngeal- Honey Cup -- Pharyngeal -- Pharyngeal- Nectar Teaspoon -- Pharyngeal -- Pharyngeal- Nectar Cup Delayed swallow initiation-pyriform sinuses;Reduced tongue base retraction;Reduced laryngeal elevation;Reduced anterior laryngeal mobility;Reduced epiglottic inversion;Reduced pharyngeal peristalsis;Pharyngeal residue - valleculae;Pharyngeal residue - pyriform Pharyngeal -- Pharyngeal- Nectar Straw Delayed swallow initiation-pyriform sinuses;Reduced tongue base retraction;Reduced laryngeal elevation;Reduced anterior laryngeal mobility;Reduced epiglottic inversion;Reduced pharyngeal peristalsis;Pharyngeal residue - valleculae;Pharyngeal residue - pyriform Pharyngeal -- Pharyngeal- Thin Teaspoon -- Pharyngeal -- Pharyngeal- Thin Cup Delayed swallow initiation-pyriform sinuses;Reduced  airway/laryngeal closure;Penetration/Aspiration during swallow;Trace aspiration;Pharyngeal residue - pyriform Pharyngeal Material enters airway, CONTACTS cords and not ejected out;Material enters airway, passes BELOW cords without attempt by patient to eject out (silent aspiration) Pharyngeal-  Thin Straw Delayed swallow initiation-pyriform sinuses;Reduced airway/laryngeal closure;Penetration/Aspiration during swallow;Trace aspiration;Pharyngeal residue - pyriform;Compensatory strategies attempted (with notebox) Pharyngeal Material enters airway, passes BELOW cords without attempt by patient to eject out (silent aspiration);Material enters airway, CONTACTS cords and not ejected out Pharyngeal- Puree Delayed swallow initiation-pyriform sinuses;Reduced tongue base retraction;Reduced laryngeal elevation;Reduced anterior laryngeal mobility;Reduced epiglottic inversion;Reduced pharyngeal peristalsis;Pharyngeal residue - valleculae;Pharyngeal residue - pyriform Pharyngeal -- Pharyngeal- Mechanical Soft -- Pharyngeal -- Pharyngeal- Regular Delayed swallow initiation-pyriform sinuses;Reduced tongue base retraction;Reduced laryngeal elevation;Reduced anterior laryngeal mobility;Reduced epiglottic inversion;Reduced pharyngeal peristalsis;Pharyngeal residue - valleculae;Pharyngeal residue - pyriform Pharyngeal -- Pharyngeal- Multi-consistency -- Pharyngeal -- Pharyngeal- Pill -- Pharyngeal -- Pharyngeal Comment --  No flowsheet data found. DeBlois, Katherene Ponto 12/01/2018, 12:02 PM              [place] Ekg Site Rite  Result Date: 11/09/2018 If [facility] Cam image not attached, placement could not be confirmed due to current cardiac rhythm.

## 2018-12-03 NOTE — Progress Notes (Signed)
Physical Therapy Treatment Patient Details Name: Shannon Meyer MRN: [id number] DOB: [birth year] Today's Date: 12/03/2018    History of Present Illness Pt adm 5/20 with hypoxic resp failure due to Covid 19. Pt intubated 5/20 and extubated 6/8, with re-intubation 6/8; Trach 6/11. PMH - none. 6/17 foul smelling trach site/secretions, elevated WBC and temp; vomited tube feeds.    PT Comments    The patient had trach recently changed. Patient reports having bad taste, salty, then noted to cough up clotted blood, periods of heaving. RN in room. Patient's mouth suctioned and determined bleeding from underneath tongue. Patient had a tongue laceration since  Admission.   Assisted to sitting at bed edge, C/o dizziness. BP 126/64. Patient returned to supine for use of bed pan. Patient left in chair position with OT. Continue PT. Attempt OOB  Next visit using RW.   Follow Up Recommendations   CIR-excellent candidate     Equipment Recommendations    TBA   Recommendations for Other Services       Precautions / Restrictions Precautions Precautions: Fall Precaution Comments: monitor vitals; ck orthostatics    Mobility  Bed Mobility Overal bed mobility: Needs Assistance Bed Mobility: Rolling;Sidelying to Sit;Sit to Sidelying Rolling: Min assist;Min guard Sidelying to sit: Mod assist     Sit to sidelying: Min assist General bed mobility comments: multimodal cues for sitting up from sidelying. then back to sidelying and supine due to need for bedpan  Transfers                 General transfer comment: NT due to so much other going on with pt.  Ambulation/Gait                 Stairs             Wheelchair Mobility    Modified Rankin (Stroke Patients Only)       Balance Overall balance assessment: Needs assistance Sitting-balance support: Bilateral upper extremity supported;Feet supported Sitting balance-[name] Scale: Poor Sitting balance - Comments: today was  prooccupied by nausea, need fto urinate and having had a bleed from tongue.                                    Cognition Arousal/Alertness: Awake/alert Behavior During Therapy: Anxious;Restless Overall Cognitive Status: Impaired/Different from baseline Area of Impairment: Orientation;Attention;Memory;Safety/judgement;Awareness;Problem solving                 Orientation Level: Disoriented to;Time Current Attention Level: Sustained;Selective Memory: Decreased short-term memory Following Commands: Follows one step commands consistently Safety/Judgement: Decreased awareness of safety;Decreased awareness of deficits Awareness: Emergent   General Comments: " I want to do this"      Exercises      General Comments        Pertinent Vitals/Pain Pain Assessment: No/denies pain    Home Living                      Prior Function            PT Goals (current goals can now be found in the care plan section)      Frequency           PT Plan      Co-evaluation PT/OT/SLP Co-Evaluation/Treatment: Yes Reason for Co-Treatment: For patient/therapist safety PT goals addressed during session: Mobility/safety with mobility        AM-PAC PT "6  Clicks" Mobility   Outcome Measure                   End of Session Equipment Utilized During Treatment: Oxygen;Gait belt Activity Tolerance: Patient limited by fatigue;Treatment limited secondary to medical complications (Comment)(after trach changed by MD, bleeding from tongue)           Time: [id number] PT Time Calculation (min) (ACUTE ONLY): 60 min  Charges:  $Therapeutic Activity: 23-37 mins                     Blanchard KelchKaren Miriana Gaertner PT Acute Rehabilitation Services Pager [phone] Office [phone]    Rada HayHill, Jarelis Ehlert Elizabeth 12/03/2018, 4:52 PM

## 2018-12-03 NOTE — Progress Notes (Signed)
NAME:  Shannon Meyer, MRN:  [id number], DOB:  September 10, 1965, LOS: 50 ADMISSION DATE:  10/22/2018, CONSULTATION DATE:  10/22/2018 REFERRING MD:  Sedonia Small, CHIEF COMPLAINT:  Dyspnea   Brief History   53 year old female with no past medical history admitted from [place] on Oct 22, 2018 due to ARDS from COVID-19 pneumonia.  Has had a prolonged hospitalization, required tracheostomy.  Liberated from the ventilator on 6/15.   Past Medical History  none  Significant Hospital Events   5/20 Intubated in ED and admitted to [facility] ICU 5/21-5/26 Weaning FIO2 and PEEP, diuresing 5/26-6/3 Tolerating weaning for 1-2 hours 6/7 weaning on pressure support ventilation again this morning 10/5 6/8 extubated, reintubated (tachypnea, tachycardia) 6/12 Trached 6/13 Trach collar all day  6/14 bleeding from tracheostomy site 6/17 48 hours off vent, fever > pseudomonas pneumonia 6/20 slightly more awake, remains off vent 6/26 transferred to med-surg floor  Consults:  PCCM  Procedures:  May 20 endotracheal tube> June 11 Tracheostomy June 11>  June 7 PICC line right arm>   Significant Diagnostic Tests:  Oct 28, 2018 CT angiogram chest no pulmonary embolism, bilateral atypical pneumonia consistent with COVID-19 CXR 6/23 - improving infiltrates  Micro Data:  May 20 SARS-CoV-2 positive May 20 blood culture> 1 out of 2 GPC coag negative staph June 17 respiratory culture> pseudomonas aeruginosa  Antimicrobials:  Zithromax 5/20 >>5/24 Rocephin 5/20 >>5/26 Remdesivir 5/21 >5/30 Actemra 5/20>5/21 Solumedrol 5/12 >5/26  Vanc 6/17 > x1 Zosyn 6/17 > 6/19 Ceftaz 6/17 > 6/25  Interim history/subjective:   Pulled out NG Swallow pills yesterday Breathing is comfortable Has not participated in physical therapy in 4 days No respiratory distress and tolerating oral intake without coughing.  Objective   Blood pressure 119/65, pulse 82, temperature 98.4 F (36.9 C),  temperature source Oral, resp. rate 20, height 5\' 4"  (1.626 m), weight 86.1 kg, SpO2 95 %.    FiO2 (%):  [100 %] 100 %   Intake/Output Summary (Last 24 hours) at 12/03/2018 1343 Last data filed at 12/03/2018 1200 Gross per 24 hour  Intake 2082.1 ml  Output 1300 ml  Net 782.1 ml   Filed Weights   12/01/18 0410 12/02/18 0500 12/03/18 0330  Weight: 89 kg 89 kg 86.1 kg    Examination: General:  Resting comfortably in bed HENT: NCAT tracheostomy site clean, dry, intact PULM: CTA B, normal effort CV: RRR, no mgr GI: BS+, soft, nontender MSK: normal bulk and tone Neuro: awake, alert, no distress, MAEW    Resolved Hospital Problem list     Assessment & Plan:  ARDS due to COVID-19 pneumonia: Resolving Continue to wean off oxygen Continue to encourage mobility.  Tracheostomy status: Continue trach care per routine Continue speaking valve Speech evaluation for swallowing today Will downsize to 4 cuffless tracheostomy. Begin capping trial tomorrow.  Pseudomonas pneumonia: Monitor off of ceftaz   Best practice:  Diet: advance Pain/Anxiety/Delirium protocol (if indicated): n/a VAP protocol (if indicated): n/a DVT prophylaxis: sub q hep GI prophylaxis: n/a Glucose control: per TRH Mobility: out of bed, PT consult Code Status: full Family Communication:  Per TRH Disposition: consider [facility]  Labs   CBC: Recent Labs  Lab 11/29/18 0325 11/30/18 0645 12/01/18 0700 12/02/18 0610 12/03/18 0330  WBC 15.1* 16.9* 15.1* 11.8* 12.1*  NEUTROABS 9.2* 10.4* 8.5* 6.4 5.3  HGB 11.6* 11.8* 11.9* 11.8* 11.8*  HCT 39.6 40.0 39.8 39.0 39.9  MCV 98.8 96.9 96.8 96.1 97.1  PLT 398 347 345  334 348    Basic Metabolic Panel: Recent Labs  Lab 11/29/18 0325 11/30/18 0645 12/01/18 0700 12/02/18 0610 12/03/18 0330  NA 146* 137 138 138 136  K 3.8 4.7 3.8 3.5 3.4*  CL 97* 97* 97* 97* 97*  CO2 31 26 31 29 30   GLUCOSE 108* 92 117* 113* 94  BUN 22* 21* 12 11 10    CREATININE 0.45 0.54 0.41* 0.56 0.53  CALCIUM 9.6 8.8* 9.3 9.1 9.0  MG 2.4 2.6* 2.2 2.2 2.3   GFR: Estimated Creatinine Clearance: 84.4 mL/min (by C-G formula based on SCr of 0.53 mg/dL). Recent Labs  Lab 11/30/18 0645 12/01/18 0700 12/02/18 0610 12/03/18 0330  WBC 16.9* 15.1* 11.8* 12.1*    Liver Function Tests: Recent Labs  Lab 11/29/18 0325 11/30/18 0645 12/01/18 0700 12/02/18 0610 12/03/18 0330  AST 36 57* 36 37 39  ALT 38 38 38 38 42  ALKPHOS 131* 126 127* 118 121  BILITOT 0.1* 0.7 0.4 0.2* 0.3  PROT 6.6 6.5 6.6 6.7 6.8  ALBUMIN 2.9* 2.9* 3.0* 3.0* 3.1*   No results for input(s): LIPASE, AMYLASE in the last 168 hours. No results for input(s): AMMONIA in the last 168 hours.  ABG    Component Value Date/Time   PHART 7.424 11/22/2018 2116   PCO2ART 41.5 11/22/2018 2116   PO2ART 65.0 (L) 11/22/2018 2116   HCO3 27.1 11/22/2018 2116   TCO2 28 11/22/2018 2116   ACIDBASEDEF 1.0 10/24/2018 0419   O2SAT 92.0 11/22/2018 2116     Coagulation Profile: No results for input(s): INR, PROTIME in the last 168 hours.  Cardiac Enzymes: No results for input(s): CKTOTAL, CKMB, CKMBINDEX, TROPONINI in the last 168 hours.  HbA1C: No results found for: HGBA1C  CBG: Recent Labs  Lab 12/02/18 2003 12/03/18 0005 12/03/18 0328 12/03/18 0812 12/03/18 1116  GLUCAP 102* 115* 113* 114* 121*     Critical care time: n/a    Lynnell Catalanavi Dekisha Mesmer, MD [facility] ICU Physician [facility]  Pager: [phone] Mobile: [phone] After hours: [phone].  12/03/2018, 1:46 PM

## 2018-12-04 LAB — PROTIME-INR
INR: 1 (ref 0.8–1.2)
Prothrombin Time: 13 seconds (ref 11.4–15.2)

## 2018-12-04 LAB — NOVEL CORONAVIRUS, NAA (HOSP ORDER, SEND-OUT TO REF LAB; TAT 18-24 HRS): SARS-CoV-2, NAA: NOT DETECTED

## 2018-12-04 LAB — GLUCOSE, CAPILLARY
Glucose-Capillary: 108 mg/dL — ABNORMAL HIGH (ref 70–99)
Glucose-Capillary: 113 mg/dL — ABNORMAL HIGH (ref 70–99)
Glucose-Capillary: 114 mg/dL — ABNORMAL HIGH (ref 70–99)
Glucose-Capillary: 120 mg/dL — ABNORMAL HIGH (ref 70–99)

## 2018-12-04 MED ORDER — PRO-STAT SUGAR FREE PO LIQD
30.0000 mL | Freq: Three times a day (TID) | ORAL | Status: DC
  Administered 2018-12-04 – 2018-12-09 (×12): 30 mL via ORAL
  Filled 2018-12-04 (×12): qty 30

## 2018-12-04 NOTE — Progress Notes (Signed)
  Speech Language Pathology Treatment: Dysphagia;Cognitive-Linquistic;Passy Damita Lack Speaking valve  Patient Details Name: Shannon Meyer MRN: [id number] DOB: [birth year] Today's Date: 12/04/2018 Time: 1027-2536 SLP Time Calculation (min) (ACUTE ONLY): 30 min  Assessment / Plan / Recommendation Clinical Impression  Pt drinking ensure upon arrival, vocal quality still mildly hoarse, but breath support so much better after trach change to 4 cuffless. Pt tolerated ensure, thin water and thin coke without any signs of aspiration through session. Will upgrade liquids to thin but keep solids mech soft given pt decreased hand strength to cut foods and still healing lingual injury. Pt participated in basic orientation and memory tasks, supplied extensive verbal reasoning with improving awareness of situation.   HPI HPI: Pt adm 5/20 with hypoxic resp failure due to Covid 19. Pt intubated 5/20 and extubated 6/8, with re-intubation 6/8; Trach 6/11. PMH - none.       SLP Plan  Continue with current plan of care       Recommendations  Diet recommendations: Dysphagia 3 (mechanical soft);Thin liquid Liquids provided via: Cup;Straw Medication Administration: Whole meds with puree Supervision: Patient able to self feed;Intermittent supervision to cue for compensatory strategies Compensations: Slow rate;Small sips/bites;Follow solids with liquid;Multiple dry swallows after each bite/sip Postural Changes and/or Swallow Maneuvers: Seated upright 90 degrees                General recommendations: Rehab consult Oral Care Recommendations: Oral care BID Follow up Recommendations: Inpatient Rehab SLP Visit Diagnosis: Dysphagia, oropharyngeal phase (R13.12) Plan: Continue with current plan of care       GO               Herbie Baltimore, MA [place] Pager [phone] Office [phone]  [name] 12/04/2018, 1:40 PM

## 2018-12-04 NOTE — Progress Notes (Signed)
PROGRESS NOTE                                                                                                                                                                                                             Patient Demographics:    Shannon Meyer, is a 53 y.o. female, DOB - [birth year], WGN:[id number]  Outpatient Primary MD for the patient is No primary care provider on file.    LOS - 54  Admit date - 10/22/2018    Chief Complaint  Patient presents with   Weakness       Brief Narrative  Shannon Meyer is a 53 y.o. female with no significant medical history who presented with cough, weakness and shortness of breath found to have covid-19 with bilateral CXR opacities, intubated in ED and admitted to [facility] 5/20. She has had a prolonged hospital course. She was unable to wean from ventilator and underwent tracheostomy. She began developing fevers and found to have pseudomonas in her sputum. She is currently on antibiotics. Still has significant leukocytosis that is being monitored. Weaning off oxygen. Plan is for CIR/SNF/LTAC once stable.   Subjective:   Patient in bed, appears comfortable, denies any headache, no fever, no chest pain or pressure, no shortness of breath , no abdominal pain. No focal weakness.   Assessment  & Plan :     1. Acute Hypoxic Resp. Failure due to Acute Covid 19 Viral Pneumonitis during the ongoing 2020 Covid 19 Pandemic - she has completed remdesivir 5/21 - 5/30, received actemra 5/20, and 5/21. Received CTX 5/20-5/26, azithromycin 5/20-5/24. Needed to be intubated subsequently trached after she failed extubation on 11/10/2018, currently on 2 L nasal cannula oxygen with a cuff less size for trach which was placed by pulmonary critical care on 12/03/2018.  Clinically much improved, continue supportive care. Will advance activity, PT, Med surg transfer.  Repeat COVID test for placement ordered on  12/03/2018  ABG     Component Value Date/Time   PHART 7.424 11/22/2018 2116   PCO2ART 41.5 11/22/2018 2116   PO2ART 65.0 (L) 11/22/2018 2116   HCO3 27.1 11/22/2018 2116   TCO2 28 11/22/2018 2116   ACIDBASEDEF 1.0 10/24/2018 0419   O2SAT 92.0 11/22/2018 2116    COVID-19 Labs  No results for input(s): DDIMER, FERRITIN, LDH, CRP in the last 72 hours.  Lab Results  Component Value Date   SARSCOV2NAA POSITIVE (A) 10/22/2018     Hepatic Function Latest Ref Rng & Units 12/03/2018 12/02/2018 12/01/2018  Total Protein 6.5 - 8.1 g/dL 6.8 6.7 6.6  Albumin 3.5 - 5.0 g/dL 3.1(L) 3.0(L) 3.0(L)  AST 15 - 41 U/L 39 37 36  ALT 0 - 44 U/L 42 38 38  Alk Phosphatase 38 - 126 U/L 121 118 127(H)  Total Bilirubin 0.3 - 1.2 mg/dL 0.3 0.2(L) 0.4        Component Value Date/Time   BNP 89.5 11/28/2018 0300       2.  Pseudomonas pneumonia -  Currently on Fortaz and clinically improving.  Continue for now.  Leukocytosis has improved and she is currently afebrile. Finished 7 days of treatment on 11/27/2018.  3.  ICU delirium, toxic encephalopathy.  Minimize sedatives and narcotics, Seroquel nightly, sitter if needed, restraints only if becomes danger to self or others by pulling lines or tubes.  Expose to sunlight, up in chair, PT. Much improved.  4.  Sinus tachycardia with questionable brief run of A. fib for a few minutes morning of 11/26/2018.  This would be a single isolated episode, no previous echo on file, she had stable TSH, currently on beta-blocker, unremarkable EKG, will place her on aspirin 81 mg, case discussed with cardiologist Dr. Marlou Porch who recommends no further change in treatment or plan, outpatient echocardiogram and cardiology follow-up.  Mali vas 2 score will be 3 .  5.  Hypokalemia.  Replaced orally will continue to monitor.  6. 1 Out of 4 blood culture contamination.  Stable monitor.  7.  Obesity with a BMI of 34.  Outpatient follow-up with PCP.  8.  Tongue laceration which was  noted by nursing staff several days ago, likely while she was intubated.  Healing well.  Supportive care.  Outpatient ENT follow-up if needed.  Speech following.   9.  Tracheostomy induced dysphagia requiring tube feeds.  Size 4 cuffless trach placed by pulmonary on 12/03/2018, speech following and she underwent modified barium swallow, currently on dysphagia 3 diet and doing well clinically, hopefully she can be decannulated this admission if she stays stable.  Discussed with PCCM on 12/03/2018, may be decannulated this admission.   Condition - Fair  Family Communication  : daughter [name] called 11/28/18 @ 9.30 am, 12/03/2018, called again on 12/04/2018 10:14 AM on her Cell but no response.  Code Status : Full code  Diet :   Diet Order            DIET DYS 3 Room service appropriate? Yes with Assist; Fluid consistency: Nectar Thick  Diet effective now               Disposition - SNF versus home health PT.  Second COVID test for placement purposes ordered on 12/03/2018.  Have requested social work and PT several times to arrange for SNF in the last week.  Consults  :  PCCM  Procedures  :   5/20 Intubated in ED and admitted to [facility] ICU 5/21-5/26 Weaning FIO2 and PEEP, diuresing 5/26-6/3 Tolerating weaning for 1-2 hours 6/7 weaning on pressure support ventilation again this morning 10/5 6/8 extubated, reintubated (tachypnea, tachycardia) 6/12 Trached 6/13 Trach collar all day  6/14 bleeding from tracheostomy site 6/17 48 hours off vent, fever > pseudomonas pneumonia 6/20 slightly more awake, remains off vent 6/26 mentation much improved moved out of ICU 6/29 modified barium swallow due 7/1.  Changed  to size 4 cuffless trach  PUD Prophylaxis : Famotidine   DVT Prophylaxis  :    Heparin    Lab Results  Component Value Date   PLT 348 12/03/2018    Inpatient Medications  Scheduled Meds:  aspirin EC  81 mg Oral Daily   chlorhexidine  15 mL Mouth/Throat BID    Chlorhexidine Gluconate Cloth  6 each Topical Daily   famotidine  20 mg Oral QHS   feeding supplement (ENSURE ENLIVE)  237 mL Oral TID BM   feeding supplement (PRO-STAT SUGAR FREE 64)  30 mL Oral TID WC   heparin injection (subcutaneous)  7,500 Units Subcutaneous Q8H   mouth rinse  15 mL Mouth Rinse QID   metoprolol tartrate  50 mg Oral BID   sodium chloride flush  10-40 mL Intracatheter Q12H   Continuous Infusions:  PRN Meds:.acetaminophen, hydrALAZINE, ipratropium-albuterol, metoprolol tartrate, ondansetron (ZOFRAN) IV, oxymetazoline, polyethylene glycol, QUEtiapine, Resource ThickenUp Clear, sennosides  Antibiotics  :    Anti-infectives (From admission, onward)   Start     Dose/Rate Route Frequency Ordered Stop   11/21/18 2200  cefTAZidime (FORTAZ) 2 g in sodium chloride 0.9 % 100 mL IVPB     2 g 200 mL/hr over 30 Minutes Intravenous Every 8 hours 11/21/18 1426 11/28/18 0656   11/21/18 1400  cefTAZidime (FORTAZ) 2 g in sodium chloride 0.9 % 100 mL IVPB  Status:  Discontinued     2 g 200 mL/hr over 30 Minutes Intravenous Every 8 hours 11/21/18 1330 11/21/18 1426   11/20/18 0400  vancomycin (VANCOCIN) IVPB 1000 mg/200 mL premix  Status:  Discontinued     1,000 mg 200 mL/hr over 60 Minutes Intravenous Every 12 hours 11/19/18 1517 11/20/18 1344   11/19/18 1530  vancomycin (VANCOCIN) 1,500 mg in sodium chloride 0.9 % 500 mL IVPB     1,500 mg 250 mL/hr over 120 Minutes Intravenous  Once 11/19/18 1517 11/19/18 1744   11/19/18 1530  piperacillin-tazobactam (ZOSYN) IVPB 3.375 g     3.375 g 12.5 mL/hr over 240 Minutes Intravenous Every 8 hours 11/19/18 1517 11/21/18 1734   11/01/18 1000  fluconazole (DIFLUCAN) IVPB 100 mg     100 mg 50 mL/hr over 60 Minutes Intravenous Every 24 hours 11/01/18 0857 11/03/18 1137   10/27/18 2200  cefTRIAXone (ROCEPHIN) 2 g in sodium chloride 0.9 % 100 mL IVPB     2 g 200 mL/hr over 30 Minutes Intravenous Every 24 hours 10/27/18 0835 10/28/18 2243     10/24/18 1400  remdesivir 100 mg in sodium chloride 0.9 % 250 mL IVPB  Status:  Discontinued     100 mg over 30 Minutes Intravenous Every 24 hours 10/23/18 1153 10/24/18 1335   10/24/18 1400  remdesivir 100 mg in sodium chloride 0.9 % 220 mL IVPB  Status:  Discontinued     100 mg over 30 Minutes Intravenous Every 24 hours 10/24/18 1334 10/24/18 1336   10/24/18 1400  remdesivir 100 mg in sodium chloride 0.9 % 230 mL IVPB     100 mg over 30 Minutes Intravenous Every 24 hours 10/24/18 1335 11/01/18 1500   10/24/18 1400  remdesivir 100 mg in sodium chloride 0.9 % 230 mL IVPB  Status:  Discontinued     100 mg over 30 Minutes Intravenous Every 24 hours 10/24/18 1337 10/24/18 1339   10/23/18 1400  remdesivir 200 mg in sodium chloride 0.9 % 250 mL IVPB     200 mg over 30 Minutes Intravenous Once  10/23/18 1153 10/23/18 1738   10/22/18 2200  azithromycin (ZITHROMAX) 500 mg in sodium chloride 0.9 % 250 mL IVPB     500 mg 250 mL/hr over 60 Minutes Intravenous Every 24 hours 10/22/18 2148 10/26/18 2255   10/22/18 2200  cefTRIAXone (ROCEPHIN) 1 g in sodium chloride 0.9 % 100 mL IVPB  Status:  Discontinued     1 g 200 mL/hr over 30 Minutes Intravenous Every 24 hours 10/22/18 2148 10/27/18 0835       Time Spent in minutes  30   Lala Lund M.D on 12/04/2018 at 10:13 AM  To page go to [email or web address] - password [facility]  [facility] -  Office  [phone]     See all Orders from today for further details    Objective:   Vitals:   12/04/18 0400 12/04/18 0425 12/04/18 0500 12/04/18 0755  BP: 110/66 112/71  110/66  Pulse:  92  94  Resp: (!) 30 (!) 28  (!) 22  Temp: 99.2 F (37.3 C)   99.1 F (37.3 C)  TempSrc: Axillary   Axillary  SpO2: 99% 98%  97%  Weight:   85.2 kg   Height:        Wt Readings from Last 3 Encounters:  12/04/18 85.2 kg     Intake/Output Summary (Last 24 hours) at 12/04/2018 1013 Last data filed at 12/04/2018 0839 Gross per 24 hour  Intake 1529.87 ml   Output 1100 ml  Net 429.87 ml     Physical Exam  Awake,  trach collar in place,  foley catheter, no focal deficits, healing large tongue laceration [place].AT,PERRAL Supple Neck,No JVD, No cervical lymphadenopathy appriciated.  Symmetrical Chest wall movement, Good air movement bilaterally, CTAB RRR,No Gallops, Rubs or new Murmurs, No Parasternal Heave +ve B.Sounds, Abd Soft, No tenderness, No organomegaly appriciated, No rebound - guarding or rigidity. No Cyanosis, Clubbing or edema, No new Rash or bruise     Data Review:    CBC Recent Labs  Lab 11/29/18 0325 11/30/18 0645 12/01/18 0700 12/02/18 0610 12/03/18 0330  WBC 15.1* 16.9* 15.1* 11.8* 12.1*  HGB 11.6* 11.8* 11.9* 11.8* 11.8*  HCT 39.6 40.0 39.8 39.0 39.9  PLT 398 347 345 334 348  MCV 98.8 96.9 96.8 96.1 97.1  MCH 28.9 28.6 29.0 29.1 28.7  MCHC 29.3* 29.5* 29.9* 30.3 29.6*  RDW 18.1* 17.8* 17.6* 17.6* 17.2*  LYMPHSABS 3.6 4.1* 4.4* 3.2 4.0  MONOABS 1.3* 1.5* 1.4* 1.4* 1.9*  EOSABS 0.8* 0.6* 0.5 0.6* 0.7*  BASOSABS 0.1 0.1 0.1 0.1 0.1    Chemistries  Recent Labs  Lab 11/29/18 0325 11/30/18 0645 12/01/18 0700 12/02/18 0610 12/03/18 0330  NA 146* 137 138 138 136  K 3.8 4.7 3.8 3.5 3.4*  CL 97* 97* 97* 97* 97*  CO2 '31 26 31 29 30  ' GLUCOSE 108* 92 117* 113* 94  BUN 22* 21* '12 11 10  ' CREATININE 0.45 0.54 0.41* 0.56 0.53  CALCIUM 9.6 8.8* 9.3 9.1 9.0  MG 2.4 2.6* 2.2 2.2 2.3  AST 36 57* 36 37 39  ALT 38 38 38 38 42  ALKPHOS 131* 126 127* 118 121  BILITOT 0.1* 0.7 0.4 0.2* 0.3   ------------------------------------------------------------------------------------------------------------------ No results for input(s): CHOL, HDL, LDLCALC, TRIG, CHOLHDL, LDLDIRECT in the last 72 hours.  No results found for: HGBA1C ------------------------------------------------------------------------------------------------------------------ No results for input(s): TSH, T4TOTAL, T3FREE, THYROIDAB in the last 72  hours.  Invalid input(s): FREET3  Lab Results  Component Value Date   TSH  1.034 11/19/2018    Cardiac Enzymes No results for input(s): CKMB, TROPONINI, MYOGLOBIN in the last 168 hours.  Invalid input(s): CK ------------------------------------------------------------------------------------------------------------------    Component Value Date/Time   BNP 89.5 11/28/2018 0300    Micro Results Recent Results (from the past 240 hour(s))  MRSA PCR Screening     Status: None   Collection Time: 11/28/18  2:00 PM   Specimen: Nasal Mucosa; Nasopharyngeal  Result Value Ref Range Status   MRSA by PCR NEGATIVE NEGATIVE Final    Comment:        The GeneXpert MRSA Assay (FDA approved for NASAL specimens only), is one component of a comprehensive MRSA colonization surveillance program. It is not intended to diagnose MRSA infection nor to guide or monitor treatment for MRSA infections. Performed at [facility], [place] [address] [place], [place] [address]     Radiology Reports Dg Abd 1 View  Result Date: 11/10/2018 CLINICAL DATA:  Orogastric placement EXAM: ABDOMEN - 1 VIEW COMPARISON:  None. FINDINGS: Orogastric tube enters the stomach, loops in the body and has its tip in the antrum. Bowel gas pattern appears normal. IMPRESSION: Orogastric tip in the antrum. Electronically Signed   By: Nelson Chimes M.D.   On: 11/10/2018 16:50   Dg Chest Port 1 View  Result Date: 11/25/2018 CLINICAL DATA:  ARDS, COVID-19 positive EXAM: PORTABLE CHEST 1 VIEW COMPARISON:  Portable exam 0508 hours compared to 11/19/2018 FINDINGS: Tracheostomy tube unchanged. Tip of RIGHT arm PICC line projects over cavoatrial junction. Feeding tube extends to at least the second portion of the duodenum. Stable heart size. Diffuse BILATERAL airspace infiltrates, unchanged. No pleural effusion or pneumothorax. IMPRESSION: Persistent diffuse BILATERAL airspace infiltrates Electronically Signed   By: Lavonia Dana M.D.   On: 11/25/2018 08:08   Dg Chest Port 1 View  Result Date: 11/19/2018 CLINICAL DATA:  Fever EXAM: PORTABLE CHEST 1 VIEW COMPARISON:  11/13/2018, 11/11/2018 FINDINGS: Tracheostomy tube in satisfactory position. Feeding tube coiled in the stomach. Right-sided PICC line with the tip projecting over the SVC. Worsening bilateral patchy interstitial and alveolar airspace disease throughout bilateral lungs concerning for multilobar pneumonia. No pleural effusion or pneumothorax. Stable cardiomediastinal silhouette. No aggressive osseous lesion. IMPRESSION: 1. Support lines and tubing in satisfactory position. 2. Worsening bilateral patchy interstitial and alveolar airspace disease throughout bilateral lungs concerning for multilobar pneumonia. Electronically Signed   By: Kathreen Devoid   On: 11/19/2018 13:38   Dg Chest Port 1 View  Result Date: 11/13/2018 CLINICAL DATA:  Tracheostomy placement.  COVID-19. EXAM: PORTABLE CHEST 1 VIEW COMPARISON:  11/11/2018. FINDINGS: Tracheostomy is been inserted. Tip lies 4.5 cm above carina. BILATERAL pulmonary opacities persist, LEFT greater than RIGHT, not significantly improved. Cardiomegaly. Feeding tube duodenum. IMPRESSION: Satisfactory tracheostomy placement. No significant improvement aeration. Electronically Signed   By: Staci Righter M.D.   On: 11/13/2018 16:16   Dg Chest Port 1 View  Result Date: 11/11/2018 CLINICAL DATA:  Follow-up aspiration EXAM: PORTABLE CHEST 1 VIEW COMPARISON:  11/10/2018 FINDINGS: Cardiac shadow is stable. Endotracheal tube and gastric catheter are noted in satisfactory position. Right-sided PICC line is noted at the cavoatrial junction. Patchy infiltrates are again seen bilaterally stable from the prior exam. No pneumothorax is noted. IMPRESSION: No change from the previous day. Electronically Signed   By: Inez Catalina M.D.   On: 11/11/2018 07:52   Dg Chest Port 1 View  Result Date: 11/10/2018 CLINICAL DATA:  Intubation.  OG  tube placement. EXAM: PORTABLE CHEST 1  VIEW COMPARISON:  November 09, 2018 FINDINGS: The ETT remains in good position, 2 cm above the carina. Bilateral pulmonary infiltrates remain, similar on the left and more prominent on the right in the interval. The OG tube terminates below today's film. No other changes. IMPRESSION: Support apparatus as above. Bilateral pulmonary infiltrates are stable on the left and mildly more prominent on the right in the interval. Electronically Signed   By: Dorise Bullion III M.D   On: 11/10/2018 16:27   Dg Chest Port 1 View  Result Date: 11/09/2018 CLINICAL DATA:  53 year old female positive COVID-19 status post PICC placement. EXAM: PORTABLE CHEST 1 VIEW COMPARISON:  Chest radiograph dated 11/04/2018 FINDINGS: A right-sided PICC is noted with tip over the right subclavian vein. Recommend further advancing by an additional approximately 10 cm. Endotracheal tube remains above the carina in similar position. Enteric tube extends below the diaphragm likely within the stomach. Interval progression of bilateral confluent densities since the prior radiograph. No large pleural effusion. There is no pneumothorax. No acute osseous pathology. Stable cardiac silhouette. IMPRESSION: 1. Right-sided PICC with tip over the right subclavian vein. Recommend further advancing an additional 10 cm. 2. Interval progression of bilateral confluent densities. Electronically Signed   By: Anner Crete M.D.   On: 11/09/2018 03:25   Dg Abd Portable 1v  Result Date: 11/29/2018 CLINICAL DATA:  Dysphagia EXAM: PORTABLE ABDOMEN - 1 VIEW COMPARISON:  Plain film of the abdomen dated 11/21/2018. FINDINGS: Enteric tube appears adequately positioned in the stomach. Visualized bowel gas pattern is nonobstructive. No evidence of free intraperitoneal air. IMPRESSION: Enteric tube appears adequately positioned in the stomach. Electronically Signed   By: Franki Cabot M.D.   On: 11/29/2018 11:29   Dg Abd Portable  1v  Result Date: 11/21/2018 CLINICAL DATA:  NG tube placement. EXAM: PORTABLE ABDOMEN - 1 VIEW COMPARISON:  Plain films of the abdomen dated 11/20/2018 and 11/19/2018. FINDINGS: Weighted tip feeding tube now appears appropriately positioned with tip in the proximal duodenum. Visualized bowel gas pattern is nonobstructive. IMPRESSION: Weighted tip feeding tube now appears appropriately positioned with tip in the proximal duodenum. Electronically Signed   By: Franki Cabot M.D.   On: 11/21/2018 12:24   Dg Abd Portable 1v  Result Date: 11/20/2018 CLINICAL DATA:  Encounter for NG tube placement EXAM: PORTABLE ABDOMEN - 1 VIEW COMPARISON:  11/19/2018 FINDINGS: Feeding tube coils in the stomach with the tip in the fundus. Nonobstructive bowel gas pattern. No organomegaly or free air. IMPRESSION: Feeding tube coils in the stomach with the tip in the fundus. Electronically Signed   By: Rolm Baptise M.D.   On: 11/20/2018 19:57   Dg Abd Portable 1v  Result Date: 11/19/2018 CLINICAL DATA:  Vomiting. EXAM: PORTABLE ABDOMEN - 1 VIEW COMPARISON:  None. FINDINGS: A feeding tube coils in the stomach with the distal tip in the fundus. No evidence of bowel obstruction. IMPRESSION: A feeding tube coils in the stomach. No evidence of bowel obstruction. No cause for vomiting noted. Electronically Signed   By: Dorise Bullion III M.D   On: 11/19/2018 16:43   Dg Swallowing Func-speech Pathology  Result Date: 12/01/2018 Objective Swallowing Evaluation: Type of Study: MBS-Modified Barium Swallow Study  Patient Details Name: Paulett Kaufhold MRN: [id number] Date of Birth: [birth year] Today's Date: 12/01/2018 Time: SLP Start Time (ACUTE ONLY): 1050 -SLP Stop Time (ACUTE ONLY): 1122 SLP Time Calculation (min) (ACUTE ONLY): 32 min Past Medical History: No past medical history on file. Past Surgical History: No past  surgical history on file. HPI: Pt adm 5/20 with hypoxic resp failure due to Covid 19. Pt intubated 5/20 and extubated 6/8,  with re-intubation 6/8; Trach 6/11. PMH - none.  Subjective: alert, distractible Assessment / Plan / Recommendation CHL IP CLINICAL IMPRESSIONS 12/01/2018 Clinical Impression Pt demonstrates moderate oropharyngeal dysphagia following prolonged critical illness and intubation. 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 PMSV in place throught, pt on 2 liters . Pt has relatively good oral function despite lingual wound that is healing well. LIngual manipulation for mastication, bolus formation and propulsion appears a little slow but adequate. However, premature spillage and delayed swallow initiation do lead to prolonged pooling of liquids in pyriform sinsues prior to swallow initiation. WIth thin liquids penetration and aspiration via the postirior commisure is failry consistent, no response from pt but a cued cough ejects. Chin tuck not beneficial. Nectar thick liquids and solids were not aspirated but there was mild to moderate diffuse pharyngeal residual suggesting mild generalized muscle weakness. Pt able to follow cue to swallow twice. Recommend a dys 3 mech soft) diet with nectar thick liquids and intermittent instruction to swallow again. Will follow for tolerance.  SLP Visit Diagnosis Dysphagia, oropharyngeal phase (R13.12) Attention and concentration deficit following -- Frontal lobe and executive function deficit following -- Impact on safety and function Moderate aspiration risk   CHL IP TREATMENT RECOMMENDATION 12/01/2018 Treatment Recommendations Therapy as outlined in treatment plan below   Prognosis 12/01/2018 Prognosis for Safe Diet Advancement Good Barriers to Reach Goals Cognitive deficits Barriers/Prognosis Comment -- CHL IP DIET RECOMMENDATION 12/01/2018 SLP Diet Recommendations Dysphagia 3 (Mech soft) solids;Nectar thick liquid Liquid Administration via Cup;Straw Medication Administration Whole meds with liquid Compensations Slow  rate;Small sips/bites;Follow solids with liquid;Multiple dry swallows after each bite/sip Postural Changes Seated upright at 90 degrees   CHL IP OTHER RECOMMENDATIONS 12/01/2018 Recommended Consults -- Oral Care Recommendations Oral care BID Other Recommendations Order thickener from pharmacy   CHL IP FOLLOW UP RECOMMENDATIONS 12/01/2018 Follow up Recommendations Inpatient Rehab   CHL IP FREQUENCY AND DURATION 12/01/2018 Speech Therapy Frequency (ACUTE ONLY) min 2x/week Treatment Duration 2 weeks      CHL IP ORAL PHASE 12/01/2018 Oral Phase WFL Oral - Pudding Teaspoon -- Oral - Pudding Cup -- Oral - Honey Teaspoon -- Oral - Honey Cup -- Oral - Nectar Teaspoon -- Oral - Nectar Cup -- Oral - Nectar Straw -- Oral - Thin Teaspoon -- Oral - Thin Cup -- Oral - Thin Straw -- Oral - Puree -- Oral - Mech Soft -- Oral - Regular -- Oral - Multi-Consistency -- Oral - Pill -- Oral Phase - Comment --  CHL IP PHARYNGEAL PHASE 12/01/2018 Pharyngeal Phase Impaired Pharyngeal- Pudding Teaspoon -- Pharyngeal -- Pharyngeal- Pudding Cup -- Pharyngeal -- Pharyngeal- Honey Teaspoon -- Pharyngeal -- Pharyngeal- Honey Cup -- Pharyngeal -- Pharyngeal- Nectar Teaspoon -- Pharyngeal -- Pharyngeal- Nectar Cup Delayed swallow initiation-pyriform sinuses;Reduced tongue base retraction;Reduced laryngeal elevation;Reduced anterior laryngeal mobility;Reduced epiglottic inversion;Reduced pharyngeal peristalsis;Pharyngeal residue - valleculae;Pharyngeal residue - pyriform Pharyngeal -- Pharyngeal- Nectar Straw Delayed swallow initiation-pyriform sinuses;Reduced tongue base retraction;Reduced laryngeal elevation;Reduced anterior laryngeal mobility;Reduced epiglottic inversion;Reduced pharyngeal peristalsis;Pharyngeal residue - valleculae;Pharyngeal residue - pyriform Pharyngeal -- Pharyngeal- Thin Teaspoon -- Pharyngeal -- Pharyngeal- Thin Cup Delayed swallow initiation-pyriform sinuses;Reduced airway/laryngeal closure;Penetration/Aspiration during  swallow;Trace aspiration;Pharyngeal residue - pyriform Pharyngeal Material enters airway, CONTACTS cords and not ejected out;Material enters airway, passes BELOW cords without attempt by patient to eject out (silent aspiration) Pharyngeal- Thin  Straw Delayed swallow initiation-pyriform sinuses;Reduced airway/laryngeal closure;Penetration/Aspiration during swallow;Trace aspiration;Pharyngeal residue - pyriform;Compensatory strategies attempted (with notebox) Pharyngeal Material enters airway, passes BELOW cords without attempt by patient to eject out (silent aspiration);Material enters airway, CONTACTS cords and not ejected out Pharyngeal- Puree Delayed swallow initiation-pyriform sinuses;Reduced tongue base retraction;Reduced laryngeal elevation;Reduced anterior laryngeal mobility;Reduced epiglottic inversion;Reduced pharyngeal peristalsis;Pharyngeal residue - valleculae;Pharyngeal residue - pyriform Pharyngeal -- Pharyngeal- Mechanical Soft -- Pharyngeal -- Pharyngeal- Regular Delayed swallow initiation-pyriform sinuses;Reduced tongue base retraction;Reduced laryngeal elevation;Reduced anterior laryngeal mobility;Reduced epiglottic inversion;Reduced pharyngeal peristalsis;Pharyngeal residue - valleculae;Pharyngeal residue - pyriform Pharyngeal -- Pharyngeal- Multi-consistency -- Pharyngeal -- Pharyngeal- Pill -- Pharyngeal -- Pharyngeal Comment --  No flowsheet data found. DeBlois, Katherene Ponto 12/01/2018, 12:02 PM              [place] Ekg Site Rite  Result Date: 11/09/2018 If [facility] image not attached, placement could not be confirmed due to current cardiac rhythm.

## 2018-12-04 NOTE — Progress Notes (Addendum)
Patient has no insurance.   CSW acknowledges SNF or LTAC consult made yesterday on 7/1.   CSW reached out to both [facility] and [facility] taking COVID patients, neither have solutions for patient's discharge planning as they cannot take a patient without insurance.   [place], [place]

## 2018-12-04 NOTE — Progress Notes (Signed)
Occupational Therapy Treatment Patient Details Name: Shannon Meyer MRN: [id number] DOB: 07/05/1965 Today's Date: 12/04/2018    History of present illness Pt adm 5/20 with hypoxic resp failure due to Covid 19. Pt intubated 5/20 and extubated 6/8, with re-intubation 6/8; Trach 6/11. PMH - none. 6/17 foul smelling trach site/secretions, elevated WBC and temp; vomited tube feeds.   OT comments  Excellent session. Able to progress OOB to chair with use of Stedy. Pt is ready to use RW, however, she is limited by anxiety with fear of falling and the Antony SalmonStedy gives he a sense of security. Pt complaining of "dizziness EOB", however, was not orthostatic. Once in chair, pt able to bath upper body and complete oral care with set up. Participated in BUE strengthening and fine motor control/coordination activities. Cognition continues to improve daily. Pt expressing how she is frustrating with not remembering things - started journal for pt to record daily events to compensate. Pt expressed how much she misses her family and is ready to go home. Excellent CIR candidate. Will continue to follow.   Follow Up Recommendations  CIR;Supervision/Assistance - 24 hour    Equipment Recommendations  3 in 1 bedside commode;Other (comment)    Recommendations for Other Services Rehab consult    Precautions / Restrictions Precautions Precautions: Fall Precaution Comments: monitor vitals       Mobility Bed Mobility Overal bed mobility: Needs Assistance Bed Mobility: Supine to Sit     Supine to sit: Min guard;HOB elevated     General bed mobility comments: Complained of dizziness. BP stable  Transfers Overall transfer level: Needs assistance   Transfers: Sit to/from Stand Sit to Stand: Min assist;+2 physical assistance;From elevated surface         General transfer comment: Pt anxious but improved aiblity to stand    Balance     Sitting balance-[name] Scale: Fair                                      ADL either performed or assessed with clinical judgement   ADL Overall ADL's : Needs assistance/impaired Eating/Feeding: Set up;Sitting   Grooming: Oral care;Set up Grooming Details (indicate cue type and reason): Will need more assistance for hair Upper Body Bathing: Set up;Supervision/ safety;Sitting Upper Body Bathing Details (indicate cue type and reason): requires 2 rest breaks during bathing upper body                         Functional mobility during ADLs: Minimal assistance;+2 for physical assistance(using Stedy) General ADL Comments: Improved ability to participate and complete ADL tasks. Limited by endurance but willing to participate.     Vision       Perception     Praxis      Cognition Arousal/Alertness: Awake/alert Behavior During Therapy: Anxious Overall Cognitive Status: Impaired/Different from baseline Area of Impairment: Attention;Memory;Safety/judgement;Awareness                   Current Attention Level: Selective Memory: Decreased short-term memory Following Commands: Follows one step commands consistently Safety/Judgement: Decreased awareness of safety;Decreased awareness of deficits Awareness: Emergent Problem Solving: Slow processing General Comments: Cognition is improving daily; Pt using more abstract thinking regarding her DC plan, but is unrealistic regaridng her current functional ability; pt expressing how she misses her family; feel patient's attentionto task is impacted by her endurance as she will participates in  a task then flop  back into the chair saying "I'm give out"        Exercises Exercises: General Upper Extremity General Exercises - Upper Extremity Shoulder Flexion: Strengthening;Both;Seated;20 reps Shoulder Extension: Strengthening;Both;10 reps;Supine Shoulder ABduction: Strengthening;Both;Seated;20 reps Shoulder Horizontal ABduction: AROM;Strengthening;Both;20 reps Elbow Flexion:  Strengthening;Both;20 reps;Theraband;Seated Theraband Level (Elbow Flexion): Level 1 (Yellow) Elbow Extension: Strengthening;Both;20 reps;Theraband Theraband Level (Elbow Extension): Level 1 (Yellow) Digit Composite Flexion: Squeeze ball;20 reps;Both Other Exercises Other Exercises: Educated on use of yellow theraputty   Shoulder Instructions       General Comments Pt frustrated with her inability to remember "things". Started a "journal" for pt with pt working on her writing.  Educated pt on how wiriting and coloring and using her hands will increase her strength. Pt verbalized understanding.     Pertinent Vitals/ Pain       Pain Assessment: Faces Faces Pain Scale: Hurts a little bit Pain Location: general discomfort Pain Descriptors / Indicators: Discomfort;Grimacing Pain Intervention(s): Limited activity within patient's tolerance  Home Living                                          Prior Functioning/Environment              Frequency  Min 3X/week        Progress Toward Goals  OT Goals(current goals can now be found in the care plan section)  Progress towards OT goals: Progressing toward goals  Acute Rehab OT Goals Patient Stated Goal: to go home and be with her family OT Goal Formulation: With patient/family Time For Goal Achievement: 12/08/18 Potential to Achieve Goals: Good ADL Goals Pt Will Perform Eating: with modified independence Pt Will Perform Grooming: with min assist;sitting Pt Will Perform Upper Body Bathing: with set-up;sitting Pt Will Transfer to Toilet: stand pivot transfer;bedside commode;with mod assist Pt Will Perform Toileting - Clothing Manipulation and hygiene: with mod assist;sit to/from stand;sitting/lateral leans Additional ADL Goal #1: Pt will perform bed mobility with Min A in preparation for ADLs Additional ADL Goal #2: Pt will tolerate sitting at EOB for 10 minutes with Min Guard A in preparation for  ADLs Additional ADL Goal #3: Pt will demonstrate selective attention during ADL in a distracting environement with Min cues  Plan Discharge plan remains appropriate    Co-evaluation                 AM-PAC OT "6 Clicks" Daily Activity     Outcome Measure   Help from another person eating meals?: A Little Help from another person taking care of personal grooming?: A Little Help from another person toileting, which includes using toliet, bedpan, or urinal?: A Lot Help from another person bathing (including washing, rinsing, drying)?: A Little Help from another person to put on and taking off regular upper body clothing?: A Lot Help from another person to put on and taking off regular lower body clothing?: A Lot 6 Click Score: 15    End of Session Equipment Utilized During Treatment: Oxygen(4L)  OT Visit Diagnosis: Unsteadiness on feet (R26.81);Other abnormalities of gait and mobility (R26.89);Muscle weakness (generalized) (M62.81);Other symptoms and signs involving cognitive function Pain - part of body: (discomfort)   Activity Tolerance Patient tolerated treatment well   Patient Left in chair;with call bell/phone within reach;with chair alarm set   Nurse Communication Mobility status;Need for lift equipment(Stedy)  Time: 1005-1055 OT Time Calculation (min): 50 min  Charges: OT General Charges $OT Visit: 1 Visit OT Treatments $Self Care/Home Management : 8-22 mins $Therapeutic Activity: 8-22 mins $Neuromuscular Re-education: 8-22 mins  Luisa DagoHilary Maleni Seyer, OT/L   Acute OT Clinical Specialist Acute Rehabilitation Services Pager [phone] Office [phone]    [facility] Center[facility],HILLARY 12/04/2018, 5:55 PM

## 2018-12-04 NOTE — Progress Notes (Signed)
Advised during report that existing laceration on patient's tongue had started bleeding twice.  Gauze and cloth placed, pt unable to maintain pressure.  At the time of shift change, the bleeding had not stopped.    I was called to room for emergency - pt vomiting blood.  Pt in bed, large amount of bright red blood on patient, gown, & bedding.  Pt continuing to vomit.  Respiratory called.  Pt bathed, bed linens change, continued to attempt to stop bleeding.   Respiratory therapist at bedside.  RT advised against changing trach collar due to change in trach today.    MD Danford contacted re: inability to stop bleeding.  Afrin ordered to be applied to laceration with pressure for 15 minutes.  Afrin applied, and writer manually applied pressure to tongue with washcloth for 15 minutes.    After 15 minutes of pressure, bleeding stopped.  Pt advised to be very careful with her tongue so as not to start bleeding again.  Pt verbalized understanding.

## 2018-12-05 LAB — BASIC METABOLIC PANEL
Anion gap: 9 (ref 5–15)
BUN: 13 mg/dL (ref 6–20)
CO2: 28 mmol/L (ref 22–32)
Calcium: 8.8 mg/dL — ABNORMAL LOW (ref 8.9–10.3)
Chloride: 101 mmol/L (ref 98–111)
Creatinine, Ser: 0.58 mg/dL (ref 0.44–1.00)
GFR calc Af Amer: >60 mL/min (ref >60)
GFR calc non Af Amer: >60 mL/min (ref >60)
Glucose, Bld: 98 mg/dL (ref 70–99)
Potassium: 3.9 mmol/L (ref 3.5–5.1)
Sodium: 138 mmol/L (ref 135–145)

## 2018-12-05 LAB — CBC WITH DIFFERENTIAL/PLATELET
Abs Immature Granulocytes: 0.06 10*3/uL (ref 0.00–0.07)
Basophils Absolute: 0.1 10*3/uL (ref 0.0–0.1)
Basophils Relative: 1 %
Eosinophils Absolute: 0.8 10*3/uL — ABNORMAL HIGH (ref 0.0–0.5)
Eosinophils Relative: 7 %
HCT: 32.9 % — ABNORMAL LOW (ref 36.0–46.0)
Hemoglobin: 10 g/dL — ABNORMAL LOW (ref 12.0–15.0)
Immature Granulocytes: 1 %
Lymphocytes Relative: 35 %
Lymphs Abs: 3.8 10*3/uL (ref 0.7–4.0)
MCH: 29.1 pg (ref 26.0–34.0)
MCHC: 30.4 g/dL (ref 30.0–36.0)
MCV: 95.6 fL (ref 80.0–100.0)
Monocytes Absolute: 1.4 10*3/uL — ABNORMAL HIGH (ref 0.1–1.0)
Monocytes Relative: 13 %
Neutro Abs: 4.7 10*3/uL (ref 1.7–7.7)
Neutrophils Relative %: 43 %
Platelets: 276 10*3/uL (ref 150–400)
RBC: 3.44 MIL/uL — ABNORMAL LOW (ref 3.87–5.11)
RDW: 17.2 % — ABNORMAL HIGH (ref 11.5–15.5)
WBC: 10.8 10*3/uL — ABNORMAL HIGH (ref 4.0–10.5)
nRBC: 0 % (ref 0.0–0.2)

## 2018-12-05 LAB — C-REACTIVE PROTEIN: CRP: 2.5 mg/dL — ABNORMAL HIGH (ref <1.0)

## 2018-12-05 LAB — MAGNESIUM: Magnesium: 2.3 mg/dL (ref 1.7–2.4)

## 2018-12-05 LAB — FERRITIN: Ferritin: 38 ng/mL (ref 11–307)

## 2018-12-05 LAB — D-DIMER, QUANTITATIVE: D-Dimer, Quant: 0.72 ug/mL-FEU — ABNORMAL HIGH (ref 0.00–0.50)

## 2018-12-05 NOTE — Progress Notes (Signed)
Inpatient Rehab Admissions Coordinator:   Note pt with negative COVID test drawn on 12/03/2018. If pt would like to be considered for CIR, please place an IP Rehab MD Consult order.   Shann Medal, PT, DPT Admissions Coordinator [phone] 12/05/18  9:11 AM

## 2018-12-05 NOTE — Progress Notes (Signed)
PROGRESS NOTE                                                                                                                                                                                                             Patient Demographics:    Shannon Meyer, is a 53 y.o. female, DOB - Jan 02, 1966, HMC:[id number]  Outpatient Primary MD for the patient is No primary care provider on file.    LOS - 19  Admit date - 10/22/2018    Chief Complaint  Patient presents with   Weakness       Brief Narrative  Shannon Meyer is a 53 y.o. female with no significant medical history who presented with cough, weakness and shortness of breath found to have covid-19 with bilateral CXR opacities, intubated in ED and admitted to [facility] 5/20. She has had a prolonged hospital course. She was unable to wean from ventilator and underwent tracheostomy. She began developing fevers and found to have pseudomonas in her sputum. She is currently on antibiotics. Still has significant leukocytosis that is being monitored. Weaning off oxygen. Plan is for CIR/SNF/LTAC once stable.   Subjective:   Patient in bed, appears comfortable, denies any headache, no fever, no chest pain or pressure, no shortness of breath , no abdominal pain. No focal weakness.   Assessment  & Plan :     1. Acute Hypoxic Resp. Failure due to Acute Covid 19 Viral Pneumonitis during the ongoing 2020 Covid 19 Pandemic - she has completed remdesivir 5/21 - 5/30, received actemra 5/20, and 5/21. Received CTX 5/20-5/26, azithromycin 5/20-5/24. Needed to be intubated subsequently trached after she failed extubation on 11/10/2018, currently on 2 L nasal cannula oxygen with a cuff less size for trach which was placed by pulmonary critical care on 12/03/2018.  Clinically much improved, continue supportive care. Will advance activity, PT, Med surg transfer.  Repeat COVID test for placement ordered on  12/03/2018 is negative, may go to CIR if patient so wishes.  ABG     Component Value Date/Time   PHART 7.424 11/22/2018 2116   PCO2ART 41.5 11/22/2018 2116   PO2ART 65.0 (L) 11/22/2018 2116  HCO3 27.1 11/22/2018 2116   TCO2 28 11/22/2018 2116   ACIDBASEDEF 1.0 10/24/2018 0419   O2SAT 92.0 11/22/2018 2116    COVID-19 Labs  Recent Labs    12/05/18 0713  DDIMER 0.72*  FERRITIN 38  CRP 2.5*    Lab Results  Component Value Date   SARSCOV2NAA NOT DETECTED 12/03/2018   SARSCOV2NAA POSITIVE (A) 10/22/2018     Hepatic Function Latest Ref Rng & Units 12/03/2018 12/02/2018 12/01/2018  Total Protein 6.5 - 8.1 g/dL 6.8 6.7 6.6  Albumin 3.5 - 5.0 g/dL 3.1(L) 3.0(L) 3.0(L)  AST 15 - 41 U/L 39 37 36  ALT 0 - 44 U/L 42 38 38  Alk Phosphatase 38 - 126 U/L 121 118 127(H)  Total Bilirubin 0.3 - 1.2 mg/dL 0.3 0.2(L) 0.4        Component Value Date/Time   BNP 89.5 11/28/2018 0300       2.  Pseudomonas pneumonia -  Currently on Fortaz and clinically improving.  Continue for now.  Leukocytosis has improved and she is currently afebrile. Finished 7 days of treatment on 11/27/2018.  3.  ICU delirium, toxic encephalopathy.  Minimize sedatives and narcotics, Seroquel nightly, sitter if needed, restraints only if becomes danger to self or others by pulling lines or tubes.  Expose to sunlight, up in chair, PT. Much improved.  4.  Sinus tachycardia with questionable brief run of A. fib for a few minutes morning of 11/26/2018.  This would be a single isolated episode, no previous echo on file, she had stable TSH, currently on beta-blocker, unremarkable EKG, will place her on aspirin 81 mg, case discussed with cardiologist Dr. Marlou Porch who recommends no further change in treatment or plan, outpatient echocardiogram and cardiology follow-up.  Mali vas 2 score will be 3 .  5.  Hypokalemia.  Replaced orally will continue to monitor.  6. 1 Out of 4 blood culture contamination.  Stable monitor.  7.   Obesity with a BMI of 34.  Outpatient follow-up with PCP.  8.  Tongue laceration which was noted by nursing staff several days ago, likely while she was intubated.  Healing well.  Supportive care.  Outpatient ENT follow-up if needed.  Speech following.   9.  Tracheostomy induced dysphagia requiring tube feeds.  Size 4 cuffless trach placed by pulmonary on 12/03/2018, speech following and she underwent modified barium swallow, currently on dysphagia 3 diet and doing well clinically, hopefully she can be decannulated this admission if she stays stable.  Discussed with PCCM on 12/03/2018, may be decannulated this admission.   Condition - Fair  Family Communication  : daughter [name] called 11/28/18 @ 9.30 am, 12/03/2018,12/04/2018 5.45 pm  Code Status : Full code  Diet :   Diet Order            DIET DYS 3 Room service appropriate? Yes with Assist; Fluid consistency: Thin  Diet effective now               Disposition - CIR, SNF, HHPT ( patient wants HHPT)  All 3 ordered  Consults  :  PCCM  Procedures  :   5/20 Intubated in ED and admitted to [facility] ICU 5/21-5/26 Weaning FIO2 and PEEP, diuresing 5/26-6/3 Tolerating weaning for 1-2 hours 6/7 weaning on pressure support ventilation again this morning 10/5 6/8 extubated, reintubated (tachypnea, tachycardia) 6/12 Trached 6/13 Trach collar all day  6/14 bleeding from tracheostomy site 6/17 48 hours off vent, fever > pseudomonas pneumonia 6/20 slightly  more awake, remains off vent 6/26 mentation much improved moved out of ICU 6/29 modified barium swallow due 7/1.  Changed to size 4 cuffless trach  PUD Prophylaxis : Famotidine   DVT Prophylaxis  :    Heparin    Lab Results  Component Value Date   PLT 276 12/05/2018    Inpatient Medications  Scheduled Meds:  aspirin EC  81 mg Oral Daily   chlorhexidine  15 mL Mouth/Throat BID   Chlorhexidine Gluconate Cloth  6 each Topical Daily   famotidine  20 mg Oral QHS     feeding supplement (ENSURE ENLIVE)  237 mL Oral TID BM   feeding supplement (PRO-STAT SUGAR FREE 64)  30 mL Oral TID WC   heparin injection (subcutaneous)  7,500 Units Subcutaneous Q8H   mouth rinse  15 mL Mouth Rinse QID   metoprolol tartrate  50 mg Oral BID   sodium chloride flush  10-40 mL Intracatheter Q12H   Continuous Infusions:  PRN Meds:.acetaminophen, hydrALAZINE, ipratropium-albuterol, metoprolol tartrate, ondansetron (ZOFRAN) IV, oxymetazoline, polyethylene glycol, QUEtiapine, Resource ThickenUp Clear, sennosides  Antibiotics  :    Anti-infectives (From admission, onward)   Start     Dose/Rate Route Frequency Ordered Stop   11/21/18 2200  cefTAZidime (FORTAZ) 2 g in sodium chloride 0.9 % 100 mL IVPB     2 g 200 mL/hr over 30 Minutes Intravenous Every 8 hours 11/21/18 1426 11/28/18 0656   11/21/18 1400  cefTAZidime (FORTAZ) 2 g in sodium chloride 0.9 % 100 mL IVPB  Status:  Discontinued     2 g 200 mL/hr over 30 Minutes Intravenous Every 8 hours 11/21/18 1330 11/21/18 1426   11/20/18 0400  vancomycin (VANCOCIN) IVPB 1000 mg/200 mL premix  Status:  Discontinued     1,000 mg 200 mL/hr over 60 Minutes Intravenous Every 12 hours 11/19/18 1517 11/20/18 1344   11/19/18 1530  vancomycin (VANCOCIN) 1,500 mg in sodium chloride 0.9 % 500 mL IVPB     1,500 mg 250 mL/hr over 120 Minutes Intravenous  Once 11/19/18 1517 11/19/18 1744   11/19/18 1530  piperacillin-tazobactam (ZOSYN) IVPB 3.375 g     3.375 g 12.5 mL/hr over 240 Minutes Intravenous Every 8 hours 11/19/18 1517 11/21/18 1734   11/01/18 1000  fluconazole (DIFLUCAN) IVPB 100 mg     100 mg 50 mL/hr over 60 Minutes Intravenous Every 24 hours 11/01/18 0857 11/03/18 1137   10/27/18 2200  cefTRIAXone (ROCEPHIN) 2 g in sodium chloride 0.9 % 100 mL IVPB     2 g 200 mL/hr over 30 Minutes Intravenous Every 24 hours 10/27/18 0835 10/28/18 2243   10/24/18 1400  remdesivir 100 mg in sodium chloride 0.9 % 250 mL IVPB  Status:   Discontinued     100 mg over 30 Minutes Intravenous Every 24 hours 10/23/18 1153 10/24/18 1335   10/24/18 1400  remdesivir 100 mg in sodium chloride 0.9 % 220 mL IVPB  Status:  Discontinued     100 mg over 30 Minutes Intravenous Every 24 hours 10/24/18 1334 10/24/18 1336   10/24/18 1400  remdesivir 100 mg in sodium chloride 0.9 % 230 mL IVPB     100 mg over 30 Minutes Intravenous Every 24 hours 10/24/18 1335 11/01/18 1500   10/24/18 1400  remdesivir 100 mg in sodium chloride 0.9 % 230 mL IVPB  Status:  Discontinued     100 mg over 30 Minutes Intravenous Every 24 hours 10/24/18 1337 10/24/18 1339   10/23/18 1400  remdesivir  200 mg in sodium chloride 0.9 % 250 mL IVPB     200 mg over 30 Minutes Intravenous Once 10/23/18 1153 10/23/18 1738   10/22/18 2200  azithromycin (ZITHROMAX) 500 mg in sodium chloride 0.9 % 250 mL IVPB     500 mg 250 mL/hr over 60 Minutes Intravenous Every 24 hours 10/22/18 2148 10/26/18 2255   10/22/18 2200  cefTRIAXone (ROCEPHIN) 1 g in sodium chloride 0.9 % 100 mL IVPB  Status:  Discontinued     1 g 200 mL/hr over 30 Minutes Intravenous Every 24 hours 10/22/18 2148 10/27/18 0835       Time Spent in minutes  30   Lala Lund M.D on 12/05/2018 at 10:14 AM  To page go to [email or web address] - password [facility]  [facility] -  Office  [phone]     See all Orders from today for further details    Objective:   Vitals:   12/05/18 0410 12/05/18 0500 12/05/18 0812 12/05/18 0900  BP:   115/74   Pulse:   90   Resp:   18   Temp: 98.2 F (36.8 C)  98.5 F (36.9 C)   TempSrc: Oral  Oral   SpO2:   95% 96%  Weight:  89.3 kg    Height:        Wt Readings from Last 3 Encounters:  12/05/18 89.3 kg     Intake/Output Summary (Last 24 hours) at 12/05/2018 1014 Last data filed at 12/05/2018 0630 Gross per 24 hour  Intake 120 ml  Output 650 ml  Net -530 ml     Physical Exam  Awake and Alert, Trach in place, foley catheter, no focal deficits, healing  large tongue laceration [place].AT,PERRAL Supple Neck,No JVD, No cervical lymphadenopathy appriciated.  Symmetrical Chest wall movement, Good air movement bilaterally, CTAB RRR,No Gallops, Rubs or new Murmurs, No Parasternal Heave +ve B.Sounds, Abd Soft, No tenderness, No organomegaly appriciated, No rebound - guarding or rigidity. No Cyanosis, Clubbing or edema, No new Rash or bruise    Data Review:    CBC Recent Labs  Lab 11/30/18 0645 12/01/18 0700 12/02/18 0610 12/03/18 0330 12/05/18 0713  WBC 16.9* 15.1* 11.8* 12.1* 10.8*  HGB 11.8* 11.9* 11.8* 11.8* 10.0*  HCT 40.0 39.8 39.0 39.9 32.9*  PLT 347 345 334 348 276  MCV 96.9 96.8 96.1 97.1 95.6  MCH 28.6 29.0 29.1 28.7 29.1  MCHC 29.5* 29.9* 30.3 29.6* 30.4  RDW 17.8* 17.6* 17.6* 17.2* 17.2*  LYMPHSABS 4.1* 4.4* 3.2 4.0 3.8  MONOABS 1.5* 1.4* 1.4* 1.9* 1.4*  EOSABS 0.6* 0.5 0.6* 0.7* 0.8*  BASOSABS 0.1 0.1 0.1 0.1 0.1    Chemistries  Recent Labs  Lab 11/29/18 0325 11/30/18 0645 12/01/18 0700 12/02/18 0610 12/03/18 0330 12/05/18 0713  NA 146* 137 138 138 136 138  K 3.8 4.7 3.8 3.5 3.4* 3.9  CL 97* 97* 97* 97* 97* 101  CO2 _0 GLUCOSE 108* 92 117* 113* 94 98  BUN 22* 21* _1 CREATININE 0.45 0.54 0.41* 0.56 0.53 0.58  CALCIUM 9.6 8.8* 9.3 9.1 9.0 8.8*  MG 2.4 2.6* 2.2 2.2 2.3 2.3  AST 36 57* 36 37 39  --   ALT 38 38 38 38 42  --   ALKPHOS 131* 126 127* 118 121  --   BILITOT 0.1* 0.7 0.4 0.2* 0.3  --    ------------------------------------------------------------------------------------------------------------------ No results for input(s): CHOL, HDL, LDLCALC, TRIG, CHOLHDL, LDLDIRECT in  the last 72 hours.  No results found for: HGBA1C ------------------------------------------------------------------------------------------------------------------ No results for input(s): TSH, T4TOTAL, T3FREE, THYROIDAB in the last 72 hours.  Invalid input(s): FREET3  Lab Results  Component Value  Date   TSH 1.034 11/19/2018    Cardiac Enzymes No results for input(s): CKMB, TROPONINI, MYOGLOBIN in the last 168 hours.  Invalid input(s): CK ------------------------------------------------------------------------------------------------------------------    Component Value Date/Time   BNP 89.5 11/28/2018 0300    Micro Results Recent Results (from the past 240 hour(s))  MRSA PCR Screening     Status: None   Collection Time: 11/28/18  2:00 PM   Specimen: Nasal Mucosa; Nasopharyngeal  Result Value Ref Range Status   MRSA by PCR NEGATIVE NEGATIVE Final    Comment:        The GeneXpert MRSA Assay (FDA approved for NASAL specimens only), is one component of a comprehensive MRSA colonization surveillance program. It is not intended to diagnose MRSA infection nor to guide or monitor treatment for MRSA infections. Performed at [facility], [place] [address] [place], [place] [address]   Novel Coronavirus, NAA (hospital order; send-out to ref lab)     Status: None   Collection Time: 12/03/18  8:07 AM   Specimen: Nasopharyngeal Swab; Respiratory  Result Value Ref Range Status   SARS-CoV-2, NAA NOT DETECTED NOT DETECTED Final    Comment: (NOTE) This test was developed and its performance characteristics determined by [employer]. This test has not been FDA cleared or approved. This test has been authorized by FDA under an Emergency Use Authorization (EUA). This test is only authorized for the duration of time the declaration that circumstances exist justifying the authorization of the emergency use of in vitro diagnostic tests for detection of SARS-CoV-2 virus and/or diagnosis of COVID-19 infection under section 564(b)(1) of the Act, 21 U.S.C. 580DXI-3(J)(8), unless the authorization is terminated or revoked sooner. When diagnostic testing is negative, the possibility of a false negative result should be considered in the context of a patient's  recent exposures and the presence of clinical signs and symptoms consistent with COVID-19. An individual without symptoms of COVID-19 and who is not shedding SARS-CoV-2 virus would expect to have a negative (not detected) result in this assay. Performed  At: [facility] [place], [place] [id number] Rush Farmer MD HA:[id number]    [place]  Final    Comment: Performed at [facility] [address] [place], [place] [address]    Radiology Reports Dg Abd 1 View  Result Date: 11/10/2018 CLINICAL DATA:  Orogastric placement EXAM: ABDOMEN - 1 VIEW COMPARISON:  None. FINDINGS: Orogastric tube enters the stomach, loops in the body and has its tip in the antrum. Bowel gas pattern appears normal. IMPRESSION: Orogastric tip in the antrum. Electronically Signed   By: Nelson Chimes M.D.   On: 11/10/2018 16:50   Dg Chest Port 1 View  Result Date: 11/25/2018 CLINICAL DATA:  ARDS, COVID-19 positive EXAM: PORTABLE CHEST 1 VIEW COMPARISON:  Portable exam 0508 hours compared to 11/19/2018 FINDINGS: Tracheostomy tube unchanged. Tip of RIGHT arm PICC line projects over cavoatrial junction. Feeding tube extends to at least the second portion of the duodenum. Stable heart size. Diffuse BILATERAL airspace infiltrates, unchanged. No pleural effusion or pneumothorax. IMPRESSION: Persistent diffuse BILATERAL airspace infiltrates Electronically Signed   By: Lavonia Dana M.D.   On: 11/25/2018 08:08   Dg Chest Port 1 View  Result Date: 11/19/2018 CLINICAL DATA:  Fever EXAM:  PORTABLE CHEST 1 VIEW COMPARISON:  11/13/2018, 11/11/2018 FINDINGS: Tracheostomy tube in satisfactory position. Feeding tube coiled in the stomach. Right-sided PICC line with the tip projecting over the SVC. Worsening bilateral patchy interstitial and alveolar airspace disease throughout bilateral lungs concerning for multilobar pneumonia. No pleural effusion or pneumothorax. Stable  cardiomediastinal silhouette. No aggressive osseous lesion. IMPRESSION: 1. Support lines and tubing in satisfactory position. 2. Worsening bilateral patchy interstitial and alveolar airspace disease throughout bilateral lungs concerning for multilobar pneumonia. Electronically Signed   By: Kathreen Devoid   On: 11/19/2018 13:38   Dg Chest Port 1 View  Result Date: 11/13/2018 CLINICAL DATA:  Tracheostomy placement.  COVID-19. EXAM: PORTABLE CHEST 1 VIEW COMPARISON:  11/11/2018. FINDINGS: Tracheostomy is been inserted. Tip lies 4.5 cm above carina. BILATERAL pulmonary opacities persist, LEFT greater than RIGHT, not significantly improved. Cardiomegaly. Feeding tube duodenum. IMPRESSION: Satisfactory tracheostomy placement. No significant improvement aeration. Electronically Signed   By: Staci Righter M.D.   On: 11/13/2018 16:16   Dg Chest Port 1 View  Result Date: 11/11/2018 CLINICAL DATA:  Follow-up aspiration EXAM: PORTABLE CHEST 1 VIEW COMPARISON:  11/10/2018 FINDINGS: Cardiac shadow is stable. Endotracheal tube and gastric catheter are noted in satisfactory position. Right-sided PICC line is noted at the cavoatrial junction. Patchy infiltrates are again seen bilaterally stable from the prior exam. No pneumothorax is noted. IMPRESSION: No change from the previous day. Electronically Signed   By: Inez Catalina M.D.   On: 11/11/2018 07:52   Dg Chest Port 1 View  Result Date: 11/10/2018 CLINICAL DATA:  Intubation.  OG tube placement. EXAM: PORTABLE CHEST 1 VIEW COMPARISON:  November 09, 2018 FINDINGS: The ETT remains in good position, 2 cm above the carina. Bilateral pulmonary infiltrates remain, similar on the left and more prominent on the right in the interval. The OG tube terminates below today's film. No other changes. IMPRESSION: Support apparatus as above. Bilateral pulmonary infiltrates are stable on the left and mildly more prominent on the right in the interval. Electronically Signed   By: Dorise Bullion  III M.D   On: 11/10/2018 16:27   Dg Chest Port 1 View  Result Date: 11/09/2018 CLINICAL DATA:  53 year old female positive COVID-19 status post PICC placement. EXAM: PORTABLE CHEST 1 VIEW COMPARISON:  Chest radiograph dated 11/04/2018 FINDINGS: A right-sided PICC is noted with tip over the right subclavian vein. Recommend further advancing by an additional approximately 10 cm. Endotracheal tube remains above the carina in similar position. Enteric tube extends below the diaphragm likely within the stomach. Interval progression of bilateral confluent densities since the prior radiograph. No large pleural effusion. There is no pneumothorax. No acute osseous pathology. Stable cardiac silhouette. IMPRESSION: 1. Right-sided PICC with tip over the right subclavian vein. Recommend further advancing an additional 10 cm. 2. Interval progression of bilateral confluent densities. Electronically Signed   By: Anner Crete M.D.   On: 11/09/2018 03:25   Dg Abd Portable 1v  Result Date: 11/29/2018 CLINICAL DATA:  Dysphagia EXAM: PORTABLE ABDOMEN - 1 VIEW COMPARISON:  Plain film of the abdomen dated 11/21/2018. FINDINGS: Enteric tube appears adequately positioned in the stomach. Visualized bowel gas pattern is nonobstructive. No evidence of free intraperitoneal air. IMPRESSION: Enteric tube appears adequately positioned in the stomach. Electronically Signed   By: Franki Cabot M.D.   On: 11/29/2018 11:29   Dg Abd Portable 1v  Result Date: 11/21/2018 CLINICAL DATA:  NG tube placement. EXAM: PORTABLE ABDOMEN - 1 VIEW COMPARISON:  Plain films of the  abdomen dated 11/20/2018 and 11/19/2018. FINDINGS: Weighted tip feeding tube now appears appropriately positioned with tip in the proximal duodenum. Visualized bowel gas pattern is nonobstructive. IMPRESSION: Weighted tip feeding tube now appears appropriately positioned with tip in the proximal duodenum. Electronically Signed   By: Franki Cabot M.D.   On: 11/21/2018 12:24     Dg Abd Portable 1v  Result Date: 11/20/2018 CLINICAL DATA:  Encounter for NG tube placement EXAM: PORTABLE ABDOMEN - 1 VIEW COMPARISON:  11/19/2018 FINDINGS: Feeding tube coils in the stomach with the tip in the fundus. Nonobstructive bowel gas pattern. No organomegaly or free air. IMPRESSION: Feeding tube coils in the stomach with the tip in the fundus. Electronically Signed   By: Rolm Baptise M.D.   On: 11/20/2018 19:57   Dg Abd Portable 1v  Result Date: 11/19/2018 CLINICAL DATA:  Vomiting. EXAM: PORTABLE ABDOMEN - 1 VIEW COMPARISON:  None. FINDINGS: A feeding tube coils in the stomach with the distal tip in the fundus. No evidence of bowel obstruction. IMPRESSION: A feeding tube coils in the stomach. No evidence of bowel obstruction. No cause for vomiting noted. Electronically Signed   By: Dorise Bullion III M.D   On: 11/19/2018 16:43   Dg Swallowing Func-speech Pathology  Result Date: 12/01/2018 Objective Swallowing Evaluation: Type of Study: MBS-Modified Barium Swallow Study  Patient Details Name: Zyia Kaneko MRN: [id number] Date of Birth: 1966-02-28 Today's Date: 12/01/2018 Time: SLP Start Time (ACUTE ONLY): 1050 -SLP Stop Time (ACUTE ONLY): 1122 SLP Time Calculation (min) (ACUTE ONLY): 32 min Past Medical History: No past medical history on file. Past Surgical History: No past surgical history on file. HPI: Pt adm 5/20 with hypoxic resp failure due to Covid 19. Pt intubated 5/20 and extubated 6/8, with re-intubation 6/8; Trach 6/11. PMH - none.  Subjective: alert, distractible Assessment / Plan / Recommendation CHL IP CLINICAL IMPRESSIONS 12/01/2018 Clinical Impression Pt demonstrates moderate oropharyngeal dysphagia following prolonged critical illness and intubation. 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 PMSV in place throught, pt on 2 liters [place]. Pt has relatively good oral function despite lingual wound that is healing well.  LIngual manipulation for mastication, bolus formation and propulsion appears a little slow but adequate. However, premature spillage and delayed swallow initiation do lead to prolonged pooling of liquids in pyriform sinsues prior to swallow initiation. WIth thin liquids penetration and aspiration via the postirior commisure is failry consistent, no response from pt but a cued cough ejects. Chin tuck not beneficial. Nectar thick liquids and solids were not aspirated but there was mild to moderate diffuse pharyngeal residual suggesting mild generalized muscle weakness. Pt able to follow cue to swallow twice. Recommend a dys 3 mech soft) diet with nectar thick liquids and intermittent instruction to swallow again. Will follow for tolerance.  SLP Visit Diagnosis Dysphagia, oropharyngeal phase (R13.12) Attention and concentration deficit following -- Frontal lobe and executive function deficit following -- Impact on safety and function Moderate aspiration risk   CHL IP TREATMENT RECOMMENDATION 12/01/2018 Treatment Recommendations Therapy as outlined in treatment plan below   Prognosis 12/01/2018 Prognosis for Safe Diet Advancement Good Barriers to Reach Goals Cognitive deficits Barriers/Prognosis Comment -- CHL IP DIET RECOMMENDATION 12/01/2018 SLP Diet Recommendations Dysphagia 3 (Mech soft) solids;Nectar thick liquid Liquid Administration via Cup;Straw Medication Administration Whole meds with liquid Compensations Slow rate;Small sips/bites;Follow solids with liquid;Multiple dry swallows after each bite/sip Postural Changes Seated upright at 90 degrees   CHL IP OTHER RECOMMENDATIONS  12/01/2018 Recommended Consults -- Oral Care Recommendations Oral care BID Other Recommendations Order thickener from pharmacy   CHL IP FOLLOW UP RECOMMENDATIONS 12/01/2018 Follow up Recommendations Inpatient Rehab   CHL IP FREQUENCY AND DURATION 12/01/2018 Speech Therapy Frequency (ACUTE ONLY) min 2x/week Treatment Duration 2 weeks      CHL IP  ORAL PHASE 12/01/2018 Oral Phase WFL Oral - Pudding Teaspoon -- Oral - Pudding Cup -- Oral - Honey Teaspoon -- Oral - Honey Cup -- Oral - Nectar Teaspoon -- Oral - Nectar Cup -- Oral - Nectar Straw -- Oral - Thin Teaspoon -- Oral - Thin Cup -- Oral - Thin Straw -- Oral - Puree -- Oral - Mech Soft -- Oral - Regular -- Oral - Multi-Consistency -- Oral - Pill -- Oral Phase - Comment --  CHL IP PHARYNGEAL PHASE 12/01/2018 Pharyngeal Phase Impaired Pharyngeal- Pudding Teaspoon -- Pharyngeal -- Pharyngeal- Pudding Cup -- Pharyngeal -- Pharyngeal- Honey Teaspoon -- Pharyngeal -- Pharyngeal- Honey Cup -- Pharyngeal -- Pharyngeal- Nectar Teaspoon -- Pharyngeal -- Pharyngeal- Nectar Cup Delayed swallow initiation-pyriform sinuses;Reduced tongue base retraction;Reduced laryngeal elevation;Reduced anterior laryngeal mobility;Reduced epiglottic inversion;Reduced pharyngeal peristalsis;Pharyngeal residue - valleculae;Pharyngeal residue - pyriform Pharyngeal -- Pharyngeal- Nectar Straw Delayed swallow initiation-pyriform sinuses;Reduced tongue base retraction;Reduced laryngeal elevation;Reduced anterior laryngeal mobility;Reduced epiglottic inversion;Reduced pharyngeal peristalsis;Pharyngeal residue - valleculae;Pharyngeal residue - pyriform Pharyngeal -- Pharyngeal- Thin Teaspoon -- Pharyngeal -- Pharyngeal- Thin Cup Delayed swallow initiation-pyriform sinuses;Reduced airway/laryngeal closure;Penetration/Aspiration during swallow;Trace aspiration;Pharyngeal residue - pyriform Pharyngeal Material enters airway, CONTACTS cords and not ejected out;Material enters airway, passes BELOW cords without attempt by patient to eject out (silent aspiration) Pharyngeal- Thin Straw Delayed swallow initiation-pyriform sinuses;Reduced airway/laryngeal closure;Penetration/Aspiration during swallow;Trace aspiration;Pharyngeal residue - pyriform;Compensatory strategies attempted (with notebox) Pharyngeal Material enters airway, passes BELOW cords  without attempt by patient to eject out (silent aspiration);Material enters airway, CONTACTS cords and not ejected out Pharyngeal- Puree Delayed swallow initiation-pyriform sinuses;Reduced tongue base retraction;Reduced laryngeal elevation;Reduced anterior laryngeal mobility;Reduced epiglottic inversion;Reduced pharyngeal peristalsis;Pharyngeal residue - valleculae;Pharyngeal residue - pyriform Pharyngeal -- Pharyngeal- Mechanical Soft -- Pharyngeal -- Pharyngeal- Regular Delayed swallow initiation-pyriform sinuses;Reduced tongue base retraction;Reduced laryngeal elevation;Reduced anterior laryngeal mobility;Reduced epiglottic inversion;Reduced pharyngeal peristalsis;Pharyngeal residue - valleculae;Pharyngeal residue - pyriform Pharyngeal -- Pharyngeal- Multi-consistency -- Pharyngeal -- Pharyngeal- Pill -- Pharyngeal -- Pharyngeal Comment --  No flowsheet data found. DeBlois, Katherene Ponto 12/01/2018, 12:02 PM              [place] Ekg Site Rite  Result Date: 11/09/2018 If [facility] image not attached, placement could not be confirmed due to current cardiac rhythm.

## 2018-12-05 NOTE — Progress Notes (Signed)
Physical Therapy Treatment Patient Details Name: Shannon BradfordDeborah Dziedzic MRN: [id number] DOB: 03/29/1966 Today's Date: 12/05/2018    History of Present Illness Pt adm 5/20 with hypoxic resp failure due to Covid 19. Pt intubated 5/20 and extubated 6/8, with re-intubation 6/8; Trach 6/11. PMH - none. 6/17 foul smelling trach site/secretions, elevated WBC and temp; vomited tube feeds.    PT Comments    Patient improved in standing  Posture this visit. Will begin use of RW for ambulation next visit.   Follow Up Recommendations  CIR     Equipment Recommendations  Other (comment)    Recommendations for Other Services Rehab consult     Precautions / Restrictions Precautions Precautions: Fall Precaution Comments: monitor vitals    Mobility  Bed Mobility   Bed Mobility: Rolling Rolling: Supervision Sidelying to sit: Min assist;Min guard   Sit to supine: Supervision   General bed mobility comments: no assist for return supine, she does move very quickly so needs close guarding, almost  ballistic  Transfers Overall transfer level: Needs assistance   Transfers: Sit to/from Stand Sit to Stand: Min guard         General transfer comment: [facility]. stood straight up and did not lean forward. sat down on stedy flaps and sat straight, no leaning forward.  Assisted to bed.  Ambulation/Gait                 Stairs             Wheelchair Mobility    Modified Rankin (Stroke Patients Only)       Balance   Sitting-balance support: Bilateral upper extremity supported;Feet supported Sitting balance-[name] Scale: Fair     Standing balance support: During functional activity;Bilateral upper extremity supported Standing balance-[name] Scale: Poor Standing balance comment: in stedy. leans forward  over bar.                            Cognition Arousal/Alertness: Awake/alert Behavior During Therapy: Impulsive;WFL for tasks  assessed/performed Overall Cognitive Status: Impaired/Different from baseline Area of Impairment: Attention;Memory;Safety/judgement;Awareness                         Safety/Judgement: Decreased awareness of safety;Decreased awareness of deficits Awareness: Emergent Problem Solving: Slow processing General Comments: Patient stated that she felt sorry for those patients who are sick, that she feels fortunate to be getting better.      Exercises      General Comments        Pertinent Vitals/Pain Pain Assessment: No/denies pain    Home Living                      Prior Function            PT Goals (current goals can now be found in the care plan section) Progress towards PT goals: Progressing toward goals    Frequency    Min 3X/week      PT Plan Current plan remains appropriate    Co-evaluation              AM-PAC PT "6 Clicks" Mobility   Outcome Measure  Help needed turning from your back to your side while in a flat bed without using bedrails?: A Little Help needed moving from lying on your back to sitting on the side of a flat bed without using bedrails?:  A Little Help needed moving to and from a bed to a chair (including a wheelchair)?: A Lot Help needed standing up from a chair using your arms (e.g., wheelchair or bedside chair)?: A Lot Help needed to walk in hospital room?: Total Help needed climbing 3-5 steps with a railing? : Total 6 Click Score: 12    End of Session Equipment Utilized During Treatment: Gait belt;Oxygen Activity Tolerance: Patient tolerated treatment well Patient left: in bed;with call bell/phone within reach Nurse Communication: Mobility status;Need for lift equipment;Other (comment) PT Visit Diagnosis: Other abnormalities of gait and mobility (R26.89);Muscle weakness (generalized) (M62.81)     Time: 4801-6553 PT Time Calculation (min) (ACUTE ONLY): 25 min  Charges:  $Therapeutic Activity: 23-37 mins                      [facility] Pager [phone] Office [phone]    Claretha Cooper 12/05/2018, 3:54 PM

## 2018-12-05 NOTE — Progress Notes (Signed)
Physical Therapy Treatment Patient Details Name: Shannon Meyer MRN: [id number] DOB: [birth year] Today's Date: 12/05/2018    History of Present Illness Pt adm 5/20 with hypoxic resp failure due to Covid 19. Pt intubated 5/20 and extubated 6/8, with re-intubation 6/8; Trach 6/11. PMH - none. 6/17 foul smelling trach site/secretions, elevated WBC and temp; vomited tube feeds.    PT Comments    The patient is progressing daily in mobility. Requires encouragement to advance activities such as standing ,, does tend have short attention span with activities. Patient monitored on @ [facility] [place] with forehaed probe at 100% and finger probe at *=85%. Patient does fatiigue easily with standing. Patient will benefit from CIR.   Follow Up Recommendations  CIR     Equipment Recommendations  Other (comment)    Recommendations for Other Services Rehab consult     Precautions / Restrictions Precautions Precautions: Fall Precaution Comments: monitor vitals    Mobility  Bed Mobility   Bed Mobility: Rolling Rolling: Supervision Sidelying to sit: Min assist;Min guard       General bed mobility comments: very little assistance required.  Transfers Overall transfer level: Needs assistance     Sit to Stand: Min assist;From elevated surface         General transfer comment: Encouraged  Pt. to remain standing as long as possible. she immediately sits down except on 4 th stand, she did stand x 30".Stood x 2 from bed and x 2 from recliner.Encouraged  upright posture as patient tends to lean forward over the bar.Decreased attention span noted as patient then was ready for her tray and she did not want to stand again.  Ambulation/Gait                 Stairs             Wheelchair Mobility    Modified Rankin (Stroke Patients Only)       Balance   Sitting-balance support: Bilateral upper extremity supported;Feet supported Sitting balance-[name] Scale: Fair     Standing balance  support: During functional activity;Bilateral upper extremity supported Standing balance-[name] Scale: Poor Standing balance comment: in stedy. leans forward  over bar.                            Cognition Arousal/Alertness: Awake/alert Behavior During Therapy: Impulsive                             Safety/Judgement: Decreased awareness of safety;Decreased awareness of deficits Awareness: Emergent Problem Solving: Slow processing General Comments: patient states that she wants to go home. That her sister can take care of her. Discussed  need for more therpay.      Exercises      General Comments        Pertinent Vitals/Pain Pain Assessment: No/denies pain    Home Living                      Prior Function            PT Goals (current goals can now be found in the care plan section) Progress towards PT goals: Progressing toward goals    Frequency    Min 3X/week      PT Plan Current plan remains appropriate    Co-evaluation              AM-PAC PT "6 Clicks" Mobility  Outcome Measure  Help needed turning from your back to your side while in a flat bed without using bedrails?: A Little Help needed moving from lying on your back to sitting on the side of a flat bed without using bedrails?: A Little Help needed moving to and from a bed to a chair (including a wheelchair)?: A Lot Help needed standing up from a chair using your arms (e.g., wheelchair or bedside chair)?: A Lot Help needed to walk in hospital room?: Total Help needed climbing 3-5 steps with a railing? : Total 6 Click Score: 12    End of Session Equipment Utilized During Treatment: Gait belt;Oxygen Activity Tolerance: Patient tolerated treatment well Patient left: in chair;with chair alarm set;with call bell/phone within reach Nurse Communication: Mobility status;Need for lift equipment;Other (comment) PT Visit Diagnosis: Other abnormalities of gait and mobility  (R26.89);Muscle weakness (generalized) (M62.81)     Time: [id number] PT Time Calculation (min) (ACUTE ONLY): 33 min  Charges:  $Therapeutic Activity: 23-37 mins                     Blanchard KelchKaren Hailly Fess PT Acute Rehabilitation Services [phone]  Office [phone]    Rada HayHill, Kamir Selover Elizabeth 12/05/2018, 1:36 PM

## 2018-12-05 NOTE — Plan of Care (Signed)
  Problem: Education: Goal: Knowledge of General Education information will improve Description: Including pain rating scale, medication(s)/side effects and non-pharmacologic comfort measures Outcome: Progressing   Problem: Clinical Measurements: Goal: Ability to maintain clinical measurements within normal limits will improve Outcome: Progressing Goal: Diagnostic test results will improve Outcome: Progressing Goal: Cardiovascular complication will be avoided Outcome: Progressing   Problem: Activity: Goal: Risk for activity intolerance will decrease Outcome: Progressing   Problem: Nutrition: Goal: Adequate nutrition will be maintained Outcome: Progressing   Problem: Pain Managment: Goal: General experience of comfort will improve Outcome: Progressing   Problem: Safety: Goal: Ability to remain free from injury will improve Outcome: Progressing   Problem: Skin Integrity: Goal: Risk for impaired skin integrity will decrease Outcome: Progressing   Problem: Education: Goal: Knowledge of risk factors and measures for prevention of condition will improve Outcome: Progressing   Problem: Coping: Goal: Psychosocial and spiritual needs will be supported Outcome: Progressing   Problem: Respiratory: Goal: Will maintain a patent airway Outcome: Progressing   Problem: Respiratory: Goal: Patent airway maintenance will improve Outcome: Progressing

## 2018-12-06 LAB — C-REACTIVE PROTEIN: CRP: 1.9 mg/dL — ABNORMAL HIGH (ref <1.0)

## 2018-12-06 LAB — CBC WITH DIFFERENTIAL/PLATELET
Abs Immature Granulocytes: 0.05 10*3/uL (ref 0.00–0.07)
Basophils Absolute: 0.1 10*3/uL (ref 0.0–0.1)
Basophils Relative: 1 %
Eosinophils Absolute: 1 10*3/uL — ABNORMAL HIGH (ref 0.0–0.5)
Eosinophils Relative: 8 %
HCT: 30.9 % — ABNORMAL LOW (ref 36.0–46.0)
Hemoglobin: 9.3 g/dL — ABNORMAL LOW (ref 12.0–15.0)
Immature Granulocytes: 0 %
Lymphocytes Relative: 38 %
Lymphs Abs: 4.6 10*3/uL — ABNORMAL HIGH (ref 0.7–4.0)
MCH: 29 pg (ref 26.0–34.0)
MCHC: 30.1 g/dL (ref 30.0–36.0)
MCV: 96.3 fL (ref 80.0–100.0)
Monocytes Absolute: 1.6 10*3/uL — ABNORMAL HIGH (ref 0.1–1.0)
Monocytes Relative: 13 %
Neutro Abs: 4.8 10*3/uL (ref 1.7–7.7)
Neutrophils Relative %: 40 %
Platelets: 281 10*3/uL (ref 150–400)
RBC: 3.21 MIL/uL — ABNORMAL LOW (ref 3.87–5.11)
RDW: 17 % — ABNORMAL HIGH (ref 11.5–15.5)
WBC: 12.2 10*3/uL — ABNORMAL HIGH (ref 4.0–10.5)
nRBC: 0 % (ref 0.0–0.2)

## 2018-12-06 LAB — MAGNESIUM: Magnesium: 2.2 mg/dL (ref 1.7–2.4)

## 2018-12-06 LAB — FERRITIN: Ferritin: 37 ng/mL (ref 11–307)

## 2018-12-06 LAB — D-DIMER, QUANTITATIVE: D-Dimer, Quant: 0.64 ug/mL-FEU — ABNORMAL HIGH (ref 0.00–0.50)

## 2018-12-06 NOTE — Progress Notes (Signed)
PROGRESS NOTE                                                                                                                                                                                                             Patient Demographics:    Shannon Meyer, is a 53 y.o. female, DOB - 1965/11/14, ZDG:[id number]  Outpatient Primary MD for the patient is No primary care provider on file.    LOS - 45  Chief Complaint  Patient presents with   Weakness       Brief Narrative: Patient is a 53 y.o. female with no significant past medical history-presented with shortness of breath-found to have acute hypoxic respiratory failure secondary to COVID-19 pneumonia-intubated in the emergency room and admitted to [facility].  She had a prolonged hospital course-she was difficult to wean off the ventilator and underwent tracheostomy.  She also had pseudomonal pneumonia and was treated with antimicrobial therapy.  She continues to slowly improve-with decreasing FiO2 requirements, PCCM following for trach care.   Subjective:    Shannon Meyer today wants to go home-she has no major issues overnight.  Denies any chest pain or shortness of breath.  Looks comfortable.   Assessment  & Plan :   Acute Hypoxic Resp Failure due to Covid 19 Viral pneumonia: Much improved-PCCM following with plans to cap trach later today.  COVID-19 Labs:  Recent Labs    12/05/18 0713 12/06/18 0245  DDIMER 0.72* 0.64*  FERRITIN 38 37  CRP 2.5* 1.9*    Lab Results  Component Value Date   SARSCOV2NAA NOT DETECTED 12/03/2018   SARSCOV2NAA POSITIVE (A) 10/22/2018     COVID-19 Medications: Remdesivir>> 5/21 Actemra>> 5/20 and 5/21  Pseudomonas pneumonia: Improved-completed Fortaz-afebrile and not short of breath.  Acute encephalopathy/delirium: Resolved-completely awake and alert.  Continue to minimize narcotics and sedatives as much as  possible.  Brief run of PAF on 6/24: Remains in sinus rhythm-prior MD-Dr. Thedore Mins discussed with Dr. Jory Sims aspirin and metoprolol-recommendations are for outpatient echocardiogram and outpatient cardiology follow-up.  Deconditioning/debility: PT following-recommendations are for CIR.  Patient really wants to go home-we will watch over the weekend and reassess on Monday.  Tongue laceration: Resolved-seems to have healed well-doubt any further follow-up required.  Suspected to be injured during intubation.  Obesity  ABG:    Component Value Date/Time  PHART 7.424 11/22/2018 2116   PCO2ART 41.5 11/22/2018 2116   PO2ART 65.0 (L) 11/22/2018 2116   HCO3 27.1 11/22/2018 2116   TCO2 28 11/22/2018 2116   ACIDBASEDEF 1.0 10/24/2018 0419   O2SAT 92.0 11/22/2018 2116    Condition - Stable  Family Communication  : Left voicemail for daughter  Code Status : Full code  Diet :  Diet Order            DIET DYS 3 Room service appropriate? Yes with Assist; Fluid consistency: Thin  Diet effective now               Disposition Plan  :  Remain inpatient  Consults  :  PCCM  Procedures  :    ETT>> 5/20-6/11 Tracheostomy>> 6/11 Right arm PICC line>> 6/7  GI prophylaxis: PPI/H2 Blocker  DVT Prophylaxis  :   Heparin  Lab Results  Component Value Date   PLT 281 12/06/2018    Inpatient Medications  Scheduled Meds:  aspirin EC  81 mg Oral Daily   chlorhexidine  15 mL Mouth/Throat BID   Chlorhexidine Gluconate Cloth  6 each Topical Daily   famotidine  20 mg Oral QHS   feeding supplement (ENSURE ENLIVE)  237 mL Oral TID BM   feeding supplement (PRO-STAT SUGAR FREE 64)  30 mL Oral TID WC   heparin injection (subcutaneous)  7,500 Units Subcutaneous Q8H   mouth rinse  15 mL Mouth Rinse QID   metoprolol tartrate  50 mg Oral BID   sodium chloride flush  10-40 mL Intracatheter Q12H   Continuous Infusions: PRN Meds:.acetaminophen, hydrALAZINE, ipratropium-albuterol,  metoprolol tartrate, ondansetron (ZOFRAN) IV, oxymetazoline, polyethylene glycol, QUEtiapine, Resource ThickenUp Clear, sennosides  Antibiotics  :    Anti-infectives (From admission, onward)   Start     Dose/Rate Route Frequency Ordered Stop   11/21/18 2200  cefTAZidime (FORTAZ) 2 g in sodium chloride 0.9 % 100 mL IVPB     2 g 200 mL/hr over 30 Minutes Intravenous Every 8 hours 11/21/18 1426 11/28/18 0656   11/21/18 1400  cefTAZidime (FORTAZ) 2 g in sodium chloride 0.9 % 100 mL IVPB  Status:  Discontinued     2 g 200 mL/hr over 30 Minutes Intravenous Every 8 hours 11/21/18 1330 11/21/18 1426   11/20/18 0400  vancomycin (VANCOCIN) IVPB 1000 mg/200 mL premix  Status:  Discontinued     1,000 mg 200 mL/hr over 60 Minutes Intravenous Every 12 hours 11/19/18 1517 11/20/18 1344   11/19/18 1530  vancomycin (VANCOCIN) 1,500 mg in sodium chloride 0.9 % 500 mL IVPB     1,500 mg 250 mL/hr over 120 Minutes Intravenous  Once 11/19/18 1517 11/19/18 1744   11/19/18 1530  piperacillin-tazobactam (ZOSYN) IVPB 3.375 g     3.375 g 12.5 mL/hr over 240 Minutes Intravenous Every 8 hours 11/19/18 1517 11/21/18 1734   11/01/18 1000  fluconazole (DIFLUCAN) IVPB 100 mg     100 mg 50 mL/hr over 60 Minutes Intravenous Every 24 hours 11/01/18 0857 11/03/18 1137   10/27/18 2200  cefTRIAXone (ROCEPHIN) 2 g in sodium chloride 0.9 % 100 mL IVPB     2 g 200 mL/hr over 30 Minutes Intravenous Every 24 hours 10/27/18 0835 10/28/18 2243   10/24/18 1400  remdesivir 100 mg in sodium chloride 0.9 % 250 mL IVPB  Status:  Discontinued     100 mg over 30 Minutes Intravenous Every 24 hours 10/23/18 1153 10/24/18 1335   10/24/18 1400  remdesivir 100 mg  in sodium chloride 0.9 % 220 mL IVPB  Status:  Discontinued     100 mg over 30 Minutes Intravenous Every 24 hours 10/24/18 1334 10/24/18 1336   10/24/18 1400  remdesivir 100 mg in sodium chloride 0.9 % 230 mL IVPB     100 mg over 30 Minutes Intravenous Every 24 hours 10/24/18  1335 11/01/18 1500   10/24/18 1400  remdesivir 100 mg in sodium chloride 0.9 % 230 mL IVPB  Status:  Discontinued     100 mg over 30 Minutes Intravenous Every 24 hours 10/24/18 1337 10/24/18 1339   10/23/18 1400  remdesivir 200 mg in sodium chloride 0.9 % 250 mL IVPB     200 mg over 30 Minutes Intravenous Once 10/23/18 1153 10/23/18 1738   10/22/18 2200  azithromycin (ZITHROMAX) 500 mg in sodium chloride 0.9 % 250 mL IVPB     500 mg 250 mL/hr over 60 Minutes Intravenous Every 24 hours 10/22/18 2148 10/26/18 2255   10/22/18 2200  cefTRIAXone (ROCEPHIN) 1 g in sodium chloride 0.9 % 100 mL IVPB  Status:  Discontinued     1 g 200 mL/hr over 30 Minutes Intravenous Every 24 hours 10/22/18 2148 10/27/18 0835       Time Spent in minutes  25   Jeoffrey Massed M.D on 12/06/2018 at 3:25 PM  To page go to [email or web address] - use universal password  [facility] -  Office  [phone]   Admit date - 10/22/2018    45    Objective:   Vitals:   12/05/18 2100 12/06/18 0514 12/06/18 0807 12/06/18 0850  BP:  121/66 102/60   Pulse: 98 75 88   Resp: (!) 24   (!) 22  Temp:  98.8 F (37.1 C) 98.5 F (36.9 C)   TempSrc:  Oral Oral   SpO2: 97% 92% 94%   Weight:  89.5 kg    Height:        Wt Readings from Last 3 Encounters:  12/06/18 89.5 kg     Intake/Output Summary (Last 24 hours) at 12/06/2018 1525 Last data filed at 12/05/2018 1854 Gross per 24 hour  Intake 360 ml  Output --  Net 360 ml     Physical Exam Gen Exam:Alert awake-not in any distress HEENT:atraumatic, normocephalic Chest: B/L clear to auscultation anteriorly CVS:S1S2 regular Abdomen:soft non tender, non distended Extremities:no edema Neurology: Non focal Skin: no rash   Data Review:    CBC Recent Labs  Lab 12/01/18 0700 12/02/18 0610 12/03/18 0330 12/05/18 0713 12/06/18 0245  WBC 15.1* 11.8* 12.1* 10.8* 12.2*  HGB 11.9* 11.8* 11.8* 10.0* 9.3*  HCT 39.8 39.0 39.9 32.9* 30.9*  PLT 345 334 348 276 281   MCV 96.8 96.1 97.1 95.6 96.3  MCH 29.0 29.1 28.7 29.1 29.0  MCHC 29.9* 30.3 29.6* 30.4 30.1  RDW 17.6* 17.6* 17.2* 17.2* 17.0*  LYMPHSABS 4.4* 3.2 4.0 3.8 4.6*  MONOABS 1.4* 1.4* 1.9* 1.4* 1.6*  EOSABS 0.5 0.6* 0.7* 0.8* 1.0*  BASOSABS 0.1 0.1 0.1 0.1 0.1    Chemistries  Recent Labs  Lab 11/30/18 0645 12/01/18 0700 12/02/18 0610 12/03/18 0330 12/05/18 0713 12/06/18 0245  NA 137 138 138 136 138  --   K 4.7 3.8 3.5 3.4* 3.9  --   CL 97* 97* 97* 97* 101  --   CO2 --   GLUCOSE 92 117* 113* 94 98  --   BUN 21* --  CREATININE 0.54 0.41* 0.56 0.53 0.58  --   CALCIUM 8.8* 9.3 9.1 9.0 8.8*  --   MG 2.6* 2.2 2.2 2.3 2.3 2.2  AST 57* 36 37 39  --   --   ALT 38 38 38 42  --   --   ALKPHOS 126 127* 118 121  --   --   BILITOT 0.7 0.4 0.2* 0.3  --   --    ------------------------------------------------------------------------------------------------------------------ No results for input(s): CHOL, HDL, LDLCALC, TRIG, CHOLHDL, LDLDIRECT in the last 72 hours.  No results found for: HGBA1C ------------------------------------------------------------------------------------------------------------------ No results for input(s): TSH, T4TOTAL, T3FREE, THYROIDAB in the last 72 hours.  Invalid input(s): FREET3 ------------------------------------------------------------------------------------------------------------------ Recent Labs    12/05/18 0713 12/06/18 0245  FERRITIN 38 37    Coagulation profile Recent Labs  Lab 12/04/18 0500  INR 1.0    Recent Labs    12/05/18 0713 12/06/18 0245  DDIMER 0.72* 0.64*    Cardiac Enzymes No results for input(s): CKMB, TROPONINI, MYOGLOBIN in the last 168 hours.  Invalid input(s): CK ------------------------------------------------------------------------------------------------------------------    Component Value Date/Time   BNP 89.5 11/28/2018 0300    Micro Results Recent Results (from the  past 240 hour(s))  MRSA PCR Screening     Status: None   Collection Time: 11/28/18  2:00 PM   Specimen: Nasal Mucosa; Nasopharyngeal  Result Value Ref Range Status   MRSA by PCR NEGATIVE NEGATIVE Final    Comment:        The GeneXpert MRSA Assay (FDA approved for NASAL specimens only), is one component of a comprehensive MRSA colonization surveillance program. It is not intended to diagnose MRSA infection nor to guide or monitor treatment for MRSA infections. Performed at [facility], [address] [place], [place] [address]   Novel Coronavirus, NAA (hospital order; send-out to ref lab)     Status: None   Collection Time: 12/03/18  8:07 AM   Specimen: Nasopharyngeal Swab; Respiratory  Result Value Ref Range Status   SARS-CoV-2, NAA NOT DETECTED NOT DETECTED Final    Comment: (NOTE) This test was developed and its performance characteristics determined by [facility]. This test has not been FDA cleared or approved. This test has been authorized by FDA under an Emergency Use Authorization (EUA). This test is only authorized for the duration of time the declaration that circumstances exist justifying the authorization of the emergency use of in vitro diagnostic tests for detection of SARS-CoV-2 virus and/or diagnosis of COVID-19 infection under section 564(b)(1) of the Act, 21 U.S.C. 604VWU-9(W)(1360bbb-3(b)(1), unless the authorization is terminated or revoked sooner. When diagnostic testing is negative, the possibility of a false negative result should be considered in the context of a patient's recent exposures and the presence of clinical signs and symptoms consistent with COVID-19. An individual without symptoms of COVID-19 and who is not shedding SARS-CoV-2 virus would expect to have a negative (not detected) result in this assay. Performed  At: [facility] [address] [place], [place] [id number] Jolene SchimkeNagendra Sanjai MD AO:[id number]:[phone]     Coronavirus Source NASOPHARYNGEAL  Final    Comment: Performed at [facility], [address] [place], [place] [address]    Radiology Reports Dg Abd 1 View  Result Date: 11/10/2018 CLINICAL DATA:  Orogastric placement EXAM: ABDOMEN - 1 VIEW COMPARISON:  None. FINDINGS: Orogastric tube enters the stomach, loops in the body and has its tip in the antrum. Bowel gas pattern appears normal. IMPRESSION: Orogastric tip in the  antrum. Electronically Signed   By: Paulina Fusi M.D.   On: 11/10/2018 16:50   Dg Chest Port 1 View  Result Date: 11/25/2018 CLINICAL DATA:  ARDS, COVID-19 positive EXAM: PORTABLE CHEST 1 VIEW COMPARISON:  Portable exam 0508 hours compared to 11/19/2018 FINDINGS: Tracheostomy tube unchanged. Tip of RIGHT arm PICC line projects over cavoatrial junction. Feeding tube extends to at least the second portion of the duodenum. Stable heart size. Diffuse BILATERAL airspace infiltrates, unchanged. No pleural effusion or pneumothorax. IMPRESSION: Persistent diffuse BILATERAL airspace infiltrates Electronically Signed   By: Ulyses Southward M.D.   On: 11/25/2018 08:08   Dg Chest Port 1 View  Result Date: 11/19/2018 CLINICAL DATA:  Fever EXAM: PORTABLE CHEST 1 VIEW COMPARISON:  11/13/2018, 11/11/2018 FINDINGS: Tracheostomy tube in satisfactory position. Feeding tube coiled in the stomach. Right-sided PICC line with the tip projecting over the SVC. Worsening bilateral patchy interstitial and alveolar airspace disease throughout bilateral lungs concerning for multilobar pneumonia. No pleural effusion or pneumothorax. Stable cardiomediastinal silhouette. No aggressive osseous lesion. IMPRESSION: 1. Support lines and tubing in satisfactory position. 2. Worsening bilateral patchy interstitial and alveolar airspace disease throughout bilateral lungs concerning for multilobar pneumonia. Electronically Signed   By: Elige Ko   On: 11/19/2018 13:38   Dg Chest Port 1 View  Result Date:  11/13/2018 CLINICAL DATA:  Tracheostomy placement.  COVID-19. EXAM: PORTABLE CHEST 1 VIEW COMPARISON:  11/11/2018. FINDINGS: Tracheostomy is been inserted. Tip lies 4.5 cm above carina. BILATERAL pulmonary opacities persist, LEFT greater than RIGHT, not significantly improved. Cardiomegaly. Feeding tube duodenum. IMPRESSION: Satisfactory tracheostomy placement. No significant improvement aeration. Electronically Signed   By: Elsie Stain M.D.   On: 11/13/2018 16:16   Dg Chest Port 1 View  Result Date: 11/11/2018 CLINICAL DATA:  Follow-up aspiration EXAM: PORTABLE CHEST 1 VIEW COMPARISON:  11/10/2018 FINDINGS: Cardiac shadow is stable. Endotracheal tube and gastric catheter are noted in satisfactory position. Right-sided PICC line is noted at the cavoatrial junction. Patchy infiltrates are again seen bilaterally stable from the prior exam. No pneumothorax is noted. IMPRESSION: No change from the previous day. Electronically Signed   By: Alcide Clever M.D.   On: 11/11/2018 07:52   Dg Chest Port 1 View  Result Date: 11/10/2018 CLINICAL DATA:  Intubation.  OG tube placement. EXAM: PORTABLE CHEST 1 VIEW COMPARISON:  November 09, 2018 FINDINGS: The ETT remains in good position, 2 cm above the carina. Bilateral pulmonary infiltrates remain, similar on the left and more prominent on the right in the interval. The OG tube terminates below today's film. No other changes. IMPRESSION: Support apparatus as above. Bilateral pulmonary infiltrates are stable on the left and mildly more prominent on the right in the interval. Electronically Signed   By: Gerome Sam III M.D   On: 11/10/2018 16:27   Dg Chest Port 1 View  Result Date: 11/09/2018 CLINICAL DATA:  53 year old female positive COVID-19 status post PICC placement. EXAM: PORTABLE CHEST 1 VIEW COMPARISON:  Chest radiograph dated 11/04/2018 FINDINGS: A right-sided PICC is noted with tip over the right subclavian vein. Recommend further advancing by an additional  approximately 10 cm. Endotracheal tube remains above the carina in similar position. Enteric tube extends below the diaphragm likely within the stomach. Interval progression of bilateral confluent densities since the prior radiograph. No large pleural effusion. There is no pneumothorax. No acute osseous pathology. Stable cardiac silhouette. IMPRESSION: 1. Right-sided PICC with tip over the right subclavian vein. Recommend further advancing an additional 10 cm. 2.  Interval progression of bilateral confluent densities. Electronically Signed   By: Elgie CollardArash  Radparvar M.D.   On: 11/09/2018 03:25   Dg Abd Portable 1v  Result Date: 11/29/2018 CLINICAL DATA:  Dysphagia EXAM: PORTABLE ABDOMEN - 1 VIEW COMPARISON:  Plain film of the abdomen dated 11/21/2018. FINDINGS: Enteric tube appears adequately positioned in the stomach. Visualized bowel gas pattern is nonobstructive. No evidence of free intraperitoneal air. IMPRESSION: Enteric tube appears adequately positioned in the stomach. Electronically Signed   By: Bary RichardStan  Maynard M.D.   On: 11/29/2018 11:29   Dg Abd Portable 1v  Result Date: 11/21/2018 CLINICAL DATA:  NG tube placement. EXAM: PORTABLE ABDOMEN - 1 VIEW COMPARISON:  Plain films of the abdomen dated 11/20/2018 and 11/19/2018. FINDINGS: Weighted tip feeding tube now appears appropriately positioned with tip in the proximal duodenum. Visualized bowel gas pattern is nonobstructive. IMPRESSION: Weighted tip feeding tube now appears appropriately positioned with tip in the proximal duodenum. Electronically Signed   By: Bary RichardStan  Maynard M.D.   On: 11/21/2018 12:24   Dg Abd Portable 1v  Result Date: 11/20/2018 CLINICAL DATA:  Encounter for NG tube placement EXAM: PORTABLE ABDOMEN - 1 VIEW COMPARISON:  11/19/2018 FINDINGS: Feeding tube coils in the stomach with the tip in the fundus. Nonobstructive bowel gas pattern. No organomegaly or free air. IMPRESSION: Feeding tube coils in the stomach with the tip in the fundus.  Electronically Signed   By: Charlett NoseKevin  Dover M.D.   On: 11/20/2018 19:57   Dg Abd Portable 1v  Result Date: 11/19/2018 CLINICAL DATA:  Vomiting. EXAM: PORTABLE ABDOMEN - 1 VIEW COMPARISON:  None. FINDINGS: A feeding tube coils in the stomach with the distal tip in the fundus. No evidence of bowel obstruction. IMPRESSION: A feeding tube coils in the stomach. No evidence of bowel obstruction. No cause for vomiting noted. Electronically Signed   By: Gerome Samavid  Williams III M.D   On: 11/19/2018 16:43   Dg Swallowing Func-speech Pathology  Result Date: 12/01/2018 Objective Swallowing Evaluation: Type of Study: MBS-Modified Barium Swallow Study  Patient Details Name: Orlean BradfordDeborah Houseworth MRN: [id number] Date of Birth: 03/23/1966 Today's Date: 12/01/2018 Time: SLP Start Time (ACUTE ONLY): 1050 -SLP Stop Time (ACUTE ONLY): 1122 SLP Time Calculation (min) (ACUTE ONLY): 32 min Past Medical History: No past medical history on file. Past Surgical History: No past surgical history on file. HPI: Pt adm 5/20 with hypoxic resp failure due to Covid 19. Pt intubated 5/20 and extubated 6/8, with re-intubation 6/8; Trach 6/11. PMH - none.  Subjective: alert, distractible Assessment / Plan / Recommendation CHL IP CLINICAL IMPRESSIONS 12/01/2018 Clinical Impression Pt demonstrates moderate oropharyngeal dysphagia following prolonged critical illness and intubation. 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 PMSV in place throught, pt on 2 liters [place]. Pt has relatively good oral function despite lingual wound that is healing well. LIngual manipulation for mastication, bolus formation and propulsion appears a little slow but adequate. However, premature spillage and delayed swallow initiation do lead to prolonged pooling of liquids in pyriform sinsues prior to swallow initiation. WIth thin liquids penetration and aspiration via the postirior commisure is failry consistent, no response from  pt but a cued cough ejects. Chin tuck not beneficial. Nectar thick liquids and solids were not aspirated but there was mild to moderate diffuse pharyngeal residual suggesting mild generalized muscle weakness. Pt able to follow cue to swallow twice. Recommend a dys 3 mech soft) diet with nectar thick liquids and intermittent instruction to swallow  again. Will follow for tolerance.  SLP Visit Diagnosis Dysphagia, oropharyngeal phase (R13.12) Attention and concentration deficit following -- Frontal lobe and executive function deficit following -- Impact on safety and function Moderate aspiration risk   CHL IP TREATMENT RECOMMENDATION 12/01/2018 Treatment Recommendations Therapy as outlined in treatment plan below   Prognosis 12/01/2018 Prognosis for Safe Diet Advancement Good Barriers to Reach Goals Cognitive deficits Barriers/Prognosis Comment -- CHL IP DIET RECOMMENDATION 12/01/2018 SLP Diet Recommendations Dysphagia 3 (Mech soft) solids;Nectar thick liquid Liquid Administration via Cup;Straw Medication Administration Whole meds with liquid Compensations Slow rate;Small sips/bites;Follow solids with liquid;Multiple dry swallows after each bite/sip Postural Changes Seated upright at 90 degrees   CHL IP OTHER RECOMMENDATIONS 12/01/2018 Recommended Consults -- Oral Care Recommendations Oral care BID Other Recommendations Order thickener from pharmacy   CHL IP FOLLOW UP RECOMMENDATIONS 12/01/2018 Follow up Recommendations Inpatient Rehab   CHL IP FREQUENCY AND DURATION 12/01/2018 Speech Therapy Frequency (ACUTE ONLY) min 2x/week Treatment Duration 2 weeks      CHL IP ORAL PHASE 12/01/2018 Oral Phase WFL Oral - Pudding Teaspoon -- Oral - Pudding Cup -- Oral - Honey Teaspoon -- Oral - Honey Cup -- Oral - Nectar Teaspoon -- Oral - Nectar Cup -- Oral - Nectar Straw -- Oral - Thin Teaspoon -- Oral - Thin Cup -- Oral - Thin Straw -- Oral - Puree -- Oral - Mech Soft -- Oral - Regular -- Oral - Multi-Consistency -- Oral - Pill -- Oral  Phase - Comment --  CHL IP PHARYNGEAL PHASE 12/01/2018 Pharyngeal Phase Impaired Pharyngeal- Pudding Teaspoon -- Pharyngeal -- Pharyngeal- Pudding Cup -- Pharyngeal -- Pharyngeal- Honey Teaspoon -- Pharyngeal -- Pharyngeal- Honey Cup -- Pharyngeal -- Pharyngeal- Nectar Teaspoon -- Pharyngeal -- Pharyngeal- Nectar Cup Delayed swallow initiation-pyriform sinuses;Reduced tongue base retraction;Reduced laryngeal elevation;Reduced anterior laryngeal mobility;Reduced epiglottic inversion;Reduced pharyngeal peristalsis;Pharyngeal residue - valleculae;Pharyngeal residue - pyriform Pharyngeal -- Pharyngeal- Nectar Straw Delayed swallow initiation-pyriform sinuses;Reduced tongue base retraction;Reduced laryngeal elevation;Reduced anterior laryngeal mobility;Reduced epiglottic inversion;Reduced pharyngeal peristalsis;Pharyngeal residue - valleculae;Pharyngeal residue - pyriform Pharyngeal -- Pharyngeal- Thin Teaspoon -- Pharyngeal -- Pharyngeal- Thin Cup Delayed swallow initiation-pyriform sinuses;Reduced airway/laryngeal closure;Penetration/Aspiration during swallow;Trace aspiration;Pharyngeal residue - pyriform Pharyngeal Material enters airway, CONTACTS cords and not ejected out;Material enters airway, passes BELOW cords without attempt by patient to eject out (silent aspiration) Pharyngeal- Thin Straw Delayed swallow initiation-pyriform sinuses;Reduced airway/laryngeal closure;Penetration/Aspiration during swallow;Trace aspiration;Pharyngeal residue - pyriform;Compensatory strategies attempted (with notebox) Pharyngeal Material enters airway, passes BELOW cords without attempt by patient to eject out (silent aspiration);Material enters airway, CONTACTS cords and not ejected out Pharyngeal- Puree Delayed swallow initiation-pyriform sinuses;Reduced tongue base retraction;Reduced laryngeal elevation;Reduced anterior laryngeal mobility;Reduced epiglottic inversion;Reduced pharyngeal peristalsis;Pharyngeal residue -  valleculae;Pharyngeal residue - pyriform Pharyngeal -- Pharyngeal- Mechanical Soft -- Pharyngeal -- Pharyngeal- Regular Delayed swallow initiation-pyriform sinuses;Reduced tongue base retraction;Reduced laryngeal elevation;Reduced anterior laryngeal mobility;Reduced epiglottic inversion;Reduced pharyngeal peristalsis;Pharyngeal residue - valleculae;Pharyngeal residue - pyriform Pharyngeal -- Pharyngeal- Multi-consistency -- Pharyngeal -- Pharyngeal- Pill -- Pharyngeal -- Pharyngeal Comment --  No flowsheet data found. DeBlois, Katherene Ponto 12/01/2018, 12:02 PM              [place] Ekg Site Rite  Result Date: 11/09/2018 If [facility] image not attached, placement could not be confirmed due to current cardiac rhythm.

## 2018-12-06 NOTE — Progress Notes (Signed)
NAME:  Shannon Meyer, MRN:  [id number], DOB:  Nov 28, 1965, LOS: 67 ADMISSION DATE:  10/22/2018, CONSULTATION DATE:  10/22/2018 REFERRING MD:  Sedonia Small, CHIEF COMPLAINT:  Dyspnea   Brief History   53 year old female with no past medical history admitted from [place] on Oct 22, 2018 due to ARDS from COVID-19 pneumonia.  Has had a prolonged hospitalization, required tracheostomy.  Liberated from the ventilator on 6/15.   Past Medical History  none  Significant Hospital Events   5/20 Intubated in ED and admitted to [facility] ICU 5/21-5/26 Weaning FIO2 and PEEP, diuresing 5/26-6/3 Tolerating weaning for 1-2 hours 6/7 weaning on pressure support ventilation again this morning 10/5 6/8 extubated, reintubated (tachypnea, tachycardia) 6/12 Trached 6/13 Trach collar all day  6/14 bleeding from tracheostomy site 6/17 48 hours off vent, fever > pseudomonas pneumonia 6/20 slightly more awake, remains off vent 6/26 transferred to med-surg floor 7/1 tracheostomy downsized to #4 cuffless, started on continuous speaking valve trials 7/4 capped tracheostomy  Consults:  PCCM  Procedures:  May 20 endotracheal tube> June 11 Tracheostomy June 11>  June 7 PICC line right arm>   Significant Diagnostic Tests:  Oct 28, 2018 CT angiogram chest no pulmonary embolism, bilateral atypical pneumonia consistent with COVID-19 CXR 6/23 - improving infiltrates  Micro Data:  May 20 SARS-CoV-2 positive May 20 blood culture> 1 out of 2 GPC coag negative staph June 17 respiratory culture> pseudomonas aeruginosa  Antimicrobials:  Zithromax 5/20 >>5/24 Rocephin 5/20 >>5/26 Remdesivir 5/21 >5/30 Actemra 5/20>5/21 Solumedrol 5/12 >5/26  Vanc 6/17 > x1 Zosyn 6/17 > 6/19 Ceftaz 6/17 > 6/25  Interim history/subjective:   Has remained on speaking valve trials   Objective   Blood pressure 102/60, pulse 88, temperature 98.5 F (36.9 C), temperature source Oral, resp. rate (!)  22, height 5\' 4"  (1.626 m), weight 89.5 kg, SpO2 94 %.        Intake/Output Summary (Last 24 hours) at 12/06/2018 1150 Last data filed at 12/05/2018 1854 Gross per 24 hour  Intake 600 ml  Output -  Net 600 ml   Filed Weights   12/04/18 0500 12/05/18 0500 12/06/18 0514  Weight: 85.2 kg 89.3 kg 89.5 kg    Examination:  General:  Resting comfortably in bed HENT: NCAT tracheostomy in place PULM: CTA B, normal effort CV: RRR, no mgr GI: BS+, soft, nontender MSK: normal bulk and tone Neuro: awake, alert, no distress, MAEW    Resolved Hospital Problem list   Pseudomonas pneumonia:  Assessment & Plan:  ARDS due to COVID-19 pneumonia: Resolving Continue to wean off oxygen  Tracheostomy status: could likely decannulate Cap tracheostomy today, if tolerates could decannulate in next 24-48 hours Otherwise trach care per routine    Best practice:  Diet: advance Pain/Anxiety/Delirium protocol (if indicated): n/a VAP protocol (if indicated): n/a DVT prophylaxis: sub q hep GI prophylaxis: n/a Glucose control: per TRH Mobility: out of bed, PT consult Code Status: full Family Communication:  Per [facility] Disposition: consider [facility]  Labs   CBC: Recent Labs  Lab 12/01/18 0700 12/02/18 0610 12/03/18 0330 12/05/18 0713 12/06/18 0245  WBC 15.1* 11.8* 12.1* 10.8* 12.2*  NEUTROABS 8.5* 6.4 5.3 4.7 4.8  HGB 11.9* 11.8* 11.8* 10.0* 9.3*  HCT 39.8 39.0 39.9 32.9* 30.9*  MCV 96.8 96.1 97.1 95.6 96.3  PLT 345 334 348 276 093    Basic Metabolic Panel: Recent Labs  Lab 11/30/18 0645 12/01/18 0700 12/02/18 0610 12/03/18 0330 12/05/18 0713 12/06/18 0245  NA 137 138 138 136 138  --   K 4.7 3.8 3.5 3.4* 3.9  --   CL 97* 97* 97* 97* 101  --   CO2 26 31 29 30 28   --   GLUCOSE 92 117* 113* 94 98  --   BUN 21* 12 11 10 13   --   CREATININE 0.54 0.41* 0.56 0.53 0.58  --   CALCIUM 8.8* 9.3 9.1 9.0 8.8*  --   MG 2.6* 2.2 2.2 2.3 2.3 2.2   GFR: Estimated Creatinine  Clearance: 86 mL/min (by C-G formula based on SCr of 0.58 mg/dL). Recent Labs  Lab 12/02/18 0610 12/03/18 0330 12/05/18 0713 12/06/18 0245  WBC 11.8* 12.1* 10.8* 12.2*    Liver Function Tests: Recent Labs  Lab 11/30/18 0645 12/01/18 0700 12/02/18 0610 12/03/18 0330  AST 57* 36 37 39  ALT 38 38 38 42  ALKPHOS 126 127* 118 121  BILITOT 0.7 0.4 0.2* 0.3  PROT 6.5 6.6 6.7 6.8  ALBUMIN 2.9* 3.0* 3.0* 3.1*   No results for input(s): LIPASE, AMYLASE in the last 168 hours. No results for input(s): AMMONIA in the last 168 hours.  ABG    Component Value Date/Time   PHART 7.424 11/22/2018 2116   PCO2ART 41.5 11/22/2018 2116   PO2ART 65.0 (L) 11/22/2018 2116   HCO3 27.1 11/22/2018 2116   TCO2 28 11/22/2018 2116   ACIDBASEDEF 1.0 10/24/2018 0419   O2SAT 92.0 11/22/2018 2116     Coagulation Profile: Recent Labs  Lab 12/04/18 0500  INR 1.0    Cardiac Enzymes: No results for input(s): CKTOTAL, CKMB, CKMBINDEX, TROPONINI in the last 168 hours.  HbA1C: No results found for: HGBA1C  CBG: Recent Labs  Lab 12/03/18 1942 12/04/18 0013 12/04/18 0454 12/04/18 0838 12/04/18 1127  GLUCAP 118* 120* 114* 108* 113*     Critical care time: n/a    Heber CarolinaBrent , MD [facility] Pager: [phone] Cell: [phone] If no response, call [phone]

## 2018-12-07 LAB — BASIC METABOLIC PANEL
Anion gap: 8 (ref 5–15)
BUN: 17 mg/dL (ref 6–20)
CO2: 28 mmol/L (ref 22–32)
Calcium: 8.9 mg/dL (ref 8.9–10.3)
Chloride: 102 mmol/L (ref 98–111)
Creatinine, Ser: 0.61 mg/dL (ref 0.44–1.00)
GFR calc Af Amer: >60 mL/min (ref >60)
GFR calc non Af Amer: >60 mL/min (ref >60)
Glucose, Bld: 117 mg/dL — ABNORMAL HIGH (ref 70–99)
Potassium: 3.9 mmol/L (ref 3.5–5.1)
Sodium: 138 mmol/L (ref 135–145)

## 2018-12-07 LAB — CBC WITH DIFFERENTIAL/PLATELET
Abs Immature Granulocytes: 0.05 10*3/uL (ref 0.00–0.07)
Basophils Absolute: 0.1 10*3/uL (ref 0.0–0.1)
Basophils Relative: 1 %
Eosinophils Absolute: 0.9 10*3/uL — ABNORMAL HIGH (ref 0.0–0.5)
Eosinophils Relative: 8 %
HCT: 33.6 % — ABNORMAL LOW (ref 36.0–46.0)
Hemoglobin: 10.2 g/dL — ABNORMAL LOW (ref 12.0–15.0)
Immature Granulocytes: 0 %
Lymphocytes Relative: 33 %
Lymphs Abs: 4 10*3/uL (ref 0.7–4.0)
MCH: 29.4 pg (ref 26.0–34.0)
MCHC: 30.4 g/dL (ref 30.0–36.0)
MCV: 96.8 fL (ref 80.0–100.0)
Monocytes Absolute: 1.5 10*3/uL — ABNORMAL HIGH (ref 0.1–1.0)
Monocytes Relative: 13 %
Neutro Abs: 5.4 10*3/uL (ref 1.7–7.7)
Neutrophils Relative %: 45 %
Platelets: 290 10*3/uL (ref 150–400)
RBC: 3.47 MIL/uL — ABNORMAL LOW (ref 3.87–5.11)
RDW: 16.7 % — ABNORMAL HIGH (ref 11.5–15.5)
WBC: 12.1 10*3/uL — ABNORMAL HIGH (ref 4.0–10.5)
nRBC: 0 % (ref 0.0–0.2)

## 2018-12-07 MED ORDER — ALUM & MAG HYDROXIDE-SIMETH 200-200-20 MG/5ML PO SUSP
30.0000 mL | Freq: Four times a day (QID) | ORAL | Status: DC | PRN
  Administered 2018-12-07: 17:00:00 30 mL via ORAL
  Filled 2018-12-07: qty 30

## 2018-12-07 NOTE — Progress Notes (Signed)
PROGRESS NOTE                                                                                                                                                                                                             Patient Demographics:    Bevely Hackbart, is a 53 y.o. female, DOB - 12/14/1965, UJW:[id number]  Outpatient Primary MD for the patient is No primary care provider on file.    LOS - 46  Chief Complaint  Patient presents with   Weakness       Brief Narrative: Patient is a 53 y.o. female with no significant past medical history-presented with shortness of breath-found to have acute hypoxic respiratory failure secondary to COVID-19 pneumonia-intubated in the emergency room and admitted to [facility]  She had a prolonged hospital course-she was difficult to wean off the ventilator and underwent tracheostomy.  She also had pseudomonal pneumonia and was treated with antimicrobial therapy.  She continues to slowly improve-with decreasing FiO2 requirements, PCCM following for trach care.   Subjective:    Hisayo Delossantos today remains stable-she is asking whether she could go home.   Assessment  & Plan :   Acute Hypoxic Resp Failure due to Covid 19 Viral pneumonia: Improved-remained stable-PCCM following for tracheostomy care.  Tracheostomy capped on 7/4-tolerating well-awaiting PCCM follow-up to see if patient can proceed with decannulation.  COVID-19 Labs:  Recent Labs    12/05/18 0713 12/06/18 0245  DDIMER 0.72* 0.64*  FERRITIN 38 37  CRP 2.5* 1.9*    Lab Results  Component Value Date   SARSCOV2NAA NOT DETECTED 12/03/2018   SARSCOV2NAA POSITIVE (A) 10/22/2018     COVID-19 Medications: Remdesivir>> 5/21 Actemra>> 5/20 and 5/21  Pseudomonas pneumonia: Improved-completed Fortaz-afebrile and not short of breath.  Acute encephalopathy/delirium: Resolved-completely awake and alert.  Continue to  minimize narcotics and sedatives as much as possible.  Brief run of PAF on 6/24: Remains in sinus rhythm-prior MD-Dr. Thedore Mins discussed with Dr. Jory Sims aspirin and metoprolol-recommendations are for outpatient echocardiogram and outpatient cardiology follow-up.  Deconditioning/debility: PT following-recommendations are for CIR.  Patient really wants to go home-we will watch over the weekend and reassess on Monday.  Tongue laceration: Resolved-seems to have healed well-doubt any further follow-up required.  Suspected to be injured during intubation.  Obesity  ABG:    Component Value Date/Time  PHART 7.424 11/22/2018 2116   PCO2ART 41.5 11/22/2018 2116   PO2ART 65.0 (L) 11/22/2018 2116   HCO3 27.1 11/22/2018 2116   TCO2 28 11/22/2018 2116   ACIDBASEDEF 1.0 10/24/2018 0419   O2SAT 92.0 11/22/2018 2116    Condition - Stable  Family Communication  : Spoke with daughter-family able to provide 24/7 care for patient post discharge.  Code Status : Full code  Diet :  Diet Order            DIET DYS 3 Room service appropriate? Yes with Assist; Fluid consistency: Thin  Diet effective now               Disposition Plan  :  Remain inpatient-either home with home health services or CIR on discharge.  Patient prefers home with home health services-we will start talking with case management  Consults  :  PCCM  Procedures  :    ETT>> 5/20-6/11 Tracheostomy>> 6/11 Right arm PICC line>> 6/7  GI prophylaxis: PPI/H2 Blocker  DVT Prophylaxis  :   Heparin  Lab Results  Component Value Date   PLT 290 12/07/2018    Inpatient Medications  Scheduled Meds:  aspirin EC  81 mg Oral Daily   chlorhexidine  15 mL Mouth/Throat BID   Chlorhexidine Gluconate Cloth  6 each Topical Daily   famotidine  20 mg Oral QHS   feeding supplement (ENSURE ENLIVE)  237 mL Oral TID BM   feeding supplement (PRO-STAT SUGAR FREE 64)  30 mL Oral TID WC   heparin injection (subcutaneous)   7,500 Units Subcutaneous Q8H   mouth rinse  15 mL Mouth Rinse QID   metoprolol tartrate  50 mg Oral BID   sodium chloride flush  10-40 mL Intracatheter Q12H   Continuous Infusions: PRN Meds:.acetaminophen, hydrALAZINE, ipratropium-albuterol, metoprolol tartrate, ondansetron (ZOFRAN) IV, oxymetazoline, polyethylene glycol, QUEtiapine, Resource ThickenUp Clear, sennosides  Antibiotics  :    Anti-infectives (From admission, onward)   Start     Dose/Rate Route Frequency Ordered Stop   11/21/18 2200  cefTAZidime (FORTAZ) 2 g in sodium chloride 0.9 % 100 mL IVPB     2 g 200 mL/hr over 30 Minutes Intravenous Every 8 hours 11/21/18 1426 11/28/18 0656   11/21/18 1400  cefTAZidime (FORTAZ) 2 g in sodium chloride 0.9 % 100 mL IVPB  Status:  Discontinued     2 g 200 mL/hr over 30 Minutes Intravenous Every 8 hours 11/21/18 1330 11/21/18 1426   11/20/18 0400  vancomycin (VANCOCIN) IVPB 1000 mg/200 mL premix  Status:  Discontinued     1,000 mg 200 mL/hr over 60 Minutes Intravenous Every 12 hours 11/19/18 1517 11/20/18 1344   11/19/18 1530  vancomycin (VANCOCIN) 1,500 mg in sodium chloride 0.9 % 500 mL IVPB     1,500 mg 250 mL/hr over 120 Minutes Intravenous  Once 11/19/18 1517 11/19/18 1744   11/19/18 1530  piperacillin-tazobactam (ZOSYN) IVPB 3.375 g     3.375 g 12.5 mL/hr over 240 Minutes Intravenous Every 8 hours 11/19/18 1517 11/21/18 1734   11/01/18 1000  fluconazole (DIFLUCAN) IVPB 100 mg     100 mg 50 mL/hr over 60 Minutes Intravenous Every 24 hours 11/01/18 0857 11/03/18 1137   10/27/18 2200  cefTRIAXone (ROCEPHIN) 2 g in sodium chloride 0.9 % 100 mL IVPB     2 g 200 mL/hr over 30 Minutes Intravenous Every 24 hours 10/27/18 0835 10/28/18 2243   10/24/18 1400  remdesivir 100 mg in sodium chloride 0.9 % 250  mL IVPB  Status:  Discontinued     100 mg over 30 Minutes Intravenous Every 24 hours 10/23/18 1153 10/24/18 1335   10/24/18 1400  remdesivir 100 mg in sodium chloride 0.9 % 220 mL  IVPB  Status:  Discontinued     100 mg over 30 Minutes Intravenous Every 24 hours 10/24/18 1334 10/24/18 1336   10/24/18 1400  remdesivir 100 mg in sodium chloride 0.9 % 230 mL IVPB     100 mg over 30 Minutes Intravenous Every 24 hours 10/24/18 1335 11/01/18 1500   10/24/18 1400  remdesivir 100 mg in sodium chloride 0.9 % 230 mL IVPB  Status:  Discontinued     100 mg over 30 Minutes Intravenous Every 24 hours 10/24/18 1337 10/24/18 1339   10/23/18 1400  remdesivir 200 mg in sodium chloride 0.9 % 250 mL IVPB     200 mg over 30 Minutes Intravenous Once 10/23/18 1153 10/23/18 1738   10/22/18 2200  azithromycin (ZITHROMAX) 500 mg in sodium chloride 0.9 % 250 mL IVPB     500 mg 250 mL/hr over 60 Minutes Intravenous Every 24 hours 10/22/18 2148 10/26/18 2255   10/22/18 2200  cefTRIAXone (ROCEPHIN) 1 g in sodium chloride 0.9 % 100 mL IVPB  Status:  Discontinued     1 g 200 mL/hr over 30 Minutes Intravenous Every 24 hours 10/22/18 2148 10/27/18 0835       Time Spent in minutes  25   Jeoffrey MassedShanker Kassidi Elza M.D on 12/07/2018 at 4:01 PM  To page go to [email or web address] - use universal password  [facility] -  Office  [phone]   Admit date - 10/22/2018    46    Objective:   Vitals:   12/07/18 0332 12/07/18 0731 12/07/18 0800 12/07/18 1200  BP: 103/62 109/70 104/61 112/62  Pulse: 83 88 75 87  Resp: (!) 27 (!) 24 (!) 27 (!) 29  Temp: 99.4 F (37.4 C) 99 F (37.2 C)    TempSrc: Oral Oral    SpO2: 90% 99% 99% 100%  Weight:      Height:        Wt Readings from Last 3 Encounters:  12/06/18 89.5 kg    No intake or output data in the 24 hours ending 12/07/18 1601   Physical Exam General appearance:Awake, alert, not in any distress.  Eyes:no scleral icterus. HEENT: Atraumatic and Normocephalic Resp:Good air entry bilaterally,no rales or rhonchi CVS: S1 S2 regular, no murmurs.  GI: Bowel sounds present, Non tender and not distended with no gaurding, rigidity or  rebound. Extremities: B/L Lower Ext shows no edema, both legs are warm to touch Neurology:  Non focal Psychiatric: Normal judgment and insight. Normal mood. Musculoskeletal:No digital cyanosis Skin:No Rash, warm and dry Wounds:N/A   Data Review:    CBC Recent Labs  Lab 12/02/18 0610 12/03/18 0330 12/05/18 0713 12/06/18 0245 12/07/18 0540  WBC 11.8* 12.1* 10.8* 12.2* 12.1*  HGB 11.8* 11.8* 10.0* 9.3* 10.2*  HCT 39.0 39.9 32.9* 30.9* 33.6*  PLT 334 348 276 281 290  MCV 96.1 97.1 95.6 96.3 96.8  MCH 29.1 28.7 29.1 29.0 29.4  MCHC 30.3 29.6* 30.4 30.1 30.4  RDW 17.6* 17.2* 17.2* 17.0* 16.7*  LYMPHSABS 3.2 4.0 3.8 4.6* 4.0  MONOABS 1.4* 1.9* 1.4* 1.6* 1.5*  EOSABS 0.6* 0.7* 0.8* 1.0* 0.9*  BASOSABS 0.1 0.1 0.1 0.1 0.1    Chemistries  Recent Labs  Lab 12/01/18 0700 12/02/18 0610 12/03/18 0330 12/05/18 0713 12/06/18 0245 12/07/18 0540  NA 138 138 136 138  --  138  K 3.8 3.5 3.4* 3.9  --  3.9  CL 97* 97* 97* 101  --  102  CO2 --  28  GLUCOSE 117* 113* 94 98  --  117*  BUN --  17  CREATININE 0.41* 0.56 0.53 0.58  --  0.61  CALCIUM 9.3 9.1 9.0 8.8*  --  8.9  MG 2.2 2.2 2.3 2.3 2.2  --   AST 36 37 39  --   --   --   ALT 38 38 42  --   --   --   ALKPHOS 127* 118 121  --   --   --   BILITOT 0.4 0.2* 0.3  --   --   --    ------------------------------------------------------------------------------------------------------------------ No results for input(s): CHOL, HDL, LDLCALC, TRIG, CHOLHDL, LDLDIRECT in the last 72 hours.  No results found for: HGBA1C ------------------------------------------------------------------------------------------------------------------ No results for input(s): TSH, T4TOTAL, T3FREE, THYROIDAB in the last 72 hours.  Invalid input(s): FREET3 ------------------------------------------------------------------------------------------------------------------ Recent Labs    12/05/18 0713 12/06/18 0245  FERRITIN 38  37    Coagulation profile Recent Labs  Lab 12/04/18 0500  INR 1.0    Recent Labs    12/05/18 0713 12/06/18 0245  DDIMER 0.72* 0.64*    Cardiac Enzymes No results for input(s): CKMB, TROPONINI, MYOGLOBIN in the last 168 hours.  Invalid input(s): CK ------------------------------------------------------------------------------------------------------------------    Component Value Date/Time   BNP 89.5 11/28/2018 0300    Micro Results Recent Results (from the past 240 hour(s))  MRSA PCR Screening     Status: None   Collection Time: 11/28/18  2:00 PM   Specimen: Nasal Mucosa; Nasopharyngeal  Result Value Ref Range Status   MRSA by PCR NEGATIVE NEGATIVE Final    Comment:        The GeneXpert MRSA Assay (FDA approved for NASAL specimens only), is one component of a comprehensive MRSA colonization surveillance program. It is not intended to diagnose MRSA infection nor to guide or monitor treatment for MRSA infections. Performed at [facility], [address], [place], [place] [address]   Novel Coronavirus, NAA (hospital order; send-out to ref lab)     Status: None   Collection Time: 12/03/18  8:07 AM   Specimen: Nasopharyngeal Swab; Respiratory  Result Value Ref Range Status   SARS-CoV-2, NAA NOT DETECTED NOT DETECTED Final    Comment: (NOTE) This test was developed and its performance characteristics determined by [employer]. This test has not been FDA cleared or approved. This test has been authorized by FDA under an Emergency Use Authorization (EUA). This test is only authorized for the duration of time the declaration that circumstances exist justifying the authorization of the emergency use of in vitro diagnostic tests for detection of SARS-CoV-2 virus and/or diagnosis of COVID-19 infection under section 564(b)(1) of the Act, 21 U.S.C. 098JXB-1(Y)(7), unless the authorization is terminated or revoked sooner. When diagnostic  testing is negative, the possibility of a false negative result should be considered in the context of a patient's recent exposures and the presence of clinical signs and symptoms consistent with COVID-19. An individual without symptoms of COVID-19 and who is not shedding SARS-CoV-2 virus would expect to have a negative (not detected) result in this assay. Performed  At: [facility] [address] [place], [place] [id number] Jolene Schimke MD QM:[id number]    Coronavirus Source NASOPHARYNGEAL  Final  Comment: Performed at [facility], [address] [place], [place] [address]    Radiology Reports Dg Abd 1 View  Result Date: 11/10/2018 CLINICAL DATA:  Orogastric placement EXAM: ABDOMEN - 1 VIEW COMPARISON:  None. FINDINGS: Orogastric tube enters the stomach, loops in the body and has its tip in the antrum. Bowel gas pattern appears normal. IMPRESSION: Orogastric tip in the antrum. Electronically Signed   By: Paulina Fusi M.D.   On: 11/10/2018 16:50   Dg Chest Port 1 View  Result Date: 11/25/2018 CLINICAL DATA:  ARDS, COVID-19 positive EXAM: PORTABLE CHEST 1 VIEW COMPARISON:  Portable exam 0508 hours compared to 11/19/2018 FINDINGS: Tracheostomy tube unchanged. Tip of RIGHT arm PICC line projects over cavoatrial junction. Feeding tube extends to at least the second portion of the duodenum. Stable heart size. Diffuse BILATERAL airspace infiltrates, unchanged. No pleural effusion or pneumothorax. IMPRESSION: Persistent diffuse BILATERAL airspace infiltrates Electronically Signed   By: Ulyses Southward M.D.   On: 11/25/2018 08:08   Dg Chest Port 1 View  Result Date: 11/19/2018 CLINICAL DATA:  Fever EXAM: PORTABLE CHEST 1 VIEW COMPARISON:  11/13/2018, 11/11/2018 FINDINGS: Tracheostomy tube in satisfactory position. Feeding tube coiled in the stomach. Right-sided PICC line with the tip projecting over the SVC. Worsening bilateral patchy interstitial and alveolar airspace  disease throughout bilateral lungs concerning for multilobar pneumonia. No pleural effusion or pneumothorax. Stable cardiomediastinal silhouette. No aggressive osseous lesion. IMPRESSION: 1. Support lines and tubing in satisfactory position. 2. Worsening bilateral patchy interstitial and alveolar airspace disease throughout bilateral lungs concerning for multilobar pneumonia. Electronically Signed   By: Elige Ko   On: 11/19/2018 13:38   Dg Chest Port 1 View  Result Date: 11/13/2018 CLINICAL DATA:  Tracheostomy placement.  COVID-19. EXAM: PORTABLE CHEST 1 VIEW COMPARISON:  11/11/2018. FINDINGS: Tracheostomy is been inserted. Tip lies 4.5 cm above carina. BILATERAL pulmonary opacities persist, LEFT greater than RIGHT, not significantly improved. Cardiomegaly. Feeding tube duodenum. IMPRESSION: Satisfactory tracheostomy placement. No significant improvement aeration. Electronically Signed   By: Elsie Stain M.D.   On: 11/13/2018 16:16   Dg Chest Port 1 View  Result Date: 11/11/2018 CLINICAL DATA:  Follow-up aspiration EXAM: PORTABLE CHEST 1 VIEW COMPARISON:  11/10/2018 FINDINGS: Cardiac shadow is stable. Endotracheal tube and gastric catheter are noted in satisfactory position. Right-sided PICC line is noted at the cavoatrial junction. Patchy infiltrates are again seen bilaterally stable from the prior exam. No pneumothorax is noted. IMPRESSION: No change from the previous day. Electronically Signed   By: Alcide Clever M.D.   On: 11/11/2018 07:52   Dg Chest Port 1 View  Result Date: 11/10/2018 CLINICAL DATA:  Intubation.  OG tube placement. EXAM: PORTABLE CHEST 1 VIEW COMPARISON:  November 09, 2018 FINDINGS: The ETT remains in good position, 2 cm above the carina. Bilateral pulmonary infiltrates remain, similar on the left and more prominent on the right in the interval. The OG tube terminates below today's film. No other changes. IMPRESSION: Support apparatus as above. Bilateral pulmonary infiltrates are  stable on the left and mildly more prominent on the right in the interval. Electronically Signed   By: Gerome Sam III M.D   On: 11/10/2018 16:27   Dg Chest Port 1 View  Result Date: 11/09/2018 CLINICAL DATA:  53 year old female positive COVID-19 status post PICC placement. EXAM: PORTABLE CHEST 1 VIEW COMPARISON:  Chest radiograph dated 11/04/2018 FINDINGS: A right-sided PICC is noted with tip over the right subclavian vein. Recommend further advancing by an  additional approximately 10 cm. Endotracheal tube remains above the carina in similar position. Enteric tube extends below the diaphragm likely within the stomach. Interval progression of bilateral confluent densities since the prior radiograph. No large pleural effusion. There is no pneumothorax. No acute osseous pathology. Stable cardiac silhouette. IMPRESSION: 1. Right-sided PICC with tip over the right subclavian vein. Recommend further advancing an additional 10 cm. 2. Interval progression of bilateral confluent densities. Electronically Signed   By: Anner Crete M.D.   On: 11/09/2018 03:25   Dg Abd Portable 1v  Result Date: 11/29/2018 CLINICAL DATA:  Dysphagia EXAM: PORTABLE ABDOMEN - 1 VIEW COMPARISON:  Plain film of the abdomen dated 11/21/2018. FINDINGS: Enteric tube appears adequately positioned in the stomach. Visualized bowel gas pattern is nonobstructive. No evidence of free intraperitoneal air. IMPRESSION: Enteric tube appears adequately positioned in the stomach. Electronically Signed   By: Franki Cabot M.D.   On: 11/29/2018 11:29   Dg Abd Portable 1v  Result Date: 11/21/2018 CLINICAL DATA:  NG tube placement. EXAM: PORTABLE ABDOMEN - 1 VIEW COMPARISON:  Plain films of the abdomen dated 11/20/2018 and 11/19/2018. FINDINGS: Weighted tip feeding tube now appears appropriately positioned with tip in the proximal duodenum. Visualized bowel gas pattern is nonobstructive. IMPRESSION: Weighted tip feeding tube now appears  appropriately positioned with tip in the proximal duodenum. Electronically Signed   By: Franki Cabot M.D.   On: 11/21/2018 12:24   Dg Abd Portable 1v  Result Date: 11/20/2018 CLINICAL DATA:  Encounter for NG tube placement EXAM: PORTABLE ABDOMEN - 1 VIEW COMPARISON:  11/19/2018 FINDINGS: Feeding tube coils in the stomach with the tip in the fundus. Nonobstructive bowel gas pattern. No organomegaly or free air. IMPRESSION: Feeding tube coils in the stomach with the tip in the fundus. Electronically Signed   By: Rolm Baptise M.D.   On: 11/20/2018 19:57   Dg Abd Portable 1v  Result Date: 11/19/2018 CLINICAL DATA:  Vomiting. EXAM: PORTABLE ABDOMEN - 1 VIEW COMPARISON:  None. FINDINGS: A feeding tube coils in the stomach with the distal tip in the fundus. No evidence of bowel obstruction. IMPRESSION: A feeding tube coils in the stomach. No evidence of bowel obstruction. No cause for vomiting noted. Electronically Signed   By: Dorise Bullion III M.D   On: 11/19/2018 16:43   Dg Swallowing Func-speech Pathology  Result Date: 12/01/2018 Objective Swallowing Evaluation: Type of Study: MBS-Modified Barium Swallow Study  Patient Details Name: Anasia Agro MRN: [id number] Date of Birth: 06-Jan-1966 Today's Date: 12/01/2018 Time: SLP Start Time (ACUTE ONLY): 1050 -SLP Stop Time (ACUTE ONLY): 1122 SLP Time Calculation (min) (ACUTE ONLY): 32 min Past Medical History: No past medical history on file. Past Surgical History: No past surgical history on file. HPI: Pt adm 5/20 with hypoxic resp failure due to Covid 19. Pt intubated 5/20 and extubated 6/8, with re-intubation 6/8; Trach 6/11. PMH - none.  Subjective: alert, distractible Assessment / Plan / Recommendation CHL IP CLINICAL IMPRESSIONS 12/01/2018 Clinical Impression Pt demonstrates moderate oropharyngeal dysphagia following prolonged critical illness and intubation. 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 PMSV in place throught, pt on 2 liters [place]. Pt has relatively good oral function despite lingual wound that is healing well. LIngual manipulation for mastication, bolus formation and propulsion appears a little slow but adequate. However, premature spillage and delayed swallow initiation do lead to prolonged pooling of liquids in pyriform sinsues prior to swallow initiation. WIth thin liquids penetration and  aspiration via the postirior commisure is failry consistent, no response from pt but a cued cough ejects. Chin tuck not beneficial. Nectar thick liquids and solids were not aspirated but there was mild to moderate diffuse pharyngeal residual suggesting mild generalized muscle weakness. Pt able to follow cue to swallow twice. Recommend a dys 3 mech soft) diet with nectar thick liquids and intermittent instruction to swallow again. Will follow for tolerance.  SLP Visit Diagnosis Dysphagia, oropharyngeal phase (R13.12) Attention and concentration deficit following -- Frontal lobe and executive function deficit following -- Impact on safety and function Moderate aspiration risk   CHL IP TREATMENT RECOMMENDATION 12/01/2018 Treatment Recommendations Therapy as outlined in treatment plan below   Prognosis 12/01/2018 Prognosis for Safe Diet Advancement Good Barriers to Reach Goals Cognitive deficits Barriers/Prognosis Comment -- CHL IP DIET RECOMMENDATION 12/01/2018 SLP Diet Recommendations Dysphagia 3 (Mech soft) solids;Nectar thick liquid Liquid Administration via Cup;Straw Medication Administration Whole meds with liquid Compensations Slow rate;Small sips/bites;Follow solids with liquid;Multiple dry swallows after each bite/sip Postural Changes Seated upright at 90 degrees   CHL IP OTHER RECOMMENDATIONS 12/01/2018 Recommended Consults -- Oral Care Recommendations Oral care BID Other Recommendations Order thickener from pharmacy   CHL IP FOLLOW UP RECOMMENDATIONS 12/01/2018 Follow up Recommendations Inpatient  Rehab   CHL IP FREQUENCY AND DURATION 12/01/2018 Speech Therapy Frequency (ACUTE ONLY) min 2x/week Treatment Duration 2 weeks      CHL IP ORAL PHASE 12/01/2018 Oral Phase WFL Oral - Pudding Teaspoon -- Oral - Pudding Cup -- Oral - Honey Teaspoon -- Oral - Honey Cup -- Oral - Nectar Teaspoon -- Oral - Nectar Cup -- Oral - Nectar Straw -- Oral - Thin Teaspoon -- Oral - Thin Cup -- Oral - Thin Straw -- Oral - Puree -- Oral - Mech Soft -- Oral - Regular -- Oral - Multi-Consistency -- Oral - Pill -- Oral Phase - Comment --  CHL IP PHARYNGEAL PHASE 12/01/2018 Pharyngeal Phase Impaired Pharyngeal- Pudding Teaspoon -- Pharyngeal -- Pharyngeal- Pudding Cup -- Pharyngeal -- Pharyngeal- Honey Teaspoon -- Pharyngeal -- Pharyngeal- Honey Cup -- Pharyngeal -- Pharyngeal- Nectar Teaspoon -- Pharyngeal -- Pharyngeal- Nectar Cup Delayed swallow initiation-pyriform sinuses;Reduced tongue base retraction;Reduced laryngeal elevation;Reduced anterior laryngeal mobility;Reduced epiglottic inversion;Reduced pharyngeal peristalsis;Pharyngeal residue - valleculae;Pharyngeal residue - pyriform Pharyngeal -- Pharyngeal- Nectar Straw Delayed swallow initiation-pyriform sinuses;Reduced tongue base retraction;Reduced laryngeal elevation;Reduced anterior laryngeal mobility;Reduced epiglottic inversion;Reduced pharyngeal peristalsis;Pharyngeal residue - valleculae;Pharyngeal residue - pyriform Pharyngeal -- Pharyngeal- Thin Teaspoon -- Pharyngeal -- Pharyngeal- Thin Cup Delayed swallow initiation-pyriform sinuses;Reduced airway/laryngeal closure;Penetration/Aspiration during swallow;Trace aspiration;Pharyngeal residue - pyriform Pharyngeal Material enters airway, CONTACTS cords and not ejected out;Material enters airway, passes BELOW cords without attempt by patient to eject out (silent aspiration) Pharyngeal- Thin Straw Delayed swallow initiation-pyriform sinuses;Reduced airway/laryngeal closure;Penetration/Aspiration during swallow;Trace  aspiration;Pharyngeal residue - pyriform;Compensatory strategies attempted (with notebox) Pharyngeal Material enters airway, passes BELOW cords without attempt by patient to eject out (silent aspiration);Material enters airway, CONTACTS cords and not ejected out Pharyngeal- Puree Delayed swallow initiation-pyriform sinuses;Reduced tongue base retraction;Reduced laryngeal elevation;Reduced anterior laryngeal mobility;Reduced epiglottic inversion;Reduced pharyngeal peristalsis;Pharyngeal residue - valleculae;Pharyngeal residue - pyriform Pharyngeal -- Pharyngeal- Mechanical Soft -- Pharyngeal -- Pharyngeal- Regular Delayed swallow initiation-pyriform sinuses;Reduced tongue base retraction;Reduced laryngeal elevation;Reduced anterior laryngeal mobility;Reduced epiglottic inversion;Reduced pharyngeal peristalsis;Pharyngeal residue - valleculae;Pharyngeal residue - pyriform Pharyngeal -- Pharyngeal- Multi-consistency -- Pharyngeal -- Pharyngeal- Pill -- Pharyngeal -- Pharyngeal Comment --  No flowsheet data found. DeBlois, Riley NearingBonnie Caroline 12/01/2018, 12:02 PM  [place] Ekg Site Rite  Result Date: 11/09/2018 If Site Rite image not attached, placement could not be confirmed due to current cardiac rhythm.

## 2018-12-07 NOTE — Progress Notes (Signed)
NAME:  Shannon BradfordDeborah Troublefield, MRN:  [id number], DOB:  06/26/1965, LOS: 47 ADMISSION DATE:  10/22/2018, CONSULTATION DATE:  10/22/2018 REFERRING MD:  Pilar PlateBero, CHIEF COMPLAINT:  Dyspnea   Brief History   53 year old female with no past medical history admitted [facility] Oct [facility], 2018 due to ARDS from COVID-19 pneumonia.  Has had a prolonged hospitalization, required tracheostomy.  Liberated from the ventilator on 6/15.   Past Medical History  none  Significant Hospital Events   5/20 Intubated in ED and [facility] ICU 5/21-5/26 Weaning FIO2 and PEEP, diuresing 5/26-6/3 Tolerating weaning for 1-2 hours 6/7 weaning on pressure support ventilation again this morning 10/5 6/8 extubated, reintubated (tachypnea, tachycardia) 6/12 Trached 6/13 Trach collar all day  6/14 bleeding from tracheostomy site 6/17 48 hours off vent, fever > pseudomonas pneumonia 6/20 slightly more awake, remains off vent 6/26 transferred to med-surg floor 7/1 tracheostomy downsized to #4 cuffless, started on continuous speaking valve trials 7/4 capped tracheostomy - Has remained on speaking valve trials  Consults:  PCCM  Procedures:  May 20 endotracheal tube> June 11 Tracheostomy June 11>  June 7 PICC line right arm>   Significant Diagnostic Tests:  Oct 28, 2018 CT angiogram chest no pulmonary embolism, bilateral atypical pneumonia consistent with COVID-19 CXR 6/23 - improving infiltrates  Micro Data:  May 20 SARS-CoV-2 positive May 20 blood culture> 1 out of 2 GPC coag negative staph June 17 respiratory culture> pseudomonas aeruginosa  Antimicrobials:  Zithromax 5/20 >>5/24 Rocephin 5/20 >>5/26 Remdesivir 5/21 >5/30 Actemra 5/20>5/21 Solumedrol 5/12 >5/26  Vanc 6/17 > x1 Zosyn 6/17 > 6/19 Ceftaz 6/17 > 6/25  Interim history/subjective:    12/08/2018 - doing well. On 1L [place]. Talking and eating. On PMV.   Objective   Blood pressure 107/65, pulse 93,  temperature 98.8 F (37.1 C), temperature source Axillary, resp. rate (!) 23, height 5\' 4"  (1.626 m), weight 89.5 kg, SpO2 93 %.        Intake/Output Summary (Last 24 hours) at 12/08/2018 1131 Last data filed at 12/08/2018 0808 Gross per 24 hour  Intake -  Output 150 ml  Net -150 ml   Filed Weights   12/04/18 0500 12/05/18 0500 12/06/18 0514  Weight: 85.2 kg 89.3 kg 89.5 kg    Examination: General Appearance:  Obese. In bed Head:  Normocephalic, without obvious abnormality, atraumatic Eyes:  PERRL - yes, conjunctiva/corneas - clear     Ears:  Normal external ear canals, both ears Nose:  G tube - no. On [place] 1L Throat:  ETT TUBE - no , OG tube - no. TRACH WITH PMV + Neck:  Supple,  No enlargement/tenderness/nodules Lungs: Clear to auscultation bilaterally,  Heart:  S1 and S2 normal, no murmur, CVP - no.  Pressors - no Abdomen:  Soft, no masses, no organomegaly Genitalia / Rectal:  Not done Extremities:  Extremities- intact Skin:  ntact in exposed areas . Sacral area - not examined Neurologic:  Sedation - none -> RASS - +1 . Moves all 4s - yes. CAM-ICU - neg . Orientation - x3+      LABS    PULMONARY No results for input(s): PHART, PCO2ART, PO2ART, HCO3, TCO2, O2SAT in the last 168 hours.  Invalid input(s): PCO2, PO2  CBC Recent Labs  Lab 12/06/18 0245 12/07/18 0540 12/08/18 0845  HGB 9.3* 10.2* 9.8*  HCT 30.9* 33.6* 33.4*  WBC 12.2* 12.1* 11.4*  PLT 281 290 277    COAGULATION Recent Labs  Lab 12/04/18 0500  INR 1.0    CARDIAC  No results for input(s): TROPONINI in the last 168 hours. No results for input(s): PROBNP in the last 168 hours.   CHEMISTRY Recent Labs  Lab 12/02/18 0610 12/03/18 0330 12/05/18 0713 12/06/18 0245 12/07/18 0540  NA 138 136 138  --  138  K 3.5 3.4* 3.9  --  3.9  CL 97* 97* 101  --  102  CO2 29 30 28   --  28  GLUCOSE 113* 94 98  --  117*  BUN 11 10 13   --  17  CREATININE 0.56 0.53 0.58  --  0.61  CALCIUM 9.1 9.0 8.8*   --  8.9  MG 2.2 2.3 2.3 2.2  --    Estimated Creatinine Clearance: 86 mL/min (by C-G formula based on SCr of 0.61 mg/dL).   LIVER Recent Labs  Lab 12/02/18 0610 12/03/18 0330 12/04/18 0500  AST 37 39  --   ALT 38 42  --   ALKPHOS 118 121  --   BILITOT 0.2* 0.3  --   PROT 6.7 6.8  --   ALBUMIN 3.0* 3.1*  --   INR  --   --  1.0     INFECTIOUS No results for input(s): LATICACIDVEN, PROCALCITON in the last 168 hours.   ENDOCRINE CBG (last 3)  Recent Labs    12/08/18 0801  GLUCAP 106*         IMAGING x48h  - image(s) personally visualized  -   highlighted in bold No results found.     Resolved Hospital Problem list   Pseudomonas pneumonia:  Assessment & Plan:  ARDS due to COVID-19 pneumonia: Resolving Continue to wean off oxygen  Tracheostomy status: could likely decannulate Doing well with PMV capped  Plan  - decannulate trach    Best practice:  Diet: advance Pain/Anxiety/Delirium protocol (if indicated): n/a VAP protocol (if indicated): n/a DVT prophylaxis: sub q hep GI prophylaxis: n/a Glucose control: per [facility] Mobility: out of bed, PT consult Code Status: full Family Communication:  Per [facility] Disposition: consider [facility]     ATTESTATION & SIGNATURE   Dr. Brand Males, M.D., F.C.C.P Pulmonary and Critical Care Medicine Staff Physician [facility] Pager: [phone], If no answer or between  15:00h - 7:00h: call [phone]  12/08/2018 11:31 AM

## 2018-12-08 DIAGNOSIS — Z430 Encounter for attention to tracheostomy: Secondary | ICD-10-CM

## 2018-12-08 LAB — CBC WITH DIFFERENTIAL/PLATELET
Abs Immature Granulocytes: 0.03 10*3/uL (ref 0.00–0.07)
Basophils Absolute: 0.1 10*3/uL (ref 0.0–0.1)
Basophils Relative: 1 %
Eosinophils Absolute: 0.7 10*3/uL — ABNORMAL HIGH (ref 0.0–0.5)
Eosinophils Relative: 7 %
HCT: 33.4 % — ABNORMAL LOW (ref 36.0–46.0)
Hemoglobin: 9.8 g/dL — ABNORMAL LOW (ref 12.0–15.0)
Immature Granulocytes: 0 %
Lymphocytes Relative: 41 %
Lymphs Abs: 4.8 10*3/uL — ABNORMAL HIGH (ref 0.7–4.0)
MCH: 28.5 pg (ref 26.0–34.0)
MCHC: 29.3 g/dL — ABNORMAL LOW (ref 30.0–36.0)
MCV: 97.1 fL (ref 80.0–100.0)
Monocytes Absolute: 1.3 10*3/uL — ABNORMAL HIGH (ref 0.1–1.0)
Monocytes Relative: 12 %
Neutro Abs: 4.5 10*3/uL (ref 1.7–7.7)
Neutrophils Relative %: 39 %
Platelets: 277 10*3/uL (ref 150–400)
RBC: 3.44 MIL/uL — ABNORMAL LOW (ref 3.87–5.11)
RDW: 16.7 % — ABNORMAL HIGH (ref 11.5–15.5)
WBC: 11.4 10*3/uL — ABNORMAL HIGH (ref 4.0–10.5)
nRBC: 0 % (ref 0.0–0.2)

## 2018-12-08 LAB — GLUCOSE, CAPILLARY: Glucose-Capillary: 106 mg/dL — ABNORMAL HIGH (ref 70–99)

## 2018-12-08 MED ORDER — MELATONIN 3 MG PO TABS
3.0000 mg | ORAL_TABLET | Freq: Every evening | ORAL | Status: DC | PRN
  Administered 2018-12-08: 22:00:00 3 mg via ORAL
  Filled 2018-12-08 (×2): qty 1

## 2018-12-08 MED ORDER — LIP MEDEX EX OINT
TOPICAL_OINTMENT | CUTANEOUS | Status: DC | PRN
  Filled 2018-12-08: qty 7

## 2018-12-08 MED ORDER — ADULT MULTIVITAMIN W/MINERALS CH
1.0000 | ORAL_TABLET | Freq: Every day | ORAL | Status: DC
  Administered 2018-12-08 – 2018-12-09 (×2): 1 via ORAL
  Filled 2018-12-08: qty 1

## 2018-12-08 MED ORDER — DIPHENHYDRAMINE HCL 25 MG PO CAPS
25.0000 mg | ORAL_CAPSULE | Freq: Every evening | ORAL | Status: DC | PRN
  Administered 2018-12-08: 22:00:00 25 mg via ORAL
  Filled 2018-12-08: qty 1

## 2018-12-08 NOTE — Progress Notes (Signed)
 Nutrition Follow-up  DOCUMENTATION CODES:   Obesity unspecified  INTERVENTION:   Recommend that pt continue oral nutrition supplements at discharge  Continue Ensure Enlive po TID, each supplement provides 350 kcal and 20 grams of protein  Continue 30 ml Prostat TID, each supplement provides 100 kcals and 15 grams protein.   Add MVI with minerals   NUTRITION DIAGNOSIS:   Inadequate oral intake related to inability to eat as evidenced by NPO status.  Progressing   GOAL:   Patient will meet greater than or equal to 90% of their needs  Progressing   MONITOR:   PO intake, Supplement acceptance, Labs, Weight trends, Skin  REASON FOR ASSESSMENT:   Ventilator, Consult Enteral/tube feeding initiation and management  ASSESSMENT:   53 yo female with no significant PMH who was admitted with COVID-19.  5/20 Admitted, Intubated 6/08 Extubated, Re-Intubated 6/12 Trach placed 6/26 Transferred out of ICU 6/28 Pt pulled Cortrak tube out, unable to replace NG despite multiple attempts 6/29 MBS performed, diet advanced to Dysphagia III, Nectar Thick 7/02 Diet liberalized to Dysphagia III, Thins 7/06 Decanulated  Pt decanulated today; plan for d/c to CIR vs Home with [place] working with PT but very deconditioned  Tongue laceration healed nicely per MD notes  Limited documentation of po intake although intake appears to be improving; recorded po intake 5-50%.  Pt taking Ensure Enlive and Pro-Stat supplements  Labs:reviewed Meds: reviewed  Diet Order:   Diet Order            DIET DYS 3 Room service appropriate? Yes with Assist; Fluid consistency: Thin  Diet effective now              EDUCATION NEEDS:   Not appropriate for education at this time  Skin:  Skin Assessment: Skin Integrity Issues: Skin Integrity Issues:: Other (Comment) Stage II: n/a Other: MASD to buttock, tongue laceration healed  Last BM:  7/5  Height:   Ht Readings from Last 1  Encounters:  10/22/18 5\' 4"  (1.626 m)    Weight:   Wt Readings from Last 1 Encounters:  12/06/18 89.5 kg    Ideal Body Weight:  54.5 kg  BMI:  Body mass index is 33.87 kg/m.  Estimated Nutritional Needs:   Kcal:  1850-2050 kcals  Protein:  95-105 g  Fluid:  >/= 1.8 L     Ronit Cranfield MS, RDN, LDN, CNSC [phone] Pager  [phone] Weekend/On-Call Pager

## 2018-12-08 NOTE — Progress Notes (Addendum)
Inpatient Rehab Admissions:  Inpatient Rehab Consult received.  Per chart review, pt wanting to d/c home with home health and family support and able to perform bed mobility and sit<> stand transfers with supervision/min guard, but requires the stedy for bed<>chair transfers as of 7/3.  Will await therapy notes from 7/6 to determine whether CIR is appropriate.   Shann Medal, PT, DPT Admissions Coordinator [phone] 12/08/18  9:55 AM

## 2018-12-08 NOTE — Progress Notes (Signed)
PROGRESS NOTE                                                                                                                                                                                                             Patient Demographics:    Shannon BradfordDeborah Meyer, is a 53 y.o. female, DOB - 04/10/1966, ZOX:[id number]  Outpatient Primary MD for the patient is No primary care provider on file.    LOS - 47  Chief Complaint  Patient presents with   Weakness       Brief Narrative: Patient is a 53 y.o. female with no significant past medical history-presented with shortness of breath-found to have acute hypoxic respiratory failure secondary to COVID-19 pneumonia-intubated in the emergency room and [facility].  She had a prolonged hospital course-she was difficult to wean off the ventilator and underwent tracheostomy.  She also had pseudomonal pneumonia and was treated with antimicrobial therapy.  She continues to slowly improve-with decreasing FiO2 requirements, PCCM following for trach care.   Subjective:    Shannon BradfordDeborah Meyer today remains stable-denies any chest pain or shortness of breath.  Seen by pulmonology earlier-with plans for decannulation today.  She does not want to go to CIR-and wants to go home with home health services-I have spoken with case management this morning.   Assessment  & Plan :   Acute Hypoxic Resp Failure due to Covid 19 Viral pneumonia: Improved-stable over the past few days-PCCM planning decannulation of tracheostomy today.   COVID-19 Labs:  Recent Labs    12/06/18 0245  DDIMER 0.64*  FERRITIN 37  CRP 1.9*    Lab Results  Component Value Date   SARSCOV2NAA NOT DETECTED 12/03/2018   SARSCOV2NAA POSITIVE (A) 10/22/2018     COVID-19 Medications: Remdesivir>> 5/21 Actemra>> 5/20 and 5/21  Pseudomonas pneumonia: Improved-completed Fortaz-afebrile and not short of  breath.  Acute encephalopathy/delirium: Resolved-completely awake and alert.  Continue to minimize narcotics and sedatives as much as possible.  Brief run of PAF on 6/24: Remains in sinus rhythm-prior MD-Dr. Thedore MinsSingh discussed with Dr. Jory SimsSkains-continue aspirin and metoprolol-recommendations are for outpatient echocardiogram and outpatient cardiology follow-up.  Deconditioning/debility: PT following-recommendations are for CIR.  Patient really wants to go home-we will watch over the weekend and reassess on Monday.  Tongue laceration: Resolved-seems to have healed well-doubt any further follow-up required.  Suspected to be injured during intubation.  Obesity  ABG:    Component Value Date/Time   PHART 7.424 11/22/2018 2116   PCO2ART 41.5 11/22/2018 2116   PO2ART 65.0 (L) 11/22/2018 2116   HCO3 27.1 11/22/2018 2116   TCO2 28 11/22/2018 2116   ACIDBASEDEF 1.0 10/24/2018 0419   O2SAT 92.0 11/22/2018 2116    Condition - Stable  Family Communication  : Left message /voicemail for patient's daughter.  Code Status : Full code  Diet :  Diet Order            DIET DYS 3 Room service appropriate? Yes with Assist; Fluid consistency: Thin  Diet effective now               Disposition Plan  :  Remain inpatient-refuses CIR-and wants to go home with home health services.  Tentative discharge on 7/7  Consults  :  PCCM  Procedures  :    ETT>> 5/20-6/11 Tracheostomy>> 6/11 Right arm PICC line>> 6/7  GI prophylaxis: PPI/H2 Blocker  DVT Prophylaxis  :   Heparin  Lab Results  Component Value Date   PLT 277 12/08/2018    Inpatient Medications  Scheduled Meds:  aspirin EC  81 mg Oral Daily   chlorhexidine  15 mL Mouth/Throat BID   Chlorhexidine Gluconate Cloth  6 each Topical Daily   famotidine  20 mg Oral QHS   feeding supplement (ENSURE ENLIVE)  237 mL Oral TID BM   feeding supplement (PRO-STAT SUGAR FREE 64)  30 mL Oral TID WC   heparin injection (subcutaneous)  7,500  Units Subcutaneous Q8H   mouth rinse  15 mL Mouth Rinse QID   metoprolol tartrate  50 mg Oral BID   sodium chloride flush  10-40 mL Intracatheter Q12H   Continuous Infusions: PRN Meds:.acetaminophen, alum & mag hydroxide-simeth, hydrALAZINE, ipratropium-albuterol, metoprolol tartrate, ondansetron (ZOFRAN) IV, oxymetazoline, polyethylene glycol, QUEtiapine, Resource ThickenUp Clear, sennosides  Antibiotics  :    Anti-infectives (From admission, onward)   Start     Dose/Rate Route Frequency Ordered Stop   11/21/18 2200  cefTAZidime (FORTAZ) 2 g in sodium chloride 0.9 % 100 mL IVPB     2 g 200 mL/hr over 30 Minutes Intravenous Every 8 hours 11/21/18 1426 11/28/18 0656   11/21/18 1400  cefTAZidime (FORTAZ) 2 g in sodium chloride 0.9 % 100 mL IVPB  Status:  Discontinued     2 g 200 mL/hr over 30 Minutes Intravenous Every 8 hours 11/21/18 1330 11/21/18 1426   11/20/18 0400  vancomycin (VANCOCIN) IVPB 1000 mg/200 mL premix  Status:  Discontinued     1,000 mg 200 mL/hr over 60 Minutes Intravenous Every 12 hours 11/19/18 1517 11/20/18 1344   11/19/18 1530  vancomycin (VANCOCIN) 1,500 mg in sodium chloride 0.9 % 500 mL IVPB     1,500 mg 250 mL/hr over 120 Minutes Intravenous  Once 11/19/18 1517 11/19/18 1744   11/19/18 1530  piperacillin-tazobactam (ZOSYN) IVPB 3.375 g     3.375 g 12.5 mL/hr over 240 Minutes Intravenous Every 8 hours 11/19/18 1517 11/21/18 1734   11/01/18 1000  fluconazole (DIFLUCAN) IVPB 100 mg     100 mg 50 mL/hr over 60 Minutes Intravenous Every 24 hours 11/01/18 0857 11/03/18 1137   10/27/18 2200  cefTRIAXone (ROCEPHIN) 2 g in sodium chloride 0.9 % 100 mL IVPB     2 g 200 mL/hr over 30 Minutes Intravenous Every 24 hours 10/27/18 0835 10/28/18 2243   10/24/18 1400  remdesivir 100  mg in sodium chloride 0.9 % 250 mL IVPB  Status:  Discontinued     100 mg over 30 Minutes Intravenous Every 24 hours 10/23/18 1153 10/24/18 1335   10/24/18 1400  remdesivir 100 mg in sodium  chloride 0.9 % 220 mL IVPB  Status:  Discontinued     100 mg over 30 Minutes Intravenous Every 24 hours 10/24/18 1334 10/24/18 1336   10/24/18 1400  remdesivir 100 mg in sodium chloride 0.9 % 230 mL IVPB     100 mg over 30 Minutes Intravenous Every 24 hours 10/24/18 1335 11/01/18 1500   10/24/18 1400  remdesivir 100 mg in sodium chloride 0.9 % 230 mL IVPB  Status:  Discontinued     100 mg over 30 Minutes Intravenous Every 24 hours 10/24/18 1337 10/24/18 1339   10/23/18 1400  remdesivir 200 mg in sodium chloride 0.9 % 250 mL IVPB     200 mg over 30 Minutes Intravenous Once 10/23/18 1153 10/23/18 1738   10/22/18 2200  azithromycin (ZITHROMAX) 500 mg in sodium chloride 0.9 % 250 mL IVPB     500 mg 250 mL/hr over 60 Minutes Intravenous Every 24 hours 10/22/18 2148 10/26/18 2255   10/22/18 2200  cefTRIAXone (ROCEPHIN) 1 g in sodium chloride 0.9 % 100 mL IVPB  Status:  Discontinued     1 g 200 mL/hr over 30 Minutes Intravenous Every 24 hours 10/22/18 2148 10/27/18 0835       Time Spent in minutes  25   Jeoffrey Massed M.D on 12/08/2018 at 10:31 AM  To page go to [email or web address] - use universal password  [facility] -  Office  [phone]   Admit date - 10/22/2018    47    Objective:   Vitals:   12/07/18 2000 12/07/18 2300 12/08/18 0353 12/08/18 0803  BP: 90/80  107/65   Pulse: 96 87 93   Resp: (!) 31 (!) 21 (!) 23   Temp: 98 F (36.7 C)  98.2 F (36.8 C) 98.8 F (37.1 C)  TempSrc: Oral  Oral Axillary  SpO2: 98% 100% 93%   Weight:      Height:        Wt Readings from Last 3 Encounters:  12/06/18 89.5 kg     Intake/Output Summary (Last 24 hours) at 12/08/2018 1031 Last data filed at 12/08/2018 0808 Gross per 24 hour  Intake --  Output 150 ml  Net -150 ml     Physical Exam General appearance:Awake, alert, not in any distress.  Eyes:no scleral icterus. HEENT: Atraumatic and Normocephalic Neck: supple, no JVD. Resp:Good air entry bilaterally,no rales or  rhonchi CVS: S1 S2 regular, no murmurs.  GI: Bowel sounds present, Non tender and not distended with no gaurding, rigidity or rebound. Extremities: B/L Lower Ext shows no edema, both legs are warm to touch Neurology:  Non focal Psychiatric: Normal judgment and insight. Normal mood. Musculoskeletal:No digital cyanosis Skin:No Rash, warm and dry Wounds:N/A   Data Review:    CBC Recent Labs  Lab 12/03/18 0330 12/05/18 0713 12/06/18 0245 12/07/18 0540 12/08/18 0845  WBC 12.1* 10.8* 12.2* 12.1* 11.4*  HGB 11.8* 10.0* 9.3* 10.2* 9.8*  HCT 39.9 32.9* 30.9* 33.6* 33.4*  PLT 348 276 281 290 277  MCV 97.1 95.6 96.3 96.8 97.1  MCH 28.7 29.1 29.0 29.4 28.5  MCHC 29.6* 30.4 30.1 30.4 29.3*  RDW 17.2* 17.2* 17.0* 16.7* 16.7*  LYMPHSABS 4.0 3.8 4.6* 4.0 4.8*  MONOABS 1.9* 1.4* 1.6* 1.5* 1.3*  EOSABS  0.7* 0.8* 1.0* 0.9* 0.7*  BASOSABS 0.1 0.1 0.1 0.1 0.1    Chemistries  Recent Labs  Lab 12/02/18 0610 12/03/18 0330 12/05/18 0713 12/06/18 0245 12/07/18 0540  NA 138 136 138  --  138  K 3.5 3.4* 3.9  --  3.9  CL 97* 97* 101  --  102  CO2 --  28  GLUCOSE 113* 94 98  --  117*  BUN --  17  CREATININE 0.56 0.53 0.58  --  0.61  CALCIUM 9.1 9.0 8.8*  --  8.9  MG 2.2 2.3 2.3 2.2  --   AST 37 39  --   --   --   ALT 38 42  --   --   --   ALKPHOS 118 121  --   --   --   BILITOT 0.2* 0.3  --   --   --    ------------------------------------------------------------------------------------------------------------------ No results for input(s): CHOL, HDL, LDLCALC, TRIG, CHOLHDL, LDLDIRECT in the last 72 hours.  No results found for: HGBA1C ------------------------------------------------------------------------------------------------------------------ No results for input(s): TSH, T4TOTAL, T3FREE, THYROIDAB in the last 72 hours.  Invalid input(s):  FREET3 ------------------------------------------------------------------------------------------------------------------ Recent Labs    12/06/18 0245  FERRITIN 37    Coagulation profile Recent Labs  Lab 12/04/18 0500  INR 1.0    Recent Labs    12/06/18 0245  DDIMER 0.64*    Cardiac Enzymes No results for input(s): CKMB, TROPONINI, MYOGLOBIN in the last 168 hours.  Invalid input(s): CK ------------------------------------------------------------------------------------------------------------------    Component Value Date/Time   BNP 89.5 11/28/2018 0300    Micro Results Recent Results (from the past 240 hour(s))  MRSA PCR Screening     Status: None   Collection Time: 11/28/18  2:00 PM   Specimen: Nasal Mucosa; Nasopharyngeal  Result Value Ref Range Status   MRSA by PCR NEGATIVE NEGATIVE Final    Comment:        The GeneXpert MRSA Assay (FDA approved for NASAL specimens only), is one component of a comprehensive MRSA colonization surveillance program. It is not intended to diagnose MRSA infection nor to guide or monitor treatment for MRSA infections. Performed at [facility], [address] [place], [place] [address]   Novel Coronavirus, NAA (hospital order; send-out to ref lab)     Status: None   Collection Time: 12/03/18  8:07 AM   Specimen: Nasopharyngeal Swab; Respiratory  Result Value Ref Range Status   SARS-CoV-2, NAA NOT DETECTED NOT DETECTED Final    Comment: (NOTE) This test was developed and its performance characteristics determined by [employer]. This test has not been FDA cleared or approved. This test has been authorized by FDA under an Emergency Use Authorization (EUA). This test is only authorized for the duration of time the declaration that circumstances exist justifying the authorization of the emergency use of in vitro diagnostic tests for detection of SARS-CoV-2 virus and/or diagnosis of COVID-19  infection under section 564(b)(1) of the Act, 21 U.S.C. 191YNW-2(N)(5), unless the authorization is terminated or revoked sooner. When diagnostic testing is negative, the possibility of a false negative result should be considered in the context of a patient's recent exposures and the presence of clinical signs and symptoms consistent with COVID-19. An individual without symptoms of COVID-19 and who is not shedding SARS-CoV-2 virus would expect to have a negative (not detected) result in this assay. Performed  At: [facility] [address] [place], [place]  [id number] Jolene Schimke MD QI:[id number]    Coronavirus Source NASOPHARYNGEAL  Final    Comment: Performed at [facility], [address], [place], [place] [address]    Radiology Reports Dg Abd 1 View  Result Date: 11/10/2018 CLINICAL DATA:  Orogastric placement EXAM: ABDOMEN - 1 VIEW COMPARISON:  None. FINDINGS: Orogastric tube enters the stomach, loops in the body and has its tip in the antrum. Bowel gas pattern appears normal. IMPRESSION: Orogastric tip in the antrum. Electronically Signed   By: Paulina Fusi M.D.   On: 11/10/2018 16:50   Dg Chest Port 1 View  Result Date: 11/25/2018 CLINICAL DATA:  ARDS, COVID-19 positive EXAM: PORTABLE CHEST 1 VIEW COMPARISON:  Portable exam 0508 hours compared to 11/19/2018 FINDINGS: Tracheostomy tube unchanged. Tip of RIGHT arm PICC line projects over cavoatrial junction. Feeding tube extends to at least the second portion of the duodenum. Stable heart size. Diffuse BILATERAL airspace infiltrates, unchanged. No pleural effusion or pneumothorax. IMPRESSION: Persistent diffuse BILATERAL airspace infiltrates Electronically Signed   By: Ulyses Southward M.D.   On: 11/25/2018 08:08   Dg Chest Port 1 View  Result Date: 11/19/2018 CLINICAL DATA:  Fever EXAM: PORTABLE CHEST 1 VIEW COMPARISON:  11/13/2018, 11/11/2018 FINDINGS: Tracheostomy tube in satisfactory position. Feeding  tube coiled in the stomach. Right-sided PICC line with the tip projecting over the SVC. Worsening bilateral patchy interstitial and alveolar airspace disease throughout bilateral lungs concerning for multilobar pneumonia. No pleural effusion or pneumothorax. Stable cardiomediastinal silhouette. No aggressive osseous lesion. IMPRESSION: 1. Support lines and tubing in satisfactory position. 2. Worsening bilateral patchy interstitial and alveolar airspace disease throughout bilateral lungs concerning for multilobar pneumonia. Electronically Signed   By: Elige Ko   On: 11/19/2018 13:38   Dg Chest Port 1 View  Result Date: 11/13/2018 CLINICAL DATA:  Tracheostomy placement.  COVID-19. EXAM: PORTABLE CHEST 1 VIEW COMPARISON:  11/11/2018. FINDINGS: Tracheostomy is been inserted. Tip lies 4.5 cm above carina. BILATERAL pulmonary opacities persist, LEFT greater than RIGHT, not significantly improved. Cardiomegaly. Feeding tube duodenum. IMPRESSION: Satisfactory tracheostomy placement. No significant improvement aeration. Electronically Signed   By: Elsie Stain M.D.   On: 11/13/2018 16:16   Dg Chest Port 1 View  Result Date: 11/11/2018 CLINICAL DATA:  Follow-up aspiration EXAM: PORTABLE CHEST 1 VIEW COMPARISON:  11/10/2018 FINDINGS: Cardiac shadow is stable. Endotracheal tube and gastric catheter are noted in satisfactory position. Right-sided PICC line is noted at the cavoatrial junction. Patchy infiltrates are again seen bilaterally stable from the prior exam. No pneumothorax is noted. IMPRESSION: No change from the previous day. Electronically Signed   By: Alcide Clever M.D.   On: 11/11/2018 07:52   Dg Chest Port 1 View  Result Date: 11/10/2018 CLINICAL DATA:  Intubation.  OG tube placement. EXAM: PORTABLE CHEST 1 VIEW COMPARISON:  November 09, 2018 FINDINGS: The ETT remains in good position, 2 cm above the carina. Bilateral pulmonary infiltrates remain, similar on the left and more prominent on the right in the  interval. The OG tube terminates below today's film. No other changes. IMPRESSION: Support apparatus as above. Bilateral pulmonary infiltrates are stable on the left and mildly more prominent on the right in the interval. Electronically Signed   By: Gerome Sam III M.D   On: 11/10/2018 16:27   Dg Chest Port 1 View  Result Date: 11/09/2018 CLINICAL DATA:  53 year old female positive COVID-19 status post PICC placement. EXAM: PORTABLE CHEST 1 VIEW COMPARISON:  Chest radiograph dated 11/04/2018 FINDINGS: A  right-sided PICC is noted with tip over the right subclavian vein. Recommend further advancing by an additional approximately 10 cm. Endotracheal tube remains above the carina in similar position. Enteric tube extends below the diaphragm likely within the stomach. Interval progression of bilateral confluent densities since the prior radiograph. No large pleural effusion. There is no pneumothorax. No acute osseous pathology. Stable cardiac silhouette. IMPRESSION: 1. Right-sided PICC with tip over the right subclavian vein. Recommend further advancing an additional 10 cm. 2. Interval progression of bilateral confluent densities. Electronically Signed   By: Elgie CollardArash  Radparvar M.D.   On: 11/09/2018 03:25   Dg Abd Portable 1v  Result Date: 11/29/2018 CLINICAL DATA:  Dysphagia EXAM: PORTABLE ABDOMEN - 1 VIEW COMPARISON:  Plain film of the abdomen dated 11/21/2018. FINDINGS: Enteric tube appears adequately positioned in the stomach. Visualized bowel gas pattern is nonobstructive. No evidence of free intraperitoneal air. IMPRESSION: Enteric tube appears adequately positioned in the stomach. Electronically Signed   By: Bary RichardStan  Maynard M.D.   On: 11/29/2018 11:29   Dg Abd Portable 1v  Result Date: 11/21/2018 CLINICAL DATA:  NG tube placement. EXAM: PORTABLE ABDOMEN - 1 VIEW COMPARISON:  Plain films of the abdomen dated 11/20/2018 and 11/19/2018. FINDINGS: Weighted tip feeding tube now appears appropriately  positioned with tip in the proximal duodenum. Visualized bowel gas pattern is nonobstructive. IMPRESSION: Weighted tip feeding tube now appears appropriately positioned with tip in the proximal duodenum. Electronically Signed   By: Bary RichardStan  Maynard M.D.   On: 11/21/2018 12:24   Dg Abd Portable 1v  Result Date: 11/20/2018 CLINICAL DATA:  Encounter for NG tube placement EXAM: PORTABLE ABDOMEN - 1 VIEW COMPARISON:  11/19/2018 FINDINGS: Feeding tube coils in the stomach with the tip in the fundus. Nonobstructive bowel gas pattern. No organomegaly or free air. IMPRESSION: Feeding tube coils in the stomach with the tip in the fundus. Electronically Signed   By: Charlett NoseKevin  Dover M.D.   On: 11/20/2018 19:57   Dg Abd Portable 1v  Result Date: 11/19/2018 CLINICAL DATA:  Vomiting. EXAM: PORTABLE ABDOMEN - 1 VIEW COMPARISON:  None. FINDINGS: A feeding tube coils in the stomach with the distal tip in the fundus. No evidence of bowel obstruction. IMPRESSION: A feeding tube coils in the stomach. No evidence of bowel obstruction. No cause for vomiting noted. Electronically Signed   By: Gerome Samavid  Williams III M.D   On: 11/19/2018 16:43   Dg Swallowing Func-speech Pathology  Result Date: 12/01/2018 Objective Swallowing Evaluation: Type of Study: MBS-Modified Barium Swallow Study  Patient Details Name: Shannon BradfordDeborah Kulaga MRN: [id number] Date of Birth: 05/08/1966 Today's Date: 12/01/2018 Time: SLP Start Time (ACUTE ONLY): 1050 -SLP Stop Time (ACUTE ONLY): 1122 SLP Time Calculation (min) (ACUTE ONLY): 32 min Past Medical History: No past medical history on file. Past Surgical History: No past surgical history on file. HPI: Pt adm 5/20 with hypoxic resp failure due to Covid 19. Pt intubated 5/20 and extubated 6/8, with re-intubation 6/8; Trach 6/11. PMH - none.  Subjective: alert, distractible Assessment / Plan / Recommendation CHL IP CLINICAL IMPRESSIONS 12/01/2018 Clinical Impression Pt demonstrates moderate oropharyngeal dysphagia following  prolonged critical illness and intubation. 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 PMSV in place throught, pt on 2 liters [place]. Pt has relatively good oral function despite lingual wound that is healing well. LIngual manipulation for mastication, bolus formation and propulsion appears a little slow but adequate. However, premature spillage and delayed swallow initiation do lead to  prolonged pooling of liquids in pyriform sinsues prior to swallow initiation. WIth thin liquids penetration and aspiration via the postirior commisure is failry consistent, no response from pt but a cued cough ejects. Chin tuck not beneficial. Nectar thick liquids and solids were not aspirated but there was mild to moderate diffuse pharyngeal residual suggesting mild generalized muscle weakness. Pt able to follow cue to swallow twice. Recommend a dys 3 mech soft) diet with nectar thick liquids and intermittent instruction to swallow again. Will follow for tolerance.  SLP Visit Diagnosis Dysphagia, oropharyngeal phase (R13.12) Attention and concentration deficit following -- Frontal lobe and executive function deficit following -- Impact on safety and function Moderate aspiration risk   CHL IP TREATMENT RECOMMENDATION 12/01/2018 Treatment Recommendations Therapy as outlined in treatment plan below   Prognosis 12/01/2018 Prognosis for Safe Diet Advancement Good Barriers to Reach Goals Cognitive deficits Barriers/Prognosis Comment -- CHL IP DIET RECOMMENDATION 12/01/2018 SLP Diet Recommendations Dysphagia 3 (Mech soft) solids;Nectar thick liquid Liquid Administration via Cup;Straw Medication Administration Whole meds with liquid Compensations Slow rate;Small sips/bites;Follow solids with liquid;Multiple dry swallows after each bite/sip Postural Changes Seated upright at 90 degrees   CHL IP OTHER RECOMMENDATIONS 12/01/2018 Recommended Consults -- Oral Care Recommendations Oral care  BID Other Recommendations Order thickener from pharmacy   CHL IP FOLLOW UP RECOMMENDATIONS 12/01/2018 Follow up Recommendations Inpatient Rehab   CHL IP FREQUENCY AND DURATION 12/01/2018 Speech Therapy Frequency (ACUTE ONLY) min 2x/week Treatment Duration 2 weeks      CHL IP ORAL PHASE 12/01/2018 Oral Phase WFL Oral - Pudding Teaspoon -- Oral - Pudding Cup -- Oral - Honey Teaspoon -- Oral - Honey Cup -- Oral - Nectar Teaspoon -- Oral - Nectar Cup -- Oral - Nectar Straw -- Oral - Thin Teaspoon -- Oral - Thin Cup -- Oral - Thin Straw -- Oral - Puree -- Oral - Mech Soft -- Oral - Regular -- Oral - Multi-Consistency -- Oral - Pill -- Oral Phase - Comment --  CHL IP PHARYNGEAL PHASE 12/01/2018 Pharyngeal Phase Impaired Pharyngeal- Pudding Teaspoon -- Pharyngeal -- Pharyngeal- Pudding Cup -- Pharyngeal -- Pharyngeal- Honey Teaspoon -- Pharyngeal -- Pharyngeal- Honey Cup -- Pharyngeal -- Pharyngeal- Nectar Teaspoon -- Pharyngeal -- Pharyngeal- Nectar Cup Delayed swallow initiation-pyriform sinuses;Reduced tongue base retraction;Reduced laryngeal elevation;Reduced anterior laryngeal mobility;Reduced epiglottic inversion;Reduced pharyngeal peristalsis;Pharyngeal residue - valleculae;Pharyngeal residue - pyriform Pharyngeal -- Pharyngeal- Nectar Straw Delayed swallow initiation-pyriform sinuses;Reduced tongue base retraction;Reduced laryngeal elevation;Reduced anterior laryngeal mobility;Reduced epiglottic inversion;Reduced pharyngeal peristalsis;Pharyngeal residue - valleculae;Pharyngeal residue - pyriform Pharyngeal -- Pharyngeal- Thin Teaspoon -- Pharyngeal -- Pharyngeal- Thin Cup Delayed swallow initiation-pyriform sinuses;Reduced airway/laryngeal closure;Penetration/Aspiration during swallow;Trace aspiration;Pharyngeal residue - pyriform Pharyngeal Material enters airway, CONTACTS cords and not ejected out;Material enters airway, passes BELOW cords without attempt by patient to eject out (silent aspiration) Pharyngeal- Thin  Straw Delayed swallow initiation-pyriform sinuses;Reduced airway/laryngeal closure;Penetration/Aspiration during swallow;Trace aspiration;Pharyngeal residue - pyriform;Compensatory strategies attempted (with notebox) Pharyngeal Material enters airway, passes BELOW cords without attempt by patient to eject out (silent aspiration);Material enters airway, CONTACTS cords and not ejected out Pharyngeal- Puree Delayed swallow initiation-pyriform sinuses;Reduced tongue base retraction;Reduced laryngeal elevation;Reduced anterior laryngeal mobility;Reduced epiglottic inversion;Reduced pharyngeal peristalsis;Pharyngeal residue - valleculae;Pharyngeal residue - pyriform Pharyngeal -- Pharyngeal- Mechanical Soft -- Pharyngeal -- Pharyngeal- Regular Delayed swallow initiation-pyriform sinuses;Reduced tongue base retraction;Reduced laryngeal elevation;Reduced anterior laryngeal mobility;Reduced epiglottic inversion;Reduced pharyngeal peristalsis;Pharyngeal residue - valleculae;Pharyngeal residue - pyriform Pharyngeal -- Pharyngeal- Multi-consistency -- Pharyngeal -- Pharyngeal- Pill -- Pharyngeal -- Pharyngeal Comment --  No flowsheet data found. DeBlois,  Katherene Ponto 12/01/2018, 12:02 PM              [place] Ekg Site Rite  Result Date: 11/09/2018 If Site Rite image not attached, placement could not be confirmed due to current cardiac rhythm.

## 2018-12-08 NOTE — Progress Notes (Signed)
Occupational Therapy Treatment Patient Details Name: Shannon BradfordDeborah Kattner MRN: [id number] DOB: 12/23/1965 Today's Date: 12/08/2018    History of present illness Pt adm 5/20 with hypoxic resp failure due to Covid 19. Pt intubated 5/20 and extubated 6/8, with re-intubation 6/8; Trach 6/11. PMH - none. 6/17 foul smelling trach site/secretions, elevated WBC and temp; vomited tube feeds. Decannulated 12/08/2018.   OT comments  Pt is making steady progress. Excellent CIR candidate, but apparently planning to DC home. [facility] then to chair with RW with Mod A +2 . SpO2 ranged from 85 with finger probe on 2L to 100 using forehead probe and RA. RR increased with increased activity and required rest breaks. Pt demonstrates impaired executive level functioning skills and poor insight/reasoning/safety/judgement during session. Pt continues to be impulsive and is a high fall risk. Attempted to contact CM regarding DC needs. Pt will need equipment listed below in addition to HHOT. Will plan to Facetime sister tomorrow to educate on transfer techniques and DC recommendations.   Follow Up Recommendations  Home health OT;Supervision/Assistance - 24 hour    Equipment Recommendations  3 in 1 bedside commode;Wheelchair (measurements OT);Wheelchair cushion (measurements OT);Hospital bed;Other (comment)(RW)    Recommendations for Other Services Rehab consult    Precautions / Restrictions Precautions Precautions: Fall       Mobility Bed Mobility Overal bed mobility: Needs Assistance Bed Mobility: Sidelying to Sit;Sit to Sidelying   Sidelying to sit: Min guard     Sit to sidelying: Supervision    Transfers Overall transfer level: Needs assistance   Transfers: Sit to/from Stand;Stand Pivot Transfers Sit to Stand: Min assist;+2 safety/equipment Stand pivot transfers: Mod assist;+2 safety/equipment       General transfer comment: Very impulsive during transfers. Cuing pt on safety but  poor carry over    Balance     Sitting balance-[name] Scale: Fair       Standing balance-[name] Scale: Poor Standing balance comment: unable to release RW                           ADL either performed or assessed with clinical judgement   ADL Overall ADL's : Needs assistance/impaired Eating/Feeding: Set up;Sitting   Grooming: Minimal assistance Grooming Details (indicate cue type and reason): for hair Upper Body Bathing: Set up;Supervision/ safety;Sitting Upper Body Bathing Details (indicate cue type and reason): requires cues to thoroughly clean. poor attention to detail. Pt briefly washes under arms and states she is finished Lower Body Bathing: Sit to/from stand;Moderate assistance Lower Body Bathing Details (indicate cue type and reason): Pt unable to release RW with standing.     Lower Body Dressing: Maximal assistance;Sit to/from stand               Functional mobility during ADLs: Moderate assistance;+2 for safety/equipment;Rolling walker;Cueing for safety;Cueing for sequencing General ADL Comments: Limited by poor attention and poor endurance     Vision       Perception     Praxis      Cognition Arousal/Alertness: Awake/alert Behavior During Therapy: Impulsive Overall Cognitive Status: Impaired/Different from baseline Area of Impairment: Attention;Memory;Safety/judgement;Awareness;Problem solving                   Current Attention Level: Selective Memory: Decreased recall of precautions;Decreased short-term memory Following Commands: Follows one step commands consistently Safety/Judgement: Decreased awareness of safety;Decreased awareness of deficits Awareness: Emergent Problem Solving: Slow processing General Comments: Impulsive during session. Sitting  up on EOB, then flopping back onto bed wihtout warning. Appears agitated at times with therapists, stating "You can't tell me what to do", "you can't keep me here". Pt agreeable to  participate, then states she is returning back to bed and we "can't make her stay up". Educated pt on purpose of session, although pt has difficuolty with self monitioring and insight/safety/awareness/judgement regarding deficits.         Exercises     Shoulder Instructions       General Comments Facetimed Sister after session. Will need to [facility] during session so she can she transfer technique. Left pt with activites/homework to complete over the weekend. States she hasn't worked on anything on her own.     Pertinent Vitals/ Pain       Pain Assessment: No/denies pain  Home Living                                          Prior Functioning/Environment              Frequency  Min 3X/week        Progress Toward Goals  OT Goals(current goals can now be found in the care plan section)  Progress towards OT goals: Progressing toward goals  Acute Rehab OT Goals Patient Stated Goal: to go home and be with her family OT Goal Formulation: With patient/family Time For Goal Achievement: 12/15/18 Potential to Achieve Goals: Good ADL Goals Pt Will Perform Eating: with modified independence Pt Will Perform Grooming: with min assist;sitting Pt Will Perform Upper Body Bathing: with set-up;sitting Pt Will Transfer to Toilet: stand pivot transfer;bedside commode;with mod assist Pt Will Perform Toileting - Clothing Manipulation and hygiene: with mod assist;sit to/from stand;sitting/lateral leans Additional ADL Goal #1: Pt will perform bed mobility with Min A in preparation for ADLs Additional ADL Goal #2: Pt will tolerate sitting at EOB for 10 minutes with Min Guard A in preparation for ADLs Additional ADL Goal #3: Pt will demonstrate selective attention during ADL in a distracting environement with Min cues  Plan Discharge plan needs to be updated    Co-evaluation    PT/OT/SLP Co-Evaluation/Treatment: Yes Reason for Co-Treatment: Complexity of the patient's  impairments (multi-system involvement);Necessary to address cognition/behavior during functional activity;For patient/therapist safety   OT goals addressed during session: ADL's and self-care;Strengthening/ROM      AM-PAC OT "6 Clicks" Daily Activity     Outcome Measure   Help from another person eating meals?: A Little Help from another person taking care of personal grooming?: A Little Help from another person toileting, which includes using toliet, bedpan, or urinal?: A Lot Help from another person bathing (including washing, rinsing, drying)?: A Little Help from another person to put on and taking off regular upper body clothing?: A Lot Help from another person to put on and taking off regular lower body clothing?: A Lot 6 Click Score: 15    End of Session Equipment Utilized During Treatment: Gait belt;Rolling walker;Oxygen(2L vs RA)  OT Visit Diagnosis: Unsteadiness on feet (R26.81);Other abnormalities of gait and mobility (R26.89);Muscle weakness (generalized) (M62.81);Other symptoms and signs involving cognitive function   Activity Tolerance Patient tolerated treatment well   Patient Left in chair;with call bell/phone within reach;with chair alarm set   Nurse Communication Mobility status        Time: 1210-1315 OT Time Calculation (min): 65 min  Charges: OT General  Charges $OT Visit: 1 Visit OT Treatments $Self Care/Home Management : 8-22 mins $Therapeutic Exercise: 8-22 mins  Luisa DagoHilary Roddrick Sharron, OT/L   Acute OT Clinical Specialist Acute Rehabilitation Services Pager [phone] Office [phone]    [facility] Center[facility],HILLARY 12/08/2018, 2:50 PM

## 2018-12-08 NOTE — Progress Notes (Signed)
**Note De-Identified  Obfuscation** RT note: decannulation tolerated well. STOMA WNL and covered with guaze and flexible soft tape.  RRT to continue to monitor.

## 2018-12-08 NOTE — Progress Notes (Signed)
Physical Therapy Treatment Patient Details Name: Shannon Meyer MRN: [id number] DOB: July 31, 1965 Today's Date: 12/08/2018    History of Present Illness Pt adm 5/20 with hypoxic resp failure due to Covid 19. Pt intubated 5/20 and extubated 6/8, with re-intubation 6/8; Trach 6/11. PMH - none. 6/17 foul smelling trach site/secretions, elevated WBC and temp; vomited tube feeds. Decannulated 12/08/2018.    PT Comments    The patient reports that she will go to sister's home. Sister confirmed. Patient currently requires mod assistance as patient is impulsive with mobility. Patient would not attempt to ambulate today, appears to have strength to do so as she stood at  [facility] and could "March". A few steps.  Patient will require CME noted below. Continue with family instruction on safety via facetime as able.    Follow Up Recommendations  Home health PT     Equipment Recommendations  Rolling walker with 5" wheels;Wheelchair (measurements PT);Hospital bed;Wheelchair cushion (measurements PT)    Recommendations for Other Services       Precautions / Restrictions Precautions Precautions: Fall Precaution Comments: monitor vitals, on forehead    Mobility  Bed Mobility Overal bed mobility: Needs Assistance Bed Mobility: Sidelying to Sit;Sit to Sidelying   Sidelying to sit: Min guard     Sit to sidelying: Supervision    Transfers Overall transfer level: Needs assistance   Transfers: Sit to/from Stand;Stand Pivot Transfers Sit to Stand: Min assist;+2 safety/equipment Stand pivot transfers: Mod assist;+2 safety/equipment       General transfer comment: Very impulsive during transfers. Cuing pt on safety but poor carry over, tranferred to [facility], Stood  and took 3 steps in place while standing  Ambulation/Gait             General Gait Details: pt. would not attempt   Stairs             Wheelchair Mobility    Modified Rankin (Stroke Patients Only)       Balance      Sitting balance-[name] Scale: Fair       Standing balance-[name] Scale: Poor Standing balance comment: unable to release RW                            Cognition Arousal/Alertness: Awake/alert Behavior During Therapy: Impulsive Overall Cognitive Status: Impaired/Different from baseline Area of Impairment: Attention;Memory;Safety/judgement;Awareness;Problem solving                   Current Attention Level: Selective Memory: Decreased recall of precautions;Decreased short-term memory Following Commands: Follows one step commands consistently Safety/Judgement: Decreased awareness of safety;Decreased awareness of deficits Awareness: Emergent Problem Solving: Slow processing General Comments: Impulsive during session. Sitting up on EOB, then flopping back onto bed wihtout warning. Appears agitated at times with therapists, stating "You can't tell me what to do", "you can't keep me here". Pt agreeable to participate, then states she is returning back to bed and we "can't make her stay up". Educated pt on purpose of session, although pt has difficuolty with self monitioring and insight/safety/awareness/judgement regarding deficits.       Exercises      General Comments General comments (skin integrity, edema, etc.): Facetimed Sister after session. Will need to [facility] during session so she can she transfer technique. Left pt with activites/homework to complete over the weekend. States she hasn't worked on anything on her own.       Pertinent Vitals/Pain Pain Assessment: No/denies pain    Home  Living                      Prior Function            PT Goals (current goals can now be found in the care plan section) Acute Rehab PT Goals Patient Stated Goal: to go home and be with her family PT Goal Formulation: With patient/family Time For Goal Achievement: 12/22/18 Potential to Achieve Goals: Fair Progress towards PT goals: Progressing toward goals     Frequency    Min 3X/week      PT Plan Current plan remains appropriate    Co-evaluation PT/OT/SLP Co-Evaluation/Treatment: Yes Reason for Co-Treatment: For patient/therapist safety PT goals addressed during session: Mobility/safety with mobility OT goals addressed during session: ADL's and self-care      AM-PAC PT "6 Clicks" Mobility   Outcome Measure  Help needed turning from your back to your side while in a flat bed without using bedrails?: A Little Help needed moving from lying on your back to sitting on the side of a flat bed without using bedrails?: A Little Help needed moving to and from a bed to a chair (including a wheelchair)?: A Lot Help needed standing up from a chair using your arms (e.g., wheelchair or bedside chair)?: A Lot Help needed to walk in hospital room?: Total Help needed climbing 3-5 steps with a railing? : Total 6 Click Score: 12    End of Session Equipment Utilized During Treatment: Gait belt;Oxygen Activity Tolerance: Patient tolerated treatment well Patient left: in chair;with call bell/phone within reach;with chair alarm set Nurse Communication: Mobility status;Need for lift equipment;Other (comment) PT Visit Diagnosis: Other abnormalities of gait and mobility (R26.89);Muscle weakness (generalized) (M62.81)     Time: 4098-11911210-1312 PT Time Calculation (min) (ACUTE ONLY): 62 min  Charges:  $Therapeutic Activity: 23-37 mins                     Blanchard KelchKaren Sophi Meyer PT Acute Rehabilitation Services [phone] Office [phone]    Rada HayHill, Shannon Meyer 12/08/2018, 3:24 PM

## 2018-12-09 MED ORDER — METOPROLOL TARTRATE 50 MG PO TABS: 50.0000 mg | ORAL_TABLET | Freq: Two times a day (BID) | ORAL | 0 refills | Status: DC

## 2018-12-09 MED ORDER — ASPIRIN 81 MG PO TBEC: 81.0000 mg | DELAYED_RELEASE_TABLET | Freq: Every day | ORAL | Status: DC

## 2018-12-09 MED ORDER — ENSURE ENLIVE PO LIQD: 237.0000 mL | Freq: Three times a day (TID) | ORAL | 0 refills | Status: AC

## 2018-12-09 MED ORDER — FAMOTIDINE 20 MG PO TABS: 20.0000 mg | ORAL_TABLET | Freq: Every day | ORAL | 0 refills | Status: DC

## 2018-12-09 NOTE — Progress Notes (Signed)
SATURATION QUALIFICATIONS: (This note is used to comply with regulatory documentation for home oxygen)  Patient Saturations on Room Air at Rest =88-93%  Patient Saturations on Room Air while Ambulating = 84-96%  Patient Saturations on 2 Liters of oxygen while Ambulating =91-93 %  Please briefly explain why patient needs home oxygen:  Post Trach. Decannulation 7/6. Still has needs for O2 at rest, upon exertion and while sleeping.

## 2018-12-09 NOTE — Progress Notes (Signed)
Attempted to reach primary contact, [name] (daughter) at all three phone numbers of record.  [phone] voicemail full [phone] left voicemail [phone] unable to reach

## 2018-12-09 NOTE — Progress Notes (Signed)
Patient on room air after working with OT.  RT replaced gauze and tape at stoma site. Patient tolerated well.

## 2018-12-09 NOTE — Progress Notes (Signed)
Inpatient Rehab Admissions:  Inpatient Rehab Consult received.  Note pt with plans to d/c to sister's home. Will sign off at this time.   Shann Medal, PT, DPT Admissions Coordinator [phone] 12/09/18  1:29 PM

## 2018-12-09 NOTE — Care Management (Signed)
Case manager called to schedule [facility] transport for patient. Next available. Message sent to bedside nurse. Patient's oxygen and DME have been delivered to her sister's home.

## 2018-12-09 NOTE — Plan of Care (Signed)
  Problem: Education: Goal: Knowledge of General Education information will improve Description: Including pain rating scale, medication(s)/side effects and non-pharmacologic comfort measures Outcome: Completed/Met   Problem: Health Behavior/Discharge Planning: Goal: Ability to manage health-related needs will improve Outcome: Completed/Met   Problem: Clinical Measurements: Goal: Ability to maintain clinical measurements within normal limits will improve Outcome: Completed/Met Goal: Will remain free from infection Outcome: Completed/Met Goal: Diagnostic test results will improve Outcome: Completed/Met Goal: Respiratory complications will improve Outcome: Completed/Met Goal: Cardiovascular complication will be avoided Outcome: Completed/Met   Problem: Activity: Goal: Risk for activity intolerance will decrease Outcome: Completed/Met   Problem: Nutrition: Goal: Adequate nutrition will be maintained Outcome: Completed/Met   Problem: Coping: Goal: Level of anxiety will decrease Outcome: Completed/Met   Problem: Elimination: Goal: Will not experience complications related to bowel motility Outcome: Completed/Met Goal: Will not experience complications related to urinary retention Outcome: Completed/Met   Problem: Pain Managment: Goal: General experience of comfort will improve Outcome: Completed/Met   Problem: Safety: Goal: Ability to remain free from injury will improve Outcome: Completed/Met   Problem: Skin Integrity: Goal: Risk for impaired skin integrity will decrease Outcome: Completed/Met   Problem: Activity: Goal: Ability to tolerate increased activity will improve Outcome: Completed/Met   Problem: Respiratory: Goal: Ability to maintain a clear airway and adequate ventilation will improve Outcome: Completed/Met   Problem: Role Relationship: Goal: Method of communication will improve Outcome: Completed/Met   Problem: Education: Goal: Knowledge of risk  factors and measures for prevention of condition will improve Outcome: Completed/Met   Problem: Coping: Goal: Psychosocial and spiritual needs will be supported Outcome: Completed/Met   Problem: Respiratory: Goal: Will maintain a patent airway Outcome: Completed/Met Goal: Complications related to the disease process, condition or treatment will be avoided or minimized Outcome: Completed/Met   Problem: Education: Goal: Knowledge about tracheostomy care/management will improve Outcome: Completed/Met   Problem: Activity: Goal: Ability to tolerate increased activity will improve Outcome: Completed/Met   Problem: Health Behavior/Discharge Planning: Goal: Ability to manage tracheostomy will improve Outcome: Completed/Met   Problem: Respiratory: Goal: Patent airway maintenance will improve Outcome: Completed/Met   Problem: Role Relationship: Goal: Ability to communicate will improve Outcome: Completed/Met  Patient discharged with home health.

## 2018-12-09 NOTE — Care Management (Signed)
Case manager received call requesting oxygen be arranged for patient. CM has requested saturation documentation and order be entered and signed, contacted Learta Codding with [employer] to arrange oxygen. Patient will be able to discharge when concentrator and oxygen have been delivered to the home.    Ricki Miller, RN BSN Case Manager [phone]

## 2018-12-09 NOTE — Progress Notes (Signed)
Discharge teaching completed with the patient and her daughter via phone. Questions encouraged and answered. Patient transported via PTAR to sister's residence.

## 2018-12-09 NOTE — Progress Notes (Signed)
Physical Therapy Treatment Patient Details Name: Shannon BradfordDeborah Meyer MRN: [id number] DOB: 06/09/1965 Today's Date: 12/09/2018    History of Present Illness Pt adm 5/20 with hypoxic resp failure due to Covid 19. Pt intubated 5/20 and extubated 6/8, with re-intubation 6/8; Trach 6/11. PMH - none. 6/17 foul smelling trach site/secretions, elevated WBC and temp; vomited tube feeds. Decannulated 12/08/2018.    PT Comments    The patient  Is progressing well. Patient  Plans Dc to home of sister. Updated  Shannon DandyMary, sister, about patient's mobility and safety, that patient requires 1:1 assistance for all OOB activities.  Patient monitored on RA  via finger, ear and forehead pulse ox: ear 92%,  Forehead 100%, Finger consistently reading 70's.. Patient's RR up in 30's only when mobilizing. Patient left on RA with probe on forehead, satting at 100% on RA.   Patient provided  A pulse ox for home which was checked with other sites with it reading in the 70-80%, therefore, is not  Acceptable to use as a monitor of SaO2.r    Follow Up Recommendations  Home health PT     Equipment Recommendations  Rolling walker with 5" wheels;Wheelchair (measurements PT);Hospital bed;Wheelchair cushion (measurements PT);3in1 (PT)    Recommendations for Other Services       Precautions / Restrictions Precautions Precaution Comments: monitor vitals, on forehead    Mobility  Bed Mobility     Rolling: Supervision   Supine to sit: Supervision Sit to supine: Supervision   General bed mobility comments: no assist for sitting up and lying down. Flopped back x 1, Moves quickly, almost  ballistic.so needs close guarding  Transfers Overall transfer level: Needs assistance Equipment used: Rolling walker (2 wheeled) Transfers: Sit to/from [employer] CorporationStand;Stand Pivot Transfers Sit to Stand: Min assist Stand pivot transfers: Min assist;Mod assist       General transfer comment: Very impulsive during transfers. Cuing pt on safety but poor  carry over[facility] from it x 3.Then took steps back to bed.  Ambulation/Gait             General Gait Details: pt. would not attempt   Stairs             Wheelchair Mobility    Modified Rankin (Stroke Patients Only)       Balance Overall balance assessment: Needs assistance Sitting-balance support: No upper extremity supported Sitting balance-[name] Scale: Fair     Standing balance support: During functional activity;Single extremity supported Standing balance-[name] Scale: Poor Standing balance comment: needs at least 1 UE to support during ADL's, tends to lean backwards, especially when first standing up                            Cognition Arousal/Alertness: Awake/alert Behavior During Therapy: Impulsive Overall Cognitive Status: Impaired/Different from baseline Area of Impairment: Attention;Memory;Safety/judgement;Awareness;Problem solving                   Current Attention Level: Selective Memory: Decreased recall of precautions;Decreased short-term memory Following Commands: Follows one step commands consistently Safety/Judgement: Decreased awareness of safety;Decreased awareness of deficits Awareness: Emergent Problem Solving: Slow processing General Comments: Impulsive during session. Sitting up on EOB, then flopping back onto bed wihtout warning. Patient does know her PW on phone. patient requires redirectiopn and multimodal cues  to  carryout activity. At times, states" I can't do that" I am not going to wear  these shoes.      Exercises  General Comments        Pertinent Vitals/Pain Pain Score: 0-No pain    Home Living                      Prior Function            PT Goals (current goals can now be found in the care plan section) Progress towards PT goals: Progressing toward goals    Frequency    Min 3X/week      PT Plan Current plan remains appropriate;Discharge plan needs to  be updated    Co-evaluation PT/OT/SLP Co-Evaluation/Treatment: Yes Reason for Co-Treatment: For patient/therapist safety PT goals addressed during session: Mobility/safety with mobility OT goals addressed during session: ADL's and self-care      AM-PAC PT "6 Clicks" Mobility   Outcome Measure  Help needed turning from your back to your side while in a flat bed without using bedrails?: A Little Help needed moving from lying on your back to sitting on the side of a flat bed without using bedrails?: A Little Help needed moving to and from a bed to a chair (including a wheelchair)?: A Lot Help needed standing up from a chair using your arms (e.g., wheelchair or bedside chair)?: A Lot Help needed to walk in hospital room?: Total Help needed climbing 3-5 steps with a railing? : Total 6 Click Score: 12    End of Session Equipment Utilized During Treatment: Gait belt Activity Tolerance: Patient tolerated treatment well Patient left: in bed;with call bell/phone within reach Nurse Communication: Mobility status PT Visit Diagnosis: Other abnormalities of gait and mobility (R26.89);Muscle weakness (generalized) (M62.81)     Time: 5638-7564 PT Time Calculation (min) (ACUTE ONLY): 61 min  Charges:  $Therapeutic Activity: 23-37 mins                   Shannon Meyer PT Acute Rehabilitation Services [phone] Office [phone]    Shannon Meyer 12/09/2018, 1:08 PM

## 2018-12-09 NOTE — TOC Transition Note (Signed)
Transition of Care [facility]) - CM/SW Discharge Note   Patient Details  Name: Shannon Meyer MRN: [id number] Date of Birth: 02-01-1966  Transition of Care [facility]) CM/SW Contact:  Ninfa Meeker, RN Phone Number: [phone] (working remotely) 12/09/2018, 8:50 AM   Clinical Narrative: 53 yr old female admitted 47 days ago with COVID 83. Patient's trach was removed on 12/08/18 and thankfully, she has improved enough to be able to discharge home. Case manager spoke with patient via telephone and also her daughter, [name], concerning discharge plan and DME needs. Patient will be going to her sister's home to recover- [name], [address].[place], [place]. Patient will discharge home via [facility].Referral for Home Health was given to [facility], Neoma Laming.       Final next level of care: [place] Barriers to Discharge: No Barriers Identified   Patient Goals and CMS Choice Patient states their goals for this hospitalization and ongoing recovery are:: continue to get better   Choice offered to / list presented to : NA, Adult Children(patient unisured had to use charity agency)  Discharge Placement                       Discharge Plan and Services   Discharge Planning Services: CM Consult Post Acute Care Choice: Durable Medical Equipment, Home Health          DME Arranged: 3-N-1, Hospital bed, Walker rolling, Wheelchair manual DME Agency: [place] Date DME Agency Contacted: 12/09/18 Time DME Agency Contacted: [phone] Representative spoke with at DME Agency: Learta Codding HH Arranged: PT, RN, OT [facility] Agency: [place] Date [place]: 12/09/18 Time [place]: [id number] Representative spoke with at [place]: [place] ([place]) Interventions     Readmission Risk Interventions No flowsheet data found.

## 2018-12-09 NOTE — Discharge Instructions (Signed)
COVID-19 °COVID-19 is a respiratory infection that is caused by a virus called severe acute respiratory syndrome coronavirus 2 (SARS-CoV-2). The disease is also known as coronavirus disease or novel coronavirus. In some people, the virus may not cause any symptoms. In others, it may cause a serious infection. The infection can get worse quickly and can lead to complications, such as: °· Pneumonia, or infection of the lungs. °· Acute respiratory distress syndrome or ARDS. This is fluid build-up in the lungs. °· Acute respiratory failure. This is a condition in which there is not enough oxygen passing from the lungs to the body. °· Sepsis or septic shock. This is a serious bodily reaction to an infection. °· Blood clotting problems. °· Secondary infections due to bacteria or fungus. °The virus that causes COVID-19 is contagious. This means that it can spread from person to person through droplets from coughs and sneezes (respiratory secretions). °What are the causes? °This illness is caused by a virus. You may catch the virus by: °· Breathing in droplets from an infected person's cough or sneeze. °· Touching something, like a table or a doorknob, that was exposed to the virus (contaminated) and then touching your mouth, nose, or eyes. °What increases the risk? °Risk for infection °You are more likely to be infected with this virus if you: °· Live in or travel to an area with a COVID-19 outbreak. °· Come in contact with a sick person who recently traveled to an area with a COVID-19 outbreak. °· Provide care for or live with a person who is infected with COVID-19. °Risk for serious illness °You are more likely to become seriously ill from the virus if you: °· Are 65 years of age or older. °· Have a long-term disease that lowers your body's ability to fight infection (immunocompromised). °· Live in a nursing home or long-term care facility. °· Have a long-term (chronic) disease such as: °? Chronic lung disease, including  chronic obstructive pulmonary disease or asthma °? Heart disease. °? Diabetes. °? Chronic kidney disease. °? Liver disease. °· Are obese. °What are the signs or symptoms? °Symptoms of this condition can range from mild to severe. Symptoms may appear any time from 2 to 14 days after being exposed to the virus. They include: °· A fever. °· A cough. °· Difficulty breathing. °· Chills. °· Muscle pains. °· A sore throat. °· Loss of taste or smell. °Some people may also have stomach problems, such as nausea, vomiting, or diarrhea. °Other people may not have any symptoms of COVID-19. °How is this diagnosed? °This condition may be diagnosed based on: °· Your signs and symptoms, especially if: °? You live in an area with a COVID-19 outbreak. °? You recently traveled to or from an area where the virus is common. °? You provide care for or live with a person who was diagnosed with COVID-19. °· A physical exam. °· Lab tests, which may include: °? A nasal swab to take a sample of fluid from your nose. °? A throat swab to take a sample of fluid from your throat. °? A sample of mucus from your lungs (sputum). °? Blood tests. °· Imaging tests, which may include, X-rays, CT scan, or ultrasound. °How is this treated? °At present, there is no medicine to treat COVID-19. Medicines that treat other diseases are being used on a trial basis to see if they are effective against COVID-19. °Your health care provider will talk with you about ways to treat your symptoms. For most   people, the infection is mild and can be managed at home with rest, fluids, and over-the-counter medicines. °Treatment for a serious infection usually takes places in a hospital intensive care unit (ICU). It may include one or more of the following treatments. These treatments are given until your symptoms improve. °· Receiving fluids and medicines through an IV. °· Supplemental oxygen. Extra oxygen is given through a tube in the nose, a face mask, or a  hood. °· Positioning you to lie on your stomach (prone position). This makes it easier for oxygen to get into the lungs. °· Continuous positive airway pressure (CPAP) or bi-level positive airway pressure (BPAP) machine. This treatment uses mild air pressure to keep the airways open. A tube that is connected to a motor delivers oxygen to the body. °· Ventilator. This treatment moves air into and out of the lungs by using a tube that is placed in your windpipe. °· Tracheostomy. This is a procedure to create a hole in the neck so that a breathing tube can be inserted. °· Extracorporeal membrane oxygenation (ECMO). This procedure gives the lungs a chance to recover by taking over the functions of the heart and lungs. It supplies oxygen to the body and removes carbon dioxide. °Follow these instructions at home: °Lifestyle °· If you are sick, stay home except to get medical care. Your health care provider will tell you how long to stay home. Call your health care provider before you go for medical care. °· Rest at home as told by your health care provider. °· Do not use any products that contain nicotine or tobacco, such as cigarettes, e-cigarettes, and chewing tobacco. If you need help quitting, ask your health care provider. °· Return to your normal activities as told by your health care provider. Ask your health care provider what activities are safe for you. °General instructions °· Take over-the-counter and prescription medicines only as told by your health care provider. °· Drink enough fluid to keep your urine pale yellow. °· Keep all follow-up visits as told by your health care provider. This is important. °How is this prevented? ° °There is no vaccine to help prevent COVID-19 infection. However, there are steps you can take to protect yourself and others from this virus. °To protect yourself:  °· Do not travel to areas where COVID-19 is a risk. The areas where COVID-19 is reported change often. To identify  high-risk areas and travel restrictions, check the CDC travel website: wwwnc.cdc.gov/travel/notices °· If you live in, or must travel to, an area where COVID-19 is a risk, take precautions to avoid infection. °? Stay away from people who are sick. °? Wash your hands often with soap and water for 20 seconds. If soap and water are not available, use an alcohol-based hand sanitizer. °? Avoid touching your mouth, face, eyes, or nose. °? Avoid going out in public, follow guidance from your state and local health authorities. °? If you must go out in public, wear a cloth face covering or face mask. °? Disinfect objects and surfaces that are frequently touched every day. This may include: °§ Counters and tables. °§ Doorknobs and light switches. °§ Sinks and faucets. °§ Electronics, such as phones, remote controls, keyboards, computers, and tablets. °To protect others: °If you have symptoms of COVID-19, take steps to prevent the virus from spreading to others. °· If you think you have a COVID-19 infection, contact your health care provider right away. Tell your health care team that you think you may   have a COVID-19 infection. °· Stay home. Leave your house only to seek medical care. Do not use public transport. °· Do not travel while you are sick. °· Wash your hands often with soap and water for 20 seconds. If soap and water are not available, use alcohol-based hand sanitizer. °· Stay away from other members of your household. Let healthy household members care for children and pets, if possible. If you have to care for children or pets, wash your hands often and wear a mask. If possible, stay in your own room, separate from others. Use a different bathroom. °· Make sure that all people in your household wash their hands well and often. °· Cough or sneeze into a tissue or your sleeve or elbow. Do not cough or sneeze into your hand or into the air. °· Wear a cloth face covering or face mask. °Where to find more  information °· Centers for Disease Control and Prevention: www.cdc.gov/coronavirus/2019-ncov/index.html °· World Health Organization: www.who.int/health-topics/coronavirus °Contact a health care provider if: °· You live in or have traveled to an area where COVID-19 is a risk and you have symptoms of the infection. °· You have had contact with someone who has COVID-19 and you have symptoms of the infection. °Get help right away if: °· You have trouble breathing. °· You have pain or pressure in your chest. °· You have confusion. °· You have bluish lips and fingernails. °· You have difficulty waking from sleep. °· You have symptoms that get worse. °These symptoms may represent a serious problem that is an emergency. Do not wait to see if the symptoms will go away. Get medical help right away. Call your local emergency services (911 in the [place] Do not drive yourself to the hospital. Let the emergency medical personnel know if you think you have COVID-19. °Summary °· COVID-19 is a respiratory infection that is caused by a virus. It is also known as coronavirus disease or novel coronavirus. It can cause serious infections, such as pneumonia, acute respiratory distress syndrome, acute respiratory failure, or sepsis. °· The virus that causes COVID-19 is contagious. This means that it can spread from person to person through droplets from coughs and sneezes. °· You are more likely to develop a serious illness if you are 65 years of age or older, have a weak immunity, live in a nursing home, or have chronic disease. °· There is no medicine to treat COVID-19. Your health care provider will talk with you about ways to treat your symptoms. °· Take steps to protect yourself and others from infection. Wash your hands often and disinfect objects and surfaces that are frequently touched every day. Stay away from people who are sick and wear a mask if you are sick. °This information is not intended to replace advice given to you by  your health care provider. Make sure you discuss any questions you have with your health care provider. °Document Released: 06/26/2018 Document Revised: 10/16/2018 Document Reviewed: 06/26/2018 °Elsevier Patient Education © 2020 [employer] ° °COVID-19: How to Protect Yourself and Others °Know how it spreads °· There is currently no vaccine to prevent coronavirus disease 2019 (COVID-19). °· The best way to prevent illness is to avoid being exposed to this virus. °· The virus is thought to spread mainly from person-to-person. °? Between people who are in close contact with one another (within about 6 feet). °? Through respiratory droplets produced when an infected person coughs, sneezes or talks. °? These droplets can land in   the mouths or noses of people who are nearby or possibly be inhaled into the lungs. °? Some recent studies have suggested that COVID-19 may be spread by people who are not showing symptoms. °Everyone should °Clean your hands often °· Wash your hands often with soap and water for at least 20 seconds especially after you have been in a public place, or after blowing your nose, coughing, or sneezing. °· If soap and water are not readily available, use a hand sanitizer that contains at least 60% alcohol. Cover all surfaces of your hands and rub them together until they feel dry. °· Avoid touching your eyes, nose, and mouth with unwashed hands. °Avoid close contact °· Stay home if you are sick. °· Avoid close contact with people who are sick. °· Put distance between yourself and other people. °? Remember that some people without symptoms may be able to spread virus. °? This is especially important for people who are at higher risk of getting very sick.www.cdc.gov/coronavirus/2019-ncov/need-extra-precautions/people-at-higher-risk.html °Cover your mouth and nose with a cloth face cover when around others °· You could spread COVID-19 to others even if you do not feel sick. °· Everyone should wear a  cloth face cover when they have to go out in public, for example to the grocery store or to pick up other necessities. °? Cloth face coverings should not be placed on young children under age 2, anyone who has trouble breathing, or is unconscious, incapacitated or otherwise unable to remove the mask without assistance. °· The cloth face cover is meant to protect other people in case you are infected. °· Do NOT use a facemask meant for a healthcare worker. °· Continue to keep about 6 feet between yourself and others. The cloth face cover is not a substitute for social distancing. °Cover coughs and sneezes °· If you are in a private setting and do not have on your cloth face covering, remember to always cover your mouth and nose with a tissue when you cough or sneeze or use the inside of your elbow. °· Throw used tissues in the trash. °· Immediately wash your hands with soap and water for at least 20 seconds. If soap and water are not readily available, clean your hands with a hand sanitizer that contains at least 60% alcohol. °Clean and disinfect °· Clean AND disinfect frequently touched surfaces daily. This includes tables, doorknobs, light switches, countertops, handles, desks, phones, keyboards, toilets, faucets, and sinks. www.cdc.gov/coronavirus/2019-ncov/prevent-getting-sick/disinfecting-your-home.html °· If surfaces are dirty, clean them: Use detergent or soap and water prior to disinfection. °· Then, use a household disinfectant. You can see a list of EPA-registered household disinfectants here. °cdc.gov/coronavirus °10/07/2018 °This information is not intended to replace advice given to you by your health care provider. Make sure you discuss any questions you have with your health care provider. °Document Released: 09/16/2018 Document Revised: 10/15/2018 Document Reviewed: 09/16/2018 °Elsevier Patient Education © 2020 [employer] ° ° °COVID-19 Frequently Asked Questions °COVID-19 (coronavirus disease) is  an infection that is caused by a large family of viruses. Some viruses cause illness in people and others cause illness in animals like camels, cats, and bats. In some cases, the viruses that cause illness in animals can spread to humans. °Where did the coronavirus come from? °In December 2019, [place] told the World Health Organization (WHO) of several cases of lung disease (human respiratory illness). These cases were linked to an open seafood and livestock market in the city of [place]. The link to the seafood and   livestock market suggests that the virus may have spread from animals to humans. However, since that first outbreak in December, the virus has also been shown to spread from person to person. °What is the name of the disease and the virus? °Disease name °Early on, this disease was called novel coronavirus. This is because scientists determined that the disease was caused by a new (novel) respiratory virus. The World Health Organization (WHO) has now named the disease COVID-19, or coronavirus disease. °Virus name °The virus that causes the disease is called severe acute respiratory syndrome coronavirus 2 (SARS-CoV-2). °More information on disease and virus naming °World Health Organization (WHO): www.who.int/emergencies/diseases/novel-coronavirus-2019/technical-guidance/naming-the-coronavirus-disease-(covid-2019)-and-the-virus-that-causes-it °Who is at risk for complications from coronavirus disease? °Some people may be at higher risk for complications from coronavirus disease. This includes older adults and people who have chronic diseases, such as heart disease, diabetes, and lung disease. °If you are at higher risk for complications, take these extra precautions: °· Avoid close contact with people who are sick or have a fever or cough. Stay at least 3-6 ft (1-2 m) away from them, if possible. °· Wash your hands often with soap and water for at least 20 seconds. °· Avoid touching your face, mouth, nose, or  eyes. °· Keep supplies on hand at home, such as food, medicine, and cleaning supplies. °· Stay home as much as possible. °· Avoid social gatherings and travel. °How does coronavirus disease spread? °The virus that causes coronavirus disease spreads easily from person to person (is contagious). There are also cases of community-spread disease. This means the disease has spread to: °· People who have no known contact with other infected people. °· People who have not traveled to areas where there are known cases. °It appears to spread from one person to another through droplets from coughing or sneezing. °Can I get the virus from touching surfaces or objects? °There is still a lot that we do not know about the virus that causes coronavirus disease. Scientists are basing a lot of information on what they know about similar viruses, such as: °· Viruses cannot generally survive on surfaces for long. They need a human body (host) to survive. °· It is more likely that the virus is spread by close contact with people who are sick (direct contact), such as through: °? Shaking hands or hugging. °? Breathing in respiratory droplets that travel through the air. This can happen when an infected person coughs or sneezes on or near other people. °· It is less likely that the virus is spread when a person touches a surface or object that has the virus on it (indirect contact). The virus may be able to enter the body if the person touches a surface or object and then touches his or her face, eyes, nose, or mouth. °Can a person spread the virus without having symptoms of the disease? °It may be possible for the virus to spread before a person has symptoms of the disease, but this is most likely not the main way the virus is spreading. It is more likely for the virus to spread by being in close contact with people who are sick and breathing in the respiratory droplets of a sick person's cough or sneeze. °What are the symptoms of  coronavirus disease? °Symptoms vary from person to person and can range from mild to severe. Symptoms may include: °· Fever. °· Cough. °· Tiredness, weakness, or fatigue. °· Fast breathing or feeling short of breath. °These symptoms can appear anywhere from   2 to 14 days after you have been exposed to the virus. If you develop symptoms, call your health care provider. People with severe symptoms may need hospital care. °If I am exposed to the virus, how long does it take before symptoms start? °Symptoms of coronavirus disease may appear anywhere from 2 to 14 days after a person has been exposed to the virus. If you develop symptoms, call your health care provider. °Should I be tested for this virus? °Your health care provider will decide whether to test you based on your symptoms, history of exposure, and your risk factors. °How does a health care provider test for this virus? °Health care providers will collect samples to send for testing. Samples may include: °· Taking a swab of fluid from the nose. °· Taking fluid from the lungs by having you cough up mucus (sputum) into a sterile cup. °· Taking a blood sample. °· Taking a stool or urine sample. °Is there a treatment or vaccine for this virus? °Currently, there is no vaccine to prevent coronavirus disease. Also, there are no medicines like antibiotics or antivirals to treat the virus. A person who becomes sick is given supportive care, which means rest and fluids. A person may also relieve his or her symptoms by using over-the-counter medicines that treat sneezing, coughing, and runny nose. These are the same medicines that a person takes for the common cold. °If you develop symptoms, call your health care provider. People with severe symptoms may need hospital care. °What can I do to protect myself and my family from this virus? ° °  ° °You can protect yourself and your family by taking the same actions that you would take to prevent the spread of other viruses.  Take the following actions: °· Wash your hands often with soap and water for at least 20 seconds. If soap and water are not available, use alcohol-based hand sanitizer. °· Avoid touching your face, mouth, nose, or eyes. °· Cough or sneeze into a tissue, sleeve, or elbow. Do not cough or sneeze into your hand or the air. °? If you cough or sneeze into a tissue, throw it away immediately and wash your hands. °· Disinfect objects and surfaces that you frequently touch every day. °· Avoid close contact with people who are sick or have a fever or cough. Stay at least 3-6 ft (1-2 m) away from them, if possible. °· Stay home if you are sick, except to get medical care. Call your health care provider before you get medical care. °· Make sure your vaccines are up to date. Ask your health care provider what vaccines you need. °What should I do if I need to travel? °Follow travel recommendations from your local health authority, the CDC, and WHO. °Travel information and advice °· Centers for Disease Control and Prevention (CDC): www.cdc.gov/coronavirus/2019-ncov/travelers/index.html °· World Health Organization (WHO): www.who.int/emergencies/diseases/novel-coronavirus-2019/travel-advice °Know the risks and take action to protect your health °· You are at higher risk of getting coronavirus disease if you are traveling to areas with an outbreak or if you are exposed to travelers from areas with an outbreak. °· Wash your hands often and practice good hygiene to lower the risk of catching or spreading the virus. °What should I do if I am sick? °General instructions to stop the spread of infection °· Wash your hands often with soap and water for at least 20 seconds. If soap and water are not available, use alcohol-based hand sanitizer. °· Cough or sneeze into a   tissue, sleeve, or elbow. Do not cough or sneeze into your hand or the air. °· If you cough or sneeze into a tissue, throw it away immediately and wash your hands. °· Stay  home unless you must get medical care. Call your health care provider or local health authority before you get medical care. °· Avoid public areas. Do not take public transportation, if possible. °· If you can, wear a mask if you must go out of the house or if you are in close contact with someone who is not sick. °Keep your home clean °· Disinfect objects and surfaces that are frequently touched every day. This may include: °? Counters and tables. °? Doorknobs and light switches. °? Sinks and faucets. °? Electronics such as phones, remote controls, keyboards, computers, and tablets. °· Wash dishes in hot, soapy water or use a dishwasher. Air-dry your dishes. °· Wash laundry in hot water. °Prevent infecting other household members °· Let healthy household members care for children and pets, if possible. If you have to care for children or pets, wash your hands often and wear a mask. °· Sleep in a different bedroom or bed, if possible. °· Do not share personal items, such as razors, toothbrushes, deodorant, combs, brushes, towels, and washcloths. °Where to find more information °Centers for Disease Control and Prevention (CDC) °· Information and news updates: www.cdc.gov/coronavirus/2019-ncov °World Health Organization (WHO) °· Information and news updates: www.who.int/emergencies/diseases/novel-coronavirus-2019 °· Coronavirus health topic: www.who.int/health-topics/coronavirus °· Questions and answers on COVID-19: www.who.int/news-room/q-a-detail/q-a-coronaviruses °· Global tracker: who.sprinklr.com °American Academy of Pediatrics (AAP) °· Information for families: www.healthychildren.org/English/health-issues/conditions/chest-lungs/Pages/2019-Novel-Coronavirus.aspx °The coronavirus situation is changing rapidly. Check your local health authority website or the CDC and WHO websites for updates and news. °When should I contact a health care provider? °· Contact your health care provider if you have symptoms of an  infection, such as fever or cough, and you: °? Have been near anyone who is known to have coronavirus disease. °? Have come into contact with a person who is suspected to have coronavirus disease. °? Have traveled outside of the country. °When should I get emergency medical care? °· Get help right away by calling your local emergency services (911 in the [place] if you have: °? Trouble breathing. °? Pain or pressure in your chest. °? Confusion. °? Blue-tinged lips and fingernails. °? Difficulty waking from sleep. °? Symptoms that get worse. °Let the emergency medical personnel know if you think you have coronavirus disease. °Summary °· A new respiratory virus is spreading from person to person and causing COVID-19 (coronavirus disease). °· The virus that causes COVID-19 appears to spread easily. It spreads from one person to another through droplets from coughing or sneezing. °· Older adults and those with chronic diseases are at higher risk of disease. If you are at higher risk for complications, take extra precautions. °· There is currently no vaccine to prevent coronavirus disease. There are no medicines, such as antibiotics or antivirals, to treat the virus. °· You can protect yourself and your family by washing your hands often, avoiding touching your face, and covering your coughs and sneezes. °This information is not intended to replace advice given to you by your health care provider. Make sure you discuss any questions you have with your health care provider. °Document Released: 09/16/2018 Document Revised: 09/16/2018 Document Reviewed: 09/16/2018 °Elsevier Patient Education © 2020 [employer] ° °

## 2018-12-09 NOTE — Progress Notes (Addendum)
Occupational Therapy Treatment Patient Details Name: Shannon Meyer MRN: [id number] DOB: 08/10/1965 Today'Shannon Bradfords Date: 12/09/2018    History of present illness Pt adm 5/20 with hypoxic resp failure due to Covid 19. Pt intubated 5/20 and extubated 6/8, with re-intubation 6/8; Trach 6/11. PMH - none. 6/17 foul smelling trach site/secretions, elevated WBC and temp; vomited tube feeds. Decannulated 12/08/2018.   OT comments  Pt progressing towards established OT goals. Continues to present with poor balance, awareness, problem solving, and safety. [name] Min A +2 and Max cues for safety and sequencing. Challenging awareness and problem solving to have pt discuss ways to make toileting safer and pt requiring Max cues for anticipating problem areas and ways to increase safety. Pt doffing shoes at EOB with Min Guard A and Max cues for problem solving and safety.   Called and educated pt's sister on cognitive deficits, required physical support for ADLs and mobility, and assistance for all IADLs. PT's sister verbalize understanding opf required support.   During session, pt monitored on RA with pulse ox placed on finger, ear, and forehead: Forehead 100% on RA; ear 92% on RA; and finger fluctuating in 70s on RA.    Follow Up Recommendations  Home health OT;Supervision/Assistance - 24 hour(Continue to feel pt would benefit highly from CIR)    Equipment Recommendations  3 in 1 bedside commode;Wheelchair (measurements OT);Wheelchair cushion (measurements OT);Hospital bed;Other (comment)(RW)    Recommendations for Other Services Rehab consult    Precautions / Restrictions Precautions Precautions: Fall Precaution Comments: monitor vitals, on forehead       Mobility Bed Mobility Overal bed mobility: Needs Assistance Bed Mobility: Supine to Sit;Sit to Supine;Rolling Rolling: Supervision   Supine to sit: Supervision Sit to supine: Supervision   General bed mobility  comments: Pt performing with supervision  Transfers Overall transfer level: Needs assistance Equipment used: Rolling walker (2 wheeled) Transfers: Sit to/from Stand Sit to Stand: Min assist;+2 safety/equipment Stand pivot transfers: Min assist;+2 safety/equipment;+2 physical assistance       General transfer comment: Pt requiring Min A for maintaining balance and power up into standing. Requiring increased assistance during pivot due to poor safety and impulsivity.    Balance Overall balance assessment: Needs assistance Sitting-balance support: No upper extremity supported Sitting balance-[name] Scale: Fair     Standing balance support: During functional activity;Single extremity supported Standing balance-[name] Scale: Poor Standing balance comment: needs at least 1 UE to support during ADL's, tends to lean backwards, especially when first standing up                           ADL either performed or assessed with clinical judgement   ADL Overall ADL's : Needs assistance/impaired                     Lower Body Dressing: Min guard;Sit to/from stand;Maximal assistance Lower Body Dressing Details (indicate cue type and reason): Pt doffing socks with Max encouragement and cues for sequencing with pt repeating "I can't do that". Pt then requiring increased cues to don shoes and stating [name] so donned shoes for her. Once back at EOB, pt becoming frustrated with having to doff shoes saying she can't do it. Pt able to dof shoes with Max cues for sequencing and using method to bring ankle to EOB   Toilet Transfer Details (indicate cue type and reason): Min A for balance and  safety. Discussed with pt ways to make transfers more safe such as 1. taking her time, 2. making sure she has her balance in standing before walking, 3. using walker at all times. Toileting- Clothing Manipulation and Hygiene: Minimal assistance;Sit to/from stand Toileting -  Clothing Manipulation Details (indicate cue type and reason): Pt performing toileting hygiene twice during session. FIrst attempt, pt impulsive and presenting with decreased safety awareness and problem solving. Pt with Lob during first attempt requiring Min A to correct. Discussed ways to make toielt safer such as gathering clothing first, make sure she has her balance, and holding walker with one hand. Pt performing toielt hygiene a second time and performign with Min Guard A and cues to recall safe tips     Functional mobility during ADLs: Minimal assistance;Rolling walker;+2 for safety/equipment(stand pivot only) General ADL Comments: Pt continues to present with poor awarness, attention, and balance     Vision       Perception     Praxis      Cognition Arousal/Alertness: Awake/alert Behavior During Therapy: Impulsive Overall Cognitive Status: Impaired/Different from baseline Area of Impairment: Attention;Memory;Safety/judgement;Awareness;Problem solving                   Current Attention Level: Selective Memory: Decreased recall of precautions;Decreased short-term memory Following Commands: Follows one step commands consistently Safety/Judgement: Decreased awareness of safety;Decreased awareness of deficits Awareness: Emergent Problem Solving: Slow processing General Comments: Pt continues to present with impulsivity, poor problem solving, and decreased awareness. Pt requiring cues to problem solving and discuss how to perform BADLs safety. Pt performing toileting and having LOB; discuss afterwards how to make toileting more safe.         Exercises     Shoulder Instructions       General Comments SpO2 with different results depending on pulse ox placement on RA. on her forehead, SpO2 100%; on her ear 90s; on her finger with monitor 80s; and using home pulse ox with finger 77%    Pertinent Vitals/ Pain       Pain Assessment: No/denies pain Pain Score: 0-No  pain  Home Living                                          Prior Functioning/Environment              Frequency  Min 3X/week        Progress Toward Goals  OT Goals(current goals can now be found in the care plan section)  Progress towards OT goals: Progressing toward goals  Acute Rehab OT Goals Patient Stated Goal: to go home and be with her family OT Goal Formulation: With patient/family Time For Goal Achievement: 12/15/18 Potential to Achieve Goals: Good ADL Goals Pt Will Perform Eating: with modified independence Pt Will Perform Grooming: with min assist;sitting Pt Will Perform Upper Body Bathing: with set-up;sitting Pt Will Transfer to Toilet: stand pivot transfer;bedside commode;with mod assist Pt Will Perform Toileting - Clothing Manipulation and hygiene: with mod assist;sit to/from stand;sitting/lateral leans Additional ADL Goal #1: Pt will perform bed mobility with Min A in preparation for ADLs Additional ADL Goal #2: Pt will tolerate sitting at EOB for 10 minutes with Min Guard A in preparation for ADLs Additional ADL Goal #3: Pt will demonstrate selective attention during ADL in a distracting environement with Min cues  Plan Discharge plan remains appropriate  Co-evaluation    PT/OT/SLP Co-Evaluation/Treatment: Yes Reason for Co-Treatment: Complexity of the patient's impairments (multi-system involvement);For patient/therapist safety;To address functional/ADL transfers PT goals addressed during session: Mobility/safety with mobility OT goals addressed during session: ADL's and self-care      AM-PAC OT "6 Clicks" Daily Activity     Outcome Measure   Help from another person eating meals?: A Little Help from another person taking care of personal grooming?: A Little Help from another person toileting, which includes using toliet, bedpan, or urinal?: A Lot Help from another person bathing (including washing, rinsing, drying)?: A  Little Help from another person to put on and taking off regular upper body clothing?: A Lot Help from another person to put on and taking off regular lower body clothing?: A Lot 6 Click Score: 15    End of Session Equipment Utilized During Treatment: Gait belt;Rolling walker  OT Visit Diagnosis: Unsteadiness on feet (R26.81);Other abnormalities of gait and mobility (R26.89);Muscle weakness (generalized) (M62.81);Other symptoms and signs involving cognitive function   Activity Tolerance Patient tolerated treatment well   Patient Left in bed;with call bell/phone within reach;with bed alarm set(with RT)   Nurse Communication Mobility status;Other (comment)(Vitals)        Time: 1010-1106 OT Time Calculation (min): 56 min  Charges: OT General Charges $OT Visit: 1 Visit OT Treatments $Self Care/Home Management : 23-37 mins  [place], OTR/L Acute Rehab Pager: [phone] Office: [place] 12/09/2018, 2:33 PM

## 2018-12-09 NOTE — Discharge Summary (Signed)
PATIENT DETAILS Name: Shannon Meyer Age: 53 y.o. Sex: female Date of Birth: 02-27-1966 MRN: [id number]. Admitting Physician: Leroy Sea, MD PCP:No primary care provider on file.  Admit Date: 10/22/2018 Discharge date: 12/09/2018  Recommendations for Outpatient Follow-up:  1. Follow up with PCP in 1-2 weeks 2. Please obtain BMP/CBC in one week 3. Ensure follow-up with cardiology  Admitted From:  Home  Disposition: Home with home health services   Home Health: Yes  Equipment/Devices: None  Discharge Condition: Stable  CODE STATUS: FULL CODE  Diet recommendation:  Diet recommendations: Dysphagia 3 (mechanical soft);Thin liquid Liquids provided via: Cup;Straw Medication Administration: Whole meds with puree Supervision: Patient able to self feed;Intermittent supervision to cue for compensatory strategies Compensations: Slow rate;Small sips/bites;Follow solids with liquid;Multiple dry swallows after each bite/sip Postural Changes and/or Swallow Maneuvers: Seated upright 90 degrees  Brief Summary: See H&P, Labs, Consult and Test reports for all details in brief, Patient is a 53 y.o. female with no significant past medical history-presented with shortness of breath-found to have acute hypoxic respiratory failure secondary to COVID-19 pneumonia-intubated in the emergency room and admitted to [facility].  She had a prolonged hospital course-she was difficult to wean off the ventilator and underwent tracheostomy.  She also had pseudomonal pneumonia and was treated with antimicrobial therapy.  She continues to slowly improve-with decreasing FiO2 requirements, PCCM following for trach care.  Brief Hospital Course: Acute Hypoxic Resp Failure due to Covid 19 Viral pneumonia:  Prolonged hospital course requiring prolonged ventilation and subsequent tracheostomy.  Treated with steroids, Remdesivir, and Actemra.  Over the past few days with stable oxygen  requirements-clinically improving-PCCM followed closely and patient underwent decannulation of tracheostomy on 7/6.   COVID-19 Medications: Remdesivir>> 5/21 Actemra>> 5/20 and 5/21  Pseudomonas pneumonia: Improved-completed Fortaz-afebrile and not short of breath.  Acute encephalopathy/delirium: Resolved-completely awake and alert.    Brief run of PAF on 6/24: Remains in sinus rhythm-prior MD-Dr. Thedore Mins discussed with Dr. Jory Sims aspirin and metoprolol-recommendations are for outpatient echocardiogram and outpatient cardiology follow-up.  Deconditioning/debility: PT following-recommendations were for CIR-but patient very anxious to go home-hence being discharged with maximum home health services.  PT followed up on 7/6 with recommendations for home health as well  Tongue laceration: Resolved-seems to have healed well-doubt any further follow-up required.  Suspected to be injured during intubation.  Obesity  Procedures/Studies: ETT>> 5/20-6/11 Tracheostomy>> 6/11 Right arm PICC line>> 6/7  Discharge Diagnoses:  Principal Problem:   COVID-19 Active Problems:   Leukocytosis   Sepsis (HCC)   Elevated LFTs   QT prolongation   Acute respiratory failure with hypoxemia (HCC)   Pressure injury of skin   Tracheostomy status (HCC)   ARDS (adult respiratory distress syndrome) (HCC)   Tracheostomy care [facility])   Discharge Instructions:  Activity:  As tolerated with Full fall precautions use walker/cane & assistance as needed Discharge Instructions    Call MD for:  difficulty breathing, headache or visual disturbances   Complete by: As directed    Call MD for:  extreme fatigue   Complete by: As directed    Call MD for:  persistant dizziness or light-headedness   Complete by: As directed    Call MD for:  persistant nausea and vomiting   Complete by: As directed    Call MD for:  temperature >100.4   Complete by: As directed    Diet - low sodium heart healthy    Complete by: As directed    Diet recommendations: Dysphagia 3 (mechanical  soft);Thin liquid Liquids provided via: Cup;Straw Medication Administration: Whole meds with puree Supervision: Patient able to self feed;Intermittent supervision to cue for compensatory strategies Compensations: Slow rate;Small sips/bites;Follow solids with liquid;Multiple dry swallows after each bite/sip Postural Changes and/or Swallow Maneuvers: Seated upright 90 degrees   Discharge instructions   Complete by: As directed    Follow with Primary MD in 1 week  Follow with cardiology as instructed  Please get a complete blood count and chemistry panel checked by your Primary MD at your next visit, and again as instructed by your Primary MD.  Get Medicines reviewed and adjusted: Please take all your medications with you for your next visit with your Primary MD  Laboratory/radiological data: Please request your Primary MD to go over all hospital tests and procedure/radiological results at the follow up, please ask your Primary MD to get all Hospital records sent to his/her office.  In some cases, they will be blood work, cultures and biopsy results pending at the time of your discharge. Please request that your primary care M.D. follows up on these results.  Also Note the following: If you experience worsening of your admission symptoms, develop shortness of breath, life threatening emergency, suicidal or homicidal thoughts you must seek medical attention immediately by calling 911 or calling your MD immediately  if symptoms less severe.  You must read complete instructions/literature along with all the possible adverse reactions/side effects for all the Medicines you take and that have been prescribed to you. Take any new Medicines after you have completely understood and accpet all the possible adverse reactions/side effects.   Do not drive when taking Pain medications or sleeping medications  (Benzodaizepines)  Do not take more than prescribed Pain, Sleep and Anxiety Medications. It is not advisable to combine anxiety,sleep and pain medications without talking with your primary care practitioner  Special Instructions: If you have smoked or chewed Tobacco  in the last 2 yrs please stop smoking, stop any regular Alcohol  and or any Recreational drug use.  Wear Seat belts while driving.  Please note: You were cared for by a hospitalist during your hospital stay. Once you are discharged, your primary care physician will handle any further medical issues. Please note that NO REFILLS for any discharge medications will be authorized once you are discharged, as it is imperative that you return to your primary care physician (or establish a relationship with a primary care physician if you do not have one) for your post hospital discharge needs so that they can reassess your need for medications and monitor your lab values.   Increase activity slowly   Complete by: As directed      Allergies as of 12/09/2018   No Known Allergies     Medication List    TAKE these medications   acetaminophen 325 MG tablet Commonly known as: TYLENOL Take 650 mg by mouth every 6 (six) hours as needed for mild pain, moderate pain or fever.   aspirin 81 MG EC tablet Take 1 tablet (81 mg total) by mouth daily.   famotidine 20 MG tablet Commonly known as: PEPCID Take 1 tablet (20 mg total) by mouth at bedtime.   feeding supplement (ENSURE ENLIVE) Liqd Take 237 mLs by mouth 3 (three) times daily between meals.   metoprolol tartrate 50 MG tablet Commonly known as: LOPRESSOR Take 1 tablet (50 mg total) by mouth 2 (two) times daily.            Durable Medical Equipment  (  From admission, onward)         Start     Ordered   12/09/18 0727  For home use only DME Hospital bed  Once    Question Answer Comment  Length of Need 6 Months   The above medical condition requires: Patient requires the  ability to reposition frequently   Head must be elevated greater than: 45 degrees   Bed type Semi-electric      12/09/18 0726   12/09/18 0726  For home use only DME standard manual wheelchair with seat cushion  Once    Comments: Patient suffers from severe physical deconditioning secondary to ARDS from COVID 19 which impairs their ability to perform daily activities like bathing, dressing, feeding, grooming and toileting in the home.  A cane, crutch or walker will not resolve issue with performing activities of daily living. A wheelchair will allow patient to safely perform daily activities. Patient can safely propel the wheelchair in the home or has a caregiver who can provide assistance. Length of need 6 months . Accessories: elevating leg rests (ELRs), wheel locks, extensions and anti-tippers.   12/09/18 0726   12/08/18 1029  For home use only DME Bedside commode  Once    Question:  Patient needs a bedside commode to treat with the following condition  Answer:  Physical deconditioning   12/08/18 1028   12/08/18 1029  For home use only DME Walker rolling  Once    Question:  Patient needs a walker to treat with the following condition  Answer:  Physical deconditioning   12/08/18 1028         Follow-up Information    Leone BrandIngold, Laura R, NP Follow up on 12/25/2018.   Specialties: Cardiology, Radiology Why: Please arrive 15 minutes early for your post-hospital cardiology appointment on 12/25/2018 at 3:00pm with Nada BoozerLaura Ingold, NP Contact information: [address] [place] [address] [phone]        [facility] Follow up.   Why: Hospital bed, Rolling walker and 3in1  will be delivered to your home by [facility] Contact information: [address] [place] [address] [phone]        [facility] Follow up.   Why: A representative from [facility] will contct you to arrange start date and time for  your therapy. Contact information: [address] [place] [address] [phone]          No Known Allergies   Consultations:   pulmonary/intensive care  Other Procedures/Studies: Dg Abd 1 View  Result Date: 11/10/2018 CLINICAL DATA:  Orogastric placement EXAM: ABDOMEN - 1 VIEW COMPARISON:  None. FINDINGS: Orogastric tube enters the stomach, loops in the body and has its tip in the antrum. Bowel gas pattern appears normal. IMPRESSION: Orogastric tip in the antrum. Electronically Signed   By: Paulina FusiMark  Shogry M.D.   On: 11/10/2018 16:50   Dg Chest Port 1 View  Result Date: 11/25/2018 CLINICAL DATA:  ARDS, COVID-19 positive EXAM: PORTABLE CHEST 1 VIEW COMPARISON:  Portable exam 0508 hours compared to 11/19/2018 FINDINGS: Tracheostomy tube unchanged. Tip of RIGHT arm PICC line projects over cavoatrial junction. Feeding tube extends to at least the second portion of the duodenum. Stable heart size. Diffuse BILATERAL airspace infiltrates, unchanged. No pleural effusion or pneumothorax. IMPRESSION: Persistent diffuse BILATERAL airspace infiltrates Electronically Signed   By: Ulyses SouthwardMark  Boles M.D.   On: 11/25/2018 08:08   [facility]  Result  Date: 11/19/2018 CLINICAL DATA:  Fever EXAM: PORTABLE CHEST 1 VIEW COMPARISON:  11/13/2018, 11/11/2018 FINDINGS: Tracheostomy tube in satisfactory position. Feeding tube coiled in the stomach. Right-sided PICC line with the tip projecting over the SVC. Worsening bilateral patchy interstitial and alveolar airspace disease throughout bilateral lungs concerning for multilobar pneumonia. No pleural effusion or pneumothorax. Stable cardiomediastinal silhouette. No aggressive osseous lesion. IMPRESSION: 1. Support lines and tubing in satisfactory position. 2. Worsening bilateral patchy interstitial and alveolar airspace disease throughout bilateral lungs concerning for multilobar pneumonia. Electronically Signed   By: Elige Ko   On: 11/19/2018 13:38    Dg Chest Port 1 View  Result Date: 11/13/2018 CLINICAL DATA:  Tracheostomy placement.  COVID-19. EXAM: PORTABLE CHEST 1 VIEW COMPARISON:  11/11/2018. FINDINGS: Tracheostomy is been inserted. Tip lies 4.5 cm above carina. BILATERAL pulmonary opacities persist, LEFT greater than RIGHT, not significantly improved. Cardiomegaly. Feeding tube duodenum. IMPRESSION: Satisfactory tracheostomy placement. No significant improvement aeration. Electronically Signed   By: Elsie Stain M.D.   On: 11/13/2018 16:16   Dg Chest Port 1 View  Result Date: 11/11/2018 CLINICAL DATA:  Follow-up aspiration EXAM: PORTABLE CHEST 1 VIEW COMPARISON:  11/10/2018 FINDINGS: Cardiac shadow is stable. Endotracheal tube and gastric catheter are noted in satisfactory position. Right-sided PICC line is noted at the cavoatrial junction. Patchy infiltrates are again seen bilaterally stable from the prior exam. No pneumothorax is noted. IMPRESSION: No change from the previous day. Electronically Signed   By: Alcide Clever M.D.   On: 11/11/2018 07:52   Dg Chest Port 1 View  Result Date: 11/10/2018 CLINICAL DATA:  Intubation.  OG tube placement. EXAM: PORTABLE CHEST 1 VIEW COMPARISON:  November 09, 2018 FINDINGS: The ETT remains in good position, 2 cm above the carina. Bilateral pulmonary infiltrates remain, similar on the left and more prominent on the right in the interval. The OG tube terminates below today's film. No other changes. IMPRESSION: Support apparatus as above. Bilateral pulmonary infiltrates are stable on the left and mildly more prominent on the right in the interval. Electronically Signed   By: Gerome Sam III M.D   On: 11/10/2018 16:27   Dg Abd Portable 1v  Result Date: 11/29/2018 CLINICAL DATA:  Dysphagia EXAM: PORTABLE ABDOMEN - 1 VIEW COMPARISON:  Plain film of the abdomen dated 11/21/2018. FINDINGS: Enteric tube appears adequately positioned in the stomach. Visualized bowel gas pattern is nonobstructive. No evidence of  free intraperitoneal air. IMPRESSION: Enteric tube appears adequately positioned in the stomach. Electronically Signed   By: Bary Richard M.D.   On: 11/29/2018 11:29   Dg Abd Portable 1v  Result Date: 11/21/2018 CLINICAL DATA:  NG tube placement. EXAM: PORTABLE ABDOMEN - 1 VIEW COMPARISON:  Plain films of the abdomen dated 11/20/2018 and 11/19/2018. FINDINGS: Weighted tip feeding tube now appears appropriately positioned with tip in the proximal duodenum. Visualized bowel gas pattern is nonobstructive. IMPRESSION: Weighted tip feeding tube now appears appropriately positioned with tip in the proximal duodenum. Electronically Signed   By: Bary Richard M.D.   On: 11/21/2018 12:24   Dg Abd Portable 1v  Result Date: 11/20/2018 CLINICAL DATA:  Encounter for NG tube placement EXAM: PORTABLE ABDOMEN - 1 VIEW COMPARISON:  11/19/2018 FINDINGS: Feeding tube coils in the stomach with the tip in the fundus. Nonobstructive bowel gas pattern. No organomegaly or free air. IMPRESSION: Feeding tube coils in the stomach with the tip in the fundus. Electronically Signed   By: Charlett Nose M.D.   On: 11/20/2018 19:57  Dg Abd Portable 1v  Result Date: 11/19/2018 CLINICAL DATA:  Vomiting. EXAM: PORTABLE ABDOMEN - 1 VIEW COMPARISON:  None. FINDINGS: A feeding tube coils in the stomach with the distal tip in the fundus. No evidence of bowel obstruction. IMPRESSION: A feeding tube coils in the stomach. No evidence of bowel obstruction. No cause for vomiting noted. Electronically Signed   By: Gerome Sam III M.D   On: 11/19/2018 16:43   Dg Swallowing Func-speech Pathology  Result Date: 12/01/2018 Objective Swallowing Evaluation: Type of Study: MBS-Modified Barium Swallow Study  Patient Details Name: Shannon Meyer MRN: [id number] Date of Birth: 1965/06/12 Today's Date: 12/01/2018 Time: SLP Start Time (ACUTE ONLY): 1050 -SLP Stop Time (ACUTE ONLY): 1122 SLP Time Calculation (min) (ACUTE ONLY): 32 min Past Medical History: No  past medical history on file. Past Surgical History: No past surgical history on file. HPI: Pt adm 5/20 with hypoxic resp failure due to Covid 19. Pt intubated 5/20 and extubated 6/8, with re-intubation 6/8; Trach 6/11. PMH - none.  Subjective: alert, distractible Assessment / Plan / Recommendation CHL IP CLINICAL IMPRESSIONS 12/01/2018 Clinical Impression Pt demonstrates moderate oropharyngeal dysphagia following prolonged critical illness and intubation. 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 PMSV in place throught, pt on 2 liters [place]. Pt has relatively good oral function despite lingual wound that is healing well. LIngual manipulation for mastication, bolus formation and propulsion appears a little slow but adequate. However, premature spillage and delayed swallow initiation do lead to prolonged pooling of liquids in pyriform sinsues prior to swallow initiation. WIth thin liquids penetration and aspiration via the postirior commisure is failry consistent, no response from pt but a cued cough ejects. Chin tuck not beneficial. Nectar thick liquids and solids were not aspirated but there was mild to moderate diffuse pharyngeal residual suggesting mild generalized muscle weakness. Pt able to follow cue to swallow twice. Recommend a dys 3 mech soft) diet with nectar thick liquids and intermittent instruction to swallow again. Will follow for tolerance.  SLP Visit Diagnosis Dysphagia, oropharyngeal phase (R13.12) Attention and concentration deficit following -- Frontal lobe and executive function deficit following -- Impact on safety and function Moderate aspiration risk   CHL IP TREATMENT RECOMMENDATION 12/01/2018 Treatment Recommendations Therapy as outlined in treatment plan below   Prognosis 12/01/2018 Prognosis for Safe Diet Advancement Good Barriers to Reach Goals Cognitive deficits Barriers/Prognosis Comment -- CHL IP DIET RECOMMENDATION 12/01/2018  SLP Diet Recommendations Dysphagia 3 (Mech soft) solids;Nectar thick liquid Liquid Administration via Cup;Straw Medication Administration Whole meds with liquid Compensations Slow rate;Small sips/bites;Follow solids with liquid;Multiple dry swallows after each bite/sip Postural Changes Seated upright at 90 degrees   CHL IP OTHER RECOMMENDATIONS 12/01/2018 Recommended Consults -- Oral Care Recommendations Oral care BID Other Recommendations Order thickener from pharmacy   CHL IP FOLLOW UP RECOMMENDATIONS 12/01/2018 Follow up Recommendations Inpatient Rehab   CHL IP FREQUENCY AND DURATION 12/01/2018 Speech Therapy Frequency (ACUTE ONLY) min 2x/week Treatment Duration 2 weeks      CHL IP ORAL PHASE 12/01/2018 Oral Phase WFL Oral - Pudding Teaspoon -- Oral - Pudding Cup -- Oral - Honey Teaspoon -- Oral - Honey Cup -- Oral - Nectar Teaspoon -- Oral - Nectar Cup -- Oral - Nectar Straw -- Oral - Thin Teaspoon -- Oral - Thin Cup -- Oral - Thin Straw -- Oral - Puree -- Oral - Mech Soft -- Oral - Regular -- Oral - Multi-Consistency -- Oral - Pill -- Oral Phase -  Comment --  CHL IP PHARYNGEAL PHASE 12/01/2018 Pharyngeal Phase Impaired Pharyngeal- Pudding Teaspoon -- Pharyngeal -- Pharyngeal- Pudding Cup -- Pharyngeal -- Pharyngeal- Honey Teaspoon -- Pharyngeal -- Pharyngeal- Honey Cup -- Pharyngeal -- Pharyngeal- Nectar Teaspoon -- Pharyngeal -- Pharyngeal- Nectar Cup Delayed swallow initiation-pyriform sinuses;Reduced tongue base retraction;Reduced laryngeal elevation;Reduced anterior laryngeal mobility;Reduced epiglottic inversion;Reduced pharyngeal peristalsis;Pharyngeal residue - valleculae;Pharyngeal residue - pyriform Pharyngeal -- Pharyngeal- Nectar Straw Delayed swallow initiation-pyriform sinuses;Reduced tongue base retraction;Reduced laryngeal elevation;Reduced anterior laryngeal mobility;Reduced epiglottic inversion;Reduced pharyngeal peristalsis;Pharyngeal residue - valleculae;Pharyngeal residue - pyriform Pharyngeal --  Pharyngeal- Thin Teaspoon -- Pharyngeal -- Pharyngeal- Thin Cup Delayed swallow initiation-pyriform sinuses;Reduced airway/laryngeal closure;Penetration/Aspiration during swallow;Trace aspiration;Pharyngeal residue - pyriform Pharyngeal Material enters airway, CONTACTS cords and not ejected out;Material enters airway, passes BELOW cords without attempt by patient to eject out (silent aspiration) Pharyngeal- Thin Straw Delayed swallow initiation-pyriform sinuses;Reduced airway/laryngeal closure;Penetration/Aspiration during swallow;Trace aspiration;Pharyngeal residue - pyriform;Compensatory strategies attempted (with notebox) Pharyngeal Material enters airway, passes BELOW cords without attempt by patient to eject out (silent aspiration);Material enters airway, CONTACTS cords and not ejected out Pharyngeal- Puree Delayed swallow initiation-pyriform sinuses;Reduced tongue base retraction;Reduced laryngeal elevation;Reduced anterior laryngeal mobility;Reduced epiglottic inversion;Reduced pharyngeal peristalsis;Pharyngeal residue - valleculae;Pharyngeal residue - pyriform Pharyngeal -- Pharyngeal- Mechanical Soft -- Pharyngeal -- Pharyngeal- Regular Delayed swallow initiation-pyriform sinuses;Reduced tongue base retraction;Reduced laryngeal elevation;Reduced anterior laryngeal mobility;Reduced epiglottic inversion;Reduced pharyngeal peristalsis;Pharyngeal residue - valleculae;Pharyngeal residue - pyriform Pharyngeal -- Pharyngeal- Multi-consistency -- Pharyngeal -- Pharyngeal- Pill -- Pharyngeal -- Pharyngeal Comment --  No flowsheet data found. De[place], Riley Nearing 12/01/2018, 12:02 PM              [place] Ekg Site Rite  Result Date: 11/09/2018 If [facility] image not attached, placement could not be confirmed due to current cardiac rhythm.    TODAY-DAY OF DISCHARGE:  Subjective:   [place] today has no headache,no chest abdominal pain,no new weakness tingling or numbness, feels much better wants to go  home today.  Objective:   Blood pressure (!) 111/59, pulse 92, temperature 97.7 F (36.5 C), temperature source Oral, resp. rate (!) 30, height 5\' 4"  (1.626 m), weight 89.5 kg, SpO2 91 %.  Intake/Output Summary (Last 24 hours) at 12/09/2018 0851 Last data filed at 12/09/2018 0600 Gross per 24 hour  Intake --  Output 500 ml  Net -500 ml   Filed Weights   12/04/18 0500 12/05/18 0500 12/06/18 0514  Weight: 85.2 kg 89.3 kg 89.5 kg    Exam: Awake Alert, Oriented *3, No new F.N deficits, Normal affect [place].AT,PERRAL Supple Neck,No JVD, No cervical lymphadenopathy appriciated.  Symmetrical Chest wall movement, Good air movement bilaterally, CTAB RRR,No Gallops,Rubs or new Murmurs, No Parasternal Heave +ve B.Sounds, Abd Soft, Non tender, No organomegaly appriciated, No rebound -guarding or rigidity. No Cyanosis, Clubbing or edema, No new Rash or bruise   PERTINENT RADIOLOGIC STUDIES: Dg Abd 1 View  Result Date: 11/10/2018 CLINICAL DATA:  Orogastric placement EXAM: ABDOMEN - 1 VIEW COMPARISON:  None. FINDINGS: Orogastric tube enters the stomach, loops in the body and has its tip in the antrum. Bowel gas pattern appears normal. IMPRESSION: Orogastric tip in the antrum. Electronically Signed   By: Paulina Fusi M.D.   On: 11/10/2018 16:50   Dg Chest Port 1 View  Result Date: 11/25/2018 CLINICAL DATA:  ARDS, COVID-19 positive EXAM: PORTABLE CHEST 1 VIEW COMPARISON:  Portable exam 0508 hours compared to 11/19/2018 FINDINGS: Tracheostomy tube unchanged. Tip of RIGHT arm PICC line projects over cavoatrial junction. Feeding tube extends to  at least the second portion of the duodenum. Stable heart size. Diffuse BILATERAL airspace infiltrates, unchanged. No pleural effusion or pneumothorax. IMPRESSION: Persistent diffuse BILATERAL airspace infiltrates Electronically Signed   By: Ulyses SouthwardMark  Boles M.D.   On: 11/25/2018 08:08   Dg Chest Port 1 View  Result Date: 11/19/2018 CLINICAL DATA:  Fever EXAM: PORTABLE  CHEST 1 VIEW COMPARISON:  11/13/2018, 11/11/2018 FINDINGS: Tracheostomy tube in satisfactory position. Feeding tube coiled in the stomach. Right-sided PICC line with the tip projecting over the SVC. Worsening bilateral patchy interstitial and alveolar airspace disease throughout bilateral lungs concerning for multilobar pneumonia. No pleural effusion or pneumothorax. Stable cardiomediastinal silhouette. No aggressive osseous lesion. IMPRESSION: 1. Support lines and tubing in satisfactory position. 2. Worsening bilateral patchy interstitial and alveolar airspace disease throughout bilateral lungs concerning for multilobar pneumonia. Electronically Signed   By: Elige KoHetal  Patel   On: 11/19/2018 13:38   Dg Chest Port 1 View  Result Date: 11/13/2018 CLINICAL DATA:  Tracheostomy placement.  COVID-19. EXAM: PORTABLE CHEST 1 VIEW COMPARISON:  11/11/2018. FINDINGS: Tracheostomy is been inserted. Tip lies 4.5 cm above carina. BILATERAL pulmonary opacities persist, LEFT greater than RIGHT, not significantly improved. Cardiomegaly. Feeding tube duodenum. IMPRESSION: Satisfactory tracheostomy placement. No significant improvement aeration. Electronically Signed   By: Elsie StainJohn T Curnes M.D.   On: 11/13/2018 16:16   Dg Chest Port 1 View  Result Date: 11/11/2018 CLINICAL DATA:  Follow-up aspiration EXAM: PORTABLE CHEST 1 VIEW COMPARISON:  11/10/2018 FINDINGS: Cardiac shadow is stable. Endotracheal tube and gastric catheter are noted in satisfactory position. Right-sided PICC line is noted at the cavoatrial junction. Patchy infiltrates are again seen bilaterally stable from the prior exam. No pneumothorax is noted. IMPRESSION: No change from the previous day. Electronically Signed   By: Alcide CleverMark  Lukens M.D.   On: 11/11/2018 07:52   Dg Chest Port 1 View  Result Date: 11/10/2018 CLINICAL DATA:  Intubation.  OG tube placement. EXAM: PORTABLE CHEST 1 VIEW COMPARISON:  November 09, 2018 FINDINGS: The ETT remains in good position, 2 cm above  the carina. Bilateral pulmonary infiltrates remain, similar on the left and more prominent on the right in the interval. The OG tube terminates below today's film. No other changes. IMPRESSION: Support apparatus as above. Bilateral pulmonary infiltrates are stable on the left and mildly more prominent on the right in the interval. Electronically Signed   By: Gerome Samavid  Williams III M.D   On: 11/10/2018 16:27   Dg Abd Portable 1v  Result Date: 11/29/2018 CLINICAL DATA:  Dysphagia EXAM: PORTABLE ABDOMEN - 1 VIEW COMPARISON:  Plain film of the abdomen dated 11/21/2018. FINDINGS: Enteric tube appears adequately positioned in the stomach. Visualized bowel gas pattern is nonobstructive. No evidence of free intraperitoneal air. IMPRESSION: Enteric tube appears adequately positioned in the stomach. Electronically Signed   By: Bary RichardStan  Maynard M.D.   On: 11/29/2018 11:29   Dg Abd Portable 1v  Result Date: 11/21/2018 CLINICAL DATA:  NG tube placement. EXAM: PORTABLE ABDOMEN - 1 VIEW COMPARISON:  Plain films of the abdomen dated 11/20/2018 and 11/19/2018. FINDINGS: Weighted tip feeding tube now appears appropriately positioned with tip in the proximal duodenum. Visualized bowel gas pattern is nonobstructive. IMPRESSION: Weighted tip feeding tube now appears appropriately positioned with tip in the proximal duodenum. Electronically Signed   By: Bary RichardStan  Maynard M.D.   On: 11/21/2018 12:24   Dg Abd Portable 1v  Result Date: 11/20/2018 CLINICAL DATA:  Encounter for NG tube placement EXAM: PORTABLE ABDOMEN - 1 VIEW COMPARISON:  11/19/2018 FINDINGS: Feeding tube coils in the stomach with the tip in the fundus. Nonobstructive bowel gas pattern. No organomegaly or free air. IMPRESSION: Feeding tube coils in the stomach with the tip in the fundus. Electronically Signed   By: Charlett Nose M.D.   On: 11/20/2018 19:57   Dg Abd Portable 1v  Result Date: 11/19/2018 CLINICAL DATA:  Vomiting. EXAM: PORTABLE ABDOMEN - 1 VIEW COMPARISON:   None. FINDINGS: A feeding tube coils in the stomach with the distal tip in the fundus. No evidence of bowel obstruction. IMPRESSION: A feeding tube coils in the stomach. No evidence of bowel obstruction. No cause for vomiting noted. Electronically Signed   By: Gerome Sam III M.D   On: 11/19/2018 16:43   Dg Swallowing Func-speech Pathology  Result Date: 12/01/2018 Objective Swallowing Evaluation: Type of Study: MBS-Modified Barium Swallow Study  Patient Details Name: Shannon Meyer MRN: [id number] Date of Birth: [birth year] Today's Date: 12/01/2018 Time: SLP Start Time (ACUTE ONLY): 1050 -SLP Stop Time (ACUTE ONLY): 1122 SLP Time Calculation (min) (ACUTE ONLY): 32 min Past Medical History: No past medical history on file. Past Surgical History: No past surgical history on file. HPI: Pt adm 5/20 with hypoxic resp failure due to Covid 19. Pt intubated 5/20 and extubated 6/8, with re-intubation 6/8; Trach 6/11. PMH - none.  Subjective: alert, distractible Assessment / Plan / Recommendation CHL IP CLINICAL IMPRESSIONS 12/01/2018 Clinical Impression Pt demonstrates moderate oropharyngeal dysphagia following prolonged critical illness and intubation. 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 PMSV in place throught, pt on 2 liters [place]. Pt has relatively good oral function despite lingual wound that is healing well. LIngual manipulation for mastication, bolus formation and propulsion appears a little slow but adequate. However, premature spillage and delayed swallow initiation do lead to prolonged pooling of liquids in pyriform sinsues prior to swallow initiation. WIth thin liquids penetration and aspiration via the postirior commisure is failry consistent, no response from pt but a cued cough ejects. Chin tuck not beneficial. Nectar thick liquids and solids were not aspirated but there was mild to moderate diffuse pharyngeal residual suggesting mild  generalized muscle weakness. Pt able to follow cue to swallow twice. Recommend a dys 3 mech soft) diet with nectar thick liquids and intermittent instruction to swallow again. Will follow for tolerance.  SLP Visit Diagnosis Dysphagia, oropharyngeal phase (R13.12) Attention and concentration deficit following -- Frontal lobe and executive function deficit following -- Impact on safety and function Moderate aspiration risk   CHL IP TREATMENT RECOMMENDATION 12/01/2018 Treatment Recommendations Therapy as outlined in treatment plan below   Prognosis 12/01/2018 Prognosis for Safe Diet Advancement Good Barriers to Reach Goals Cognitive deficits Barriers/Prognosis Comment -- CHL IP DIET RECOMMENDATION 12/01/2018 SLP Diet Recommendations Dysphagia 3 (Mech soft) solids;Nectar thick liquid Liquid Administration via Cup;Straw Medication Administration Whole meds with liquid Compensations Slow rate;Small sips/bites;Follow solids with liquid;Multiple dry swallows after each bite/sip Postural Changes Seated upright at 90 degrees   CHL IP OTHER RECOMMENDATIONS 12/01/2018 Recommended Consults -- Oral Care Recommendations Oral care BID Other Recommendations Order thickener from pharmacy   CHL IP FOLLOW UP RECOMMENDATIONS 12/01/2018 Follow up Recommendations Inpatient Rehab   CHL IP FREQUENCY AND DURATION 12/01/2018 Speech Therapy Frequency (ACUTE ONLY) min 2x/week Treatment Duration 2 weeks      CHL IP ORAL PHASE 12/01/2018 Oral Phase WFL Oral - Pudding Teaspoon -- Oral - Pudding Cup -- Oral - Honey Teaspoon -- Oral - Honey Cup -- Oral -  Nectar Teaspoon -- Oral - Nectar Cup -- Oral - Nectar Straw -- Oral - Thin Teaspoon -- Oral - Thin Cup -- Oral - Thin Straw -- Oral - Puree -- Oral - Mech Soft -- Oral - Regular -- Oral - Multi-Consistency -- Oral - Pill -- Oral Phase - Comment --  CHL IP PHARYNGEAL PHASE 12/01/2018 Pharyngeal Phase Impaired Pharyngeal- Pudding Teaspoon -- Pharyngeal -- Pharyngeal- Pudding Cup -- Pharyngeal -- Pharyngeal-  Honey Teaspoon -- Pharyngeal -- Pharyngeal- Honey Cup -- Pharyngeal -- Pharyngeal- Nectar Teaspoon -- Pharyngeal -- Pharyngeal- Nectar Cup Delayed swallow initiation-pyriform sinuses;Reduced tongue base retraction;Reduced laryngeal elevation;Reduced anterior laryngeal mobility;Reduced epiglottic inversion;Reduced pharyngeal peristalsis;Pharyngeal residue - valleculae;Pharyngeal residue - pyriform Pharyngeal -- Pharyngeal- Nectar Straw Delayed swallow initiation-pyriform sinuses;Reduced tongue base retraction;Reduced laryngeal elevation;Reduced anterior laryngeal mobility;Reduced epiglottic inversion;Reduced pharyngeal peristalsis;Pharyngeal residue - valleculae;Pharyngeal residue - pyriform Pharyngeal -- Pharyngeal- Thin Teaspoon -- Pharyngeal -- Pharyngeal- Thin Cup Delayed swallow initiation-pyriform sinuses;Reduced airway/laryngeal closure;Penetration/Aspiration during swallow;Trace aspiration;Pharyngeal residue - pyriform Pharyngeal Material enters airway, CONTACTS cords and not ejected out;Material enters airway, passes BELOW cords without attempt by patient to eject out (silent aspiration) Pharyngeal- Thin Straw Delayed swallow initiation-pyriform sinuses;Reduced airway/laryngeal closure;Penetration/Aspiration during swallow;Trace aspiration;Pharyngeal residue - pyriform;Compensatory strategies attempted (with notebox) Pharyngeal Material enters airway, passes BELOW cords without attempt by patient to eject out (silent aspiration);Material enters airway, CONTACTS cords and not ejected out Pharyngeal- Puree Delayed swallow initiation-pyriform sinuses;Reduced tongue base retraction;Reduced laryngeal elevation;Reduced anterior laryngeal mobility;Reduced epiglottic inversion;Reduced pharyngeal peristalsis;Pharyngeal residue - valleculae;Pharyngeal residue - pyriform Pharyngeal -- Pharyngeal- Mechanical Soft -- Pharyngeal -- Pharyngeal- Regular Delayed swallow initiation-pyriform sinuses;Reduced tongue base  retraction;Reduced laryngeal elevation;Reduced anterior laryngeal mobility;Reduced epiglottic inversion;Reduced pharyngeal peristalsis;Pharyngeal residue - valleculae;Pharyngeal residue - pyriform Pharyngeal -- Pharyngeal- Multi-consistency -- Pharyngeal -- Pharyngeal- Pill -- Pharyngeal -- Pharyngeal Comment --  No flowsheet data found. DeBlois, Katherene Ponto 12/01/2018, 12:02 PM              [place] Ekg Site Rite  Result Date: 11/09/2018 If [facility] image not attached, placement could not be confirmed due to current cardiac rhythm.    PERTINENT LAB RESULTS: CBC: Recent Labs    12/07/18 0540 12/08/18 0845  WBC 12.1* 11.4*  HGB 10.2* 9.8*  HCT 33.6* 33.4*  PLT 290 277   CMET CMP     Component Value Date/Time   NA 138 12/07/2018 0540   K 3.9 12/07/2018 0540   CL 102 12/07/2018 0540   CO2 28 12/07/2018 0540   GLUCOSE 117 (H) 12/07/2018 0540   BUN 17 12/07/2018 0540   CREATININE 0.61 12/07/2018 0540   CALCIUM 8.9 12/07/2018 0540   PROT 6.8 12/03/2018 0330   ALBUMIN 3.1 (L) 12/03/2018 0330   AST 39 12/03/2018 0330   ALT 42 12/03/2018 0330   ALKPHOS 121 12/03/2018 0330   BILITOT 0.3 12/03/2018 0330   GFRNONAA >60 12/07/2018 0540   GFRAA >60 12/07/2018 0540    GFR Estimated Creatinine Clearance: 86 mL/min (by C-G formula based on SCr of 0.61 mg/dL). No results for input(s): LIPASE, AMYLASE in the last 72 hours. No results for input(s): CKTOTAL, CKMB, CKMBINDEX, TROPONINI in the last 72 hours. Invalid input(s): POCBNP No results for input(s): DDIMER in the last 72 hours. No results for input(s): HGBA1C in the last 72 hours. No results for input(s): CHOL, HDL, LDLCALC, TRIG, CHOLHDL, LDLDIRECT in the last 72 hours. No results for input(s): TSH, T4TOTAL, T3FREE, THYROIDAB in the last 72 hours.  Invalid input(s): FREET3 No results for input(s): VITAMINB12, FOLATE,  FERRITIN, TIBC, IRON, RETICCTPCT in the last 72 hours. Coags: No results for input(s): INR in the last 72  hours.  Invalid input(s): PT Microbiology: Recent Results (from the past 240 hour(s))  Novel Coronavirus, NAA (hospital order; send-out to ref lab)     Status: None   Collection Time: 12/03/18  8:07 AM   Specimen: Nasopharyngeal Swab; Respiratory  Result Value Ref Range Status   SARS-CoV-2, NAA NOT DETECTED NOT DETECTED Final    Comment: (NOTE) This test was developed and its performance characteristics determined by [facility]. This test has not been FDA cleared or approved. This test has been authorized by FDA under an Emergency Use Authorization (EUA). This test is only authorized for the duration of time the declaration that circumstances exist justifying the authorization of the emergency use of in vitro diagnostic tests for detection of SARS-CoV-2 virus and/or diagnosis of COVID-19 infection under section 564(b)(1) of the Act, 21 U.S.C. 956OZH-0(Q)(6360bbb-3(b)(1), unless the authorization is terminated or revoked sooner. When diagnostic testing is negative, the possibility of a false negative result should be considered in the context of a patient's recent exposures and the presence of clinical signs and symptoms consistent with COVID-19. An individual without symptoms of COVID-19 and who is not shedding SARS-CoV-2 virus would expect to have a negative (not detected) result in this assay. Performed  At: [facility] [place] [address] [place], [place] [id number] Jolene SchimkeNagendra Sanjai MD BM:[id number]:[phone]    Coronavirus Source NASOPHARYNGEAL  Final    Comment: Performed at [facility], [address] [place], [place] [address]    FURTHER DISCHARGE INSTRUCTIONS:  Get Medicines reviewed and adjusted: Please take all your medications with you for your next visit with your Primary MD  Laboratory/radiological data: Please request your Primary MD to go over all hospital tests and procedure/radiological results at the follow up, please ask your Primary MD to get all  Hospital records sent to his/her office.  In some cases, they will be blood work, cultures and biopsy results pending at the time of your discharge. Please request that your primary care M.D. goes through all the records of your hospital data and follows up on these results.  Also Note the following: If you experience worsening of your admission symptoms, develop shortness of breath, life threatening emergency, suicidal or homicidal thoughts you must seek medical attention immediately by calling 911 or calling your MD immediately  if symptoms less severe.  You must read complete instructions/literature along with all the possible adverse reactions/side effects for all the Medicines you take and that have been prescribed to you. Take any new Medicines after you have completely understood and accpet all the possible adverse reactions/side effects.   Do not drive when taking Pain medications or sleeping medications (Benzodaizepines)  Do not take more than prescribed Pain, Sleep and Anxiety Medications. It is not advisable to combine anxiety,sleep and pain medications without talking with your primary care practitioner  Special Instructions: If you have smoked or chewed Tobacco  in the last 2 yrs please stop smoking, stop any regular Alcohol  and or any Recreational drug use.  Wear Seat belts while driving.  Please note: You were cared for by a hospitalist during your hospital stay. Once you are discharged, your primary care physician will handle any further medical issues. Please note that NO REFILLS for any discharge medications will be authorized once you are discharged, as it is imperative that you return to your primary care physician (or establish a  relationship with a primary care physician if you do not have one) for your post hospital discharge needs so that they can reassess your need for medications and monitor your lab values.  Total Time spent coordinating discharge including counseling,  education and face to face time equals  45 minutes.  SignedJeoffrey Massed 12/09/2018 8:51 AM

## 2018-12-18 ENCOUNTER — Other Ambulatory Visit: Payer: Self-pay

## 2018-12-18 ENCOUNTER — Ambulatory Visit: Payer: Self-pay | Attending: Family Medicine | Admitting: Family Medicine

## 2018-12-18 ENCOUNTER — Encounter: Payer: Self-pay | Admitting: Family Medicine

## 2018-12-18 VITALS — BP 119/80 | HR 73 | Temp 98.0°F | Ht 63.0 in | Wt 196.0 lb

## 2018-12-18 DIAGNOSIS — Z8619 Personal history of other infectious and parasitic diseases: Secondary | ICD-10-CM

## 2018-12-18 DIAGNOSIS — D649 Anemia, unspecified: Secondary | ICD-10-CM

## 2018-12-18 DIAGNOSIS — R739 Hyperglycemia, unspecified: Secondary | ICD-10-CM

## 2018-12-18 DIAGNOSIS — Z7982 Long term (current) use of aspirin: Secondary | ICD-10-CM

## 2018-12-18 DIAGNOSIS — R29898 Other symptoms and signs involving the musculoskeletal system: Secondary | ICD-10-CM

## 2018-12-18 DIAGNOSIS — Z09 Encounter for follow-up examination after completed treatment for conditions other than malignant neoplasm: Secondary | ICD-10-CM

## 2018-12-18 DIAGNOSIS — J9601 Acute respiratory failure with hypoxia: Secondary | ICD-10-CM

## 2018-12-18 DIAGNOSIS — Z930 Tracheostomy status: Secondary | ICD-10-CM

## 2018-12-18 DIAGNOSIS — Z8616 Personal history of COVID-19: Secondary | ICD-10-CM

## 2018-12-18 DIAGNOSIS — Z9981 Dependence on supplemental oxygen: Secondary | ICD-10-CM

## 2018-12-18 DIAGNOSIS — I480 Paroxysmal atrial fibrillation: Secondary | ICD-10-CM

## 2018-12-18 MED ORDER — FAMOTIDINE 20 MG PO TABS: 20.0000 mg | ORAL_TABLET | Freq: Every day | ORAL | 5 refills | Status: DC

## 2018-12-18 MED ORDER — METOPROLOL TARTRATE 50 MG PO TABS: 50.0000 mg | ORAL_TABLET | Freq: Two times a day (BID) | ORAL | 3 refills | Status: DC

## 2018-12-18 NOTE — Progress Notes (Signed)
Patient is having trouble sleeping  Script for pulse ox meter.  Handicap place card form needs to be filled out.

## 2018-12-18 NOTE — Progress Notes (Signed)
Established Patient Office Visit  Subjective:  Patient ID: Shannon BradfordDeborah Meyer, female    DOB: 12/03/1965  Age: 53 y.o. MRN: [id number]  CC:  Chief Complaint  Patient presents with   Hospitalization Follow-up    HPI Shannon Meyer presents for hospital follow-up status post [facility] secondary to acute hypoxic respiratory failure secondary to COVID-19 pneumonia requiring intubation.  Patient had a prolonged hospitalization as she was difficult to wean off of the ventilator and underwent tracheostomy.  Patient also has pseudomonal pneumonia for which she was treated.  Patient underwent decannulation of tracheostomy on 12/08/2018.  Patient reports that at today's visit she feels better but continues to be fatigued.  She reports that she likely should have sought medical attention earlier as she had onset of cough, shortness of breath, loss of sense of taste and smell and fatigue for a few weeks prior to her hospitalization.  She is not sure how she contracted COVID-19.  Patient is short of breath with minimal exertion.  She is receiving home physical therapy.  Patient continues to use home oxygen at 2 L.  She reports no issues with her tracheostomy site and feels that it is healing well.  Patient was not aware of the need for cardiology follow-up which was scheduled due to patient having episodes of atrial fibrillation during her hospitalization.  She is taking the daily aspirin and reports no unusual bruising or bleeding.  She is also taking the Pepcid for stomach protection.  She also continues to take metoprolol.  She denies any current cough.  She does not have any fever, no headache or dizziness.  She denies any abdominal pain-no nausea/vomiting/diarrhea or constipation.  She continues to have some hoarseness as well as losing her voice after speaking for extended periods.  She reports that she is looking forward to her voice returning to normal.  She  reports no medical issues prior to her hospitalization.  She does not drink or smoke.  She was not working prior to hospitalization but was keeping her grandchildren.  She has family history significant for father with lung cancer and dementia.  Patient with maternal aunt and aunt side of the family with diabetes.  Patient does not have any personal history of any illnesses.  Patient did not have any surgeries prior to her tracheostomy.  She is accompanied at today's visit by her daughter with whom she is living.  Past Medical History:  Diagnosis Date   Known health problems: none    prior to hospitalization with COVID-19     Past Surgical History:  Procedure Laterality Date   TRACHEOSTOMY      Family History  Problem Relation Age of Onset   Lung cancer Father    Dementia Father     Social History   Tobacco Use   Smoking status: Never Smoker   Smokeless tobacco: Never Used  Substance Use Topics   Alcohol use: Not Currently   Drug use: Not Currently    Outpatient Medications Prior to Visit  Medication Sig Dispense Refill   acetaminophen (TYLENOL) 325 MG tablet Take 650 mg by mouth every 6 (six) hours as needed for mild pain, moderate pain or fever.     aspirin EC 81 MG EC tablet Take 1 tablet (81 mg total) by mouth daily.     feeding supplement, ENSURE ENLIVE, (ENSURE ENLIVE) LIQD Take 237 mLs by mouth 3 (three) times daily between meals. 21330 mL 0  famotidine (PEPCID) 20 MG tablet Take 1 tablet (20 mg total) by mouth at bedtime. 30 tablet 0   metoprolol tartrate (LOPRESSOR) 50 MG tablet Take 1 tablet (50 mg total) by mouth 2 (two) times daily. 60 tablet 0   No facility-administered medications prior to visit.     No Known Allergies  ROS Review of Systems  Constitutional: Positive for fatigue. Negative for chills and fever.  HENT: Negative for congestion, nosebleeds, sore throat and trouble swallowing.   Eyes: Negative for photophobia and visual  disturbance.  Respiratory: Positive for shortness of breath. Negative for cough.   Cardiovascular: Negative for chest pain, palpitations and leg swelling.  Gastrointestinal: Positive for constipation. Negative for abdominal pain, diarrhea and nausea.  Endocrine: Negative for polydipsia, polyphagia and polyuria.  Genitourinary: Negative for dysuria, flank pain and frequency.  Musculoskeletal: Negative for back pain and gait problem.  Skin: Negative for rash and wound.  Neurological: Negative for dizziness and headaches.  Hematological: Negative for adenopathy. Does not bruise/bleed easily.  Psychiatric/Behavioral: Positive for sleep disturbance. Negative for self-injury and suicidal ideas.      Objective:    Physical Exam  Constitutional: She is oriented to person, place, and time. She appears well-developed and well-nourished.  Obese older female in no acute distress sitting in a wheelchair with cushion seat.  Patient is wearing oxygen by nasal cannula.  Patient is accompanied by her daughter.  Patient with a bandage over the tracheostomy wound site; soft, slightly hoarse speaking voice  Neck: Normal range of motion. Neck supple.  Presence of tracheostomy  Cardiovascular: Normal rate and regular rhythm.  Pulmonary/Chest: Effort normal and breath sounds normal.  Mild decreased breath sounds but lungs are clear to auscultation  Abdominal: Soft. There is no abdominal tenderness. There is no rebound and no guarding.  Musculoskeletal:        General: No tenderness or edema.  Lymphadenopathy:    She has no cervical adenopathy.  Neurological: She is alert and oriented to person, place, and time.  Skin: Skin is warm and dry.  Psychiatric: She has a normal mood and affect. Her behavior is normal. Judgment and thought content normal.  Nursing note and vitals reviewed.   BP 119/80    Pulse 73    Temp 98 F (36.7 C) (Oral)    Ht 5\' 3"  (1.6 m)    Wt 196 lb (88.9 kg)    SpO2 100%    BMI 34.72  kg/m  Wt Readings from Last 3 Encounters:  12/25/18 195 lb 12.8 oz (88.8 kg)  12/18/18 196 lb (88.9 kg)  12/06/18 197 lb 5 oz (89.5 kg)     Health Maintenance Due  Topic Date Due   Hepatitis C Screening  [birth year]   HIV Screening  10/11/1978   TETANUS/TDAP  10/11/1982   PAP SMEAR-Modifier  10/10/1984   MAMMOGRAM  10/10/2013   COLONOSCOPY  10/10/2013   INFLUENZA VACCINE  01/03/2019    There are no preventive care reminders to display for this patient.  Lab Results  Component Value Date   TSH 1.034 11/19/2018   Lab Results  Component Value Date   WBC 9.7 12/18/2018   HGB 10.7 (L) 12/18/2018   HCT 34.4 12/18/2018   MCV 89 12/18/2018   PLT 414 12/18/2018   Lab Results  Component Value Date   NA 140 12/18/2018   K 4.2 12/18/2018   CO2 24 12/18/2018   GLUCOSE 99 12/18/2018   BUN 12 12/18/2018   CREATININE 0.79  12/18/2018   BILITOT 0.3 12/03/2018   ALKPHOS 121 12/03/2018   AST 39 12/03/2018   ALT 42 12/03/2018   PROT 6.8 12/03/2018   ALBUMIN 3.1 (L) 12/03/2018   CALCIUM 9.7 12/18/2018   ANIONGAP 8 12/07/2018   No results found for: CHOL No results found for: HDL No results found for: [facility] Lab Results  Component Value Date   TRIG 207 (H) 11/01/2018   No results found for: CHOLHDL Lab Results  Component Value Date   HGBA1C 5.5 12/18/2018      Assessment & Plan:  .1. Acute respiratory failure with hypoxemia (HCC);8.  Oxygen dependent 9. Hospital Discharge Follow-up; 10.  History of COVID-19 infection Patient is status post hospitalization of 48 days from 10/22/2018 through 12/09/2018 due to acute respiratory failure requiring intubation and mechanical ventilation due to COVID-19 respiratory disease.  Patient still with oxygen requirement status post hospital discharge.  Patient will have BMP in follow-up of electrolyte and renal function as well as due to elevated blood sugars during her hospitalization.  Patient reports that she has gradually started  to feel better and is especially glad to be at home status post prolonged hospitalization.  Medications and hospital discharge findings/follow-ups discussed with the patient and her daughter at today's visit.  Patient has received home oxygen supplies.  Clinical nurse coordinator will meet with the patient and her daughter to discuss home health/oxygen needs. - Basic Metabolic Panel  2. Paroxysmal atrial fibrillation [facility](HCC) Keep cardiology follow-up. Discussed paroxysmal atrial fibrillation along with increased risk of embolic cerebrovascular accident/stroke with the patient and daughter at today's visit.  On examination, patient is actually in normal sinus rhythm at today's visit and heart rate is controlled.  Continue use of metoprolol as well as daily aspirin. - metoprolol tartrate (LOPRESSOR) 50 MG tablet; Take 1 tablet (50 mg total) by mouth 2 (two) times daily.  Dispense: 60 tablet; Refill: 3  3. Muscular deconditioning Patient will participate in physical therapy to help regain strength from deconditioning secondary to prolonged hospitalization with intubation and mechanical ventilation secondary to COVID-19 pneumonia/acute respiratory failure.  4. Tracheostomy status [facility](HCC) Patient and daughter report that tracheostomy site is healing well.   5. Normocytic anemia CBC in follow-up of anemia as patient with Hgb of 9.3 with MCV of 96.3 on 12/06/2018.  - CBC with Differential  6. Elevated blood sugar level Patient will have BMP and Hgb A1c due to elevated blood sugars during hospitalization but patient was also on steroids during her hospitalization which may have increased her blood sugars.  - Basic Metabolic Panel - Hemoglobin A1c  7. Long term (current) use of aspirin Continue famotidine 20 mg for stomach protection due to long term use of aspirin and prevention of stress ulcer.  - famotidine (PEPCID) 20 MG tablet; Take 1 tablet (20 mg total) by mouth at bedtime.  Dispense: 30 tablet;  Refill: 5  11.  Sleep difficulty Discussed the use of over-the-counter melatonin to help with sleep.  Patient may start with 5 mg and increase to 10 mg nightly if the 5 mg dose is not effective.  Also discussed use of over-the-counter children's dose of Benadryl at 12.5 mg but can increase to 25 mg to 5 mg is not effective as patient also with some mild rhinorrhea which may be helped by the use of Benadryl as well as hopefully also helping with initiation of sleep.  Meds ordered this encounter  Medications   famotidine (PEPCID) 20 MG tablet  Sig: Take 1 tablet (20 mg total) by mouth at bedtime.    Dispense:  30 tablet    Refill:  5   metoprolol tartrate (LOPRESSOR) 50 MG tablet    Sig: Take 1 tablet (50 mg total) by mouth 2 (two) times daily.    Dispense:  60 tablet    Refill:  3    Follow-up: Return in about 2 weeks (around 01/01/2019) for can patient be seen by Dr. Joya Gaskins in follow-up of respiratory failure with oxygen requirement.   Greater than 30 minutes spent with face-to-face encounter with patient not including additional time spent for review of medical records, labs and imaging.  Handicap placard completed for the patient.  Antony Blackbird, MD

## 2018-12-18 NOTE — Patient Instructions (Signed)
Atrial Fibrillation ° °Atrial fibrillation is a type of heartbeat that is irregular or fast (rapid). If you have this condition, your heart beats without any order. This makes it hard for your heart to pump blood in a normal way. Having this condition gives you more risk for stroke, heart failure, and other heart problems. °Atrial fibrillation may start all of a sudden and then stop on its own, or it may become a long-lasting problem. °What are the causes? °This condition may be caused by heart conditions, such as: °· High blood pressure. °· Heart failure. °· Heart valve disease. °· Heart surgery. °Other causes include: °· Pneumonia. °· Obstructive sleep apnea. °· Lung cancer. °· Thyroid disease. °· Drinking too much alcohol. °Sometimes the cause is not known. °What increases the risk? °You are more likely to develop this condition if: °· You smoke. °· You are older. °· You have diabetes. °· You are overweight. °· You have a family history of this condition. °· You exercise often and hard. °What are the signs or symptoms? °Common symptoms of this condition include: °· A feeling like your heart is beating very fast. °· Chest pain. °· Feeling short of breath. °· Feeling light-headed or weak. °· Getting tired easily. °Follow these instructions at home: °Medicines °· Take over-the-counter and prescription medicines only as told by your doctor. °· If your doctor gives you a blood-thinning medicine, take it exactly as told. Taking too much of it can cause bleeding. Taking too little of it does not protect you against clots. Clots can cause a stroke. °Lifestyle ° °  ° °· Do not use any tobacco products. These include cigarettes, chewing tobacco, and e-cigarettes. If you need help quitting, ask your doctor. °· Do not drink alcohol. °· Do not drink beverages that have caffeine. These include coffee, soda, and tea. °· Follow diet instructions as told by your doctor. °· Exercise regularly as told by your doctor. °General  instructions °· If you have a condition that causes breathing to stop for a short period of time (apnea), treat it as told by your doctor. °· Keep a healthy weight. Do not use diet pills unless your doctor says they are safe for you. Diet pills may make heart problems worse. °· Keep all follow-up visits as told by your doctor. This is important. °Contact a doctor if: °· You notice a change in the speed, rhythm, or strength of your heartbeat. °· You are taking a blood-thinning medicine and you see more bruising. °· You get tired more easily when you move or exercise. °· You have a sudden change in weight. °Get help right away if: ° °· You have pain in your chest or your belly (abdomen). °· You have trouble breathing. °· You have blood in your vomit, poop, or pee (urine). °· You have any signs of a stroke. "BE FAST" is an easy way to remember the main warning signs: °? B - Balance. Signs are dizziness, sudden trouble walking, or loss of balance. °? E - Eyes. Signs are trouble seeing or a change in how you see. °? F - Face. Signs are sudden weakness or loss of feeling in the face, or the face or eyelid drooping on one side. °? A - Arms. Signs are weakness or loss of feeling in an arm. This happens suddenly and usually on one side of the body. °? S - Speech. Signs are sudden trouble speaking, slurred speech, or trouble understanding what people say. °? T - Time.   Time to call emergency services. Write down what time symptoms started. °· You have other signs of a stroke, such as: °? A sudden, very bad headache with no known cause. °? Feeling sick to your stomach (nausea). °? Throwing up (vomiting). °? Jerky movements you cannot control (seizure). °These symptoms may be an emergency. Do not wait to see if the symptoms will go away. Get medical help right away. Call your local emergency services (911 in the [place] Do not drive yourself to the hospital. °Summary °· Atrial fibrillation is a type of heartbeat that is irregular  or fast (rapid). °· You are at higher risk of this condition if you smoke, are older, have diabetes, or are overweight. °· Follow your doctor's instructions about medicines, diet, exercise, and follow-up visits. °· Get help right away if you think that you have signs of a stroke. °This information is not intended to replace advice given to you by your health care provider. Make sure you discuss any questions you have with your health care provider. °Document Released: 02/28/2008 Document Revised: 07/25/2017 Document Reviewed: 07/12/2017 °Elsevier Patient Education © 2020 [employer] ° °

## 2018-12-19 ENCOUNTER — Telehealth: Payer: Self-pay | Admitting: *Deleted

## 2018-12-19 LAB — BASIC METABOLIC PANEL WITH GFR
BUN/Creatinine Ratio: 15 (ref 9–23)
BUN: 12 mg/dL (ref 6–24)
CO2: 24 mmol/L (ref 20–29)
Calcium: 9.7 mg/dL (ref 8.7–10.2)
Chloride: 101 mmol/L (ref 96–106)
Creatinine, Ser: 0.79 mg/dL (ref 0.57–1.00)
GFR calc Af Amer: 97 mL/min/1.73
GFR calc non Af Amer: 85 mL/min/1.73
Glucose: 99 mg/dL (ref 65–99)
Potassium: 4.2 mmol/L (ref 3.5–5.2)
Sodium: 140 mmol/L (ref 134–144)

## 2018-12-19 LAB — CBC WITH DIFFERENTIAL/PLATELET
Basophils Absolute: 0.1 x10E3/uL (ref 0.0–0.2)
Basos: 1 %
EOS (ABSOLUTE): 0.5 x10E3/uL — ABNORMAL HIGH (ref 0.0–0.4)
Eos: 5 %
Hematocrit: 34.4 % (ref 34.0–46.6)
Hemoglobin: 10.7 g/dL — ABNORMAL LOW (ref 11.1–15.9)
Immature Grans (Abs): 0 x10E3/uL (ref 0.0–0.1)
Immature Granulocytes: 0 %
Lymphocytes Absolute: 4.2 x10E3/uL — ABNORMAL HIGH (ref 0.7–3.1)
Lymphs: 43 %
MCH: 27.8 pg (ref 26.6–33.0)
MCHC: 31.1 g/dL — ABNORMAL LOW (ref 31.5–35.7)
MCV: 89 fL (ref 79–97)
Monocytes Absolute: 1 x10E3/uL — ABNORMAL HIGH (ref 0.1–0.9)
Monocytes: 10 %
Neutrophils Absolute: 3.9 x10E3/uL (ref 1.4–7.0)
Neutrophils: 41 %
Platelets: 414 x10E3/uL (ref 150–450)
RBC: 3.85 x10E6/uL (ref 3.77–5.28)
RDW: 15.8 % — ABNORMAL HIGH (ref 11.7–15.4)
WBC: 9.7 x10E3/uL (ref 3.4–10.8)

## 2018-12-19 LAB — HEMOGLOBIN A1C
Est. average glucose Bld gHb Est-mCnc: 111 mg/dL
Hgb A1c MFr Bld: 5.5 % (ref 4.8–5.6)

## 2018-12-19 NOTE — Telephone Encounter (Signed)
Patient verified DOB Patient is aware of BMP/A1C being normal and to take OTC iron for the improved anemia. No further questions.

## 2018-12-19 NOTE — Telephone Encounter (Signed)
-----   Message from Antony Blackbird, MD sent at 12/19/2018  8:28 AM EDT ----- BMP is normal. Hgb A1c was normal at 5.5 (blood sugars normal over the past 90 days). Anemia is improved at 10.7- please take an over the counter iron supplement once per day for the next 30 days.

## 2018-12-25 ENCOUNTER — Ambulatory Visit (INDEPENDENT_AMBULATORY_CARE_PROVIDER_SITE_OTHER): Payer: Self-pay | Admitting: Cardiology

## 2018-12-25 ENCOUNTER — Other Ambulatory Visit: Payer: Self-pay

## 2018-12-25 ENCOUNTER — Telehealth: Payer: Self-pay | Admitting: Cardiology

## 2018-12-25 ENCOUNTER — Encounter: Payer: Self-pay | Admitting: Cardiology

## 2018-12-25 VITALS — BP 118/64 | HR 74 | Ht 63.0 in | Wt 195.8 lb

## 2018-12-25 DIAGNOSIS — J988 Other specified respiratory disorders: Secondary | ICD-10-CM

## 2018-12-25 DIAGNOSIS — U071 COVID-19: Secondary | ICD-10-CM

## 2018-12-25 DIAGNOSIS — I4891 Unspecified atrial fibrillation: Secondary | ICD-10-CM

## 2018-12-25 NOTE — Progress Notes (Signed)
Cardiology Office Note  NEW PT NOTE    Date:  12/25/2018   ID:  [name], DOB 02/24/1966, MRN [id number]  PCP:  Antony Blackbird, MD  Cardiologist:  Dr. Marlou Porch     Chief Complaint  Patient presents with  . Hospitalization Follow-up  . Atrial Fibrillation      History of Present Illness: Shannon Meyer is a 53 y.o. female who presents for pt hospitalized 10/22/18 to 12/09/18 for COVID. She was on vent and eventual trach for acute hypoxic respiratory failure due to COVID-!( pneumonia. Both treated.   She was treated with steroids, remdesivir and actemra.  She underwent decannulation of trach on 12/08/18.  Her acute encephalopathy resolved.  She did have a brief run of PAF on 6/24 and at discharge was in [place].  She does continue on 2 L [place]  Dr. Marlou Porch discussed with Dr. Candiss Norse and pt on ASA and metoprolol.  She is here to eval and schedule outpt echo. She was also known to have prolonged QT prolongation.    Recheck of covid was neg on 12/04/18  Recent  Labs  12/18/18 Hgb 10.7, WBC 9.7 plts 414  hgbA1c was 5.5 Na 140, K+ 4.2 Cr 0.79   On review of tele strips she did have SVT at 170 for about 6 seconds. Another episode of possible NSVT.    Today her daughter is with her -pt in a wheelchair her trach site is covered.  Her energy is slowly coming back.  She has no chest pain and some SOB with exertion.  She is still on home oxygen.  Prior to illness she had symptoms of mild SOB with exertion but had increased to an awareness of the dyspnea.  Never with chest pain.   Her COVID symptoms were fever and no appetite, on arrival to ER she quickly decompensated.  No awareness of irregular HR.   Past Medical History:  Diagnosis Date  . Known health problems: none    prior to hospitalization with COVID-19     Past Surgical History:  Procedure Laterality Date  . TRACHEOSTOMY       Current Outpatient Medications  Medication Sig Dispense Refill  . acetaminophen (TYLENOL) 325 MG tablet Take 650 mg by  mouth every 6 (six) hours as needed for mild pain, moderate pain or fever.    Marland Kitchen aspirin EC 81 MG EC tablet Take 1 tablet (81 mg total) by mouth daily.    . famotidine (PEPCID) 20 MG tablet Take 1 tablet (20 mg total) by mouth at bedtime. 30 tablet 5  . feeding supplement, ENSURE ENLIVE, (ENSURE ENLIVE) LIQD Take 237 mLs by mouth 3 (three) times daily between meals. 21330 mL 0  . metoprolol tartrate (LOPRESSOR) 50 MG tablet Take 1 tablet (50 mg total) by mouth 2 (two) times daily. 60 tablet 3   No current facility-administered medications for this visit.     Allergies:   Patient has no known allergies.    Social History:  The patient  reports that she has never smoked. She has never used smokeless tobacco. She reports previous alcohol use. She reports previous drug use.   Family History:  The patient's family history includes Dementia in her father; Lung cancer in her father.   No FH of CAD   ROS:  General:no colds or fevers, no weight changes though no wts prior to COVID Skin:no rashes or ulcers HEENT:no blurred vision, no congestion CV:see HPI PUL:see HPI GI:no diarrhea constipation or melena, no indigestion  GU:no hematuria, no dysuria MS:no joint pain, no claudication Neuro:no syncope, no lightheadedness Endo:no diabetes, no thyroid disease  Wt Readings from Last 3 Encounters:  12/25/18 195 lb 12.8 oz (88.8 kg)  12/18/18 196 lb (88.9 kg)  12/06/18 197 lb 5 oz (89.5 kg)     PHYSICAL EXAM: VS:  BP 118/64   Pulse 74   Ht 5\' 3"  (1.6 m)   Wt 195 lb 12.8 oz (88.8 kg)   SpO2 93%   BMI 34.68 kg/m  , BMI Body mass index is 34.68 kg/m. General:Pleasant affect, NAD Skin:Warm and dry, brisk capillary refill HEENT:normocephalic, sclera clear, mucus membranes moist Neck:supple, no JVD, no bruits, trach site covered no swelling   Heart:S1S2 RRR without murmur, gallup, rub or click Lungs:clear without rales, rhonchi, or wheezes ZOX:WRUEAbd:soft, non tender, + BS, do not palpate liver  spleen or masses Ext:no lower ext edema, 2+ pedal pulses, 2+ radial pulses Neuro:alert and oriented X 3, MAE, follows commands, + facial symmetry    EKG:  EKG is ordered today. The ekg ordered today demonstrates SR with non specific T wave abnormality similar to 10/22/18    Recent Labs: 11/19/2018: TSH 1.034 11/28/2018: B Natriuretic Peptide 89.5 12/03/2018: ALT 42 12/06/2018: Magnesium 2.2 12/18/2018: BUN 12; Creatinine, Ser 0.79; Hemoglobin 10.7; Platelets 414; Potassium 4.2; Sodium 140    Lipid Panel    Component Value Date/Time   TRIG 207 (H) 11/01/2018 0420       Other studies Reviewed: Additional studies/ records that were reviewed today include: review of hospital notes and EKGs and tele strips.   ASSESSMENT AND PLAN:  1.  PAF during hospitalization with COVID and PNA on ASA and lopressor continue will do 30 day event monitor will check echo as well.   No angina. Will check echo.   2.  Recovery from COVID and PNA.  Doing well. Trach site healing.    Current medicines are reviewed with the patient today.  The patient Has no concerns regarding medicines.  The following changes have been made:  See above Labs/ tests ordered today include:see above  Disposition:   FU:  see above  Signed, Nada BoozerLaura Laurita Peron, NP  12/25/2018 3:57 PM    [facility] [address], [place], [place]  [address]/ [address] [place], [place] Phone: [phone]; Fax: [phone]  [phone]

## 2018-12-25 NOTE — Telephone Encounter (Signed)
New Message         COVID-19 Pre-Screening Questions:   In the past 7 to 10 days have you had a cough,  shortness of breath, headache, congestion, fever (100 or greater) body aches, chills, sore throat, or sudden loss of taste or sense of smell? NO  Have you been around anyone with known Covid 19. NO  Have you been around anyone who is awaiting Covid 19 test results in the past 7 to 10 days? NO  Have you been around anyone who has been exposed to Covid 19, or has mentioned symptoms of Covid 19 within the past 7 to 10 days? NO Pt is in a wheelchair and will have someone there to assist her   If you have any concerns/questions about symptoms patients report during screening (either on the phone or at threshold). Contact the provider seeing the patient or DOD for further guidance.  If neither are available contact a member of the leadership team.

## 2018-12-25 NOTE — Patient Instructions (Signed)
Medication Instructions:  Your physician recommends that you continue on your current medications as directed. Please refer to the Current Medication list given to you today.  If you need a refill on your cardiac medications before your next appointment, please call your pharmacy.   Lab work: NONE If you have labs (blood work) drawn today and your tests are completely normal, you will receive your results only by: Marland Kitchen MyChart Message (if you have MyChart) OR . A paper copy in the mail If you have any lab test that is abnormal or we need to change your treatment, we will call you to review the results.  Testing/Procedures: Your physician has requested that you have an echocardiogram. Echocardiography is a painless test that uses sound waves to create images of your heart. It provides your doctor with information about the size and shape of your heart and how well your heart's chambers and valves are working. This procedure takes approximately one hour. There are no restrictions for this procedure.  Your physician has recommended that you wear an 30-DAY event monitor. Event monitors are medical devices that record the heart's electrical activity. Doctors most often [place] these monitors to diagnose arrhythmias. Arrhythmias are problems with the speed or rhythm of the heartbeat. The monitor is a small, portable device. You can wear one while you do your normal daily activities. This is usually used to diagnose what is causing palpitations/syncope (passing out).  Follow-Up: At [facility], you and your health needs are our priority.  As part of our continuing mission to provide you with exceptional heart care, we have created designated Provider Care Teams.  These Care Teams include your primary Cardiologist (physician) and Advanced Practice Providers (APPs -  Physician Assistants and Nurse Practitioners) who all work together to provide you with the care you need, when you need it. You will need a  follow up appointment in 2 months.   You may see Candee Furbish, MD or one of the following Advanced Practice Providers on your designated Care Team:   Truitt Merle, NP Cecilie Kicks, NP . Kathyrn Drown, NP  Any Other Special Instructions Will Be Listed Below (If Applicable).  Echocardiogram An echocardiogram is a procedure that uses painless sound waves (ultrasound) to produce an image of the heart. Images from an echocardiogram can provide important information about:  Signs of coronary artery disease (CAD).  Aneurysm detection. An aneurysm is a weak or damaged part of an artery wall that bulges out from the normal force of blood pumping through the body.  Heart size and shape. Changes in the size or shape of the heart can be associated with certain conditions, including heart failure, aneurysm, and CAD.  Heart muscle function.  Heart valve function.  Signs of a past heart attack.  Fluid buildup around the heart.  Thickening of the heart muscle.  A tumor or infectious growth around the heart valves. Tell a health care provider about:  Any allergies you have.  All medicines you are taking, including vitamins, herbs, eye drops, creams, and over-the-counter medicines.  Any blood disorders you have.  Any surgeries you have had.  Any medical conditions you have.  Whether you are pregnant or may be pregnant. What are the risks? Generally, this is a safe procedure. However, problems may occur, including:  Allergic reaction to dye (contrast) that may be used during the procedure. What happens before the procedure? No specific preparation is needed. You may eat and drink normally. What happens during the procedure?  An IV tube may be inserted into one of your veins.  You may receive contrast through this tube. A contrast is an injection that improves the quality of the pictures from your heart.  A gel will be applied to your chest.  A wand-like tool (transducer) will be  moved over your chest. The gel will help to transmit the sound waves from the transducer.  The sound waves will harmlessly bounce off of your heart to allow the heart images to be captured in real-time motion. The images will be recorded on a computer. The procedure may vary among health care providers and hospitals. What happens after the procedure?  You may return to your normal, everyday life, including diet, activities, and medicines, unless your health care provider tells you not to do that. Summary  An echocardiogram is a procedure that uses painless sound waves (ultrasound) to produce an image of the heart.  Images from an echocardiogram can provide important information about the size and shape of your heart, heart muscle function, heart valve function, and fluid buildup around your heart.  You do not need to do anything to prepare before this procedure. You may eat and drink normally.  After the echocardiogram is completed, you may return to your normal, everyday life, unless your health care provider tells you not to do that. This information is not intended to replace advice given to you by your health care provider. Make sure you discuss any questions you have with your health care provider. Document Released: 05/18/2000 Document Revised: 09/11/2018 Document Reviewed: 06/23/2016 Elsevier Patient Education  2020 [employer]

## 2019-01-02 ENCOUNTER — Ambulatory Visit (HOSPITAL_COMMUNITY): Payer: Self-pay | Attending: Internal Medicine

## 2019-01-02 ENCOUNTER — Other Ambulatory Visit: Payer: Self-pay

## 2019-01-02 DIAGNOSIS — I4891 Unspecified atrial fibrillation: Secondary | ICD-10-CM

## 2019-01-02 MED ORDER — PERFLUTREN LIPID MICROSPHERE
1.0000 mL | INTRAVENOUS | Status: AC | PRN
  Administered 2019-01-02: 2 mL via INTRAVENOUS

## 2019-01-04 NOTE — Progress Notes (Signed)
Subjective:    Patient ID: Shannon Meyer, female    DOB: August 20, 1965, 53 y.o.   MRN: [id number]  This is a very pleasant 53 year old female who was admitted between the May 20 and July 7 for severe respiratory failure and ARDS secondary to COVID-19 pneumonia.  This patient was intubated in the emergency room and brought over to the [facility] where she had a very prolonged hospital course.  She was difficult to wean off the ventilator and ultimately underwent tracheostomy which was finally taken out on July 6 and she was sent home on July 7.  All of her rehab has progressed quite nicely.  She is on a regular diet she is breathing better she is still on the oxygen 2 L continuous.  She is no longer coughing.  She is markedly improved with her breathing.  Below is a copy of the discharge summary.  Note her patient's primary care physician wanted my review of her pulmonary status.  Admit Date: 10/22/2018 Discharge date: 12/09/2018 Diet recommendation:  Diet recommendations: Dysphagia 3 (mechanical soft);Thin liquid Liquids provided via: Cup;Straw Medication Administration: Whole meds with puree Supervision: Patient able to self feed;Intermittent supervision to cue for compensatory strategies Compensations: Slow rate;Small sips/bites;Follow solids with liquid;Multiple dry swallows after each bite/sip Postural Changes and/or Swallow Maneuvers: Seated upright 90 degrees  Brief Summary: See H&P, Labs, Consult and Test reports for all details in brief, Patient is a53 y.o.femalewith no significant past medical history-presented with shortness of breath-found to have acute hypoxic respiratory failure secondary to COVID-19 pneumonia-intubated in the emergency room and admitted to [facility]. She had a prolonged hospital course-she was difficult to wean off the ventilator and underwent tracheostomy. She also had pseudomonal pneumonia and was treated with antimicrobial therapy. She  continues to slowly improve-with decreasing FiO2 requirements, PCCM following for trach care.  Brief Hospital Course: Acute Hypoxic Resp Failure due to Covid 19 Viral pneumonia: Prolonged hospital course requiring prolonged ventilation and subsequent tracheostomy.  Treated with steroids, Remdesivir, and Actemra.  Over the past few days with stable oxygen requirements-clinically improving-PCCM followed closely and patient underwent decannulation of tracheostomy on 7/6.   COVID-19 Medications: Remdesivir>> 5/21 Actemra>> 5/20 and 5/21  Pseudomonas pneumonia:Improved-completed Fortaz-afebrile and not short of breath.  Acute encephalopathy/delirium: Resolved-completely awake and alert.   Brief run of PAF on 6/24:Remains in sinus rhythm-prior MD-Dr. Thedore Mins discussed with Dr. Jory Sims aspirin and metoprolol-recommendations are for outpatient echocardiogram and outpatient cardiology follow-up.  Deconditioning/debility: PT following-recommendations were for CIR-but patient very anxious to go home-hence being discharged with maximum home health services.  PT followed up on 7/6 with recommendations for home health as well  Tongue laceration: Resolved-seems to have healed well-doubt any further follow-up required. Suspected to be injured during intubation.  Obesity  Procedures/Studies: ETT>> 5/20-6/11 Tracheostomy>> 6/11 Right arm PICC line>> 6/7  Discharge Diagnoses:  Principal Problem:   COVID-19 Active Problems:   Leukocytosis   Sepsis (HCC)   Elevated LFTs   QT prolongation   Acute respiratory failure with hypoxemia (HCC)   Pressure injury of skin   Tracheostomy status (HCC)   ARDS (adult respiratory distress syndrome) (HCC)   Tracheostomy care (HCC)  Note if she does exert herself significantly she will get out of breath otherwise her breathing is improved.  She takes the oxygen off if she is resting.  She is wearing it at night at 2 L.  She is receiving  physical therapy and still requires a walker and wheelchair.  She  had a significant critical illness neuropathy myopathy.    Past Medical History:  Diagnosis Date  . Acute respiratory failure with hypoxemia (HCC)   . ARDS (adult respiratory distress syndrome) (HCC)   . COVID-19 10/22/2018  . Known health problems: none    prior to hospitalization with COVID-19   . Pressure injury of skin 11/07/2018  . Tracheostomy status (HCC)      Family History  Problem Relation Age of Onset  . Lung cancer Father   . Dementia Father      Social History   Socioeconomic History  . Marital status: Single    Spouse name: Not on file  . Number of children: Not on file  . Years of education: Not on file  . Highest education level: Not on file  Occupational History  . Not on file  Social Needs  . Financial resource strain: Not on file  . Food insecurity    Worry: Not on file    Inability: Not on file  . Transportation needs    Medical: Not on file    Non-medical: Not on file  Tobacco Use  . Smoking status: Never Smoker  . Smokeless tobacco: Never Used  Substance and Sexual Activity  . Alcohol use: Not Currently  . Drug use: Not Currently  . Sexual activity: Not Currently    Birth control/protection: None  Lifestyle  . Physical activity    Days per week: Not on file    Minutes per session: Not on file  . Stress: Not on file  Relationships  . Social Musicianconnections    Talks on phone: Not on file    Gets together: Not on file    Attends religious service: Not on file    Active member of club or organization: Not on file    Attends meetings of clubs or organizations: Not on file    Relationship status: Not on file  . Intimate partner violence    Fear of current or ex partner: Not on file    Emotionally abused: Not on file    Physically abused: Not on file    Forced sexual activity: Not on file  Other Topics Concern  . Not on file  Social History Narrative  . Not on file     No  Known Allergies   Outpatient Medications Prior to Visit  Medication Sig Dispense Refill  . acetaminophen (TYLENOL) 325 MG tablet Take 650 mg by mouth every 6 (six) hours as needed for mild pain, moderate pain or fever.    Marland Kitchen. aspirin EC 81 MG EC tablet Take 1 tablet (81 mg total) by mouth daily.    . famotidine (PEPCID) 20 MG tablet Take 1 tablet (20 mg total) by mouth at bedtime. 30 tablet 5  . feeding supplement, ENSURE ENLIVE, (ENSURE ENLIVE) LIQD Take 237 mLs by mouth 3 (three) times daily between meals. 21330 mL 0  . metoprolol tartrate (LOPRESSOR) 50 MG tablet Take 1 tablet (50 mg total) by mouth 2 (two) times daily. 60 tablet 3   No facility-administered medications prior to visit.       Review of Systems Pos in bold Constitutional:   No  weight loss, night sweats,  Fevers, chills, fatigue, lassitude. HEENT:   No headaches,  Difficulty swallowing,  Tooth/dental problems,  Sore throat,                No sneezing, itching, ear ache, nasal congestion, post nasal drip,   CV:  No chest  pain,  Orthopnea, PND, swelling in lower extremities, anasarca, dizziness, palpitations  GI  No heartburn, indigestion, abdominal pain, nausea, vomiting, diarrhea, change in bowel habits, loss of appetite  Resp:  shortness of breath with exertion not at rest.  No excess mucus, no productive cough,  No non-productive cough,  No coughing up of blood.  No change in color of mucus.  No wheezing.  No chest wall deformity  Skin: no rash or lesions.  GU: no dysuria, change in color of urine, no urgency or frequency.  No flank pain.  MS:  No joint pain or swelling.  No decreased range of motion.  No back pain.  Psych:  No change in mood or affect. No depression or anxiety.  No memory loss.     Objective:   Physical Exam  Vitals:   01/05/19 1102  BP: (!) 149/91  Pulse: 67  Temp: 98.1 F (36.7 C)  TempSrc: Oral  SpO2: 100%  Height: 5\' 3"  (1.6 m)    Gen: Pleasant, well-nourished, in no distress,   normal affect  ENT: No lesions,  mouth clear,  oropharynx clear, no postnasal drip  Neck: No JVD, no TMG, no carotid bruits, trach site is well-healed  Lungs: No use of accessory muscles, no dullness to percussion, clear without rales or rhonchi  Cardiovascular: RRR, heart sounds normal, no murmur or gallops, no peripheral edema  Abdomen: soft and NT, no HSM,  BS normal  Musculoskeletal: No deformities, no cyanosis or clubbing  Neuro: alert, non focal  Skin: Warm, no lesions or rashes  All labs including recent laboratory data reviewed in epic all imaging studies also reviewed    Assessment & Plan:  I personally reviewed [facility] as well as any outside material available during this office visit and agree with the  radiology impressions.   ARDS (adult respiratory distress syndrome) (HCC) Severe ARDS with acute respiratory failure now resolved  Will obtain follow-up chest x-ray and overnight sleep oximetry.  Note in the office her saturations were over 98% at rest  Chronic respiratory failure with hypoxia (HCC) Chronic respiratory failure with hypoxia appears to be due to residual lung injury from severe ARDS and COVID pneumonia now is slowly improving  We will reassess oxygenation at night on room air  COVID-19 COVID-19 pneumonia now resolved and the patient now can be out of isolation and has been out of isolation since July  Elevated LFTs Elevated liver function secondary to viral pneumonia secondary to COVID now resolved  QT prolongation History of arrhythmias and QT prolongation the patient now is stable on the metoprolol at 50 mg twice daily per cardiology has follow-up echocardiogram  Tracheostomy status (HCC) Tracheostomy is now out and the wound is healing rapidly  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  Diagnoses and all orders for this visit:  COVID-19 -     Pulse oximetry, overnight; Future -     DG Chest 2 View; Future  Acute respiratory failure with hypoxemia (HCC) -     Pulse oximetry, overnight; Future -     DG Chest 2 View; Future  Tracheostomy status (HCC)  ARDS (adult respiratory distress syndrome) (HCC) -     DG Chest 2 View; Future  Chronic respiratory failure with hypoxia (HCC)  Elevated LFTs  QT prolongation  Critical illness myopathy

## 2019-01-05 ENCOUNTER — Encounter: Payer: Self-pay | Admitting: Critical Care Medicine

## 2019-01-05 ENCOUNTER — Other Ambulatory Visit: Payer: Self-pay

## 2019-01-05 ENCOUNTER — Ambulatory Visit: Payer: HRSA Program | Attending: Critical Care Medicine | Admitting: Critical Care Medicine

## 2019-01-05 VITALS — BP 149/91 | HR 67 | Temp 98.1°F | Ht 63.0 in

## 2019-01-05 DIAGNOSIS — J9601 Acute respiratory failure with hypoxia: Secondary | ICD-10-CM

## 2019-01-05 DIAGNOSIS — Z930 Tracheostomy status: Secondary | ICD-10-CM

## 2019-01-05 DIAGNOSIS — G7281 Critical illness myopathy: Secondary | ICD-10-CM

## 2019-01-05 DIAGNOSIS — J988 Other specified respiratory disorders: Secondary | ICD-10-CM | POA: Diagnosis not present

## 2019-01-05 DIAGNOSIS — U071 COVID-19: Secondary | ICD-10-CM | POA: Diagnosis not present

## 2019-01-05 DIAGNOSIS — J9611 Chronic respiratory failure with hypoxia: Secondary | ICD-10-CM

## 2019-01-05 DIAGNOSIS — R7989 Other specified abnormal findings of blood chemistry: Secondary | ICD-10-CM

## 2019-01-05 DIAGNOSIS — J80 Acute respiratory distress syndrome: Secondary | ICD-10-CM

## 2019-01-05 DIAGNOSIS — R9431 Abnormal electrocardiogram [ECG] [EKG]: Secondary | ICD-10-CM

## 2019-01-05 HISTORY — DX: Chronic respiratory failure with hypoxia: J96.11

## 2019-01-05 HISTORY — DX: Critical illness myopathy: G72.81

## 2019-01-05 NOTE — Assessment & Plan Note (Signed)
History of arrhythmias and QT prolongation the patient now is stable on the metoprolol at 50 mg twice daily per cardiology has follow-up echocardiogram

## 2019-01-05 NOTE — Assessment & Plan Note (Signed)
The patient had critical illness myopathy secondary to her prolonged mechanical ventilation and sedation and sepsis syndrome  The patient's rapidly improving her strength and balance and will need continued physical therapy

## 2019-01-05 NOTE — Assessment & Plan Note (Signed)
Tracheostomy is now out and the wound is healing rapidly

## 2019-01-05 NOTE — Assessment & Plan Note (Signed)
COVID-19 pneumonia now resolved and the patient now can be out of isolation and has been out of isolation since July

## 2019-01-05 NOTE — Assessment & Plan Note (Signed)
Severe ARDS with acute respiratory failure now resolved  Will obtain follow-up chest x-ray and overnight sleep oximetry.  Note in the office her saturations were over 98% at rest

## 2019-01-05 NOTE — Patient Instructions (Signed)
A chest xray will be obtained  An overnight sleep oxygen test on room air will be obtained  No medication changes  Return Dr Joya Gaskins 1 month

## 2019-01-05 NOTE — Assessment & Plan Note (Signed)
Elevated liver function secondary to viral pneumonia secondary to COVID now resolved

## 2019-01-05 NOTE — Assessment & Plan Note (Signed)
Chronic respiratory failure with hypoxia appears to be due to residual lung injury from severe ARDS and COVID pneumonia now is slowly improving  We will reassess oxygenation at night on room air

## 2019-01-06 ENCOUNTER — Telehealth: Payer: Self-pay

## 2019-01-06 NOTE — Telephone Encounter (Signed)
Order for overnight oximetry faxed to [facility] - fax # [phone]

## 2019-01-08 ENCOUNTER — Telehealth: Payer: Self-pay

## 2019-01-08 NOTE — Telephone Encounter (Signed)
Call placed to [place], spoke to [place] who confirmed that they received the order for ONO.  They are delivering the equipment to the patient 01/12/2019 and will pick up the next day and send results to ordering provider.

## 2019-02-06 ENCOUNTER — Ambulatory Visit: Payer: Self-pay | Admitting: Family Medicine

## 2019-02-13 ENCOUNTER — Ambulatory Visit: Payer: Self-pay | Admitting: Family Medicine

## 2019-02-13 ENCOUNTER — Other Ambulatory Visit: Payer: Self-pay

## 2019-02-15 NOTE — Progress Notes (Signed)
Subjective:    Patient ID: Shannon Meyer, female    DOB: 07/02/1965, 53 y.o.   MRN: [id number]  This is a very pleasant 53 year old female who was admitted between the May 20 and July 7 for severe respiratory failure and ARDS secondary to COVID-19 pneumonia.  This patient was intubated in the emergency room and brought [facility] where she had a very prolonged hospital course.  She was difficult to wean off the ventilator and ultimately underwent tracheostomy which was finally taken out on July 6 and she was sent home on July 7.  All of her rehab has progressed quite nicely.  She is on a regular diet she is breathing better she is still on the oxygen 2 L continuous.  She is no longer coughing.  She is markedly improved with her breathing.  Below is a copy of the discharge summary.  Note her patient's primary care physician wanted my review of her pulmonary status.  Admit Date: 10/22/2018 Discharge date: 12/09/2018 Diet recommendation:  Diet recommendations: Dysphagia 3 (mechanical soft);Thin liquid Liquids provided via: Cup;Straw Medication Administration: Whole meds with puree Supervision: Patient able to self feed;Intermittent supervision to cue for compensatory strategies Compensations: Slow rate;Small sips/bites;Follow solids with liquid;Multiple dry swallows after each bite/sip Postural Changes and/or Swallow Maneuvers: Seated upright 90 degrees  Brief Summary: See H&P, Labs, Consult and Test reports for all details in brief, Patient is a53 y.o.femalewith no significant past medical history-presented with shortness of breath-found to have acute hypoxic respiratory failure secondary to COVID-19 pneumonia-intubated in the emergency room and admitted to [facility]. She had a prolonged hospital course-she was difficult to wean off the ventilator and underwent tracheostomy. She also had pseudomonal pneumonia and was treated with antimicrobial therapy. She  continues to slowly improve-with decreasing FiO2 requirements, PCCM following for trach care.  Brief Hospital Course: Acute Hypoxic Resp Failure due to Covid 19 Viral pneumonia: Prolonged hospital course requiring prolonged ventilation and subsequent tracheostomy.  Treated with steroids, Remdesivir, and Actemra.  Over the past few days with stable oxygen requirements-clinically improving-PCCM followed closely and patient underwent decannulation of tracheostomy on 7/6.   COVID-19 Medications: Remdesivir>> 5/21 Actemra>> 5/20 and 5/21  Pseudomonas pneumonia:Improved-completed Fortaz-afebrile and not short of breath.  Acute encephalopathy/delirium: Resolved-completely awake and alert.   Brief run of PAF on 6/24:Remains in sinus rhythm-prior MD-Dr. Thedore MinsSingh discussed with Dr. Jory SimsSkains-continue aspirin and metoprolol-recommendations are for outpatient echocardiogram and outpatient cardiology follow-up.  Deconditioning/debility: PT following-recommendations were for CIR-but patient very anxious to go home-hence being discharged with maximum home health services.  PT followed up on 7/6 with recommendations for home health as well  Tongue laceration: Resolved-seems to have healed well-doubt any further follow-up required. Suspected to be injured during intubation.  Obesity  Procedures/Studies: ETT>> 5/20-6/11 Tracheostomy>> 6/11 Right arm PICC line>> 6/7  Discharge Diagnoses:  Principal Problem:   COVID-19 Active Problems:   Leukocytosis   Sepsis (HCC)   Elevated LFTs   QT prolongation   Acute respiratory failure with hypoxemia (HCC)   Pressure injury of skin   Tracheostomy status (HCC)   ARDS (adult respiratory distress syndrome) (HCC)   Tracheostomy care (HCC)  Note if she does exert herself significantly she will get out of breath otherwise her breathing is improved.  She takes the oxygen off if she is resting.  She is wearing it at night at 2 L.  She is receiving  physical therapy and still requires a walker and wheelchair.  She  had a significant critical illness neuropathy myopathy.  02/16/2019 The patient is seen today in follow-up and is markedly improved she is ambulating better and has been released from physical therapy at home she does not wear oxygen during the day and occasionally wears it at night.  An overnight sleep oximetry returned and showed significant hypoxemia 91 minutes over the night spent with 25 desaturation events per hour.  Also the patient may require a sleep study at some point. The patient still has home oxygen and she has been instructed to stay on this at 2 L bedtime only  The patient had very severe ARDS from coronavirus and has significantly improved at this point.  She is due a flu vaccine and tetanus vaccine and would benefit from a hepatitis C and HIV study Note her blood pressures ranging up despite being on metoprolol twice daily  The patient is also due a mammogram and colonoscopy   Past Medical History:  Diagnosis Date   Acute respiratory failure with hypoxemia (HCC)    ARDS (adult respiratory distress syndrome) (HCC)    COVID-19 10/22/2018   Known health problems: none    prior to hospitalization with COVID-19    Pressure injury of skin 11/07/2018   Tracheostomy status (HCC)      Family History  Problem Relation Age of Onset   Lung cancer Father    Dementia Father      Social History   Socioeconomic History   Marital status: Single    Spouse name: Not on file   Number of children: Not on file   Years of education: Not on file   Highest education level: Not on file  Occupational History   Not on file  Social Needs   Financial resource strain: Not on file   Food insecurity    Worry: Not on file    Inability: Not on file   Transportation needs    Medical: Not on file    Non-medical: Not on file  Tobacco Use   Smoking status: Never Smoker   Smokeless tobacco: Never Used    Substance and Sexual Activity   Alcohol use: Not Currently   Drug use: Not Currently   Sexual activity: Not Currently    Birth control/protection: None  Lifestyle   Physical activity    Days per week: Not on file    Minutes per session: Not on file   Stress: Not on file  Relationships   Social connections    Talks on phone: Not on file    Gets together: Not on file    Attends religious service: Not on file    Active member of club or organization: Not on file    Attends meetings of clubs or organizations: Not on file    Relationship status: Not on file   Intimate partner violence    Fear of current or ex partner: Not on file    Emotionally abused: Not on file    Physically abused: Not on file    Forced sexual activity: Not on file  Other Topics Concern   Not on file  Social History Narrative   Not on file     No Known Allergies   Outpatient Medications Prior to Visit  Medication Sig Dispense Refill   acetaminophen (TYLENOL) 325 MG tablet Take 650 mg by mouth every 6 (six) hours as needed for mild pain, moderate pain or fever.     aspirin EC 81 MG EC tablet Take 1 tablet (81  mg total) by mouth daily.     famotidine (PEPCID) 20 MG tablet Take 1 tablet (20 mg total) by mouth at bedtime. 30 tablet 5   metoprolol tartrate (LOPRESSOR) 50 MG tablet Take 1 tablet (50 mg total) by mouth 2 (two) times daily. 60 tablet 3   No facility-administered medications prior to visit.       Review of Systems Pos in bold Constitutional:   No  weight loss, night sweats,  Fevers, chills, fatigue, lassitude. HEENT:   No headaches,  Difficulty swallowing,  Tooth/dental problems,  Sore throat,                No sneezing, itching, ear ache, nasal congestion, post nasal drip,   CV:  No chest pain,  Orthopnea, PND, swelling in lower extremities, anasarca, dizziness, palpitations  GI  No heartburn, indigestion, abdominal pain, nausea, vomiting, diarrhea, change in bowel habits,  loss of appetite  Resp:  shortness of breath with exertion not at rest.  No excess mucus, no productive cough,  No non-productive cough,  No coughing up of blood.  No change in color of mucus.  No wheezing.  No chest wall deformity  Skin: no rash or lesions.  GU: no dysuria, change in color of urine, no urgency or frequency.  No flank pain.  MS:  No joint pain or swelling.  No decreased range of motion.  No back pain.  Psych:  No change in mood or affect. No depression or anxiety.  No memory loss.     Objective:   Physical Exam  Vitals:   02/16/19 1056  BP: (!) 152/95  Pulse: 96  Temp: 98.2 F (36.8 C)  TempSrc: Oral  SpO2: 95%  Weight: 200 lb 3.2 oz (90.8 kg)  Height: 5\' 3"  (1.6 m)    Gen: Pleasant, well-nourished, in no distress,  normal affect  ENT: No lesions,  mouth clear,  oropharynx clear, no postnasal drip  Neck: No JVD, no TMG, no carotid bruits, trach site is well-healed  Lungs: No use of accessory muscles, no dullness to percussion, clear without rales or rhonchi  Cardiovascular: RRR, heart sounds normal, no murmur or gallops, no peripheral edema  Abdomen: soft and NT, no HSM,  BS normal  Musculoskeletal: No deformities, no cyanosis or clubbing  Neuro: alert, non focal  Skin: Warm, no lesions or rashes  All labs including recent laboratory data reviewed in epic all imaging studies also reviewed  Overnight sleep oximetry test is reviewed and reveals significant desaturation 91+ minutes of the 8 hours of study This study occurred on August 19    Assessment & Plan:  I personally reviewed all images and lab data in the [facility] system as well as any outside material available during this office visit and agree with the  radiology impressions.   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 now off physical therapy  The patient will continue her exercise program as outlined at home  Essential hypertension Hypertension appears to be persisting despite being on Lopressor 50 mg twice daily  Plan will be for the patient to begin amlodipine 10 mg daily we will follow-up blood pressure readings   Diagnoses and all  orders for this visit:  Chronic respiratory failure with hypoxia ([place]) -     DG Chest 2 View; Future  Critical illness myopathy  Essential hypertension  Colon cancer screening -     Fecal occult blood, imunochemical  Encounter for screening for HIV -     HIV Antibody (routine testing w rflx)  Need for hepatitis C screening test -     Hepatitis c antibody (reflex)  Breast cancer screening -     MM DIGITAL SCREENING BILATERAL; Future  Need for immunization against influenza -     Flu Vaccine QUAD 6+ mos PF IM (Fluarix Quad PF)  Need for Tdap vaccination -     Tdap vaccine greater than or equal to 7yo IM  Other orders -     amLODipine (NORVASC) 10 MG tablet; Take 1 tablet (10 mg total) by mouth daily.   Will also obtain a hepatitis C assay along with HIV send a stool for occult blood and will also administer a tetanus vaccine and flu shot at this visit

## 2019-02-16 ENCOUNTER — Telehealth: Payer: Self-pay

## 2019-02-16 ENCOUNTER — Encounter: Payer: Self-pay | Admitting: Critical Care Medicine

## 2019-02-16 ENCOUNTER — Ambulatory Visit: Payer: Self-pay | Attending: Critical Care Medicine | Admitting: Critical Care Medicine

## 2019-02-16 ENCOUNTER — Other Ambulatory Visit: Payer: Self-pay

## 2019-02-16 VITALS — BP 152/95 | HR 96 | Temp 98.2°F | Ht 63.0 in | Wt 200.2 lb

## 2019-02-16 DIAGNOSIS — Z1239 Encounter for other screening for malignant neoplasm of breast: Secondary | ICD-10-CM

## 2019-02-16 DIAGNOSIS — Z114 Encounter for screening for human immunodeficiency virus [HIV]: Secondary | ICD-10-CM

## 2019-02-16 DIAGNOSIS — Z1159 Encounter for screening for other viral diseases: Secondary | ICD-10-CM

## 2019-02-16 DIAGNOSIS — Z1211 Encounter for screening for malignant neoplasm of colon: Secondary | ICD-10-CM

## 2019-02-16 DIAGNOSIS — J9611 Chronic respiratory failure with hypoxia: Secondary | ICD-10-CM

## 2019-02-16 DIAGNOSIS — G7281 Critical illness myopathy: Secondary | ICD-10-CM

## 2019-02-16 DIAGNOSIS — I10 Essential (primary) hypertension: Secondary | ICD-10-CM | POA: Insufficient documentation

## 2019-02-16 DIAGNOSIS — Z23 Encounter for immunization: Secondary | ICD-10-CM

## 2019-02-16 MED ORDER — AMLODIPINE BESYLATE 10 MG PO TABS: 10.0000 mg | ORAL_TABLET | Freq: Every day | ORAL | 3 refills | Status: DC

## 2019-02-16 NOTE — Assessment & Plan Note (Signed)
Chronic respiratory failure due to prior COVID-19 infection with acute lung injury now slowly improving however still requiring nocturnal oxygen therapy  Note the patient may benefit from a sleep study at some point but for now will maintain oxygen 2 L at bedtime

## 2019-02-16 NOTE — Telephone Encounter (Signed)
Call placed to [place], spoke to [place] and informed her that Dr Joya Gaskins has not received the results of the ONO.  She explained that there was data missing on the original study and it had be be repeated on 01/21/2019.  She stated that she would fax results to this CM today.

## 2019-02-16 NOTE — Patient Instructions (Signed)
Begin amlodipine 10 mg daily and stay on the metoprolol twice a day The amlodipine was sent to your [place] today include an HIV hepatitis C screening we will also give you a stool card to check for colon cancer  A mammogram will be scheduled  Flu shot and tetanus vaccine was given  Return to see Dr. Joya Gaskins in 2 months

## 2019-02-16 NOTE — Assessment & Plan Note (Signed)
Critical illness myopathy slowly improving now off physical therapy  The patient will continue her exercise program as outlined at home

## 2019-02-16 NOTE — Assessment & Plan Note (Signed)
Hypertension appears to be persisting despite being on Lopressor 50 mg twice daily  Plan will be for the patient to begin amlodipine 10 mg daily we will follow-up blood pressure readings

## 2019-02-17 LAB — HIV ANTIBODY (ROUTINE TESTING W REFLEX): HIV Screen 4th Generation wRfx: NONREACTIVE

## 2019-02-17 LAB — HCV COMMENT:

## 2019-02-17 LAB — HEPATITIS C ANTIBODY (REFLEX): HCV Ab: <0.1 s/co ratio (ref 0.0–0.9)

## 2019-03-17 ENCOUNTER — Other Ambulatory Visit (HOSPITAL_COMMUNITY): Payer: Self-pay | Admitting: *Deleted

## 2019-03-17 DIAGNOSIS — Z1231 Encounter for screening mammogram for malignant neoplasm of breast: Secondary | ICD-10-CM

## 2019-03-25 LAB — FECAL OCCULT BLOOD, IMMUNOCHEMICAL

## 2019-04-09 ENCOUNTER — Telehealth: Payer: Self-pay | Admitting: Family Medicine

## 2019-04-09 DIAGNOSIS — I480 Paroxysmal atrial fibrillation: Secondary | ICD-10-CM

## 2019-04-09 DIAGNOSIS — Z7982 Long term (current) use of aspirin: Secondary | ICD-10-CM

## 2019-04-09 MED ORDER — METOPROLOL TARTRATE 50 MG PO TABS: 50.0000 mg | ORAL_TABLET | Freq: Two times a day (BID) | ORAL | 2 refills | Status: DC

## 2019-04-09 MED ORDER — FAMOTIDINE 20 MG PO TABS: 20.0000 mg | ORAL_TABLET | Freq: Every day | ORAL | 2 refills | Status: DC

## 2019-04-09 NOTE — Telephone Encounter (Signed)
1) Medication(s) Requested (by name): Famotidine metoprolol 2) Pharmacy of Choice: [facility] on [address] [place] 3) Special Requests:   Approved medications will be sent to the pharmacy, we will reach out if there is an issue.  Requests made after 3pm may not be addressed until the following business day!  If a patient is unsure of the name of the medication(s) please note and ask patient to call back when they are able to provide all info, do not send to responsible party until all information is available!

## 2019-04-09 NOTE — Addendum Note (Signed)
Addended by: Daisy Blossom, Annie Main L on: 04/09/2019 03:57 PM   Modules accepted: Orders

## 2019-04-18 NOTE — Progress Notes (Deleted)
Subjective:    Patient ID: Shannon Meyer, female    DOB: August 20, 1965, 53 y.o.   MRN: [id number]  This is a very pleasant 53 year old female who was admitted between the May 20 and July 7 for severe respiratory failure and ARDS secondary to COVID-19 pneumonia.  This patient was intubated in the emergency room and brought over to the [facility] where she had a very prolonged hospital course.  She was difficult to wean off the ventilator and ultimately underwent tracheostomy which was finally taken out on July 6 and she was sent home on July 7.  All of her rehab has progressed quite nicely.  She is on a regular diet she is breathing better she is still on the oxygen 2 L continuous.  She is no longer coughing.  She is markedly improved with her breathing.  Below is a copy of the discharge summary.  Note her patient's primary care physician wanted my review of her pulmonary status.  Admit Date: 10/22/2018 Discharge date: 12/09/2018 Diet recommendation:  Diet recommendations: Dysphagia 3 (mechanical soft);Thin liquid Liquids provided via: Cup;Straw Medication Administration: Whole meds with puree Supervision: Patient able to self feed;Intermittent supervision to cue for compensatory strategies Compensations: Slow rate;Small sips/bites;Follow solids with liquid;Multiple dry swallows after each bite/sip Postural Changes and/or Swallow Maneuvers: Seated upright 90 degrees  Brief Summary: See H&P, Labs, Consult and Test reports for all details in brief, Patient is a53 y.o.femalewith no significant past medical history-presented with shortness of breath-found to have acute hypoxic respiratory failure secondary to COVID-19 pneumonia-intubated in the emergency room and admitted to [facility]. She had a prolonged hospital course-she was difficult to wean off the ventilator and underwent tracheostomy. She also had pseudomonal pneumonia and was treated with antimicrobial therapy. She  continues to slowly improve-with decreasing FiO2 requirements, PCCM following for trach care.  Brief Hospital Course: Acute Hypoxic Resp Failure due to Covid 19 Viral pneumonia: Prolonged hospital course requiring prolonged ventilation and subsequent tracheostomy.  Treated with steroids, Remdesivir, and Actemra.  Over the past few days with stable oxygen requirements-clinically improving-PCCM followed closely and patient underwent decannulation of tracheostomy on 7/6.   COVID-19 Medications: Remdesivir>> 5/21 Actemra>> 5/20 and 5/21  Pseudomonas pneumonia:Improved-completed Fortaz-afebrile and not short of breath.  Acute encephalopathy/delirium: Resolved-completely awake and alert.   Brief run of PAF on 6/24:Remains in sinus rhythm-prior MD-Dr. Thedore Mins discussed with Dr. Jory Sims aspirin and metoprolol-recommendations are for outpatient echocardiogram and outpatient cardiology follow-up.  Deconditioning/debility: PT following-recommendations were for CIR-but patient very anxious to go home-hence being discharged with maximum home health services.  PT followed up on 7/6 with recommendations for home health as well  Tongue laceration: Resolved-seems to have healed well-doubt any further follow-up required. Suspected to be injured during intubation.  Obesity  Procedures/Studies: ETT>> 5/20-6/11 Tracheostomy>> 6/11 Right arm PICC line>> 6/7  Discharge Diagnoses:  Principal Problem:   COVID-19 Active Problems:   Leukocytosis   Sepsis (HCC)   Elevated LFTs   QT prolongation   Acute respiratory failure with hypoxemia (HCC)   Pressure injury of skin   Tracheostomy status (HCC)   ARDS (adult respiratory distress syndrome) (HCC)   Tracheostomy care (HCC)  Note if she does exert herself significantly she will get out of breath otherwise her breathing is improved.  She takes the oxygen off if she is resting.  She is wearing it at night at 2 L.  She is receiving  physical therapy and still requires a walker and wheelchair.  She  had a significant critical illness neuropathy myopathy.  02/16/2019 The patient is seen today in follow-up and is markedly improved she is ambulating better and has been released from physical therapy at home she does not wear oxygen during the day and occasionally wears it at night.  An overnight sleep oximetry returned and showed significant hypoxemia 91 minutes over the night spent with 25 desaturation events per hour.  Also the patient may require a sleep study at some point. The patient still has home oxygen and she has been instructed to stay on this at 2 L bedtime only  The patient had very severe ARDS from coronavirus and has significantly improved at this point.  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 Acute respiratory failure with hypoxemia (HCC)   . ARDS (adult respiratory distress syndrome) (HCC)   . COVID-19 10/22/2018  . Known health problems: none    prior to hospitalization with  COVID-19   . Pressure injury of skin 11/07/2018  . Tracheostomy status (HCC)      Family History  Problem Relation Age of Onset  . Lung cancer Father   . Dementia Father      Social History   Socioeconomic History  . Marital status: Single    Spouse name: Not on file  . Number of children: Not on file  . Years of education: Not on file  . Highest education level: Not on file  Occupational History  . Not on file  Social Needs  . Financial resource strain: Not on file  . Food insecurity    Worry: Not on file    Inability: Not on file  . Transportation needs    Medical: Not on file    Non-medical: Not on file  Tobacco Use  . Smoking status: Never Smoker  . Smokeless tobacco: Never Used  Substance and Sexual Activity  . Alcohol use: Not Currently  . Drug use: Not Currently  . Sexual activity: Not Currently    Birth control/protection: None  Lifestyle  . Physical activity    Days per week: Not on file    Minutes per session: Not on file  . Stress: Not on file  Relationships  . Social Musician on phone: Not on file    Gets together: Not on file    Attends religious service: Not on file    Active member of club or organization: Not on file    Attends meetings of clubs or organizations: Not on file    Relationship status: Not on file  . Intimate  partner violence    Fear of current or ex partner: Not on file    Emotionally abused: Not on file    Physically abused: Not on file    Forced sexual activity: Not on file  Other Topics Concern  . Not on file  Social History Narrative  . Not on file     No Known Allergies   Outpatient Medications Prior to Visit  Medication Sig Dispense Refill  . acetaminophen (TYLENOL) 325 MG tablet Take 650 mg by mouth every 6 (six) hours as needed for mild pain, moderate pain or fever.    Marland Kitchen amLODipine (NORVASC) 10 MG tablet Take 1 tablet (10 mg total) by mouth daily. 90 tablet 3  . aspirin EC 81 MG EC tablet Take 1 tablet  (81 mg total) by mouth daily.    . famotidine (PEPCID) 20 MG tablet Take 1 tablet (20 mg total) by mouth at bedtime. 30 tablet 2  . metoprolol tartrate (LOPRESSOR) 50 MG tablet Take 1 tablet (50 mg total) by mouth 2 (two) times daily. 60 tablet 2   No facility-administered medications prior to visit.       Review of Systems Pos in bold Constitutional:   No  weight loss, night sweats,  Fevers, chills, fatigue, lassitude. HEENT:   No headaches,  Difficulty swallowing,  Tooth/dental problems,  Sore throat,                No sneezing, itching, ear ache, nasal congestion, post nasal drip,   CV:  No chest pain,  Orthopnea, PND, swelling in lower extremities, anasarca, dizziness, palpitations  GI  No heartburn, indigestion, abdominal pain, nausea, vomiting, diarrhea, change in bowel habits, loss of appetite  Resp:  shortness of breath with exertion not at rest.  No excess mucus, no productive cough,  No non-productive cough,  No coughing up of blood.  No change in color of mucus.  No wheezing.  No chest wall deformity  Skin: no rash or lesions.  GU: no dysuria, change in color of urine, no urgency or frequency.  No flank pain.  MS:  No joint pain or swelling.  No decreased range of motion.  No back pain.  Psych:  No change in mood or affect. No depression or anxiety.  No memory loss.     Objective:   Physical Exam  There were no vitals filed for this visit.  Gen: Pleasant, well-nourished, in no distress,  normal affect  ENT: No lesions,  mouth clear,  oropharynx clear, no postnasal drip  Neck: No JVD, no TMG, no carotid bruits, trach site is well-healed  Lungs: No use of accessory muscles, no dullness to percussion, clear without rales or rhonchi  Cardiovascular: RRR, heart sounds normal, no murmur or gallops, no peripheral edema  Abdomen: soft and NT, no HSM,  BS normal  Musculoskeletal: No deformities, no cyanosis or clubbing  Neuro: alert, non focal  Skin: Warm, no  lesions or rashes  All labs including recent laboratory data reviewed in epic all imaging studies also reviewed  Overnight sleep oximetry test is reviewed and reveals significant desaturation 91+ minutes of the 8 hours of study This study occurred on August 19    Assessment & Plan:  I personally reviewed all images and lab data in the [facility] system as well as any outside material available during this office visit and agree with the  radiology impressions.   No problem-specific Assessment & Plan notes found for this encounter.   There  are no diagnoses linked to this encounter. Will also obtain a hepatitis C assay along with HIV send a stool for occult blood and will also administer a tetanus vaccine and flu shot at this visit

## 2019-04-20 ENCOUNTER — Ambulatory Visit: Payer: Self-pay | Admitting: Critical Care Medicine

## 2019-04-26 NOTE — Progress Notes (Signed)
Subjective:    Patient ID: Shannon Meyer, female    DOB: August 20, 1965, 53 y.o.   MRN: [id number]  This is a very pleasant 53 year old female who was admitted between the May 20 and July 7 for severe respiratory failure and ARDS secondary to COVID-19 pneumonia.  This patient was intubated in the emergency room and brought over to the [facility] where she had a very prolonged hospital course.  She was difficult to wean off the ventilator and ultimately underwent tracheostomy which was finally taken out on July 6 and she was sent home on July 7.  All of her rehab has progressed quite nicely.  She is on a regular diet she is breathing better she is still on the oxygen 2 L continuous.  She is no longer coughing.  She is markedly improved with her breathing.  Below is a copy of the discharge summary.  Note her patient's primary care physician wanted my review of her pulmonary status.  Admit Date: 10/22/2018 Discharge date: 12/09/2018 Diet recommendation:  Diet recommendations: Dysphagia 3 (mechanical soft);Thin liquid Liquids provided via: Cup;Straw Medication Administration: Whole meds with puree Supervision: Patient able to self feed;Intermittent supervision to cue for compensatory strategies Compensations: Slow rate;Small sips/bites;Follow solids with liquid;Multiple dry swallows after each bite/sip Postural Changes and/or Swallow Maneuvers: Seated upright 90 degrees  Brief Summary: See H&P, Labs, Consult and Test reports for all details in brief, Patient is a53 y.o.femalewith no significant past medical history-presented with shortness of breath-found to have acute hypoxic respiratory failure secondary to COVID-19 pneumonia-intubated in the emergency room and admitted to [facility]. She had a prolonged hospital course-she was difficult to wean off the ventilator and underwent tracheostomy. She also had pseudomonal pneumonia and was treated with antimicrobial therapy. She  continues to slowly improve-with decreasing FiO2 requirements, PCCM following for trach care.  Brief Hospital Course: Acute Hypoxic Resp Failure due to Covid 19 Viral pneumonia: Prolonged hospital course requiring prolonged ventilation and subsequent tracheostomy.  Treated with steroids, Remdesivir, and Actemra.  Over the past few days with stable oxygen requirements-clinically improving-PCCM followed closely and patient underwent decannulation of tracheostomy on 7/6.   COVID-19 Medications: Remdesivir>> 5/21 Actemra>> 5/20 and 5/21  Pseudomonas pneumonia:Improved-completed Fortaz-afebrile and not short of breath.  Acute encephalopathy/delirium: Resolved-completely awake and alert.   Brief run of PAF on 6/24:Remains in sinus rhythm-prior MD-Dr. Thedore Mins discussed with Dr. Jory Sims aspirin and metoprolol-recommendations are for outpatient echocardiogram and outpatient cardiology follow-up.  Deconditioning/debility: PT following-recommendations were for CIR-but patient very anxious to go home-hence being discharged with maximum home health services.  PT followed up on 7/6 with recommendations for home health as well  Tongue laceration: Resolved-seems to have healed well-doubt any further follow-up required. Suspected to be injured during intubation.  Obesity  Procedures/Studies: ETT>> 5/20-6/11 Tracheostomy>> 6/11 Right arm PICC line>> 6/7  Discharge Diagnoses:  Principal Problem:   COVID-19 Active Problems:   Leukocytosis   Sepsis (HCC)   Elevated LFTs   QT prolongation   Acute respiratory failure with hypoxemia (HCC)   Pressure injury of skin   Tracheostomy status (HCC)   ARDS (adult respiratory distress syndrome) (HCC)   Tracheostomy care (HCC)  Note if she does exert herself significantly she will get out of breath otherwise her breathing is improved.  She takes the oxygen off if she is resting.  She is wearing it at night at 2 L.  She is receiving  physical therapy and still requires a walker and wheelchair.  She  had a significant critical illness neuropathy myopathy.  02/16/2019 The patient is seen today in follow-up and is markedly improved she is ambulating better and has been released from physical therapy at home she does not wear oxygen during the day and occasionally wears it at night.  An overnight sleep oximetry returned and showed significant hypoxemia 91 minutes over the night spent with 25 desaturation events per hour.  Also the patient may require a sleep study at some point. The patient still has home oxygen and she has been instructed to stay on this at 2 L bedtime only  The patient had very severe ARDS from coronavirus and has significantly improved at this point.  She is due a flu vaccine and tetanus vaccine and would benefit from a hepatitis C and HIV study Note her blood pressures ranging up despite being on metoprolol twice daily  The patient is also due a mammogram and colonoscopy  11/23: This patient seen return follow-up she still on nocturnal oxygen.  She does exhaust easily with minimal activity.  She has finished her physical therapy and is doing some of the exercises at home.  She states her right arm is the weakest. Patient is still on nocturnal oxygen  Past Medical History:  Diagnosis Date  . Acute respiratory failure with hypoxemia ([place])   . ARDS (adult respiratory distress syndrome) ([place])   . COVID-19 10/22/2018  . Known health problems: none    prior to hospitalization with COVID-19   . Pressure injury of skin 11/07/2018  . Tracheostomy status ([place])      Family History  Problem Relation Age of Onset  . Lung cancer Father   . Dementia Father      Social History   Socioeconomic History  . Marital status: Single    Spouse name: Not on file  . Number of children: Not on file  . Years of education: Not on file  . Highest education level: Not on file  Occupational History  . Not on file  Social  Needs  . Financial resource strain: Not on file  . Food insecurity    Worry: Not on file    Inability: Not on file  . Transportation needs    Medical: Not on file    Non-medical: Not on file  Tobacco Use  . Smoking status: Never Smoker  . Smokeless tobacco: Never Used  Substance and Sexual Activity  . Alcohol use: Not Currently  . Drug use: Not Currently  . Sexual activity: Not Currently    Birth control/protection: None  Lifestyle  . Physical activity    Days per week: Not on file    Minutes per session: Not on file  . Stress: Not on file  Relationships  . Social Herbalist on phone: Not on file    Gets together: Not on file    Attends religious service: Not on file    Active member of club or organization: Not on file    Attends meetings of clubs or organizations: Not on file    Relationship status: Not on file  . Intimate partner violence    Fear of current or ex partner: Not on file    Emotionally abused: Not on file    Physically abused: Not on file    Forced sexual activity: Not on file  Other Topics Concern  . Not on file  Social History Narrative  . Not on file     No Known Allergies  Outpatient Medications Prior to Visit  Medication Sig Dispense Refill  . acetaminophen (TYLENOL) 325 MG tablet Take 650 mg by mouth every 6 (six) hours as needed for mild pain, moderate pain or fever.    Marland Kitchen amLODipine (NORVASC) 10 MG tablet Take 1 tablet (10 mg total) by mouth daily. 90 tablet 3  . aspirin EC 81 MG EC tablet Take 1 tablet (81 mg total) by mouth daily.    . famotidine (PEPCID) 20 MG tablet Take 1 tablet (20 mg total) by mouth at bedtime. 30 tablet 2  . metoprolol tartrate (LOPRESSOR) 50 MG tablet Take 1 tablet (50 mg total) by mouth 2 (two) times daily. 60 tablet 2   No facility-administered medications prior to visit.       Review of Systems Pos in bold Constitutional:   No  weight loss, night sweats,  Fevers, chills, fatigue, lassitude. HEENT:    No headaches,  Difficulty swallowing,  Tooth/dental problems,  Sore throat,                No sneezing, itching, ear ache, nasal congestion, post nasal drip,   CV:  No chest pain,  Orthopnea, PND, swelling in lower extremities, anasarca, dizziness, palpitations  GI  No heartburn, indigestion, abdominal pain, nausea, vomiting, diarrhea, change in bowel habits, loss of appetite  Resp:  shortness of breath with exertion not at rest.  No excess mucus, no productive cough,  No non-productive cough,  No coughing up of blood.  No change in color of mucus.  No wheezing.  No chest wall deformity  Skin: no rash or lesions.  GU: no dysuria, change in color of urine, no urgency or frequency.  No flank pain.  MS:  No joint pain or swelling.  No decreased range of motion.  No back pain.  Psych:  No change in mood or affect. No depression or anxiety.  No memory loss.     Objective:   Physical Exam  Vitals:   04/27/19 1014  BP: 120/83  Pulse: 76  Temp: 98.6 F (37 C)  TempSrc: Oral  SpO2: 97%  Weight: 214 lb (97.1 kg)  Height: 5\' 3"  (1.6 m)    Gen: Pleasant, well-nourished, in no distress,  normal affect  ENT: No lesions,  mouth clear,  oropharynx clear, no postnasal drip The third molar is cracked in the left upper jaw with a cavity present but no abscess seen  Neck: No JVD, no TMG, no carotid bruits, trach site is well-healed  Lungs: No use of accessory muscles, no dullness to percussion, clear without rales or rhonchi  Cardiovascular: RRR, heart sounds normal, no murmur or gallops, no peripheral edema  Abdomen: soft and NT, no HSM,  BS normal  Musculoskeletal: No deformities, no cyanosis or clubbing  Neuro: alert, non focal  Skin: Warm, no lesions or rashes  All labs including recent laboratory data reviewed in epic all imaging studies also reviewed  Overnight sleep oximetry test is reviewed and reveals significant desaturation 91+ minutes of the 8 hours of study This study  occurred on August 19    Assessment & Plan:  I personally reviewed all images and lab data in the [facility] system as well as any outside material available during this office visit and agree with the  radiology impressions.   Chronic respiratory failure with hypoxia (HCC) Due to overnight sleep oxygen desaturation will continue 2 L at night but she can be off oxygen during the day   Critical illness myopathy  Ongoing critical illness myopathy for which the patient was encouraged to continue her exercises at home she may need another follow-up visit with physical therapy  The patient also has hoarseness from difficult intubations and may need a speech therapy referral  Dental caries Patient did complain of pain in the left upper molar  She has a dental cavity there was a cracked tooth and I gave the patient dental service recommendations   Gavin PoundDeborah was seen today for follow-up.  Diagnoses and all orders for this visit:  Chronic respiratory failure with hypoxia (HCC)  Dental caries  Critical illness myopathy  Essential hypertension   Will also obtain a hepatitis C assay along with HIV send a stool for occult blood and will also administer a tetanus vaccine and flu shot at this visit

## 2019-04-27 ENCOUNTER — Other Ambulatory Visit: Payer: Self-pay

## 2019-04-27 ENCOUNTER — Ambulatory Visit: Payer: Self-pay | Attending: Critical Care Medicine | Admitting: Critical Care Medicine

## 2019-04-27 ENCOUNTER — Encounter: Payer: Self-pay | Admitting: Critical Care Medicine

## 2019-04-27 VITALS — BP 120/83 | HR 76 | Temp 98.6°F | Ht 63.0 in | Wt 214.0 lb

## 2019-04-27 DIAGNOSIS — I10 Essential (primary) hypertension: Secondary | ICD-10-CM

## 2019-04-27 DIAGNOSIS — K029 Dental caries, unspecified: Secondary | ICD-10-CM | POA: Insufficient documentation

## 2019-04-27 DIAGNOSIS — J9611 Chronic respiratory failure with hypoxia: Secondary | ICD-10-CM

## 2019-04-27 DIAGNOSIS — G7281 Critical illness myopathy: Secondary | ICD-10-CM

## 2019-04-27 NOTE — Assessment & Plan Note (Signed)
Patient did complain of pain in the left upper molar  She has a dental cavity there was a cracked tooth and I gave the patient dental service recommendations

## 2019-04-27 NOTE — Patient Instructions (Signed)
No change in your medications  Take 3-4 Advil's once today for your tooth pain and then take 2 Advil's 3-4 times daily as needed for pain  We gave you a list of dentist to consider  Return in follow-up with a video visit with Dr. Joya Gaskins in 2 months

## 2019-04-27 NOTE — Assessment & Plan Note (Signed)
Ongoing critical illness myopathy for which the patient was encouraged to continue her exercises at home she may need another follow-up visit with physical therapy  The patient also has hoarseness from difficult intubations and may need a speech therapy referral

## 2019-04-27 NOTE — Assessment & Plan Note (Signed)
Due to overnight sleep oxygen desaturation will continue 2 L at night but she can be off oxygen during the day

## 2019-05-19 ENCOUNTER — Ambulatory Visit (HOSPITAL_COMMUNITY): Payer: Self-pay

## 2019-06-29 ENCOUNTER — Telehealth: Payer: Self-pay | Admitting: Critical Care Medicine

## 2019-06-30 ENCOUNTER — Telehealth: Payer: Self-pay | Admitting: Critical Care Medicine

## 2019-06-30 NOTE — Progress Notes (Deleted)
Subjective:    Patient ID: Shannon Meyer, female    DOB: August 20, 1965, 54 y.o.   MRN: [id number]  This is a very pleasant 54 year old female who was admitted between the May 20 and July 7 for severe respiratory failure and ARDS secondary to COVID-19 pneumonia.  This patient was intubated in the emergency room and brought over to the [facility] where she had a very prolonged hospital course.  She was difficult to wean off the ventilator and ultimately underwent tracheostomy which was finally taken out on July 6 and she was sent home on July 7.  All of her rehab has progressed quite nicely.  She is on a regular diet she is breathing better she is still on the oxygen 2 L continuous.  She is no longer coughing.  She is markedly improved with her breathing.  Below is a copy of the discharge summary.  Note her patient's primary care physician wanted my review of her pulmonary status.  Admit Date: 10/22/2018 Discharge date: 12/09/2018 Diet recommendation:  Diet recommendations: Dysphagia 3 (mechanical soft);Thin liquid Liquids provided via: Cup;Straw Medication Administration: Whole meds with puree Supervision: Patient able to self feed;Intermittent supervision to cue for compensatory strategies Compensations: Slow rate;Small sips/bites;Follow solids with liquid;Multiple dry swallows after each bite/sip Postural Changes and/or Swallow Maneuvers: Seated upright 90 degrees  Brief Summary: See H&P, Labs, Consult and Test reports for all details in brief, Patient is a53 y.o.femalewith no significant past medical history-presented with shortness of breath-found to have acute hypoxic respiratory failure secondary to COVID-19 pneumonia-intubated in the emergency room and admitted to [facility]. She had a prolonged hospital course-she was difficult to wean off the ventilator and underwent tracheostomy. She also had pseudomonal pneumonia and was treated with antimicrobial therapy. She  continues to slowly improve-with decreasing FiO2 requirements, PCCM following for trach care.  Brief Hospital Course: Acute Hypoxic Resp Failure due to Covid 19 Viral pneumonia: Prolonged hospital course requiring prolonged ventilation and subsequent tracheostomy.  Treated with steroids, Remdesivir, and Actemra.  Over the past few days with stable oxygen requirements-clinically improving-PCCM followed closely and patient underwent decannulation of tracheostomy on 7/6.   COVID-19 Medications: Remdesivir>> 5/21 Actemra>> 5/20 and 5/21  Pseudomonas pneumonia:Improved-completed Fortaz-afebrile and not short of breath.  Acute encephalopathy/delirium: Resolved-completely awake and alert.   Brief run of PAF on 6/24:Remains in sinus rhythm-prior MD-Dr. Thedore Mins discussed with Dr. Jory Sims aspirin and metoprolol-recommendations are for outpatient echocardiogram and outpatient cardiology follow-up.  Deconditioning/debility: PT following-recommendations were for CIR-but patient very anxious to go home-hence being discharged with maximum home health services.  PT followed up on 7/6 with recommendations for home health as well  Tongue laceration: Resolved-seems to have healed well-doubt any further follow-up required. Suspected to be injured during intubation.  Obesity  Procedures/Studies: ETT>> 5/20-6/11 Tracheostomy>> 6/11 Right arm PICC line>> 6/7  Discharge Diagnoses:  Principal Problem:   COVID-19 Active Problems:   Leukocytosis   Sepsis (HCC)   Elevated LFTs   QT prolongation   Acute respiratory failure with hypoxemia (HCC)   Pressure injury of skin   Tracheostomy status (HCC)   ARDS (adult respiratory distress syndrome) (HCC)   Tracheostomy care (HCC)  Note if she does exert herself significantly she will get out of breath otherwise her breathing is improved.  She takes the oxygen off if she is resting.  She is wearing it at night at 2 L.  She is receiving  physical therapy and still requires a walker and wheelchair.  She  had a significant critical illness neuropathy myopathy.  02/16/2019 The patient is seen today in follow-up and is markedly improved she is ambulating better and has been released from physical therapy at home she does not wear oxygen during the day and occasionally wears it at night.  An overnight sleep oximetry returned and showed significant hypoxemia 91 minutes over the night spent with 25 desaturation events per hour.  Also the patient may require a sleep study at some point. The patient still has home oxygen and she has been instructed to stay on this at 2 L bedtime only  The patient had very severe ARDS from coronavirus and has significantly improved at this point.  She is due a flu vaccine and tetanus vaccine and would benefit from a hepatitis C and HIV study Note her blood pressures ranging up despite being on metoprolol twice daily  The patient is also due a mammogram and colonoscopy  11/23: This patient seen return follow-up she still on nocturnal oxygen.  She does exhaust easily with minimal activity.  She has finished her physical therapy and is doing some of the exercises at home.  She states her right arm is the weakest. 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 Acute respiratory failure with hypoxemia (HCC)   . ARDS (adult respiratory distress syndrome) (HCC)   . COVID-19 10/22/2018  . Known health problems: none    prior to hospitalization with COVID-19   . Pressure injury of skin 11/07/2018  . Tracheostomy status (HCC)      Family History  Problem Relation Age of Onset  . Lung cancer Father   . Dementia Father      Social History   Socioeconomic History  . Marital status: Single    Spouse name: Not on file  . Number of children: Not on file  . Years of education: Not on file  . Highest education level: Not on file  Occupational History  . Not on file  Tobacco Use  . Smoking status: Never Smoker  . Smokeless tobacco: Never Used  Substance and Sexual Activity  . Alcohol use: Not Currently  . Drug use: Not Currently  . Sexual activity: Not Currently    Birth control/protection: None  Other Topics Concern  . Not on file  Social History Narrative  . Not on file   Social Determinants of Health   Financial Resource Strain:   . Difficulty of Paying Living Expenses: Not on file  Food Insecurity:   . Worried About Programme researcher, broadcasting/film/video in the Last Year: Not on file  . Ran Out of Food in the Last Year: Not on file  Transportation Needs:   . Lack of Transportation (Medical): Not on file  . Lack of Transportation (Non-Medical): Not on file  Physical Activity:   . Days  of Exercise per Week: Not on file  . Minutes of Exercise per Session: Not on file  Stress:   . Feeling of Stress : Not on file  Social Connections:   . Frequency of Communication with Friends and Family: Not on file  . Frequency of Social Gatherings with Friends and Family: Not on file  . Attends Religious Services: Not on file  . Active Member of Clubs or Organizations: Not on file  . Attends Archivist Meetings: Not on file  . Marital Status: Not on file  Intimate Partner Violence:   . Fear of Current or Ex-Partner: Not on file  .  Emotionally Abused: Not on file  . Physically Abused: Not on file  . Sexually Abused: Not on file     No Known Allergies   Outpatient Medications Prior to Visit  Medication Sig Dispense Refill  . acetaminophen (TYLENOL) 325 MG tablet Take 650 mg by mouth every 6 (six) hours as needed for mild pain, moderate pain or fever.    Marland Kitchen amLODipine (NORVASC) 10 MG tablet Take 1 tablet (10 mg total) by mouth daily. 90 tablet 3  . aspirin EC 81 MG EC tablet Take 1 tablet (81 mg total) by mouth daily.    . famotidine (PEPCID) 20 MG tablet Take 1 tablet (20 mg total) by mouth at bedtime. 30 tablet 2  . metoprolol tartrate (LOPRESSOR) 50 MG tablet Take 1 tablet (50 mg total) by mouth 2 (two) times daily. 60 tablet 2   No facility-administered medications prior to visit.      Review of Systems Pos in bold Constitutional:   No  weight loss, night sweats,  Fevers, chills, fatigue, lassitude. HEENT:   No headaches,  Difficulty swallowing,  Tooth/dental problems,  Sore throat,                No sneezing, itching, ear ache, nasal congestion, post nasal drip,   CV:  No chest pain,  Orthopnea, PND, swelling in lower extremities, anasarca, dizziness, palpitations  GI  No heartburn, indigestion, abdominal pain, nausea, vomiting, diarrhea, change in bowel habits, loss of appetite  Resp:  shortness of breath with exertion not at rest.  No excess mucus, no productive cough,  No non-productive cough,  No coughing up of blood.  No change in color of mucus.  No wheezing.  No chest wall deformity  Skin: no rash or lesions.  GU: no dysuria, change in color of urine, no urgency or frequency.  No flank pain.  MS:  No joint pain or swelling.  No decreased range of motion.  No back pain.  Psych:  No change in mood or affect. No depression or anxiety.  No memory loss.     Objective:   Physical Exam  There were no vitals filed for this visit.  Gen: Pleasant, well-nourished, in no distress,  normal  affect  ENT: No lesions,  mouth clear,  oropharynx clear, no postnasal drip The third molar is cracked in the left upper jaw with a cavity present but no abscess seen  Neck: No JVD, no TMG, no carotid bruits, trach site is well-healed  Lungs: No use of accessory muscles, no dullness to percussion, clear without rales or rhonchi  Cardiovascular: RRR, heart sounds normal, no murmur or gallops, no peripheral edema  Abdomen: soft and NT, no HSM,  BS normal  Musculoskeletal: No deformities, no cyanosis or clubbing  Neuro: alert, non focal  Skin: Warm, no lesions or rashes  All  labs including recent laboratory data reviewed in epic all imaging studies also reviewed  Overnight sleep oximetry test is reviewed and reveals significant desaturation 91+ minutes of the 8 hours of study This study occurred on August 19    Assessment & Plan:  I personally reviewed all images and lab data in the [facility] system as well as any outside material available during this office visit and agree with the  radiology impressions.   No problem-specific Assessment & Plan notes found for this encounter.   There are no diagnoses linked to this encounter. Will also obtain a hepatitis C assay along with HIV send a stool for occult blood and will also administer a tetanus vaccine and flu shot at this visit

## 2019-08-06 ENCOUNTER — Encounter: Payer: Self-pay | Admitting: Critical Care Medicine

## 2019-08-06 ENCOUNTER — Other Ambulatory Visit: Payer: Self-pay

## 2019-08-06 ENCOUNTER — Ambulatory Visit: Payer: Self-pay | Attending: Critical Care Medicine | Admitting: Critical Care Medicine

## 2019-08-06 DIAGNOSIS — J9611 Chronic respiratory failure with hypoxia: Secondary | ICD-10-CM

## 2019-08-06 DIAGNOSIS — K219 Gastro-esophageal reflux disease without esophagitis: Secondary | ICD-10-CM | POA: Insufficient documentation

## 2019-08-06 DIAGNOSIS — Z7982 Long term (current) use of aspirin: Secondary | ICD-10-CM | POA: Insufficient documentation

## 2019-08-06 DIAGNOSIS — I4891 Unspecified atrial fibrillation: Secondary | ICD-10-CM | POA: Insufficient documentation

## 2019-08-06 DIAGNOSIS — I480 Paroxysmal atrial fibrillation: Secondary | ICD-10-CM

## 2019-08-06 DIAGNOSIS — I10 Essential (primary) hypertension: Secondary | ICD-10-CM

## 2019-08-06 DIAGNOSIS — G7281 Critical illness myopathy: Secondary | ICD-10-CM

## 2019-08-06 MED ORDER — ASPIRIN 81 MG PO TBEC: 81.0000 mg | DELAYED_RELEASE_TABLET | Freq: Every day | ORAL | 3 refills | Status: AC

## 2019-08-06 MED ORDER — FAMOTIDINE 40 MG PO TABS: 40.0000 mg | ORAL_TABLET | Freq: Every day | ORAL | 3 refills | Status: AC

## 2019-08-06 MED ORDER — METOPROLOL TARTRATE 50 MG PO TABS: 50.0000 mg | ORAL_TABLET | Freq: Two times a day (BID) | ORAL | 2 refills | Status: DC

## 2019-08-06 MED ORDER — AMLODIPINE BESYLATE 10 MG PO TABS: 10.0000 mg | ORAL_TABLET | Freq: Every day | ORAL | 3 refills | Status: AC

## 2019-08-06 NOTE — Patient Instructions (Signed)

## 2019-08-06 NOTE — Progress Notes (Signed)
Patient has been called and DOB has been verified. Patient has been screened and transferred to PCP to start phone visit.     

## 2019-08-06 NOTE — Progress Notes (Signed)
Subjective:    Patient ID: Shannon Meyer, female    DOB: Mar 11, 1966, 54 y.o.   MRN: [id number] Virtual Visit via Telephone Note  I connected with Shannon Meyer on 08/07/19 at  2:30 PM EST by telephone and verified that I am speaking with the correct person using two identifiers.   Consent:  I discussed the limitations, risks, security and privacy concerns of performing an evaluation and management service by telephone and the availability of in person appointments. I also discussed with the patient that there may be a patient responsible charge related to this service. The patient expressed understanding and agreed to proceed.  Location of patient: The patient was at home with her daughter Shannon Meyer  Location of provider: I was in my office  Persons participating in the televisit with the patient.   The patient's daughter Shannon Meyer participated in the call with the patient    History of Present Illness:  This is a very pleasant 54 year old female who was admitted between the May 20 and July 7 for severe respiratory failure and ARDS secondary to COVID-19 pneumonia.  This patient was intubated in the emergency room and brought over to the [facility] where she had a very prolonged hospital course.  She was difficult to wean off the ventilator and ultimately underwent tracheostomy which was finally taken out on July 6 and she was sent home on July 7.  All of her rehab has progressed quite nicely.  She is on a regular diet she is breathing better she is still on the oxygen 2 L continuous.  She is no longer coughing.  She is markedly improved with her breathing.  Below is a copy of the discharge summary.  Note her patient's primary care physician wanted my review of her pulmonary status.  Admit Date: 10/22/2018 Discharge date: 12/09/2018 Diet recommendation:  Diet recommendations: Dysphagia 3 (mechanical soft);Thin liquid Liquids provided via: Cup;Straw Medication Administration: Whole  meds with puree Supervision: Patient able to self feed;Intermittent supervision to cue for compensatory strategies Compensations: Slow rate;Small sips/bites;Follow solids with liquid;Multiple dry swallows after each bite/sip Postural Changes and/or Swallow Maneuvers: Seated upright 90 degrees  Brief Summary: See H&P, Labs, Consult and Test reports for all details in brief, Patient is a53 y.o.femalewith no significant past medical history-presented with shortness of breath-found to have acute hypoxic respiratory failure secondary to COVID-19 pneumonia-intubated in the emergency room and admitted to [facility]. She had a prolonged hospital course-she was difficult to wean off the ventilator and underwent tracheostomy. She also had pseudomonal pneumonia and was treated with antimicrobial therapy. She continues to slowly improve-with decreasing FiO2 requirements, PCCM following for trach care.  Brief Hospital Course: Acute Hypoxic Resp Failure due to Covid 19 Viral pneumonia: Prolonged hospital course requiring prolonged ventilation and subsequent tracheostomy.  Treated with steroids, Remdesivir, and Actemra.  Over the past few days with stable oxygen requirements-clinically improving-PCCM followed closely and patient underwent decannulation of tracheostomy on 7/6.   COVID-19 Medications: Remdesivir>> 5/21 Actemra>> 5/20 and 5/21  Pseudomonas pneumonia:Improved-completed Fortaz-afebrile and not short of breath.  Acute encephalopathy/delirium: Resolved-completely awake and alert.   Brief run of PAF on 6/24:Remains in sinus rhythm-prior MD-Dr. Thedore Meyer discussed with Dr. Jory Meyer aspirin and metoprolol-recommendations are for outpatient echocardiogram and outpatient cardiology follow-up.  Deconditioning/debility: PT following-recommendations were for CIR-but patient very anxious to go home-hence being discharged with maximum home health services.  PT followed up on 7/6  with recommendations for home health as well  Tongue laceration: Resolved-seems  to have healed well-doubt any further follow-up required. Suspected to be injured during intubation.  Obesity  Procedures/Studies: ETT>> 5/20-6/11 Tracheostomy>> 6/11 Right arm PICC line>> 6/7  Discharge Diagnoses:  Principal Problem:   COVID-19 Active Problems:   Leukocytosis   Sepsis (HCC)   Elevated LFTs   QT prolongation   Acute respiratory failure with hypoxemia (HCC)   Pressure injury of skin   Tracheostomy status (HCC)   ARDS (adult respiratory distress syndrome) (HCC)   Tracheostomy care (HCC)  Note if she does exert herself significantly she will get out of breath otherwise her breathing is improved.  She takes the oxygen off if she is resting.  She is wearing it at night at 2 L.  She is receiving physical therapy and still requires a walker and wheelchair.  She had a significant critical illness neuropathy myopathy.  02/16/2019 The patient is seen today in follow-up and is markedly improved she is ambulating better and has been released from physical therapy at home she does not wear oxygen during the day and occasionally wears it at night.  An overnight sleep oximetry returned and showed significant hypoxemia 91 minutes over the night spent with 25 desaturation events per hour.  Also the patient may require a sleep study at some point. The patient still has home oxygen and she has been instructed to stay on this at 2 L bedtime only  The patient had very severe ARDS from coronavirus and has significantly improved at this point.  She is due a flu vaccine and tetanus vaccine and would benefit from a hepatitis C and HIV study Note her blood pressures ranging up despite being on metoprolol twice daily  The patient is also due a mammogram and colonoscopy  11/23: This patient seen return follow-up she still on nocturnal oxygen.  She does exhaust easily with minimal activity.  She has finished  her physical therapy and is doing some of the exercises at home.  She states her right arm is the weakest. Patient is still on nocturnal oxygen  08/06/2019 This is a telephone visit follow-up for this 54 year old female who suffered severe Covid pneumonia and respiratory failure in May 2020.  The patient has had a post Covid syndrome with chronic hypoxemia that is gradually resolving.  Last chest x-ray showed improvement in clearance of infiltrates.  She is now off nocturnal oxygen therapy.  She is doing well with this.  She swallows well.  She did have a tracheostomy this is now out.  She has no cough or mucus production.  She does have severe reflux and heartburn and has not been compliant with her reflux diet.  Her arm strength is better.  She has no chest pain or wheezing.  She has some hoarseness.  She is requesting a handicap placard for her car.  She still has difficulty with balance but has not fallen.  Past Medical History:  Diagnosis Date  . Acute respiratory failure with hypoxemia (HCC)   . ARDS (adult respiratory distress syndrome) (HCC)   . Chronic respiratory failure with hypoxia (HCC) 01/05/2019   ONO 01/21/19:  Desaturation out of 8 hours study  . COVID-19 10/22/2018  . 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 Known health problems: none    prior to hospitalization with COVID-19   . Pressure injury of skin 11/07/2018  . Tracheostomy status (HCC)      Family History  Problem Relation Age of Onset  . Lung cancer Father   . Dementia Father  Social History   Socioeconomic History  . Marital status: Single    Spouse name: Not on file  . Number of children: Not on file  . Years of education: Not on file  . Highest education level: Not on file  Occupational History  . Not on file  Tobacco Use  . Smoking status: Never Smoker  . Smokeless tobacco: Never Used  Substance and Sexual Activity  . Alcohol use: Not Currently  . Drug use: Not Currently  . Sexual activity: Not  Currently    Birth control/protection: None  Other Topics Concern  . Not on file  Social History Narrative  . Not on file   Social Determinants of Health   Financial Resource Strain:   . Difficulty of Paying Living Expenses: Not on file  Food Insecurity:   . Worried About Charity fundraiser in the Last Year: Not on file  . Ran Out of Food in the Last Year: Not on file  Transportation Needs:   . Lack of Transportation (Medical): Not on file  . Lack of Transportation (Non-Medical): Not on file  Physical Activity:   . Days of Exercise per Week: Not on file  . Minutes of Exercise per Session: Not on file  Stress:   . Feeling of Stress : Not on file  Social Connections:   . Frequency of Communication with Friends and Family: Not on file  . Frequency of Social Gatherings with Friends and Family: Not on file  . Attends Religious Services: Not on file  . Active Member of Clubs or Organizations: Not on file  . Attends Archivist Meetings: Not on file  . Marital Status: Not on file  Intimate Partner Violence:   . Fear of Current or Ex-Partner: Not on file  . Emotionally Abused: Not on file  . Physically Abused: Not on file  . Sexually Abused: Not on file     No Known Allergies   Outpatient Medications Prior to Visit  Medication Sig Dispense Refill  . acetaminophen (TYLENOL) 325 MG tablet Take 650 mg by mouth every 6 (six) hours as needed for mild pain, moderate pain or fever.    Marland Kitchen amLODipine (NORVASC) 10 MG tablet Take 1 tablet (10 mg total) by mouth daily. 90 tablet 3  . aspirin EC 81 MG EC tablet Take 1 tablet (81 mg total) by mouth daily.    . famotidine (PEPCID) 20 MG tablet Take 1 tablet (20 mg total) by mouth at bedtime. 30 tablet 2  . metoprolol tartrate (LOPRESSOR) 50 MG tablet Take 1 tablet (50 mg total) by mouth 2 (two) times daily. 60 tablet 2   No facility-administered medications prior to visit.      Review of Systems  Pos in bold Constitutional:    No  weight loss, night sweats,  Fevers, chills, fatigue, lassitude. HEENT:   No headaches,  Difficulty swallowing,  Tooth/dental problems,  Sore throat,                No sneezing, itching, ear ache, nasal congestion, post nasal drip,   CV:  No chest pain,  Orthopnea, PND, swelling in lower extremities, anasarca, dizziness, palpitations  GI   heartburn, indigestion, abdominal pain, nausea, vomiting, diarrhea, change in bowel habits, loss of appetite  Resp:  shortness of breath with exertion not at rest.  No excess mucus, no productive cough,  No non-productive cough,  No coughing up of blood.  No change in color of mucus.  No  wheezing.  No chest wall deformity  Skin: no rash or lesions.  GU: no dysuria, change in color of urine, no urgency or frequency.  No flank pain.  MS:  No joint pain or swelling.  No decreased range of motion.  No back pain.  Psych:  No change in mood or affect. No depression or anxiety.  No memory loss.     Objective:   Physical Exam   There were no vitals filed for this visit. All labs including recent laboratory data reviewed in epic all imaging studies also reviewed     Assessment & Plan:  I personally reviewed all images and lab data in the [facility] system as well as any outside material available during this office visit and agree with the  radiology impressions.   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  Diagnoses and all orders for this visit:  Chronic respiratory failure with hypoxia (HCC)  Atrial fibrillation, unspecified type (HCC)  Long term (current) use of aspirin -     famotidine (PEPCID) 40 MG tablet; Take 1 tablet (40 mg total) by mouth at bedtime.  Paroxysmal atrial fibrillation (HCC) -     metoprolol tartrate (LOPRESSOR) 50 MG tablet; Take 1 tablet (50 mg total) by mouth 2 (two) times daily.  Gastroesophageal reflux disease without esophagitis  Critical illness myopathy  Essential hypertension  Other orders -     amLODipine (NORVASC) 10 MG tablet; Take 1 tablet (10 mg total) by mouth daily. -     aspirin 81 MG EC tablet; Take 1 tablet (81 mg total) by mouth daily.   Follow Up Instructions:   The patient knows a follow-up phone visit will be made in May of this year  I discussed the assessment and treatment plan with the patient. The patient was provided an opportunity to ask questions and all were answered. The patient agreed with the plan and demonstrated an understanding of the instructions.   The patient was advised to call back or seek an in-person evaluation if the symptoms worsen or if the condition fails to improve as anticipated.  I provided 30 minutes of non-face-to-face time during this encounter  including  median intraservice time , review of notes, labs, imaging, medications  and explaining diagnosis and management to the patient .    Shan Levans, MD

## 2019-08-07 ENCOUNTER — Encounter: Payer: Self-pay | Admitting: Critical Care Medicine

## 2019-08-07 NOTE — Assessment & Plan Note (Signed)
Hypertension has been under good control  Continue amlodipine and metoprololFor which refills were issued to the pharmacy

## 2019-08-07 NOTE — Assessment & Plan Note (Signed)
Significant gastroesophageal reflux disease  Plan is increase Pepcid 40 mg daily and I went over the patient a reflux diet and emailed her a copy of this

## 2019-08-07 NOTE — Assessment & Plan Note (Signed)
Chronic respiratory failure particular with nocturnal hypoxemia now resolved  This is secondary to COVID-19 infection and pneumonia  No further oxygen therapy and the oxygen has been taken out of the home

## 2019-08-07 NOTE — Assessment & Plan Note (Signed)
Critical illness myopathy now resolving status post Covid infection

## 2020-02-02 ENCOUNTER — Other Ambulatory Visit: Payer: Self-pay | Admitting: Critical Care Medicine

## 2020-02-02 DIAGNOSIS — I480 Paroxysmal atrial fibrillation: Secondary | ICD-10-CM

## 2020-10-24 IMAGING — DX PORTABLE CHEST - 1 VIEW
1 series · 2 of 2 positions shown · non-contrast
Comparison: 10/22/2018

CLINICAL DATA: Tube placement

EXAM:
PORTABLE CHEST 1 VIEW

[Series 2: chest ap · 0.14mm/px · 2 of 2 slices shown]
[im 1/2]
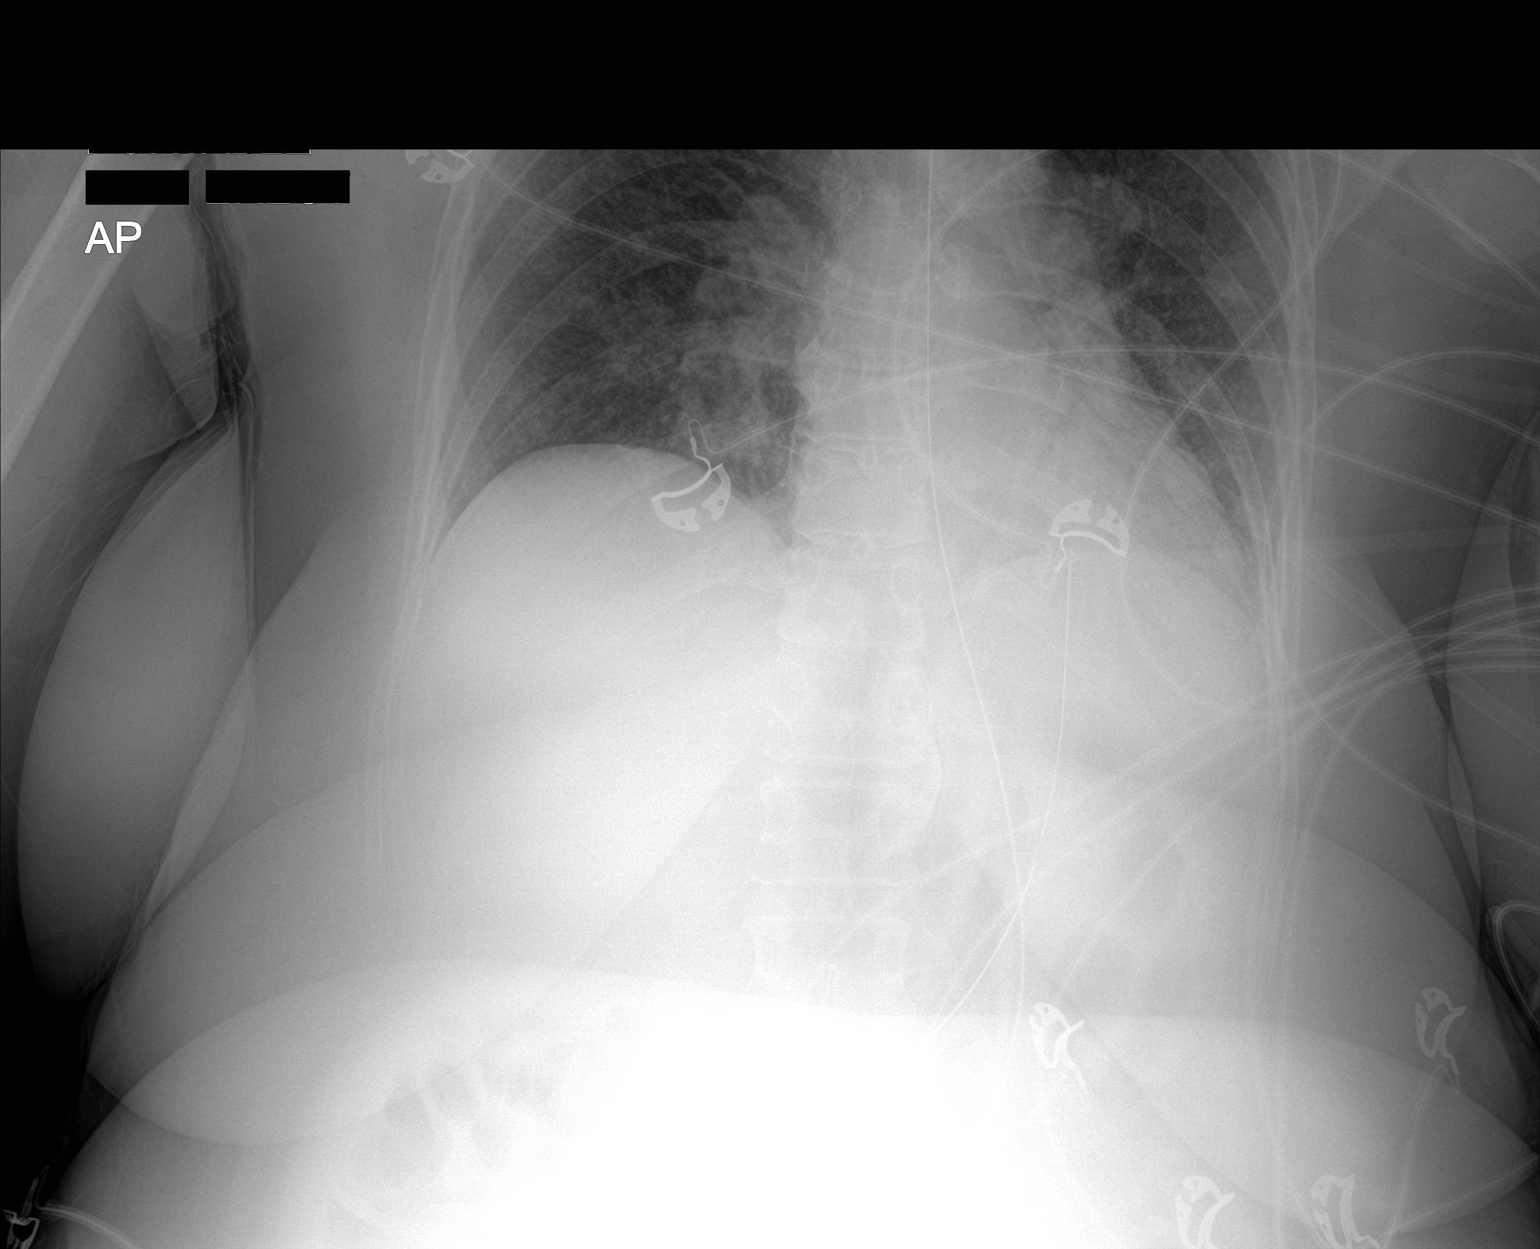
[im 2/2]
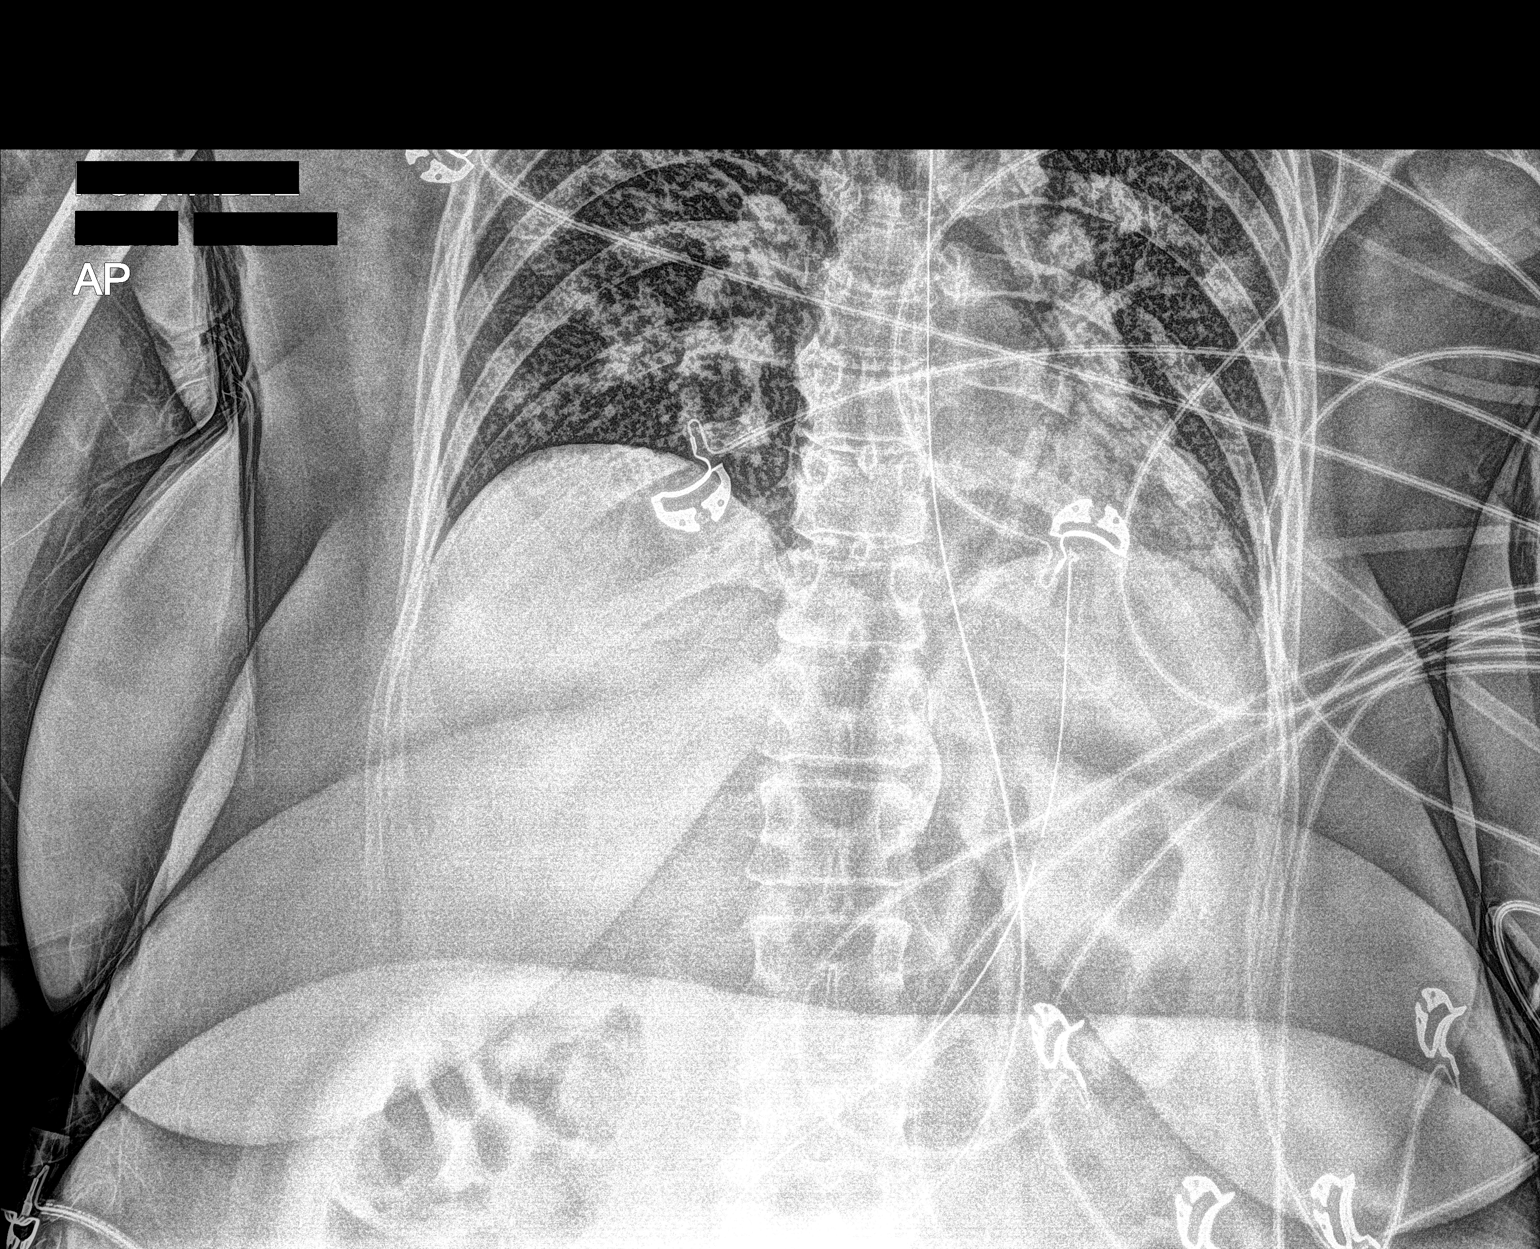

[2 of 2 positions shown; findings below may reference images not displayed]

FINDINGS: The endotracheal tube terminates above the carina by approximately
2.7 cm. The enteric tube extends below the left hemidiaphragm. The
cardiac silhouette is mildly enlarged. There are increasing
perihilar airspace opacities and prominent interstitial lung
markings. There is a worsening retrocardiac/left basilar airspace
opacity.
IMPRESSION: 1. Lines and tubes as above.
2. Worsening perihilar airspace opacities and prominent interstitial
lung markings can be seen in patients with atypical pneumonia.
3. Worsening retrocardiac/left basilar airspace opacity concerning
for aspiration or developing consolidation.

## 2020-10-24 IMAGING — DX PORTABLE CHEST - 1 VIEW
2 series · 2 of 2 positions shown · non-contrast
Comparison: None.

CLINICAL DATA: Fever, cough.

EXAM:
PORTABLE CHEST 1 VIEW

[chest ap (1 of 2)]
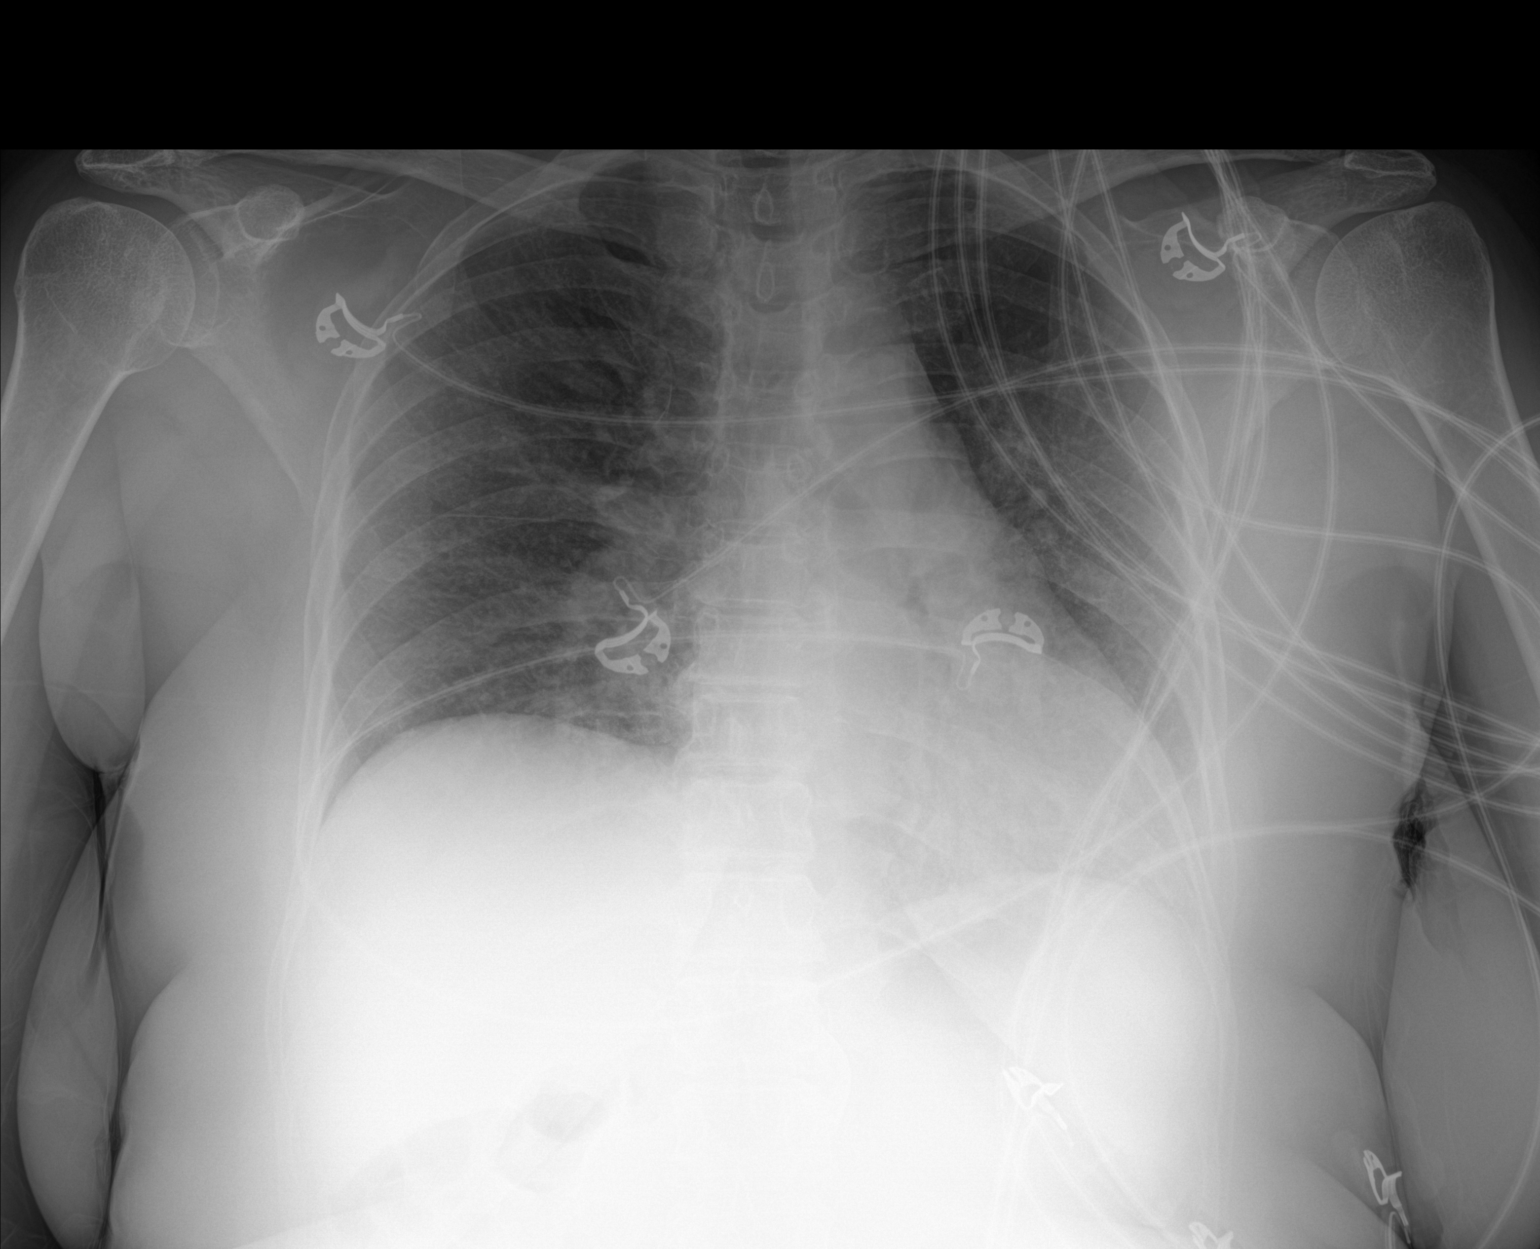

[chest ap (2 of 2)]
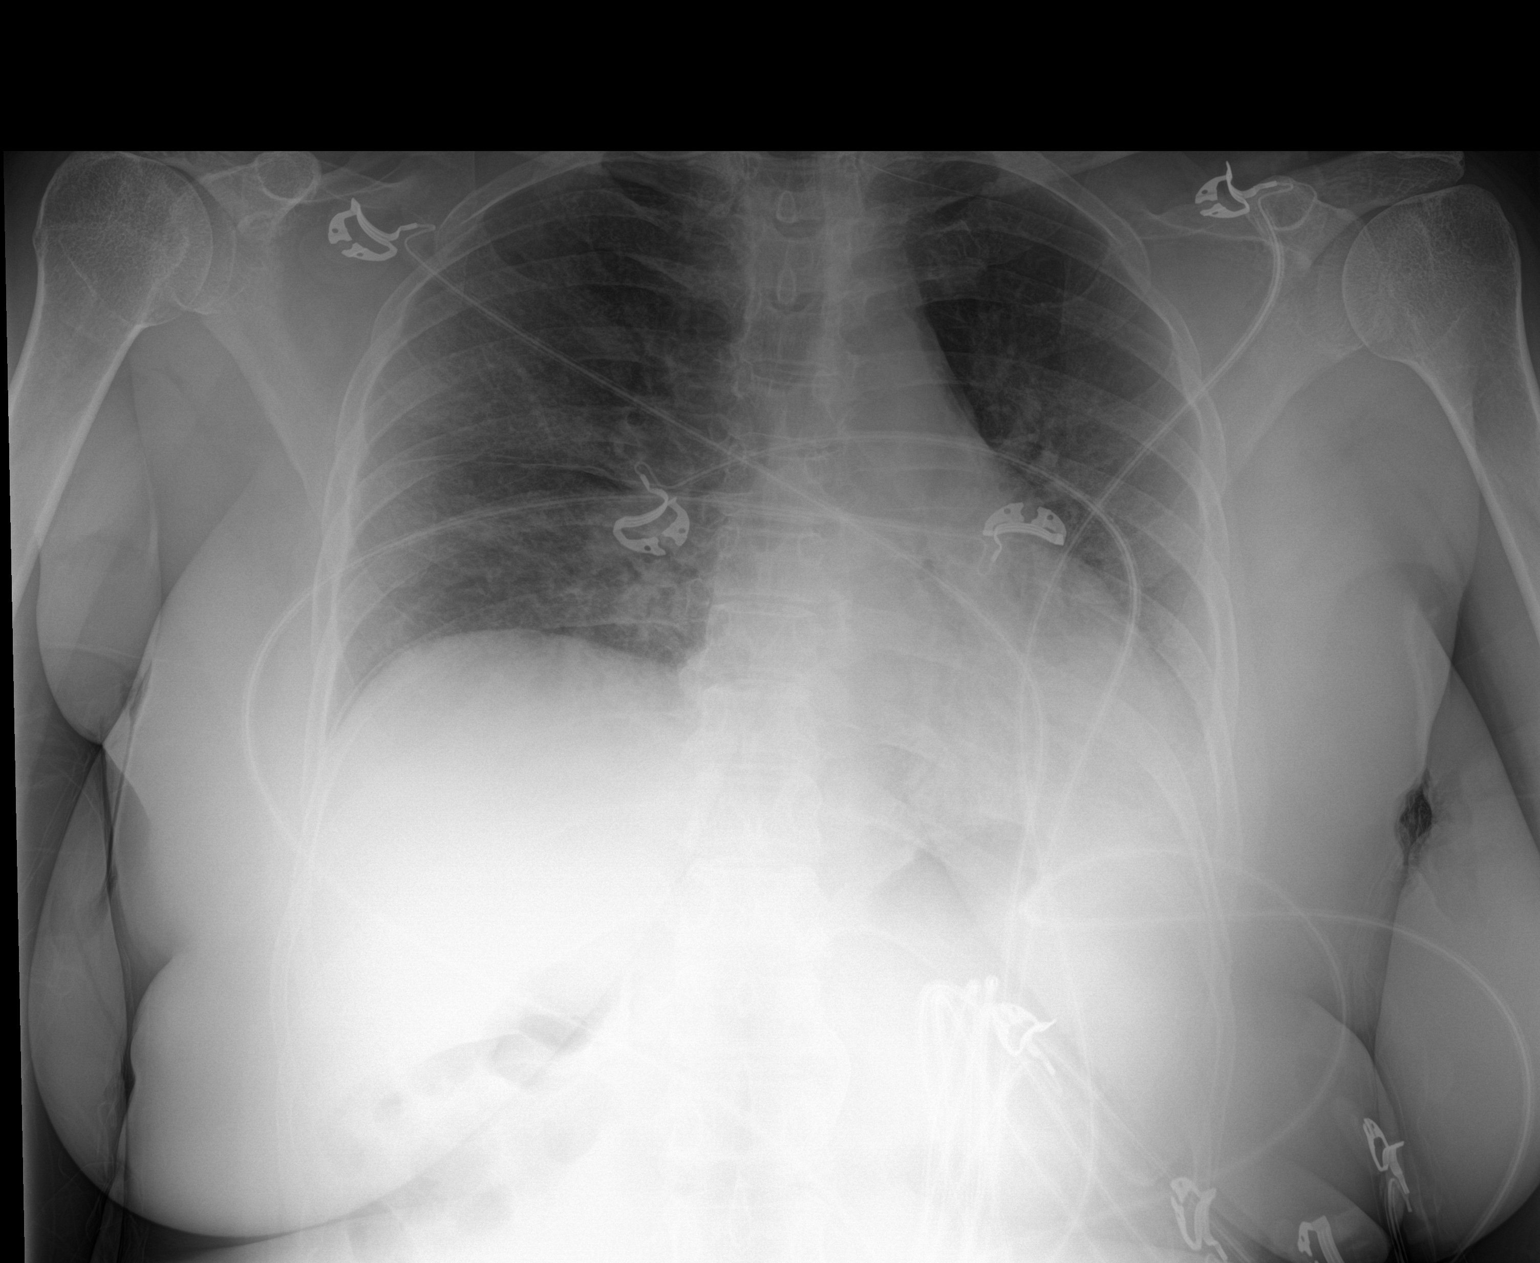

[2 of 2 positions shown; findings below may reference images not displayed]

FINDINGS: The heart size and mediastinal contours are within normal limits.
Both lungs are clear. The visualized skeletal structures are
unremarkable.
IMPRESSION: No active disease.

## 2020-11-13 IMAGING — DX PORTABLE CHEST - 1 VIEW
1 series · 1 of 1 positions shown · non-contrast
Comparison: 11/10/2018

CLINICAL DATA: Follow-up aspiration

EXAM:
PORTABLE CHEST 1 VIEW

[chest ap]
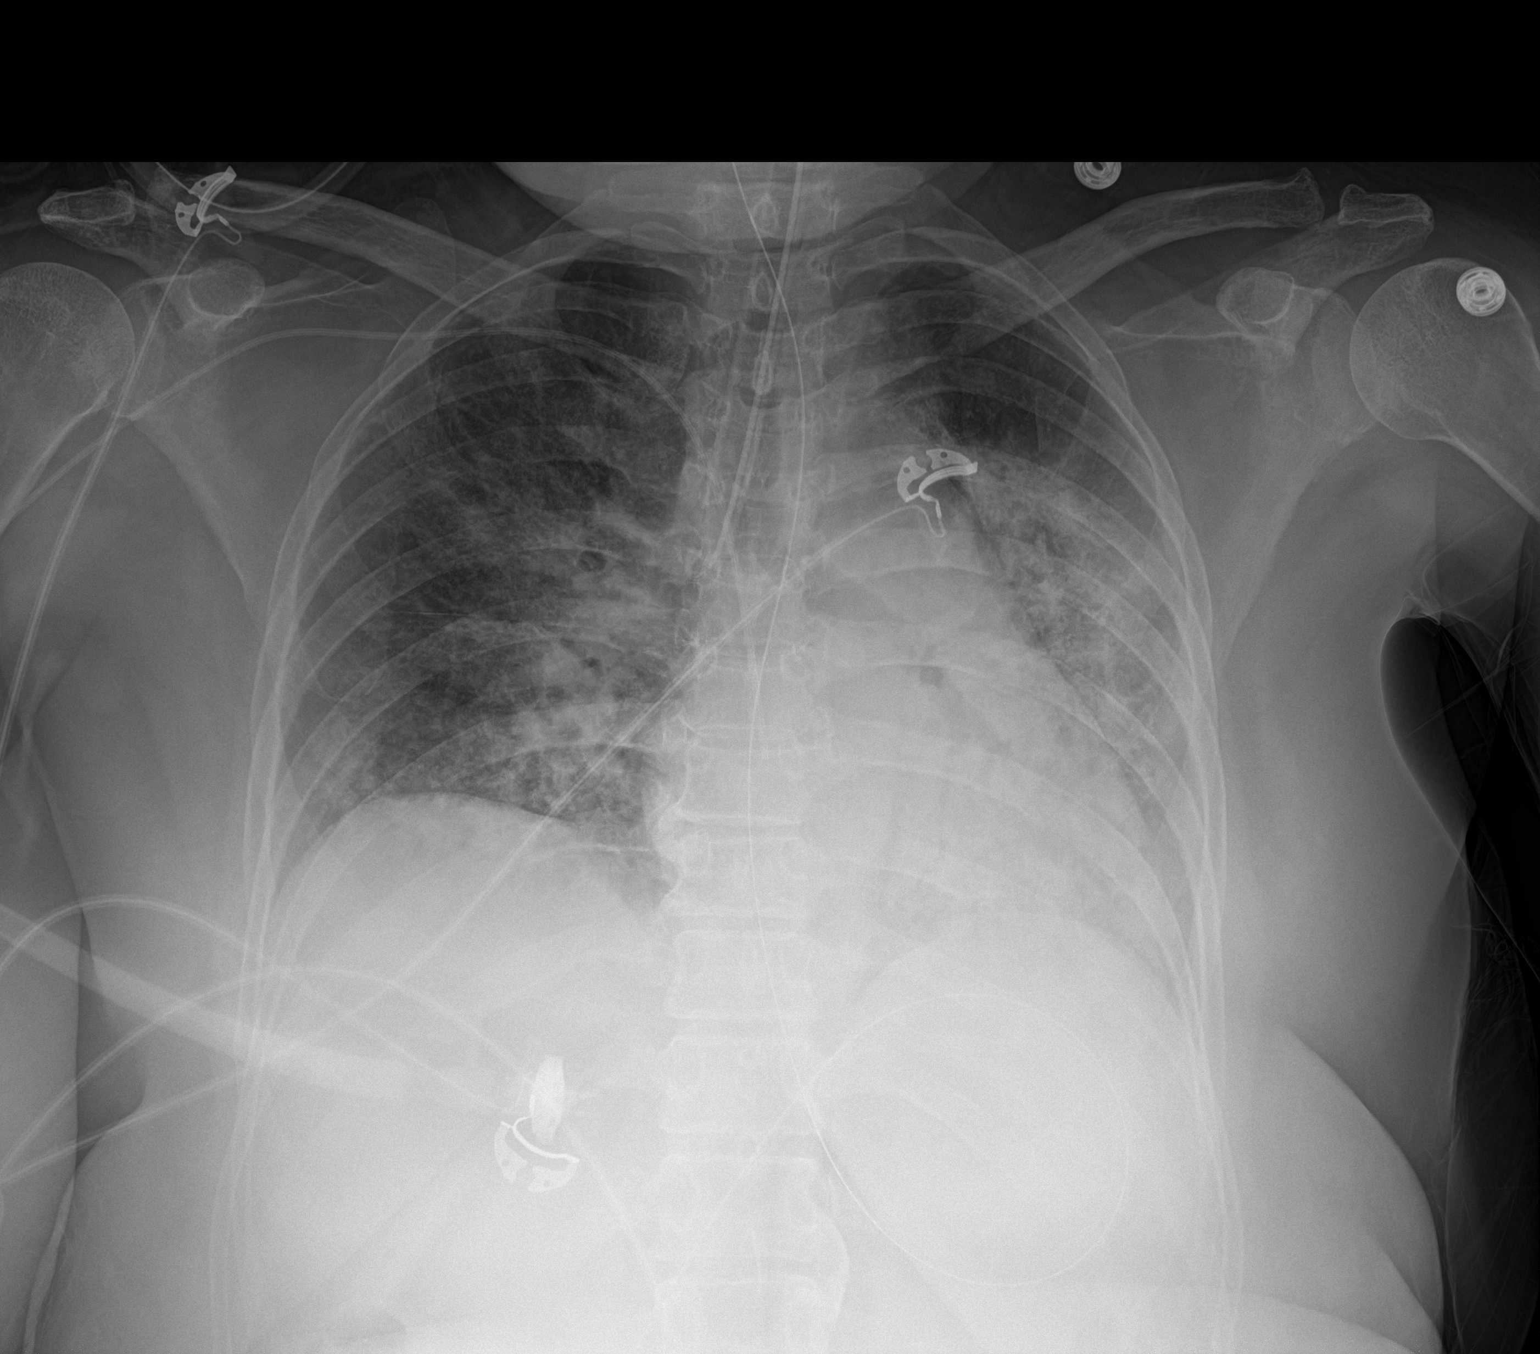

[1 of 1 positions shown; findings below may reference images not displayed]

FINDINGS: Cardiac shadow is stable. Endotracheal tube and gastric catheter are
noted in satisfactory position. Right-sided PICC line is noted at
the cavoatrial junction. Patchy infiltrates are again seen
bilaterally stable from the prior exam. No pneumothorax is noted.
IMPRESSION: No change from the previous day.
